# Patient Record
Sex: Female | Born: 1970 | Race: Black or African American | Hispanic: No | Marital: Married | Smoking: Never smoker
Health system: Southern US, Community
[De-identification: ages and names within clinical notes are randomized; demographics above are authoritative.]

## PROBLEM LIST (undated history)

## (undated) DIAGNOSIS — G4710 Hypersomnia, unspecified: Secondary | ICD-10-CM

## (undated) DIAGNOSIS — Z981 Arthrodesis status: Secondary | ICD-10-CM

## (undated) DIAGNOSIS — N3281 Overactive bladder: Secondary | ICD-10-CM

## (undated) DIAGNOSIS — G47419 Narcolepsy without cataplexy: Secondary | ICD-10-CM

## (undated) DIAGNOSIS — F209 Schizophrenia, unspecified: Secondary | ICD-10-CM

## (undated) DIAGNOSIS — G8929 Other chronic pain: Principal | ICD-10-CM

## (undated) DIAGNOSIS — L309 Dermatitis, unspecified: Secondary | ICD-10-CM

## (undated) DIAGNOSIS — D508 Other iron deficiency anemias: Secondary | ICD-10-CM

## (undated) DIAGNOSIS — J45909 Unspecified asthma, uncomplicated: Secondary | ICD-10-CM

## (undated) DIAGNOSIS — F29 Unspecified psychosis not due to a substance or known physiological condition: Secondary | ICD-10-CM

## (undated) DIAGNOSIS — F319 Bipolar disorder, unspecified: Secondary | ICD-10-CM

## (undated) DIAGNOSIS — F1190 Opioid use, unspecified, uncomplicated: Secondary | ICD-10-CM

## (undated) DIAGNOSIS — F250 Schizoaffective disorder, bipolar type: Secondary | ICD-10-CM

## (undated) DIAGNOSIS — G43909 Migraine, unspecified, not intractable, without status migrainosus: Secondary | ICD-10-CM

## (undated) DIAGNOSIS — K219 Gastro-esophageal reflux disease without esophagitis: Secondary | ICD-10-CM

## (undated) DIAGNOSIS — F429 Obsessive-compulsive disorder, unspecified: Secondary | ICD-10-CM

## (undated) DIAGNOSIS — Z5181 Encounter for therapeutic drug level monitoring: Secondary | ICD-10-CM

## (undated) DIAGNOSIS — M545 Low back pain: Principal | ICD-10-CM

## (undated) DIAGNOSIS — K3184 Gastroparesis: Secondary | ICD-10-CM

## (undated) DIAGNOSIS — M1990 Unspecified osteoarthritis, unspecified site: Secondary | ICD-10-CM

## (undated) DIAGNOSIS — F259 Schizoaffective disorder, unspecified: Secondary | ICD-10-CM

## (undated) DIAGNOSIS — T7840XA Allergy, unspecified, initial encounter: Secondary | ICD-10-CM

## (undated) DIAGNOSIS — F329 Major depressive disorder, single episode, unspecified: Secondary | ICD-10-CM

## (undated) DIAGNOSIS — M75100 Unspecified rotator cuff tear or rupture of unspecified shoulder, not specified as traumatic: Secondary | ICD-10-CM

## (undated) DIAGNOSIS — G2581 Restless legs syndrome: Secondary | ICD-10-CM

## (undated) DIAGNOSIS — I10 Essential (primary) hypertension: Secondary | ICD-10-CM

## (undated) DIAGNOSIS — J302 Other seasonal allergic rhinitis: Secondary | ICD-10-CM

## (undated) DIAGNOSIS — F32A Depression, unspecified: Secondary | ICD-10-CM

## (undated) DIAGNOSIS — G4730 Sleep apnea, unspecified: Secondary | ICD-10-CM

## (undated) DIAGNOSIS — F419 Anxiety disorder, unspecified: Secondary | ICD-10-CM

## (undated) HISTORY — PX: WISDOM TOOTH EXTRACTION: SHX21

## (undated) HISTORY — DX: Bipolar disorder, unspecified: F31.9

## (undated) HISTORY — DX: Unspecified rotator cuff tear or rupture of unspecified shoulder, not specified as traumatic: M75.100

## (undated) HISTORY — DX: Dermatitis, unspecified: L30.9

## (undated) HISTORY — DX: Schizoaffective disorder, bipolar type: F25.0

## (undated) HISTORY — DX: Schizoaffective disorder, unspecified: F25.9

## (undated) HISTORY — DX: Depression, unspecified: F32.A

## (undated) HISTORY — DX: Obsessive-compulsive disorder, unspecified: F42.9

## (undated) HISTORY — DX: Low back pain: M54.5

## (undated) HISTORY — DX: Narcolepsy without cataplexy: G47.419

## (undated) HISTORY — DX: Restless legs syndrome: G25.81

## (undated) HISTORY — PX: FRACTURE SURGERY: SHX138

## (undated) HISTORY — PX: SPINE SURGERY: SHX786

## (undated) HISTORY — PX: ROTATOR CUFF REPAIR: SHX139

## (undated) HISTORY — DX: Hypersomnia, unspecified: G47.10

## (undated) HISTORY — DX: Overactive bladder: N32.81

## (undated) HISTORY — DX: Encounter for therapeutic drug level monitoring: Z51.81

## (undated) HISTORY — DX: Unspecified osteoarthritis, unspecified site: M19.90

## (undated) HISTORY — DX: Gastro-esophageal reflux disease without esophagitis: K21.9

## (undated) HISTORY — DX: Migraine, unspecified, not intractable, without status migrainosus: G43.909

## (undated) HISTORY — DX: Essential (primary) hypertension: I10

## (undated) HISTORY — DX: Major depressive disorder, single episode, unspecified: F32.9

## (undated) HISTORY — PX: ANKLE FUSION: SHX881

## (undated) HISTORY — DX: Other chronic pain: G89.29

## (undated) HISTORY — DX: Opioid use, unspecified, uncomplicated: F11.90

## (undated) HISTORY — DX: Unspecified psychosis not due to a substance or known physiological condition: F29

## (undated) HISTORY — DX: Arthrodesis status: Z98.1

## (undated) HISTORY — DX: Allergy, unspecified, initial encounter: T78.40XA

## (undated) HISTORY — PX: SHOULDER SURGERY: SHX246

## (undated) HISTORY — DX: Anxiety disorder, unspecified: F41.9

## (undated) HISTORY — DX: Other iron deficiency anemias: D50.8

---

## 1998-04-28 ENCOUNTER — Inpatient Hospital Stay (HOSPITAL_COMMUNITY): Admission: AD | Admit: 1998-04-28 | Discharge: 1998-04-30 | Payer: Self-pay | Admitting: Obstetrics & Gynecology

## 1998-10-01 ENCOUNTER — Encounter: Payer: Self-pay | Admitting: Emergency Medicine

## 1998-10-01 ENCOUNTER — Encounter: Payer: Self-pay | Admitting: Orthopedic Surgery

## 1998-10-01 ENCOUNTER — Observation Stay (HOSPITAL_COMMUNITY): Admission: EM | Admit: 1998-10-01 | Discharge: 1998-10-03 | Payer: Self-pay | Admitting: Emergency Medicine

## 1999-04-07 ENCOUNTER — Encounter: Admission: RE | Admit: 1999-04-07 | Discharge: 1999-07-06 | Payer: Self-pay | Admitting: Obstetrics and Gynecology

## 1999-05-31 ENCOUNTER — Encounter (INDEPENDENT_AMBULATORY_CARE_PROVIDER_SITE_OTHER): Payer: Self-pay | Admitting: Specialist

## 1999-05-31 ENCOUNTER — Inpatient Hospital Stay (HOSPITAL_COMMUNITY): Admission: AD | Admit: 1999-05-31 | Discharge: 1999-06-02 | Payer: Self-pay | Admitting: Obstetrics & Gynecology

## 1999-07-01 ENCOUNTER — Other Ambulatory Visit: Admission: RE | Admit: 1999-07-01 | Discharge: 1999-07-01 | Payer: Self-pay | Admitting: Obstetrics and Gynecology

## 1999-09-07 HISTORY — PX: ABDOMINAL HYSTERECTOMY: SHX81

## 1999-09-07 HISTORY — PX: VESICOVAGINAL FISTULA CLOSURE W/ TAH: SUR271

## 2000-07-01 ENCOUNTER — Other Ambulatory Visit: Admission: RE | Admit: 2000-07-01 | Discharge: 2000-07-01 | Payer: Self-pay | Admitting: Obstetrics & Gynecology

## 2000-07-06 ENCOUNTER — Encounter: Payer: Self-pay | Admitting: Family Medicine

## 2000-07-06 ENCOUNTER — Encounter: Admission: RE | Admit: 2000-07-06 | Discharge: 2000-07-06 | Payer: Self-pay | Admitting: Family Medicine

## 2000-08-08 ENCOUNTER — Encounter (INDEPENDENT_AMBULATORY_CARE_PROVIDER_SITE_OTHER): Payer: Self-pay | Admitting: Specialist

## 2000-08-08 ENCOUNTER — Observation Stay (HOSPITAL_COMMUNITY): Admission: RE | Admit: 2000-08-08 | Discharge: 2000-08-09 | Payer: Self-pay | Admitting: Obstetrics & Gynecology

## 2001-10-18 ENCOUNTER — Ambulatory Visit (HOSPITAL_COMMUNITY): Admission: RE | Admit: 2001-10-18 | Discharge: 2001-10-18 | Payer: Self-pay | Admitting: Gastroenterology

## 2002-09-06 HISTORY — PX: TUBAL LIGATION: SHX77

## 2002-09-06 HISTORY — PX: OOPHORECTOMY: SHX86

## 2003-05-27 ENCOUNTER — Encounter: Admission: RE | Admit: 2003-05-27 | Discharge: 2003-05-27 | Payer: Self-pay | Admitting: Gastroenterology

## 2003-05-27 ENCOUNTER — Encounter: Payer: Self-pay | Admitting: Gastroenterology

## 2003-06-25 ENCOUNTER — Encounter: Payer: Self-pay | Admitting: Emergency Medicine

## 2003-06-25 ENCOUNTER — Emergency Department (HOSPITAL_COMMUNITY): Admission: EM | Admit: 2003-06-25 | Discharge: 2003-06-25 | Payer: Self-pay | Admitting: Emergency Medicine

## 2003-07-03 ENCOUNTER — Encounter: Admission: RE | Admit: 2003-07-03 | Discharge: 2003-07-03 | Payer: Self-pay | Admitting: Neurology

## 2004-01-19 ENCOUNTER — Encounter: Admission: RE | Admit: 2004-01-19 | Discharge: 2004-01-19 | Payer: Self-pay | Admitting: Neurology

## 2004-11-24 ENCOUNTER — Encounter (INDEPENDENT_AMBULATORY_CARE_PROVIDER_SITE_OTHER): Payer: Self-pay | Admitting: Specialist

## 2004-11-24 ENCOUNTER — Ambulatory Visit (HOSPITAL_COMMUNITY): Admission: RE | Admit: 2004-11-24 | Discharge: 2004-11-24 | Payer: Self-pay | Admitting: Obstetrics & Gynecology

## 2006-10-19 ENCOUNTER — Encounter: Admission: RE | Admit: 2006-10-19 | Discharge: 2006-10-19 | Payer: Self-pay | Admitting: Physician Assistant

## 2007-07-17 ENCOUNTER — Encounter: Admission: RE | Admit: 2007-07-17 | Discharge: 2007-09-06 | Payer: Self-pay | Admitting: Orthopedic Surgery

## 2007-09-07 HISTORY — PX: OTHER SURGICAL HISTORY: SHX169

## 2007-09-08 ENCOUNTER — Encounter: Admission: RE | Admit: 2007-09-08 | Discharge: 2007-10-30 | Payer: Self-pay | Admitting: Orthopedic Surgery

## 2007-09-14 ENCOUNTER — Encounter: Admission: RE | Admit: 2007-09-14 | Discharge: 2007-10-30 | Payer: Self-pay | Admitting: Orthopedic Surgery

## 2007-11-22 ENCOUNTER — Encounter (INDEPENDENT_AMBULATORY_CARE_PROVIDER_SITE_OTHER): Payer: Self-pay | Admitting: Orthopedic Surgery

## 2007-11-23 ENCOUNTER — Inpatient Hospital Stay (HOSPITAL_COMMUNITY): Admission: RE | Admit: 2007-11-23 | Discharge: 2007-11-24 | Payer: Self-pay | Admitting: Orthopedic Surgery

## 2008-02-23 ENCOUNTER — Encounter: Admission: RE | Admit: 2008-02-23 | Discharge: 2008-02-23 | Payer: Self-pay | Admitting: Family Medicine

## 2008-05-12 ENCOUNTER — Encounter: Admission: RE | Admit: 2008-05-12 | Discharge: 2008-05-12 | Payer: Self-pay | Admitting: Sports Medicine

## 2008-07-02 ENCOUNTER — Encounter: Admission: RE | Admit: 2008-07-02 | Discharge: 2008-07-02 | Payer: Self-pay | Admitting: Orthopedic Surgery

## 2008-08-16 ENCOUNTER — Encounter: Admission: RE | Admit: 2008-08-16 | Discharge: 2008-09-05 | Payer: Self-pay | Admitting: Orthopedic Surgery

## 2008-09-10 ENCOUNTER — Encounter: Admission: RE | Admit: 2008-09-10 | Discharge: 2008-10-03 | Payer: Self-pay | Admitting: Orthopedic Surgery

## 2009-08-05 ENCOUNTER — Encounter: Admission: RE | Admit: 2009-08-05 | Discharge: 2009-08-05 | Payer: Self-pay | Admitting: Family Medicine

## 2009-09-06 DIAGNOSIS — Z981 Arthrodesis status: Secondary | ICD-10-CM

## 2009-09-06 HISTORY — DX: Arthrodesis status: Z98.1

## 2009-09-06 HISTORY — PX: LUMBAR FUSION: SHX111

## 2009-09-17 ENCOUNTER — Encounter: Admission: RE | Admit: 2009-09-17 | Discharge: 2009-09-17 | Payer: Self-pay | Admitting: Orthopedic Surgery

## 2009-10-30 ENCOUNTER — Ambulatory Visit: Payer: Self-pay | Admitting: Vascular Surgery

## 2009-11-12 ENCOUNTER — Inpatient Hospital Stay (HOSPITAL_COMMUNITY): Admission: RE | Admit: 2009-11-12 | Discharge: 2009-11-16 | Payer: Self-pay | Admitting: Orthopedic Surgery

## 2009-11-12 ENCOUNTER — Ambulatory Visit: Payer: Self-pay | Admitting: Surgery

## 2009-11-13 ENCOUNTER — Encounter (INDEPENDENT_AMBULATORY_CARE_PROVIDER_SITE_OTHER): Payer: Self-pay | Admitting: Orthopedic Surgery

## 2009-12-03 ENCOUNTER — Ambulatory Visit: Payer: Self-pay | Admitting: Vascular Surgery

## 2010-03-23 ENCOUNTER — Ambulatory Visit: Payer: Self-pay | Admitting: Vascular Surgery

## 2010-06-05 ENCOUNTER — Encounter (HOSPITAL_COMMUNITY): Admission: RE | Admit: 2010-06-05 | Discharge: 2010-06-12 | Payer: Self-pay | Admitting: Internal Medicine

## 2010-06-08 ENCOUNTER — Emergency Department (HOSPITAL_COMMUNITY): Admission: EM | Admit: 2010-06-08 | Discharge: 2010-06-08 | Payer: Self-pay | Admitting: Emergency Medicine

## 2010-06-29 ENCOUNTER — Ambulatory Visit (HOSPITAL_COMMUNITY): Admission: RE | Admit: 2010-06-29 | Discharge: 2010-06-29 | Payer: Self-pay | Admitting: Surgery

## 2010-06-30 ENCOUNTER — Ambulatory Visit (HOSPITAL_COMMUNITY): Admission: RE | Admit: 2010-06-30 | Discharge: 2010-06-30 | Payer: Self-pay | Admitting: Surgery

## 2010-09-24 ENCOUNTER — Encounter
Admission: RE | Admit: 2010-09-24 | Discharge: 2010-09-24 | Payer: Self-pay | Source: Home / Self Care | Attending: Family Medicine | Admitting: Family Medicine

## 2010-09-27 ENCOUNTER — Encounter: Payer: Self-pay | Admitting: Family Medicine

## 2010-11-30 LAB — CBC
HCT: 36.1 % (ref 36.0–46.0)
Hemoglobin: 12.4 g/dL (ref 12.0–15.0)
MCHC: 34.3 g/dL (ref 30.0–36.0)
MCV: 84.5 fL (ref 78.0–100.0)
RBC: 4.27 MIL/uL (ref 3.87–5.11)

## 2010-11-30 LAB — COMPREHENSIVE METABOLIC PANEL
ALT: 14 U/L (ref 0–35)
CO2: 22 mEq/L (ref 19–32)
Calcium: 8.9 mg/dL (ref 8.4–10.5)
Creatinine, Ser: 0.8 mg/dL (ref 0.4–1.2)
GFR calc non Af Amer: >60 mL/min (ref >60)
Glucose, Bld: 97 mg/dL (ref 70–99)

## 2010-11-30 LAB — TYPE AND SCREEN: ABO/RH(D): O POS

## 2010-11-30 LAB — ABO/RH: ABO/RH(D): O POS

## 2011-01-19 NOTE — Op Note (Signed)
[name], [name]                ACCOUNT NO.:  [id number]   MEDICAL RECORD NO.:  [id number]          PATIENT TYPE:  OIB   LOCATION:  1607                         FACILITY:  [facility]   PHYSICIAN:  Marlowe Kays, M.D.  DATE OF BIRTH:  07/31/1971   DATE OF PROCEDURE:  11/22/2007  DATE OF DISCHARGE:                               OPERATIVE REPORT   PREOPERATIVE DIAGNOSIS:  Herniated nucleus pulposus, L4-5, central and  to the left.   POSTOPERATIVE DIAGNOSIS:  Herniated nucleus pulposus, L4-5, central and  to the left.   OPERATION:  Microdiskectomy, L4-5, left.   SURGEON:  Illene Labrador. Aplington, MD.   Threasa HeadsGeorges Lynch. Gioffre, MD.   ANESTHESIA:  General.   PATHOLOGY AND INDICATIONS FOR PROCEDURE:  She has had chronic left  buttock and leg pain with an MRI demonstrating a central and to the left  disk protrusion almost foraminal in type on the axial projections.  She  has failed to respond to three epidural steroid injections and Lyrica as  a chronic narcotic medication prompting her surgery today.  At surgery,  we did obtain more disk material from the lateral portion of the disk  compatible with a foraminal disk problem.  There will be an element of  lateral recess stenosis as well.   PROCEDURE:  Prophylactic antibiotics, satisfactory general anesthesia,  knee-chest position on the [place] frame.  Back was prepped with  DuraPrep and draped in a sterile field, a timeout performed.  We used  three spinal needles on the lateral x-ray, I tentatively located the L4-  5 interspace, Ioban employed, vertical midline incision.  She was 5 feet  3, 236 pounds making dissection more difficult than normal, also she was  somewhat of a free bleeder, she was not on aspirin, which also  complicated the exposure.  When I had worked my way down to the spinous  processes, I tagged one to place a Penfield-4 instrument in the adjacent  interspace and we tentatively located the Kocher clamp as being  on L4  and the Penfield-4 pointed at the L4-5 interspace.  Again, dissecting  additional soft tissue off the lamina of what we perceived as L4 and L5  and placed a self-retaining McCullough retractor.  Even with the longest  foot available, retraction was a little incomplete, but we did have  adequate working room.  To be certain of our location, we took one final  x-ray with a Penfield-4 instrument in the unilateral space in what we  thought was L4-5 and this was confirmed on the x-ray.  With a small  curette, I undermined the superior portion of L5 and the inferior  portion of L4 and then used a combination of 2 and 3 mm Kerrison  rongeurs to remove ligamentum flavum and bone, midway through this  dissection I brought in a microscope for the finer decompression, the L5  nerve root was identified and appeared to be quite tethered, we  continued working laterally and superiorly until we found the L4-5 disk,  which had a large venous plexus around it, which may also be  and a  contributor to the pathology, particularly when she would stand.  Veins  were cauterized with bipolar cautery, the disk was opened with a 15  knife blade, and we simply retrieved a moderate amount of disk material  with Epstein curette and a pituitary, particularly laterally, in keeping  with what appeared somewhat of a foraminal disk on the axial view.  When  I had removed all disk material obtained from the interspace, I tested  with a hockey-stick and the L3-4 foramen was widely patent as was the L4-  5, and the L5 nerve root freely movable.  There was no unusual bleeding  at closure.  The wound was irrigated with sterile saline and Gelfoam  soaked in thrombin was placed in the bed of the wound, around the dura,  the interspace and nerve root.  I carefully removed the self-retaining  retractors, some minimal bleeders were coagulated, and for closure of  the wound I used #1 Vicryl in the fascia leaving several  centimeters  distally for egress of any bleeding, the subcutaneous tissue was closed  with a combination of #1 and #2-0 Vicryl, staples to the skin, Betadine,  Adaptic, dry sterile dressing were applied, she tolerated the procedure  well and was taken to the recovery room in satisfactory condition with  no known complications.           ______________________________  Marlowe Kays, M.D.     JA/MEDQ  D:  11/22/2007  T:  11/22/2007  Job:  [id number]

## 2011-01-19 NOTE — Discharge Summary (Signed)
[name], [name]                ACCOUNT NO.:  [id number]   MEDICAL RECORD NO.:  [id number]          PATIENT TYPE:  INP   LOCATION:  1607                         FACILITY:  [facility]   PHYSICIAN:  Marlowe Kays, M.D.  DATE OF BIRTH:  April 23, 1971   DATE OF ADMISSION:  11/22/2007  DATE OF DISCHARGE:  11/24/2007                               DISCHARGE SUMMARY   ADMITTING DIAGNOSES:  1. Herniated nucleus pulposus L4-5 on the left.  2. Hypertension.  3. Allergies.  4. Gastroesophageal reflux disease.   DISCHARGE DIAGNOSES:  1. Herniated nucleus pulposus L4-5 on the left.  2. Hypertension.  3. Allergies.  4. Gastroesophageal reflux disease.   OPERATION:  On November 22, 2007, the patient underwent microdiskectomy, L4-  5 on the left.   BRIEF HISTORY:  This 40 year old black female was seen by [place] for  continued progressive problems concerning pain into her low back with  radiation into the left lower extremity.  She had a considerable amount  of pain and discomfort in the left lower extremity.  After risks and  benefits of surgery, the patient underwent the above procedure.   COURSE IN THE HOSPITAL:  The patient tolerated the surgical procedure  quite well.  She was very slow in early ambulation due to the soreness  in her low back.  She is a very heavy lady and was having some  difficulty coming to a standing position.  A walker was supplied and she  did better.  It was decided to keep her another day for her own safety  and observation.  She did have a moderate amount of drainage from her  dressing.  The dressing was changed.  The wound was dry and dressing  applied, stayed dry postoperatively.  She had good strength in the left  lower extremity and had less pain postoperatively than she had  preoperatively.  She did have transient discomfort in the left lower  extremity.  She was essentially afebrile.  Once she was weaned from her  IV antibiotics as well as IV analgesics, it was  decided she could be  maintained in her home environment.  Physical therapy agreed that she  would be safe.  She received instructions as well as a booklet on the  care of her back.   Laboratory values in the hospital showed a hemoglobin of 12.0,  hematocrit of 35.4.  chest x-ray showed no active cardiopulmonary  disease.  Electrocardiogram showed sinus bradycardia, otherwise normal  ECG.   CONDITION ON DISCHARGE:  Improved, stable.   PLAN:  She is to continue with the same diet she had at home; however,  we do recommend a weight reduction diet.  Dr. Simonne Come spoke with her  on the day of discharge concerning that.  Return to see [place] 2 weeks after  date of surgery.  Use a dry dressing as needed.   Continue with her home medications, which are:  1. Seroquel XR 400 mg 1 p.m.  2. Wellbutrin 300 mg q.a.m.  3. Vesicare 5 mg q.a.m.  4. Topamax 200 mg nightly.  5. Hydrochlorothiazide 25 mg  q.a.m.  6. Omeprazole 40 mg b.i.d.  7. Hydrocodone p.r.n.  8. Relpax 40 mg p.r.n. migraine.  9. Trazodone 100 mg as needed.  10.Lyrica 75 mg one daily.  11.We wrote for pain Percocet 5/325 mg #40, 1-2 q.4-6h. p.r.n. pain.  12.Robaxin 500 mg #30 with three refills, one q.6 h. p.r.n. muscle      spasm.   She may shower 4 days after surgery, use dry dressings p.r.n.  Return to  see Dr. Simonne Come in 2 weeks.      Dooley L. Cherlynn June.    ______________________________  Marlowe Kays, M.D.    DLU/MEDQ  D:  11/24/2007  T:  11/24/2007  Job:  [id number]

## 2011-01-19 NOTE — Consult Note (Signed)
NEW PATIENT CONSULTATION   [name], [name]  DOB:  Nov 25, 1970                                       10/30/2009  QIONG#:[id number]   I saw the patient in the office today in consultation concerning her  upcoming ALIF procedure by Dr. Shon Baton.  This is a pleasant 40 year old  woman who has a long history of low back pain.  She had a ruptured disk  in 2009 and underwent repair via a posterior approach.  She initially  did well for several months but then her pain gradually returned and she  has had significant low back pain for years.  Her pain is exacerbated by  any activity at all.  She has tried multiple things to relieve her pain  including physical therapy, pain medicines, Lyrica, without any success.  She has undergone an extensive workup by Dr. Shon Baton and he has  recommended anterior lumbar interbody fusion at the L5-S1 level.  We  have been asked to provide anterior retroperitoneal exposure.   PAST MEDICAL HISTORY:  Is significant for hypertension which has been  well-controlled on her current medications.  In addition, she has  hypercholesterolemia which has also been well-controlled by her primary  care physician, Dr. Clovis Riley.  She denies any history of diabetes,  history of previous myocardial infarction, history of congestive heart  failure or history of COPD.   PAST SURGICAL HISTORY:  Significant for previous hysterectomy and  removal of her right ovary and fallopian tube both of which were done  laparoscopically.  She has also had previous surgery on her right ankle.   FAMILY HISTORY:  There is no history of premature cardiovascular  disease.   SOCIAL HISTORY:  She is married.  She has three children.  She does not  smoke tobacco.   MEDICATIONS:  Are documented on the medical history form in her chart.   ALLERGIES:  She has no allergies except that aspirin causes a GI upset.   REVIEW OF SYSTEMS:  GENERAL:  She has had some weight gain and she  is  214 pounds and 5 feet 2 inches tall.  She has had no fever or chills.  CARDIOVASCULAR:  She has had no chest pain, chest pressure, palpitations  or arrhythmias.  She has had no claudication, rest pain or nonhealing  ulcers.  She has had no history of stroke, TIAs.  She has had no history  of DVT or phlebitis.  PULMONARY:  She has had no productive cough, bronchitis, asthma or  wheezing.  GI:  She has had reflux in the past.  She has had no recent change in  her bowel habits.  GU:  She has had occasional urinary frequency.  NEUROLOGIC:  She has occasional dizziness and occasional headaches.  She  has had no blackouts or seizures.  MUSCULOSKELETAL:  She has had some muscle pain and arthritic pain.  PSYCHIATRIC:  She has had occasional depression.  ENT:  She has had some problems with her vision with respect to blurred  vision.  She has had no change in her hearing.  Hematologic and integumentary review of systems is unremarkable.   PHYSICAL EXAMINATION:  General:  This is a pleasant 40 year old woman  who appears her stated age.  Vital signs:  Her blood pressure is 123/83,  heart rate is 80, respiratory rate 16.  She  is moderately obese.  HEENT:  Unremarkable.  Lungs:  Clear bilaterally to auscultation without rales,  rhonchi or wheezing.  Cardiovascular:  I do not detect any carotid  bruits.  She has a regular rate and rhythm without murmur appreciated.  She has no significant peripheral edema.  She has palpable radial,  femoral and dorsalis pedis pulses bilaterally.  I cannot palpate  posterior tibial pulses.  Abdomen:  Is somewhat obese.  She is nontender  with normal pitched bowel sounds and no masses are appreciated.  Musculoskeletal:  There are no major deformities or cyanosis.  Neurological:  She has no focal weakness or paresthesias.  Skin:  There  are no significant ulcers or rashes.   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  I also  performed an arterial Doppler study in the office today  which shows biphasic Doppler signals in the anterior tibial and  posterior tibial positions bilaterally.   I have explained our role in anterior closure for her upcoming ALIF.  We  have discussed the indications for surgery and the potential  complications including but not limited to bleeding, arterial or venous  injury or thrombosis, leg swelling and nerve injury.  All of her  questions were answered and she is agreeable to proceed.  I think she is  an acceptable risk for this approach despite her obesity but certainly  this does make it more of a challenge and increases her risk slightly.  Dr. Myra Gianotti is actually scheduled to provide exposure for Dr. Shon Baton on  March 9 and the patient is aware of this.  All of her questions were  answered.  She is agreeable to proceed.     Di Kindle. Edilia Bo, M.D.  Electronically Signed   CSD/MEDQ  D:  10/30/2009  T:  10/31/2009  Job:  [id number]   cc:   Alvy Beal, MD  L. Lupe Carney, M.D.

## 2011-01-22 NOTE — Op Note (Signed)
[facility]  Patient:    [name], [name]                         MRN: [id number] Proc. Date: 08/08/00 Attending:  Caralyn Guile. Arlyce Dice, M.D.                           Operative Report  PREOPERATIVE DIAGNOSES:       Menorrhagia, dysmenorrhea, and chronic right lower quadrant pain.  POSTOPERATIVE DIAGNOSIS:      Menorrhagia, dysmenorrhea, and chronic right lower quadrant pain.  OPERATION:                    Diagnostic laparoscopy and total vaginal                               hysterectomy.  SURGEON:                      Richard D. Arlyce Dice, M.D.  ASSISTANT:                    Luvenia Redden, M.D.  ANESTHESIA:                   General endotracheal anesthesia.  ESTIMATED BLOOD LOSS:         100 cc.  FINDINGS:                     Normal-appearing tubes and ovaries.  Uterus appeared normal.  Her uterus was free, top normal size, but otherwise appeared normal.  INDICATIONS:                  A 40 year old gravida 3, para 3, with a one-year history of heavy and painful menstrual periods which were not relieved by conservative measures.  In addition, the patient complained of chronic intermittent right lower quadrant pain.  The options were discussed with the patient preoperatively.  The original surgery that was suggested was a laparoscopy, a D&C, hysteroscopy, and endometrial ablation.  The patient reviewed and requested that we proceed with vaginal hysterectomy for permanent resolution of her vaginal bleeding.  In addition, diagnostic laparoscopy will be performed prior to surgery to ensure that there is no pathology in the right adnexa.  DESCRIPTION OF PROCEDURE:     The patient was taken to the operating room, and general anesthesia was induced.  She was then placed in the dorsolithotomy position.  The abdomen and perineum were prepped and draped in sterile fashion.  A Veress needle was introduced through the umbilicus, and pneumoperitoneum was  created.  A 5 mm scope was introduced into the peritoneal cavity, and the pelvis was viewed with the findings noted above.  Because there was no adnexal pathology, the procedure was then continued vaginally. The cervix was grasped with a Christella Hartigan tenaculum, and the paracervical tissues were infiltrated with 0.5% Marcaine with 1:200,000 epinephrine.  The cervix was then circumcised.  The anterior mucosa was dissected free, and the mucosa was advanced until the peritoneum was seen in the anterior cul-de-sac.  The posterior cul-de-sac was entered sharply.  The uterosacral ligaments were clamped, cut, and ligated bilaterally.  The cardinal ligaments were controlled in a similar fashion.  The anterior cul-de-sac was then entered, and the uterine arteries were clamped, cut, and ligated.  The uterus was  delivered posteriorly.  The utero-ovarian anastomosis was clamped and double ligated. The uterus and cervix were then removed.  The vaginal cuff posteriorly was closed with a running interlocking Vicryl suture from uterosacral to uterosacral.  The cul-de-sac was then closed with an internal stitch from uterosacral to uterosacral, and the peritoneum was closed with a pursestring suture.  The anterior cuff was closed with a figure-of-eight suture.  The surgeon then regloved.  Pneumoperitoneum was recreated, and the laparoscope was used to ensure there were no bleeding pedicles.  The procedure was then terminated.  The 5 mm ports were then closed with Dermabond and did not require any further suturing.  The procedure was then terminated.  The patient left the operating room in good condition. DD:  08/08/00 TD:  08/08/00 Job: [id number]

## 2011-01-22 NOTE — H&P (Signed)
[facility]  Patient:    [name], [name]                         MRN: [id number] Adm. Date:  08/08/00 Attending:  Caralyn Guile. Arlyce Dice, M.D.                         History and Physical  PREOPERATIVE DIAGNOSIS:       1. Menorrhagia.                               2. Dysmenorrhea.                               3. Chronic right lower quadrant pain.  HISTORY OF PRESENT ILLNESS:   This is a 40 year old, gravida 3, para 3, who has a two to three-year history of chronic right lower quadrant pain which has bothered her intermittently.  In addition, the patient over the last year has had heavy vaginal bleeding which she describes as using 80 pads in a week, tends to control the bleeding with nonsteroidal anti-inflammatory drugs, and oral contraceptives have failed to alleviate the problem.  The options were discussed with the patient which included D&C, hysteroscopy, and endometrial ablation versus vaginal hysterectomy, and the patient opted to have vaginal hysterectomy.  In addition, due to the patients complaint of right lower quadrant pain, the decision was made to proceed with a laparoscopy prior to surgery to ensure that there was no adnexal pathology.  PAST SURGICAL HISTORY:        Postpartum tubal ligation.  PAST MEDICAL HISTORY:         She has no medical problems.  Has never had a blood transfusion.  ALLERGIES:                    No known allergies.  FAMILY HISTORY:               Negative for heart disease, diabetes, and breast cancer.  SOCIAL HISTORY:               She does not use street drugs.  She does not smoke cigarettes.  REVIEW OF SYSTEMS:            Negative in detail.  PHYSICAL EXAMINATION:  HEENT:                        Ears, nose, and throat are normal.  Throat was supple without thyromegaly.  BREASTS:                      Without masses.  CARDIOVASCULAR:               Regular sinus rhythm without murmurs or gallops.  CHEST:                         Lungs are clear.  ABDOMEN:                      Soft without hepatosplenomegaly.  PELVIC:                       Uterus is retroverted, top normal size.  No adnexal masses palpable.  Vagina and vulva are normal.  ADMISSION DIAGNOSES:          1. Menorrhagia.                               2. Dysmenorrhea.                               3. Chronic right lower quadrant pain.  PLAN:                         Laparoscopy followed by vaginal hysterectomy. DD:  08/08/00 TD:  08/08/00 Job: [id number]

## 2011-01-22 NOTE — Procedures (Signed)
[facility]  Patient:    [name], [name] Visit Number: [id number] MRN: [id number]          Service Type: Attending:  Verlin Grills, M.D. Dictated by:   Verlin Grills, M.D. Proc. Date: 10/18/01   CC:         Desma Maxim, M.D.   Procedure Report  PROCEDURE:  Colonoscopy.  REFERRING PHYSICIAN:  Desma Maxim, M.D.  PROCEDURE INDICATION:  [name] is a 40 year old female born Aug 02, 1971.  She has intermittent, painless hematochezia.  I discussed with [name] the complications associated with colonoscopy including intestinal bleeding and intestinal perforation.  [name] has signed the operative permit.  ENDOSCOPIST:  Verlin Grills, M.D.  PREMEDICATION:  Versed 7 mg, Demerol 50 mg.  ENDOSCOPE:  Olympus pediatric colonoscope.  DESCRIPTION OF PROCEDURE:  After obtaining informed consent, [name] was placed in the left lateral decubitus position.  I administered intravenous Demerol and intravenous Versed to achieve conscious sedation for the procedure.  The patients blood pressure, oxygen saturation, and cardiac rhythm were monitored throughout the procedure and documented in the medical record.  Anal inspection was normal.  Digital rectal examination was normal.  The Olympus pediatric video colonoscope was introduced into the rectum and easily advanced to the cecum.  Colonic preparation for the exam today was satisfactory.  RECTUM:  Normal.  Moderate sized but nonbleeding internal hemorrhoids are present.  SIGMOID COLON AND DESCENDING COLON:  Normal.  SPLENIC FLEXURE:  Normal.  TRANSVERSE COLON:  Normal.  HEPATIC FLEXURE:  Normal.  ASCENDING COLON:  Normal.  CECUM AND ILEOCECAL VALVE:  Normal.  ASSESSMENT:  Moderate sized nonbleeding internal hemorrhoids; otherwise normal proctocolonoscopy to the cecum.  I do not see any active bleeding from the anus, rectum, or colon.  I suspect she  probably has intermittent bleeding from internal hemorrhoids which do not appear large enough to require banding or sclerosis.  I will simply prescribe Preparation H suppositories. Dictated by:   Verlin Grills, M.D. Attending:  Verlin Grills, M.D. DD:  10/18/01 TD:  10/18/01 Job: [id number]

## 2011-01-22 NOTE — Op Note (Signed)
[name], [name]         ACCOUNT NO.:  [id number]   MEDICAL RECORD NO.:  [id number]          PATIENT TYPE:  AMB   LOCATION:  SDC                           FACILITY:  [facility]   PHYSICIAN:  Ilda Mori, M.D.   DATE OF BIRTH:  02/17/1971   DATE OF PROCEDURE:  11/24/2004  DATE OF DISCHARGE:                                 OPERATIVE REPORT   PREOPERATIVE DIAGNOSIS:  Chronic right lower quadrant pain.   POSTOPERATIVE DIAGNOSES:  1.  Chronic right lower quadrant pain.  2.  Hemorrhagic right paratubal ovarian cyst.   PROCEDURE:  Laparoscopic right salpingo-oophorectomy.   SURGEON:  Richard D. Arlyce Dice, M.D.   ASSISTANT:  Luvenia Redden, M.D.   ANESTHESIA:  General endotracheal.   ESTIMATED BLOOD LOSS:  Minimal.   FINDINGS:  A 3 cm hemorrhagic inflammatory cyst in the right adnexa.  Normal-  appearing right ovary, normal-appearing left ovary and left tube.  No pelvic  adhesions.   SPECIMENS:  Right tube and ovary sent to pathology.   INDICATIONS:  This is a 40 year old gravida 3, para 3, female with over five  years of chronic right lower quadrant pain.  Diagnostic laparoscopy in  December 2001 that was done at the same time as a vaginal hysterectomy  showed normal-appearing right and left adnexa.  The patient's pain persisted  over the last five years, severe enough at times to interfere with her  normal activities.  Examination of musculoskeletal system was negative.  She  has no significant GI problems. Ultrasound done two weeks ago revealed a  complex mass in the right adnexa and due to the cyst in the right adnexa and  the persistence of the patient's pain, a decision was made to proceed with  right salpingo-oophorectomy.   PROCEDURE:  The patient was brought to the operating room, placed in the  supine position.  General endotracheal anesthesia was induced.  The patient  was then placed in the dorsal lithotomy position and the abdomen and  perineum were prepped and  draped in a sterile fashion and the bladder was  catheterized.  Incision was made in the base of the umbilicus and carried  down to the fascia, which was then opened sharply and tagged with 0 Vicryl  suture.  The peritoneum was then entered sharply and a Hasson cannula was  placed into the peritoneum.  Pneumoperitoneum was created and the  laparoscope was introduced.  Under direct visualization, accessory  instruments were placed through the right and left lower quadrants.  The  pelvis was viewed with the findings noted above.  The infundibulopelvic  ligament was then elevated.  The ureter was noted to be peristalsing  normally well below the infundibulopelvic ligament.  The infundibulopelvic  ligament was ten cauterized and cut and the adnexa were freed.  The  hemostasis was obtained with bipolar cautery.  The Endobag was then placed  through the umbilical port.  A 5 mm scope was placed through the accessory  port in the right lower quadrant.  The specimen was placed in the Endobag  and was removed in the bag through the umbilical incision.  The  pursestring  Vicryl suture that had been placed during the insertion of the cannula was  then tied.  The skin in the umbilical site was closed with subcuticular 4-0  Dexon suture.  The gas was allowed to escape and the lower instruments were  removed, and these incisions were closed with Dermabond.  The patient  tolerated the procedure well and left the operating room in good condition.      RK/MEDQ  D:  11/24/2004  T:  11/24/2004  Job:  [id number]

## 2011-05-31 LAB — BASIC METABOLIC PANEL
BUN: 6
Chloride: 103
Creatinine, Ser: 0.83

## 2011-05-31 LAB — HEMOGLOBIN AND HEMATOCRIT, BLOOD: Hemoglobin: 12

## 2011-06-03 ENCOUNTER — Encounter: Payer: Self-pay | Admitting: Pulmonary Disease

## 2011-06-08 ENCOUNTER — Encounter: Payer: Self-pay | Admitting: Pulmonary Disease

## 2011-06-09 ENCOUNTER — Ambulatory Visit (INDEPENDENT_AMBULATORY_CARE_PROVIDER_SITE_OTHER): Payer: Federal, State, Local not specified - PPO | Admitting: Pulmonary Disease

## 2011-06-09 ENCOUNTER — Other Ambulatory Visit: Payer: Federal, State, Local not specified - PPO

## 2011-06-09 ENCOUNTER — Other Ambulatory Visit: Payer: Self-pay | Admitting: Pulmonary Disease

## 2011-06-09 ENCOUNTER — Encounter: Payer: Self-pay | Admitting: Pulmonary Disease

## 2011-06-09 VITALS — BP 108/82 | HR 95 | Temp 98.6°F | Ht 62.0 in | Wt 221.8 lb

## 2011-06-09 DIAGNOSIS — R0989 Other specified symptoms and signs involving the circulatory and respiratory systems: Secondary | ICD-10-CM

## 2011-06-09 DIAGNOSIS — R0609 Other forms of dyspnea: Secondary | ICD-10-CM

## 2011-06-09 DIAGNOSIS — J309 Allergic rhinitis, unspecified: Secondary | ICD-10-CM

## 2011-06-09 DIAGNOSIS — R0600 Dyspnea, unspecified: Secondary | ICD-10-CM

## 2011-06-09 DIAGNOSIS — R0602 Shortness of breath: Secondary | ICD-10-CM

## 2011-06-09 NOTE — Progress Notes (Signed)
  Subjective:    Patient ID: [name], female    DOB: [birth year], 40 y.o.   MRN: [id number]  HPI 39/F never smoker for evaluation of dyspnea x few months. This is present constantly But sometimes worsened by activity.  She feels her chest heavy, raspy , as if she had inhaled smoke. She denies seasonal allergies or diurnal variation. Taking long deep breaths seems to help. Occasional wheezing as been noted. CXR nml. Spirometry did not show airway obstruction. She has chronic headaches ([facility]), chronic pain on opana (ramos) & psychosis Nolen Mu) . She takes pramipexole for restless legs. She has breakthrough reflux symptoms on omeprazole. She denies sinus congestion.  Symbicort trial did not help  Review of Systems  Constitutional: Negative for fever and unexpected weight change.  HENT: Positive for sore throat and trouble swallowing. Negative for ear pain, nosebleeds, congestion, rhinorrhea, sneezing, dental problem, postnasal drip and sinus pressure.   Eyes: Negative for redness and itching.  Respiratory: Positive for cough and shortness of breath. Negative for chest tightness and wheezing.   Cardiovascular: Positive for palpitations and leg swelling.  Gastrointestinal: Negative for nausea and vomiting.  Genitourinary: Negative for dysuria.  Musculoskeletal: Positive for joint swelling.  Skin: Negative for rash.  Neurological: Positive for headaches.  Hematological: Does not bruise/bleed easily.  Psychiatric/Behavioral: Positive for dysphoric mood. The patient is nervous/anxious.        Objective:   Physical Exam Gen. Pleasant, obese, in no distress, normal affect ENT - no lesions, no post nasal drip, class 2-3 airway, swollen turbinates Neck: No JVD, no thyromegaly, no carotid bruits Lungs: no use of accessory muscles, no dullness to percussion, decreased without rales or rhonchi  Cardiovascular: Rhythm regular, heart sounds  normal, no murmurs or gallops, no  peripheral edema Abdomen: soft and non-tender, no hepatosplenomegaly, BS normal. Musculoskeletal: No deformities, no cyanosis or clubbing Neuro:  alert, non focal, no tremors        Assessment & Plan:

## 2011-06-09 NOTE — Patient Instructions (Signed)
Lung function test was normal Treat for reflux  Allergy blood test - RAST Nasal spray -each nare at bedtime

## 2011-06-11 ENCOUNTER — Telehealth: Payer: Self-pay | Admitting: Pulmonary Disease

## 2011-06-11 LAB — ALLERGY FULL PROFILE

## 2011-06-11 MED ORDER — DEXLANSOPRAZOLE 60 MG PO CPDR: 60.0000 mg | DELAYED_RELEASE_CAPSULE | Freq: Every day | ORAL | Status: DC

## 2011-06-11 NOTE — Telephone Encounter (Signed)
I spoke with pt and she states her reflux medication was not sent to the pharmacy. Per Ch[facility] Heck send Dr. Vassie Loll wanted her on dexilant 60 mg. Pt also states she is running out of her symbicort sample given to her by another physician and wanted to if Dr. Vassie Loll wanted her to continue on this and if so she will need an rx. Pt aware RA is out of the office today. Please advise Dr. Vassie Loll. Thanks  Carver Fila, CMA

## 2011-06-12 NOTE — Assessment & Plan Note (Signed)
Unclear cause - doubt asthma, given lack of response to therapeutic trial & nml spirometry Will treat for GERD with dexilant  - long acting PPI Nasal steroid qhs If no improvement, consider echo for LV/RV function No risk factors for PE here - explore anxiety as cause ?

## 2011-06-12 NOTE — Telephone Encounter (Signed)
OK to stop symbicort once done. dexilant Rx sent ?

## 2011-06-14 NOTE — Telephone Encounter (Signed)
Pt aware to stop the symbicort once done and rx for dexilant was already sent

## 2011-06-24 LAB — ALLERGY FULL PROFILE
Allergen, D pternoyssinus,d7: 0.19 kU/L (ref <0.35)
Allergen,Goose feathers, e70: <0.1 kU/L (ref <0.35)
Alternaria Alternata: <0.1 kU/L (ref <0.35)
Aspergillus fumigatus, IgG: <0.1 kU/L (ref <0.35)
Bahia Grass: <0.1 kU/L (ref <0.35)
Bermuda Grass: <0.1 kU/L (ref <0.35)
Box Elder IgE: <0.1 kU/L (ref <0.35)
Candida Albicans: 0.28 kU/L (ref <0.35)
Cat Dander: 0.4 kU/L — ABNORMAL HIGH (ref <0.35)
Common Ragweed: 0.12 kU/L (ref <0.35)
Curvularia lunata: <0.1 kU/L (ref <0.35)
D. farinae: 0.25 kU/L (ref <0.35)
Dog Dander: <0.1 kU/L (ref <0.35)
Elm IgE: <0.1 kU/L (ref <0.35)
Fescue: <0.1 kU/L (ref <0.35)
G005 Rye, Perennial: <0.1 kU/L (ref <0.35)
G009 Red Top: <0.1 kU/L (ref <0.35)
Goldenrod: <0.1 kU/L (ref <0.35)
Helminthosporium halodes: <0.1 kU/L (ref <0.35)
House Dust Hollister: 0.15 kU/L (ref <0.35)
IgE (Immunoglobulin E), Serum: 79 IU/mL (ref 0.0–180.0)
Lamb's Quarters: <0.1 kU/L (ref <0.35)
Oak: <0.1 kU/L (ref <0.35)
Plantain: <0.1 kU/L (ref <0.35)
Stemphylium Botryosum: <0.1 kU/L (ref <0.35)
Sycamore Tree: 0.11 kU/L (ref <0.35)
Timothy Grass: <0.1 kU/L (ref <0.35)

## 2011-06-28 NOTE — Progress Notes (Signed)
Quick Note:  I informed pt of RA's findings and recommendations. Pt verbalized understanding  ______ 

## 2011-08-04 ENCOUNTER — Other Ambulatory Visit: Payer: Self-pay | Admitting: Pulmonary Disease

## 2011-08-04 MED ORDER — DEXLANSOPRAZOLE 60 MG PO CPDR: 60.0000 mg | DELAYED_RELEASE_CAPSULE | Freq: Every day | ORAL | Status: DC

## 2011-09-03 ENCOUNTER — Telehealth: Payer: Self-pay | Admitting: Pulmonary Disease

## 2011-09-03 NOTE — Telephone Encounter (Signed)
Member ID # [id number]. Dexilant has been APPROVED from 07/04/11 to 09/02/12. Pt has failed Omeprazole which is a covered alternative. Patient and pharmacy notified.

## 2011-10-08 ENCOUNTER — Other Ambulatory Visit: Payer: Self-pay | Admitting: Orthopedic Surgery

## 2011-10-08 DIAGNOSIS — M545 Low back pain, unspecified: Secondary | ICD-10-CM

## 2011-10-11 ENCOUNTER — Ambulatory Visit
Admission: RE | Admit: 2011-10-11 | Discharge: 2011-10-11 | Disposition: A | Discharge status: Home / Self Care | Payer: Federal, State, Local not specified - PPO | Source: Ambulatory Visit | Attending: Orthopedic Surgery | Admitting: Orthopedic Surgery

## 2011-10-11 VITALS — BP 118/64 | HR 74

## 2011-10-11 DIAGNOSIS — M545 Low back pain, unspecified: Secondary | ICD-10-CM

## 2011-10-11 MED ORDER — ONDANSETRON HCL 4 MG/2ML IJ SOLN
4.0000 mg | Freq: Once | INTRAMUSCULAR | Status: AC
  Administered 2011-10-11: 4 mg via INTRAMUSCULAR

## 2011-10-11 MED ORDER — DIAZEPAM 5 MG PO TABS
10.0000 mg | ORAL_TABLET | Freq: Once | ORAL | Status: AC
  Administered 2011-10-11: 10 mg via ORAL

## 2011-10-11 MED ORDER — IOHEXOL 180 MG/ML  SOLN
15.0000 mL | Freq: Once | INTRAMUSCULAR | Status: AC | PRN
  Administered 2011-10-11: 15 mL via INTRATHECAL

## 2011-10-11 MED ORDER — ONDANSETRON HCL 4 MG/2ML IJ SOLN: 4.0000 mg | Freq: Four times a day (QID) | INTRAMUSCULAR | Status: DC | PRN

## 2011-10-11 MED ORDER — MEPERIDINE HCL 100 MG/ML IJ SOLN
75.0000 mg | Freq: Once | INTRAMUSCULAR | Status: AC
  Administered 2011-10-11: 75 mg via INTRAMUSCULAR

## 2011-10-11 NOTE — Progress Notes (Addendum)
Pt is waiting for ride to return. Dd  1411 daughter is just leaving school and is on the way to pick up her Mom.dd

## 2011-11-23 ENCOUNTER — Other Ambulatory Visit: Payer: Self-pay | Admitting: Pulmonary Disease

## 2011-11-23 MED ORDER — DEXLANSOPRAZOLE 60 MG PO CPDR: 60.0000 mg | DELAYED_RELEASE_CAPSULE | Freq: Every day | ORAL | Status: DC

## 2012-03-14 ENCOUNTER — Other Ambulatory Visit: Payer: Self-pay | Admitting: Physician Assistant

## 2012-03-14 DIAGNOSIS — Z1231 Encounter for screening mammogram for malignant neoplasm of breast: Secondary | ICD-10-CM

## 2012-03-28 ENCOUNTER — Ambulatory Visit
Admission: RE | Admit: 2012-03-28 | Discharge: 2012-03-28 | Disposition: A | Discharge status: Home / Self Care | Payer: Federal, State, Local not specified - PPO | Source: Ambulatory Visit | Attending: Physician Assistant | Admitting: Physician Assistant

## 2012-03-28 DIAGNOSIS — Z1231 Encounter for screening mammogram for malignant neoplasm of breast: Secondary | ICD-10-CM

## 2012-04-20 ENCOUNTER — Encounter (HOSPITAL_COMMUNITY): Payer: Self-pay | Admitting: Emergency Medicine

## 2012-04-20 ENCOUNTER — Observation Stay (HOSPITAL_COMMUNITY)
Admission: EM | Admit: 2012-04-20 | Discharge: 2012-04-20 | Disposition: A | Discharge status: Home / Self Care | Payer: Federal, State, Local not specified - PPO | Attending: Emergency Medicine | Admitting: Emergency Medicine

## 2012-04-20 ENCOUNTER — Observation Stay (HOSPITAL_COMMUNITY): Payer: Federal, State, Local not specified - PPO

## 2012-04-20 DIAGNOSIS — R5383 Other fatigue: Secondary | ICD-10-CM | POA: Insufficient documentation

## 2012-04-20 DIAGNOSIS — R42 Dizziness and giddiness: Secondary | ICD-10-CM | POA: Insufficient documentation

## 2012-04-20 DIAGNOSIS — R112 Nausea with vomiting, unspecified: Secondary | ICD-10-CM | POA: Insufficient documentation

## 2012-04-20 DIAGNOSIS — R197 Diarrhea, unspecified: Principal | ICD-10-CM | POA: Insufficient documentation

## 2012-04-20 DIAGNOSIS — R5381 Other malaise: Secondary | ICD-10-CM | POA: Insufficient documentation

## 2012-04-20 DIAGNOSIS — R109 Unspecified abdominal pain: Secondary | ICD-10-CM | POA: Insufficient documentation

## 2012-04-20 DIAGNOSIS — R509 Fever, unspecified: Secondary | ICD-10-CM | POA: Insufficient documentation

## 2012-04-20 DIAGNOSIS — I10 Essential (primary) hypertension: Secondary | ICD-10-CM | POA: Insufficient documentation

## 2012-04-20 LAB — HEPATIC FUNCTION PANEL
ALT: 14 U/L (ref 0–35)
Alkaline Phosphatase: 75 U/L (ref 39–117)
Total Protein: 8.1 g/dL (ref 6.0–8.3)

## 2012-04-20 LAB — BASIC METABOLIC PANEL
BUN: 8 mg/dL (ref 6–23)
CO2: 26 mEq/L (ref 19–32)
Chloride: 101 mEq/L (ref 96–112)
Creatinine, Ser: 0.71 mg/dL (ref 0.50–1.10)

## 2012-04-20 LAB — CBC
HCT: 38 % (ref 36.0–46.0)
MCV: 77.9 fL — ABNORMAL LOW (ref 78.0–100.0)
RBC: 4.88 MIL/uL (ref 3.87–5.11)
WBC: 17.3 10*3/uL — ABNORMAL HIGH (ref 4.0–10.5)

## 2012-04-20 MED ORDER — ONDANSETRON 8 MG PO TBDP: 8.0000 mg | ORAL_TABLET | Freq: Three times a day (TID) | ORAL | Status: AC | PRN

## 2012-04-20 MED ORDER — SODIUM CHLORIDE 0.9 % IV SOLN: Freq: Once | INTRAVENOUS | Status: DC

## 2012-04-20 MED ORDER — IOHEXOL 300 MG/ML  SOLN
100.0000 mL | Freq: Once | INTRAMUSCULAR | Status: AC | PRN
  Administered 2012-04-20: 100 mL via INTRAVENOUS

## 2012-04-20 MED ORDER — SODIUM CHLORIDE 0.9 % IV BOLUS (SEPSIS)
1000.0000 mL | Freq: Once | INTRAVENOUS | Status: AC
  Administered 2012-04-20: 1000 mL via INTRAVENOUS

## 2012-04-20 MED ORDER — LORAZEPAM 2 MG/ML IJ SOLN
1.0000 mg | Freq: Once | INTRAMUSCULAR | Status: AC
  Administered 2012-04-20: 1 mg via INTRAVENOUS
  Filled 2012-04-20: qty 1

## 2012-04-20 MED ORDER — PROMETHAZINE HCL 25 MG/ML IJ SOLN
25.0000 mg | Freq: Once | INTRAMUSCULAR | Status: AC
  Administered 2012-04-20: 25 mg via INTRAVENOUS
  Filled 2012-04-20: qty 1

## 2012-04-20 MED ORDER — ONDANSETRON 8 MG/NS 50 ML IVPB: 8.0000 mg | Freq: Once | INTRAVENOUS | Status: DC

## 2012-04-20 MED ORDER — ONDANSETRON 8 MG/NS 50 ML IVPB
8.0000 mg | INTRAVENOUS | Status: AC
  Administered 2012-04-20: 8 mg via INTRAVENOUS
  Filled 2012-04-20: qty 8

## 2012-04-20 MED ORDER — ACETAMINOPHEN 325 MG PO TABS
650.0000 mg | ORAL_TABLET | Freq: Once | ORAL | Status: AC
  Administered 2012-04-20: 650 mg via ORAL
  Filled 2012-04-20: qty 2

## 2012-04-20 MED ORDER — PANTOPRAZOLE SODIUM 40 MG IV SOLR
40.0000 mg | Freq: Once | INTRAVENOUS | Status: AC
  Administered 2012-04-20: 40 mg via INTRAVENOUS
  Filled 2012-04-20: qty 40

## 2012-04-20 MED ORDER — IOHEXOL 300 MG/ML  SOLN: 20.0000 mL | INTRAMUSCULAR | Status: AC

## 2012-04-20 NOTE — ED Notes (Signed)
Patient given ice chips. States she is still dry heaving and is also complaining of headache. Offered tylenol. States she wants to hold off on tylenol. Will continue to monitor patient

## 2012-04-20 NOTE — ED Notes (Signed)
Pt here with body aches and n/v/d that started last night

## 2012-04-20 NOTE — ED Provider Notes (Signed)
Patient placed in CDU pending completion of diagnostic testing in the evaluation of N/V/D and abdominal pain with elevated white count.  Patient resting/sleeping upon entering room, easily aroused, vomiting currently controlled.  CT results discussed with patient, no acute abnormality noted.    2315:  After IV fluids and medications, patient feeling better, tolerating po fluids without difficulty.  No additional episodes of emesis or diarrhea since 1530.  Patient reports she feels like she is ready to return home.  Jimmye Norman, NP 04/20/12 [phone]

## 2012-04-20 NOTE — ED Provider Notes (Signed)
History     CSN: [id number]  Arrival date & time 04/20/12  1018   First MD Initiated Contact with Patient 04/20/12 1046      Chief Complaint  Patient presents with  . Diarrhea    (Consider location/radiation/quality/duration/timing/severity/associated sxs/prior treatment) HPI Comments: Patient reports nausea, vomiting, and diarrhea that started last night. She reports eating at [facility] yesterday and after which her symptoms started. She reports associated weakness and body aches. She has not tried anything to make her feel better. She has vomited and had diarrhea "countless times" since her symptoms started. No one around her is sick. She admits to fever, chills, and associated abdominal pain. She denies chest pain, SOB, headache.   Patient is a 41 y.o. female presenting with diarrhea.  Diarrhea The primary symptoms include fever, fatigue, abdominal pain, nausea, vomiting, diarrhea and myalgias. Primary symptoms do not include dysuria or rash.  The illness is also significant for chills. The illness does not include constipation or back pain.    Past Medical History  Diagnosis Date  . Hypertension   . Overactive bladder   . Esophageal reflux   . Migraine   . Psychosis   . Schizophrenia, schizo-affective   . OCD (obsessive compulsive disorder)   . Bipolar 1 disorder   . Osteoarthritis   . Restless leg syndrome     Past Surgical History  Procedure Date  . Neck surgery 2009, 2011  . Vesicovaginal fistula closure w/ tah 2001  . Tubal ligation 2004  . Shoulder surgery     left, bone spurs, torn muscle    History reviewed. No pertinent family history.  History  Substance Use Topics  . Smoking status: Never Smoker   . Smokeless tobacco: Never Used  . Alcohol Use: Yes     occ    OB History    Grav Para Term Preterm Abortions TAB SAB Ect Mult Living                  Review of Systems  Constitutional: Positive for fever, chills and fatigue. Negative for  diaphoresis.  HENT: Negative for sore throat and trouble swallowing.   Eyes: Negative for photophobia and visual disturbance.  Respiratory: Negative for cough, chest tightness and shortness of breath.   Cardiovascular: Negative for chest pain.  Gastrointestinal: Positive for nausea, vomiting, abdominal pain and diarrhea. Negative for constipation.  Genitourinary: Negative for dysuria.  Musculoskeletal: Positive for myalgias. Negative for back pain.  Skin: Negative for pallor and rash.  Neurological: Positive for light-headedness. Negative for dizziness and headaches.    Allergies  Aspirin; Benadryl; Codeine; and Prednisone  Home Medications   Current Outpatient Rx  Name Route Sig Dispense Refill  . BUDESONIDE-FORMOTEROL FUMARATE 160-4.5 MCG/ACT IN AERO Inhalation Inhale 2 puffs into the lungs 2 (two) times daily.      . CELECOXIB 200 MG PO CAPS Oral Take 200 mg by mouth 2 (two) times daily.      Marland Kitchen CLOBETASOL PROPIONATE 0.05 % EX CREA Topical Apply topically 2 (two) times daily.      Marland Kitchen CLOBETASOL PROPIONATE 0.05 % EX SHAM Topical Apply topically. Once daily     . CYCLOSPORINE 0.05 % OP EMUL Both Eyes Place 1 drop into both eyes every 12 (twelve) hours.      Marland Kitchen ELETRIPTAN HYDROBROMIDE 40 MG PO TABS Oral One tablet by mouth as needed for migraine headache.  If the headache improves and then returns, dose may be repeated after 2 hours have  elapsed since first dose (do not exceed 80 mg per day). may repeat in 2 hours if necessary     . FLUOCINOLONE ACETONIDE 0.01 % EX OIL Topical Apply topically 3 (three) times daily.      Marland Kitchen FLUOCINONIDE 0.05 % EX CREA Topical Apply topically. 3 times a week     . GABAPENTIN 300 MG PO CAPS Oral Take 300 mg by mouth 3 (three) times daily.      Marland Kitchen HYDROCHLOROTHIAZIDE 25 MG PO TABS Oral Take 25 mg by mouth daily.      . INDOMETHACIN 50 MG RE SUPP Rectal Place 50 mg rectally.      Marland Kitchen LAMOTRIGINE 200 MG PO TABS Oral Take 200 mg by mouth daily.      Marland Kitchen LOTEPREDNOL  ETABONATE 0.5 % OP OINT Ophthalmic Apply to eye. 1 drop into affected eye twice a day     . METHOCARBAMOL 500 MG PO TABS Oral Take 500 mg by mouth 3 (three) times daily.      . NON FORMULARY  2 %Baclofen-2%Cyclobenzaprine-3%Diclofenac-2%Gabapentin-6%tetracaine ointment-use as directed     . OLOPATADINE HCL 0.2 % OP SOLN Ophthalmic Apply to eye as needed.      Marland Kitchen OMEPRAZOLE 40 MG PO CPDR Oral Take 40 mg by mouth daily.      Marland Kitchen OXYMORPHONE HCL 10 MG PO TABS Oral Take 10 mg by mouth every 4 (four) hours as needed.      Marland Kitchen POLYETHYLENE GLYCOL 3350 PO PACK Oral Take 17 g by mouth. As directed     . PRAMIPEXOLE DIHYDROCHLORIDE 0.5 MG PO TABS Oral Take 0.5 mg by mouth. 1 tablet at bedtime     . QUETIAPINE FUMARATE 400 MG PO TABS Oral Take 400 mg by mouth at bedtime.      . SOLIFENACIN SUCCINATE 10 MG PO TABS Oral Take 10 mg by mouth daily.     . TOPIRAMATE 100 MG PO TABS  100 mg. 1 tab every AM and 2 at night    . VERAPAMIL HCL 240 MG PO CP24 Oral Take 240 mg by mouth at bedtime.        BP 155/101  Pulse 76  Temp 99.5 F (37.5 C) (Oral)  Resp 20  SpO2 100%  Physical Exam  Nursing note and vitals reviewed. Constitutional: She is oriented to person, place, and time. She appears well-developed and well-nourished. No distress.       Patient appears uncomfortable as she is vomiting while I am in the room.   HENT:  Head: Normocephalic and atraumatic.  Eyes: Conjunctivae are normal. No scleral icterus.  Neck: Normal range of motion.  Cardiovascular: Normal rate and regular rhythm.  Exam reveals no gallop and no friction rub.   No murmur heard. Pulmonary/Chest: Effort normal. No respiratory distress. She has no wheezes. She has no rales. She exhibits no tenderness.  Abdominal: Soft. There is tenderness.       Generalized tenderness to palpation.  Musculoskeletal: Normal range of motion.  Neurological: She is alert and oriented to person, place, and time.  Skin: Skin is warm and dry. She is not  diaphoretic.  Psychiatric: She has a normal mood and affect. Her behavior is normal.    ED Course  Procedures (including critical care time)  Labs Reviewed  CBC - Abnormal; Notable for the following:    WBC 17.3 (*)     MCV 77.9 (*)     All other components within normal limits  BASIC METABOLIC PANEL  No results found.   No diagnosis found.    MDM  11:09 AM Patient is currently dry-heaving and feeling nauseous. I will order fluids and phenergan to provide relief and reassess.   2:51 PM Patient is unrelieved by Phenergan and Zofran along with fluids. Still complaining of nausea and abdominal pain. I have ordered an abdomen pelvis CT with contrast for further evaluation of acute abdominal process.    Patient transferred to CDU for observation.   Emilia Beck, PA-C 04/24/12 [phone]

## 2012-04-24 NOTE — ED Provider Notes (Signed)
Medical screening examination/treatment/procedure(s) were conducted as a shared visit with non-physician practitioner(s) and myself.  I personally evaluated the patient during the encounter Patient presenting with diffuse abdominal pain, vomiting and diarrhea. States that intermittently after the last few weeks she's had bloody diarrhea and abdominal pain but worse over the last 24 hours. Denies bad food exposure or recent travel. No recent antibiotics. Elevated white count today is 17,000 which could be viral in origin however given her diffuse abdominal tenderness we'll get a CT to rule out diverticulitis  Gwyneth Sprout, MD 04/24/12 2309

## 2012-04-26 NOTE — ED Provider Notes (Signed)
Medical screening examination/treatment/procedure(s) were conducted as a shared visit with non-physician practitioner(s) and myself.  I personally evaluated the patient during the encounter  Patient improved and ready for discharge.    Shelda Jakes, MD 04/26/12 2117

## 2012-07-04 ENCOUNTER — Other Ambulatory Visit: Payer: Self-pay | Admitting: Pulmonary Disease

## 2012-08-03 ENCOUNTER — Other Ambulatory Visit: Payer: Self-pay | Admitting: Pulmonary Disease

## 2012-09-03 ENCOUNTER — Other Ambulatory Visit: Payer: Self-pay | Admitting: Pulmonary Disease

## 2012-11-30 ENCOUNTER — Other Ambulatory Visit: Payer: Self-pay

## 2012-11-30 MED ORDER — SODIUM OXYBATE 500 MG/ML PO SOLN: ORAL | Status: DC

## 2012-11-30 NOTE — Telephone Encounter (Signed)
Patient requesting refill on Xyrem.  Dr Vickey Huger is out of the office.  Dr Eusebio Me

## 2012-12-12 ENCOUNTER — Telehealth: Payer: Self-pay

## 2012-12-12 NOTE — Telephone Encounter (Signed)
[facility] pharmacy called to verify the Xyrem rx that was faxed.  I spoke with [place].  She then transferred me to [facility], who did not answer.  I left a detailed message verifying all info for rx that ws sent.  Asked them to call me back if needed.

## 2013-01-18 ENCOUNTER — Encounter: Payer: Self-pay | Admitting: Neurology

## 2013-01-18 ENCOUNTER — Ambulatory Visit: Payer: Self-pay | Admitting: Neurology

## 2013-02-14 ENCOUNTER — Ambulatory Visit (INDEPENDENT_AMBULATORY_CARE_PROVIDER_SITE_OTHER): Payer: Federal, State, Local not specified - PPO | Admitting: Neurology

## 2013-02-14 ENCOUNTER — Encounter: Payer: Self-pay | Admitting: Neurology

## 2013-02-14 VITALS — BP 128/89 | HR 81 | Temp 98.8°F | Ht 64.0 in | Wt 188.0 lb

## 2013-02-14 DIAGNOSIS — G478 Other sleep disorders: Secondary | ICD-10-CM

## 2013-02-14 DIAGNOSIS — G47411 Narcolepsy with cataplexy: Secondary | ICD-10-CM

## 2013-02-14 DIAGNOSIS — Z5181 Encounter for therapeutic drug level monitoring: Secondary | ICD-10-CM

## 2013-02-14 MED ORDER — SODIUM OXYBATE 500 MG/ML PO SOLN: ORAL | Status: DC

## 2013-02-14 MED ORDER — AMPHETAMINE-DEXTROAMPHETAMINE 10 MG PO TABS: 10.0000 mg | ORAL_TABLET | Freq: Every day | ORAL | Status: DC

## 2013-02-14 MED ORDER — SODIUM OXYBATE 500 MG/ML PO SOLN: 4500.0000 mg | Freq: Two times a day (BID) | ORAL | Status: DC

## 2013-02-14 NOTE — Progress Notes (Signed)
[facility]  Provider:  Dr Charde Macfarlane Referring Provider: Benita Stabile, MD Primary Care Physician:  Benita Stabile, MD  Chief Complaint  Patient presents with  . Follow-up    RLS,narcolepsy,rm 10    HPI:  [name] is a 42 y.o. female here as a referral from Dr. Clovis Riley for follow up on her sleep disorder.  The patient had originally had a polysomnography at [facility] interpreted by Dr. Epimenio Foot. 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 The patient still had and dorsi Epworth score at his lap at 20 points her BMI was 40.2 and he commended  an MSLT to follow.    Dr. Vassie Loll had seen her and treated her with dexilant in 2012, the patient was in December 2012 referred to our sleep lab for a reevaluation of the diagnosis of presumed narcolepsy. At the time the patient presented with an Epworth sleepiness score of 20 points. Her PSG showed an AHI of 5 and she was titrated to 6 cm water CPAP however 90 days after her CPAP titration Epworth score remained at 22 points, severely elevated. She denied feeling any benefit from the CPAP he was in was compliance as he could see on the downloads she reached up to 5 hours nightly continued to take several naps each day.  She reports  irresistible urge to sleep all day - she fell asleep during TV chills during conversations. Patient goals to bed at 11 PM and she rises about 5:30 AM to get her kids ready for school. She has 3 young children, she has not had a shift work history. The patient states that her sleep time of approximately 6 hours at night is not refreshing her, she is sometimes has very vivid dreams she does not have nocturia Her husband had told her that she snores as well as her children and the patient has mild retrognathia. She felt that she never got enough sleep.  She reported having sleep hallucinations and dream  inclusions as well as sleep paralysis. The patient repeated a polysomnography on CPAP number AHI was 1.1 her RDI 4.2 and she had minor PLM related arousals of 1.9 per hour. And MSLT followed and the patient had a mean sleep latency of only 3.6 minutes, no REM sleep onsets were noted. This degree of hypersomnia was definitely pathologic and in the proper clinical scenario consistent with the diagnosis of narcolepsy. She also reported now  cataplectic events- and was started on XYREM.   In May 2013 her Epworth score was still 21 in spite of being on 2 endorses of Xyrem nightly it turned out that the patient had not reached the 4.5 g recommended doors by July of the same year she presented with some weight loss and still had the Epworth score and Doris the 20 points of the abdominal was obtained and showed the residual AHI of 42.1 the RDI of 3.0 she definitely was not sleepy due to to apnea or upper airway resistance he syndrome. By November 2013 was placed on Adderall in addition to the Xyrem, her Epworth sleepiness score is now 16 still sub-optimal.   Mirapex was added for RLS and she felt no improvement, Dr. Craige Cotta at [facility] , her regular Neurologist treats her for that , and added Neurontin which she could not tolerate. The Craige Cotta also treat the patient's migraines with  Botox -injections.  She is currently using Xyrem at  6 g at the beginning of the night and 3 g for the second half of the night. She feels this has given her overall  the best sleep. She has significantly less cataplectic attacks on Xyrem. I would like for the patient to undergo an HLA antibody test to look further for the classification of her narcolepsy disorder.                Review of Systems: Out of a complete 14 system review, the patient complains of only the following symptoms, and all other reviewed systems are negative. Epworth 16 points, 02-14-13 .  History   Social History  . Marital Status: Married     Spouse Name: N/A    Number of Children: 3  . Years of Education: BS   Occupational History  . unemployed   .     Social History Main Topics  . Smoking status: Never Smoker   . Smokeless tobacco: Never Used  . Alcohol Use: Yes     Comment: occ  . Drug Use: Not on file  . Sexually Active: Not on file   Other Topics Concern  . Not on file   Social History Narrative    This patient has an RDI of 12 and AHI of 4.8,  titrated to 6 cm water after PSG, she did qualify for MSLT, Epworth 22 today on 6 cm water.      Based on her MSLT  of 3.6 minutes without SREM and Epworth of 22 on CPAP with residual AHI of 1.8, she should be treated  in a trial base for narcolepsy. She is on many REM supressant medications, and may not be able to  express the narcolepsy in an MSLT.    Family History  Problem Relation Age of Onset  . Cancer Mother     breast  . Cancer Father     prostate,colon  . Hypertension Father   . Thyroid disease Father   . High Cholesterol Father   . Thyroid disease Sister     Past Medical History  Diagnosis Date  . Hypertension   . Overactive bladder   . Esophageal reflux   . Migraine   . Psychosis   . Schizophrenia, schizo-affective   . OCD (obsessive compulsive disorder)   . Bipolar 1 disorder   . Osteoarthritis   . Restless leg syndrome   . S/P lumbar fusion 2011  . Torn rotator cuff it:2012,rt:2013  . Migraines   . Depression     excessive daytime sleepiness-MSLT confirmed pathological degree of hypersomnia-12/13  . Anxiety   . Narcolepsy   . Migraine   . Hypersomnia     Past Surgical History  Procedure Laterality Date  . Neck surgery  2009, 2011  . Vesicovaginal fistula closure w/ tah  2001  . Tubal ligation  2004  . Shoulder surgery      left, bone spurs, torn muscle  . Abdominal hysterectomy  2001  . Spurs,ruptured disc bone  2009  . Lumbar fusion  2011  . Rotator cuff repair  2012,2013    torn     Current Outpatient Prescriptions   Medication Sig Dispense Refill  . Amphetamine-Dextroamphetamine (ADDERALL PO) Take by mouth daily.      . budesonide-formoterol (SYMBICORT) 160-4.5 MCG/ACT inhaler Inhale 2 puffs into the lungs 2 (two) times daily.        . celecoxib (CELEBREX) 200 MG capsule Take 200 mg by mouth 2 (two) times daily.        Marland Kitchen  clobetasol (TEMOVATE) 0.05 % cream Apply topically 2 (two) times daily.        . Clobetasol Propionate (CLOBEX) 0.05 % shampoo Apply topically every 7 (seven) days. Once daily      . cycloSPORINE (RESTASIS) 0.05 % ophthalmic emulsion Place 1 drop into both eyes every 12 (twelve) hours.        Marland Kitchen eletriptan (RELPAX) 40 MG tablet One tablet by mouth as needed for migraine headache.  If the headache improves and then returns, dose may be repeated after 2 hours have elapsed since first dose (do not exceed 80 mg per day). may repeat in 2 hours if necessary       . fentaNYL (DURAGESIC - DOSED MCG/HR) 25 MCG/HR Place 1 patch onto the skin every 3 (three) days.      . fluocinolone (DERMA-SMOOTHE/FS BODY) 0.01 % external oil Apply topically 3 (three) times daily.        . fluocinonide (LIDEX) 0.05 % cream Apply topically. 3 times a week       . gabapentin (NEURONTIN) 300 MG capsule Take 300 mg by mouth 3 (three) times daily.       . hydrochlorothiazide (HYDRODIURIL) 25 MG tablet Take 25 mg by mouth daily.        . Loteprednol Etabonate (LOTEMAX) 0.5 % OINT Apply to eye. 1 drop into affected eye twice a day       . lurasidone (LATUDA) 40 MG TABS Take by mouth at bedtime.      . methocarbamol (ROBAXIN) 500 MG tablet Take 500 mg by mouth 3 (three) times daily.        . NON FORMULARY Apply 1 application topically 2 (two) times daily. 2 %Baclofen-2%Cyclobenzaprine-3%Diclofenac-2%Gabapentin-6%tetracaine ointment-use as directed      . Olopatadine HCl (PATADAY) 0.2 % SOLN Apply 1 drop to eye daily as needed. As needed for allergy irritation.      . OnabotulinumtoxinA (BOTOX IJ) Inject as directed every 3  (three) months.      . pantoprazole (PROTONIX) 20 MG tablet Take 20 mg by mouth daily.      . polyethylene glycol (MIRALAX / GLYCOLAX) packet Take 17 g by mouth daily as needed. As d      . pramipexole (MIRAPEX) 0.5 MG tablet Take 0.5 mg by mouth. 1 tablet at bedtime       . Sodium Oxybate (XYREM) 500 MG/ML SOLN Take 6 gms po for first dose and take 3 gms po for second dose as directed  270 mL  5  . topiramate (TOPAMAX) 100 MG tablet 100 mg. 1 tab every AM and 2 at night      . verapamil (VERELAN PM) 240 MG 24 hr capsule Take 240 mg by mouth at bedtime.        . AMITIZA 24 MCG capsule       . clotrimazole-betamethasone (LOTRISONE) lotion       . dexlansoprazole (DEXILANT) 60 MG capsule Take 1 capsule (60 mg total) by mouth daily.  30 capsule  6  . HYPERCARE 20 % external solution       . triamcinolone cream (KENALOG) 0.1 %        No current facility-administered medications for this visit.    Allergies as of 02/14/2013 - Review Complete 02/14/2013  Allergen Reaction Noted  . Dust mite extract  01/18/2013  . Mold extract (trichophyton)  01/18/2013  . Pollen extract  01/18/2013  . Aspirin Nausea Only 06/03/2011  . Benadryl (diphenhydramine hcl) Other (See  Comments) 06/09/2011  . Codeine Nausea And Vomiting 10/11/2011  . Prednisone Other (See Comments) 06/09/2011    Vitals: BP 128/89  Pulse 81  Temp(Src) 98.8 F (37.1 C) (Oral)  Ht 5\' 4"  (1.626 m)  Wt 188 lb (85.276 kg)  BMI 32.25 kg/m2 Last Weight:  Wt Readings from Last 1 Encounters:  02/14/13 188 lb (85.276 kg)   Last Height:   Ht Readings from Last 1 Encounters:  02/14/13 5\' 4"  (1.626 m)    Physical exam:  General: The patient is awake, alert and appears not in acute distress. The patient is well groomed. Head: Normocephalic, atraumatic. Neck is supple. Mallampati 2  neck circumference:15.5 inches , lost 12 pounds since Dec. 2013  Cardiovascular:  Regular rate and rhythm, without  murmurs or carotid bruit, and without  distended neck veins. Respiratory: Lungs are clear to auscultation. Skin:  Without evidence of edema, or rash Trunk: BMI is still  elevated and patient  has normal posture.  Neurologic exam : The patient is awake and alert, oriented to place and time.  Memory subjective  described as intact. There is a normal attention span & concentration ability. Speech is fluent without  dysarthria, dysphonia or aphasia. Mood and affect are appropriate.  Cranial nerves: Pupils are equal and briskly reactive to light. Funduscopic exam without  evidence of pallor or edema. Extraocular movements  in vertical and horizontal planes intact and without nystagmus. Visual fields by finger perimetry are intact. Hearing to finger rub intact.  Facial sensation intact to fine touch. Facial motor strength is symmetric and tongue and uvula move midline.  Motor exam:  Normal tone and normal muscle bulk and symmetric normal strength in all extremities.  Sensory:  Fine touch, pinprick and vibration were tested in all extremities. Proprioception is tested in the upper extremities only. This was  normal.  Coordination: Rapid alternating movements in the fingers/hands is tested and normal. Finger-to-nose maneuver tested and normal without evidence of ataxia, dysmetria or tremor.  Gait and station: Patient walks without assistive device and is able and assisted stool climb up to the exam table. Strength within normal limits. Stance is stable and normal.  Romberg testing is negative .  Deep tendon reflexes: in the  upper and lower extremities are symmetric and intact. Babinski maneuver response is downgoing.   Assessment:  After physical and neurologic examination, review of laboratory studies, imaging, neurophysiology testing and pre-existing records, assessment will be reviewed on the problem list.   Patient will continue on Xyrem as well as on her CPAP machine. I am optimistic that further weight loss may eliminate the need  for CPAP in the near future. Xyrem will be refilled today I will also ask the patient will obtain liver function tests. I will share these with her referring physician Dr. Andee Poles.   Plan:  Treatment plan , Adderall refilled , XYREM.

## 2013-02-14 NOTE — Patient Instructions (Signed)
Narcolepsy Narcolepsy is a disabling neurological disorder of sleep regulation. It affects the control of sleep. It also affects the control of wakefulness. It is an interruption of the dreaming state of sleep. This state is known as REM or rapid eye movement sleep.  SYMPTOMS  The development, number, and severity of symptoms vary widely among people with the disorder. Symptoms generally begin between the ages of 15 and 30. The four classic symptoms of the disorder are:   Excessive daytime sleepiness.  Cataplexy. This is sudden, brief episodes of muscle weakness or paralysis. It is caused by strong emotions. Common strong emotions are laughter, anger, surprise, or anticipation.  Sleep paralysis. This is paralysis upon falling asleep or waking up.  Hallucinations. These are vivid dream-like images that occur at when you first fall asleep. Other symptoms include:   Unrelenting excessive sleepiness. This is usually the first and most obvious symptom.  Sleep attacks. Patients have strong sleep attacks throughout the day. These attacks can last for 30 seconds to more than 30 minutes. These happen no matter how much or how well the person slept the night before. These attacks end up making the person sleep at work and social events. The person can fall asleep while eating, talking, and driving. They also fall asleep at other out of place times.  Disturbed nighttime sleep.  Tossing and turning in bed.  Leg jerks.  Nightmares.  Waking up often. DIAGNOSIS  It's possible that genetics play a role in this disorder. Narcolepsy is not a rare disorder. It is often misdiagnosed. It is often diagnosed years after symptoms first appear. Early diagnosis and treatment are important. This help the physical and mental well-being of the patient. TREATMENT  There is no cure for narcolepsy. The symptoms can be controlled with behavioral and medical therapy. The excessive daytime sleepiness may be treated with  stimulant drugs. It may also be treated with the drug modafinil (Provigil). Cataplexy and other REM-sleep symptoms may be treated with antidepressant medications. Medications will reduce the symptoms. Medications will not ease symptoms entirely. Many available medications have side effects. Basic lifestyle changes may also reduce the symptoms. These changes include having regular sleep schedules and scheduled daytime naps. Other lifestyle changes include avoiding "over-stimulating" situations. Document Released: 08/13/2002 Document Revised: 11/15/2011 Document Reviewed: 08/23/2005 [employer] Patient Information 2014 [employer].  

## 2013-02-19 NOTE — Progress Notes (Signed)
No results posted - were labs drawn?

## 2013-03-19 ENCOUNTER — Encounter (HOSPITAL_COMMUNITY): Payer: Self-pay | Admitting: *Deleted

## 2013-03-19 ENCOUNTER — Emergency Department (HOSPITAL_COMMUNITY): Payer: Federal, State, Local not specified - PPO

## 2013-03-19 ENCOUNTER — Emergency Department (HOSPITAL_COMMUNITY)
Admission: EM | Admit: 2013-03-19 | Discharge: 2013-03-19 | Disposition: A | Discharge status: Home / Self Care | Payer: Federal, State, Local not specified - PPO | Attending: Emergency Medicine | Admitting: Emergency Medicine

## 2013-03-19 DIAGNOSIS — Z79899 Other long term (current) drug therapy: Secondary | ICD-10-CM | POA: Insufficient documentation

## 2013-03-19 DIAGNOSIS — R1013 Epigastric pain: Secondary | ICD-10-CM

## 2013-03-19 DIAGNOSIS — F329 Major depressive disorder, single episode, unspecified: Secondary | ICD-10-CM | POA: Insufficient documentation

## 2013-03-19 DIAGNOSIS — G43909 Migraine, unspecified, not intractable, without status migrainosus: Secondary | ICD-10-CM | POA: Insufficient documentation

## 2013-03-19 DIAGNOSIS — F411 Generalized anxiety disorder: Secondary | ICD-10-CM | POA: Insufficient documentation

## 2013-03-19 DIAGNOSIS — Z8739 Personal history of other diseases of the musculoskeletal system and connective tissue: Secondary | ICD-10-CM | POA: Insufficient documentation

## 2013-03-19 DIAGNOSIS — Z8651 Personal history of combat and operational stress reaction: Secondary | ICD-10-CM | POA: Insufficient documentation

## 2013-03-19 DIAGNOSIS — Z8659 Personal history of other mental and behavioral disorders: Secondary | ICD-10-CM | POA: Insufficient documentation

## 2013-03-19 DIAGNOSIS — I10 Essential (primary) hypertension: Secondary | ICD-10-CM | POA: Insufficient documentation

## 2013-03-19 DIAGNOSIS — Z3202 Encounter for pregnancy test, result negative: Secondary | ICD-10-CM | POA: Insufficient documentation

## 2013-03-19 DIAGNOSIS — F3289 Other specified depressive episodes: Secondary | ICD-10-CM | POA: Insufficient documentation

## 2013-03-19 DIAGNOSIS — F319 Bipolar disorder, unspecified: Secondary | ICD-10-CM | POA: Insufficient documentation

## 2013-03-19 DIAGNOSIS — K219 Gastro-esophageal reflux disease without esophagitis: Secondary | ICD-10-CM | POA: Insufficient documentation

## 2013-03-19 DIAGNOSIS — R112 Nausea with vomiting, unspecified: Secondary | ICD-10-CM

## 2013-03-19 LAB — CBC WITH DIFFERENTIAL/PLATELET
Basophils Absolute: 0 10*3/uL (ref 0.0–0.1)
Eosinophils Absolute: 0 10*3/uL (ref 0.0–0.7)
Eosinophils Relative: 0 % (ref 0–5)
Lymphocytes Relative: 14 % (ref 12–46)
Lymphs Abs: 1.3 10*3/uL (ref 0.7–4.0)
MCV: 77 fL — ABNORMAL LOW (ref 78.0–100.0)
Neutrophils Relative %: 84 % — ABNORMAL HIGH (ref 43–77)
Platelets: 294 10*3/uL (ref 150–400)
RBC: 5.01 MIL/uL (ref 3.87–5.11)
RDW: 13 % (ref 11.5–15.5)
WBC: 8.8 10*3/uL (ref 4.0–10.5)

## 2013-03-19 LAB — URINALYSIS, ROUTINE W REFLEX MICROSCOPIC
Bilirubin Urine: NEGATIVE
Glucose, UA: NEGATIVE mg/dL
Hgb urine dipstick: NEGATIVE
Specific Gravity, Urine: 1.014 (ref 1.005–1.030)
pH: 6.5 (ref 5.0–8.0)

## 2013-03-19 LAB — COMPREHENSIVE METABOLIC PANEL
ALT: 10 U/L (ref 0–35)
AST: 14 U/L (ref 0–37)
Alkaline Phosphatase: 58 U/L (ref 39–117)
CO2: 25 mEq/L (ref 19–32)
Calcium: 9.5 mg/dL (ref 8.4–10.5)
Potassium: 4.3 mEq/L (ref 3.5–5.1)
Sodium: 138 mEq/L (ref 135–145)
Total Protein: 8.2 g/dL (ref 6.0–8.3)

## 2013-03-19 MED ORDER — MORPHINE SULFATE 4 MG/ML IJ SOLN
4.0000 mg | Freq: Once | INTRAMUSCULAR | Status: AC
  Administered 2013-03-19: 4 mg via INTRAVENOUS
  Filled 2013-03-19: qty 1

## 2013-03-19 MED ORDER — ONDANSETRON HCL 4 MG PO TABS: 4.0000 mg | ORAL_TABLET | Freq: Four times a day (QID) | ORAL | Status: DC

## 2013-03-19 MED ORDER — TRAMADOL HCL 50 MG PO TABS: 50.0000 mg | ORAL_TABLET | Freq: Four times a day (QID) | ORAL | Status: DC | PRN

## 2013-03-19 MED ORDER — POTASSIUM CHLORIDE 20 MEQ/15ML (10%) PO LIQD
20.0000 meq | Freq: Once | ORAL | Status: AC
  Administered 2013-03-19: 20 meq via ORAL
  Filled 2013-03-19: qty 15

## 2013-03-19 MED ORDER — SODIUM CHLORIDE 0.9 % IV BOLUS (SEPSIS)
1000.0000 mL | Freq: Once | INTRAVENOUS | Status: AC
  Administered 2013-03-19: 1000 mL via INTRAVENOUS

## 2013-03-19 MED ORDER — IOHEXOL 300 MG/ML  SOLN
100.0000 mL | Freq: Once | INTRAMUSCULAR | Status: AC | PRN
  Administered 2013-03-19: 100 mL via INTRAVENOUS

## 2013-03-19 MED ORDER — HYDROMORPHONE HCL PF 1 MG/ML IJ SOLN
1.0000 mg | Freq: Once | INTRAMUSCULAR | Status: AC
  Administered 2013-03-19: 1 mg via INTRAVENOUS
  Filled 2013-03-19: qty 1

## 2013-03-19 MED ORDER — IOHEXOL 300 MG/ML  SOLN
25.0000 mL | Freq: Once | INTRAMUSCULAR | Status: AC | PRN
  Administered 2013-03-19: 25 mL via ORAL

## 2013-03-19 MED ORDER — ONDANSETRON HCL 4 MG/2ML IJ SOLN
4.0000 mg | Freq: Once | INTRAMUSCULAR | Status: AC
  Administered 2013-03-19: 4 mg via INTRAVENOUS
  Filled 2013-03-19: qty 2

## 2013-03-19 MED ORDER — FAMOTIDINE IN NACL 20-0.9 MG/50ML-% IV SOLN
20.0000 mg | Freq: Once | INTRAVENOUS | Status: AC
  Administered 2013-03-19: 20 mg via INTRAVENOUS
  Filled 2013-03-19: qty 50

## 2013-03-19 NOTE — ED Provider Notes (Signed)
History    CSN: [id number] Arrival date & time 03/19/13  4098  First MD Initiated Contact with Patient 03/19/13 0309     Chief Complaint  Patient presents with  . Emesis  . Abdominal Pain   (Consider location/radiation/quality/duration/timing/severity/associated sxs/prior Treatment) HPI [name] is a 42 year old woman who presents with complaints of epigastric pain, nausea and vomiting. She has had the symptoms for about the past month. Her symptoms occur after she eats. However, her symptoms have been self limited until today. She presents with persistent nausea, vomiting, and now dry heaves.  Her pain as 10 over 10, aching, nonradiating. She denies fever. History of abdominal surgeries. She has not had diarrhea, recent antibiotic use, genitourinary symptoms. Past Medical History  Diagnosis Date  . Hypertension   . Overactive bladder   . Esophageal reflux   . Migraine   . Psychosis   . Schizophrenia, schizo-affective   . OCD (obsessive compulsive disorder)   . Bipolar 1 disorder   . Osteoarthritis   . Restless leg syndrome   . S/P lumbar fusion 2011  . Torn rotator cuff it:2012,rt:2013  . Migraines   . Depression     excessive daytime sleepiness-MSLT confirmed pathological degree of hypersomnia-12/13  . Anxiety   . Narcolepsy   . Migraine   . Hypersomnia    Past Surgical History  Procedure Laterality Date  . Neck surgery  2009, 2011  . Vesicovaginal fistula closure w/ tah  2001  . Tubal ligation  2004  . Shoulder surgery      left, bone spurs, torn muscle  . Abdominal hysterectomy  2001  . Spurs,ruptured disc bone  2009  . Lumbar fusion  2011  . Rotator cuff repair  2012,2013    torn    Family History  Problem Relation Age of Onset  . Cancer Mother     breast  . Cancer Father     prostate,colon  . Hypertension Father   . Thyroid disease Father   . High Cholesterol Father   . Thyroid disease Sister    History  Substance Use Topics  . Smoking  status: Never Smoker   . Smokeless tobacco: Never Used  . Alcohol Use: Yes     Comment: occ   OB History   Grav Para Term Preterm Abortions TAB SAB Ect Mult Living                 Review of Systems Gen: no weight loss, fevers, chills, night sweats Eyes: no discharge or drainage, no occular pain or visual changes Nose: no epistaxis or rhinorrhea Mouth: no dental pain, no sore throat Neck: no neck pain Lungs: no SOB, cough, wheezing CV: no chest pain, palpitations, dependent edema or orthopnea Abd: as per hpi, otherwise negative GU: no dysuria or gross hematuria MSK: no myalgias or arthralgias Neuro: no headache, no focal neurologic deficits Skin: no rash Psyche: negative.  Allergies  Dust mite extract; Mold extract; Pollen extract; Aspirin; Benadryl; Codeine; and Prednisone  Home Medications   Current Outpatient Rx  Name  Route  Sig  Dispense  Refill  . AMITIZA 24 MCG capsule               . amphetamine-dextroamphetamine (ADDERALL) 10 MG tablet   Oral   Take 1 tablet (10 mg total) by mouth daily.   30 tablet   0   . budesonide-formoterol (SYMBICORT) 160-4.5 MCG/ACT inhaler   Inhalation   Inhale 2 puffs into the lungs 2 (two)  times daily.           . celecoxib (CELEBREX) 200 MG capsule   Oral   Take 200 mg by mouth 2 (two) times daily.           . clobetasol (TEMOVATE) 0.05 % cream   Topical   Apply topically 2 (two) times daily.           . Clobetasol Propionate (CLOBEX) 0.05 % shampoo   Topical   Apply topically every 7 (seven) days. Once daily         . clotrimazole-betamethasone (LOTRISONE) lotion               . cycloSPORINE (RESTASIS) 0.05 % ophthalmic emulsion   Both Eyes   Place 1 drop into both eyes every 12 (twelve) hours.           Marland Kitchen dexlansoprazole (DEXILANT) 60 MG capsule   Oral   Take 1 capsule (60 mg total) by mouth daily.   30 capsule   6   . eletriptan (RELPAX) 40 MG tablet   Oral   One tablet by mouth as needed  for migraine headache.  If the headache improves and then returns, dose may be repeated after 2 hours have elapsed since first dose (do not exceed 80 mg per day). may repeat in 2 hours if necessary          . fentaNYL (DURAGESIC - DOSED MCG/HR) 25 MCG/HR   Transdermal   Place 1 patch onto the skin every other day.          . fluocinolone (DERMA-SMOOTHE/FS BODY) 0.01 % external oil   Topical   Apply topically 3 (three) times daily.           . fluocinonide (LIDEX) 0.05 % cream   Topical   Apply topically. 3 times a week          . gabapentin (NEURONTIN) 300 MG capsule   Oral   Take 300 mg by mouth 3 (three) times daily.          . hydrochlorothiazide (HYDRODIURIL) 25 MG tablet   Oral   Take 25 mg by mouth daily.           Marland Kitchen HYPERCARE 20 % external solution               . Loteprednol Etabonate (LOTEMAX) 0.5 % OINT   Ophthalmic   Apply to eye. 1 drop into affected eye twice a day          . lurasidone (LATUDA) 40 MG TABS   Oral   Take by mouth at bedtime.         . methocarbamol (ROBAXIN) 500 MG tablet   Oral   Take 500 mg by mouth 3 (three) times daily.           . NON FORMULARY   Topical   Apply 1 application topically 2 (two) times daily. 2 %Baclofen-2%Cyclobenzaprine-3%Diclofenac-2%Gabapentin-6%tetracaine ointment-use as directed         . Olopatadine HCl (PATADAY) 0.2 % SOLN   Ophthalmic   Apply 1 drop to eye daily as needed. As needed for allergy irritation.         . pantoprazole (PROTONIX) 20 MG tablet   Oral   Take 20 mg by mouth daily.         . polyethylene glycol (MIRALAX / GLYCOLAX) packet   Oral   Take 17 g by mouth daily as needed. As d         .  pramipexole (MIRAPEX) 0.5 MG tablet   Oral   Take 0.5 mg by mouth. 1 tablet at bedtime          . Sodium Oxybate 500 MG/ML SOLN   Oral   Take 9 mLs (4,500 mg total) by mouth 2 (two) times daily.   270 mL   0     Twice nightly , 4500 mg po .   . topiramate (TOPAMAX)  100 MG tablet      100 mg. 1 tab every AM and 2 at night         . triamcinolone cream (KENALOG) 0.1 %               . verapamil (VERELAN PM) 240 MG 24 hr capsule   Oral   Take 240 mg by mouth at bedtime.           . OnabotulinumtoxinA (BOTOX IJ)   Injection   Inject as directed every 3 (three) months.          BP 146/78  Pulse 70  Temp(Src) 99.4 F (37.4 C) (Oral)  Resp 16  SpO2 100% Physical Exam Gen: well developed and well nourished appearing, appears uncomfortable Head: NCAT Eyes: PERL, EOMI Nose: no epistaixis or rhinorrhea Mouth/throat: mucosa is moist and pink Neck: supple, no stridor Lungs: CTA B, no wheezing, rhonchi or rales Heart regular rate and rhythm, 2/6 systolic murmur, extremities well perfused Abd: soft, tender over the midline epigastrium and right upper quadrant, normal bowel sounds, nondistended Back: no ttp, no cva ttp Skin: no rashese, wnl Neuro: CN ii-xii grossly intact, no focal deficits Psyche; normal affect,  calm and cooperative.   ED Course  Procedures (including critical care time) Results for orders placed during the hospital encounter of 03/19/13 (from the past 24 hour(s))  CBC WITH DIFFERENTIAL     Status: Abnormal   Collection Time    03/19/13  3:27 AM      Result Value Range   WBC 8.8  4.0 - 10.5 K/uL   RBC 5.01  3.87 - 5.11 MIL/uL   Hemoglobin 14.0  12.0 - 15.0 g/dL   HCT 45.4  09.8 - 11.9 %   MCV 77.0 (*) 78.0 - 100.0 fL   MCH 27.9  26.0 - 34.0 pg   MCHC 36.3 (*) 30.0 - 36.0 g/dL   RDW 14.7  82.9 - 56.2 %   Platelets 294  150 - 400 K/uL   Neutrophils Relative % 84 (*) 43 - 77 %   Neutro Abs 7.4  1.7 - 7.7 K/uL   Lymphocytes Relative 14  12 - 46 %   Lymphs Abs 1.3  0.7 - 4.0 K/uL   Monocytes Relative 1 (*) 3 - 12 %   Monocytes Absolute 0.1  0.1 - 1.0 K/uL   Eosinophils Relative 0  0 - 5 %   Eosinophils Absolute 0.0  0.0 - 0.7 K/uL   Basophils Relative 0  0 - 1 %   Basophils Absolute 0.0  0.0 - 0.1 K/uL   COMPREHENSIVE METABOLIC PANEL     Status: Abnormal   Collection Time    03/19/13  3:27 AM      Result Value Range   Sodium 138  135 - 145 mEq/L   Potassium 4.3  3.5 - 5.1 mEq/L   Chloride 101  96 - 112 mEq/L   CO2 25  19 - 32 mEq/L   Glucose, Bld 133 (*) 70 - 99 mg/dL   BUN  8  6 - 23 mg/dL   Creatinine, Ser 2.13  0.50 - 1.10 mg/dL   Calcium 9.5  8.4 - 08.6 mg/dL   Total Protein 8.2  6.0 - 8.3 g/dL   Albumin 4.2  3.5 - 5.2 g/dL   AST 14  0 - 37 U/L   ALT 10  0 - 35 U/L   Alkaline Phosphatase 58  39 - 117 U/L   Total Bilirubin 0.3  0.3 - 1.2 mg/dL   GFR calc non Af Amer >90  >90 mL/min   GFR calc Af Amer >90  >90 mL/min  LIPASE, BLOOD     Status: None   Collection Time    03/19/13  3:27 AM      Result Value Range   Lipase 30  11 - 59 U/L     US Abdomen Complete  03/19/2013   *RADIOLOGY REPORT*  Clinical Data:  Right upper quadrant abdominal pain; nausea and vomiting.  ABDOMINAL ULTRASOUND COMPLETE  Comparison:  CT of the abdomen and pelvis performed 04/20/2012, and abdominal ultrasound performed 06/30/2010  Findings:  Gallbladder: There appears to be a 0.6 cm polyp adjacent to a fold in the gallbladder wall.  The gallbladder wall is thickened, measuring up to 0.5 cm in thickness.  No ultrasonographic Murphy's sign is elicited.  No pericholecystic fluid is seen.  No definite gallstones are identified.  Common Bile Duct:  0.4 cm in diameter; within normal limits in caliber.  Liver:  Normal parenchymal echogenicity and echotexture; no focal lesions identified.  Limited Doppler evaluation demonstrates normal blood flow within the liver.  IVC:  Unremarkable in appearance.  Pancreas:  Although the pancreas is difficult to visualize in its entirety due to overlying bowel gas, no focal pancreatic abnormality is identified.  Spleen:  9.7 cm in length; within normal limits in size and echotexture.  Right kidney:  11.0 cm in length; normal in size, configuration and parenchymal echogenicity.  No  evidence of mass or hydronephrosis.  Left kidney:  9.4 cm in length; normal in size, configuration and parenchymal echogenicity.  No evidence of mass or hydronephrosis.  Abdominal Aorta:  Normal in caliber; no aneurysm identified.  IMPRESSION:  1.  Gallbladder wall thickening, without definite evidence for obstruction or cholecystitis.  Gallbladder polyp noted.  No ultrasonographic Murphy's sign elicited. 2.  Otherwise unremarkable abdominal ultrasound.   Original Report Authenticated By: Tonia Ghent, M.D.     MDM  DDX:  Gastritis, IBS, gallbladder disease, pancreatitis, colitis, ibd.   ED work up is thus far nondiagnostic with normal CBC, CMP, lipase and VS. Patient says she is still experiencing continuous nausea and epigastric pain. U/S shows mild GB wall thickening but no other signs of cholecystitis.   On repeat exam, the patient has ttp over the RLQ and requests more pain medication. I suspect that her sx are secondary to functional GI issues. However, in light of ongoing pain which has now migrated to the RLQ, we will obtain a CT abd/pelvis to rule out appendicitis. If this study is unremarkable, I anticipate that the patient will be stable for discharge with plan for outpatient follow up.   Brandt Loosen, MD 03/21/13 [phone]

## 2013-03-19 NOTE — ED Notes (Signed)
Family at bedside. 

## 2013-03-19 NOTE — ED Notes (Signed)
Report received, assumed care.  

## 2013-03-19 NOTE — ED Notes (Signed)
Patient transported to CT 

## 2013-03-19 NOTE — ED Notes (Signed)
Patient states n/v x 1 month, patient states acute episode x 1 day with no relief, patient states usually nausea subsides after a period post meals,

## 2013-03-19 NOTE — ED Provider Notes (Signed)
CT scan neg for acute finding.  Pt able to tolerates PO.  Pt agrees to f/u with PCP for further care.    Fayrene Helper, PA-C 03/19/13 1025

## 2013-03-19 NOTE — ED Notes (Signed)
Ct notified pt finished drinking contrast.  

## 2013-03-20 NOTE — ED Provider Notes (Signed)
Medical screening examination/treatment/procedure(s) were performed by non-physician practitioner and as supervising physician I was immediately available for consultation/collaboration.  Cap Massi T Erika Hussar, MD 03/20/13 0702 

## 2013-04-11 ENCOUNTER — Other Ambulatory Visit: Payer: Self-pay | Admitting: Gastroenterology

## 2013-04-11 DIAGNOSIS — R1013 Epigastric pain: Secondary | ICD-10-CM

## 2013-04-19 ENCOUNTER — Ambulatory Visit
Admission: RE | Admit: 2013-04-19 | Discharge: 2013-04-19 | Disposition: A | Discharge status: Home / Self Care | Payer: Federal, State, Local not specified - PPO | Source: Ambulatory Visit | Attending: Gastroenterology | Admitting: Gastroenterology

## 2013-04-19 DIAGNOSIS — R1013 Epigastric pain: Secondary | ICD-10-CM

## 2013-05-15 ENCOUNTER — Other Ambulatory Visit: Payer: Self-pay

## 2013-05-15 DIAGNOSIS — Z1231 Encounter for screening mammogram for malignant neoplasm of breast: Secondary | ICD-10-CM

## 2013-05-16 ENCOUNTER — Ambulatory Visit
Admission: RE | Admit: 2013-05-16 | Discharge: 2013-05-16 | Disposition: A | Discharge status: Home / Self Care | Payer: Federal, State, Local not specified - PPO | Source: Ambulatory Visit

## 2013-05-16 DIAGNOSIS — Z1231 Encounter for screening mammogram for malignant neoplasm of breast: Secondary | ICD-10-CM

## 2013-06-27 ENCOUNTER — Other Ambulatory Visit: Payer: Self-pay | Admitting: Orthopedic Surgery

## 2013-06-27 DIAGNOSIS — M961 Postlaminectomy syndrome, not elsewhere classified: Secondary | ICD-10-CM

## 2013-07-02 ENCOUNTER — Encounter (INDEPENDENT_AMBULATORY_CARE_PROVIDER_SITE_OTHER): Payer: Self-pay | Admitting: Surgery

## 2013-07-02 ENCOUNTER — Ambulatory Visit (INDEPENDENT_AMBULATORY_CARE_PROVIDER_SITE_OTHER): Payer: Federal, State, Local not specified - PPO | Admitting: Surgery

## 2013-07-02 VITALS — BP 142/66 | HR 72 | Resp 16 | Ht 62.0 in | Wt 177.3 lb

## 2013-07-02 DIAGNOSIS — K811 Chronic cholecystitis: Secondary | ICD-10-CM | POA: Insufficient documentation

## 2013-07-02 NOTE — Progress Notes (Signed)
Patient ID: [name], female   DOB: 11-May-1971, 42 y.o.   MRN: [id number]  Chief Complaint  Patient presents with  . New Evaluation    eval GB    HPI [name] is a 42 y.o. female.   HPI This is a pleasant female referred by Dr. Shon Padilla and Dr. Sherlyn Padilla for evaluation of epigastric abdominal pain. She has had discomfort for several months. The pain moved to the right upper quadrant and through to the back. It is intermittent and sharp. She has nausea and vomiting. It is exacerbated by fatty and greasy meals. She is otherwise without complaints. The pain is moderate in intensity. Past Medical History  Diagnosis Date  . Hypertension   . Overactive bladder   . Esophageal reflux   . Migraine   . Psychosis   . Schizophrenia, schizo-affective   . OCD (obsessive compulsive disorder)   . Bipolar 1 disorder   . Osteoarthritis   . Restless leg syndrome   . S/P lumbar fusion 2011  . Torn rotator cuff it:2012,rt:2013  . Migraines   . Depression     excessive daytime sleepiness-MSLT confirmed pathological degree of hypersomnia-12/13  . Anxiety   . Narcolepsy   . Migraine   . Hypersomnia     Past Surgical History  Procedure Laterality Date  . Neck surgery  2009, 2011  . Vesicovaginal fistula closure w/ tah  2001  . Tubal ligation  2004  . Shoulder surgery      left, bone spurs, torn muscle  . Abdominal hysterectomy  2001  . Spurs,ruptured disc bone  2009  . Lumbar fusion  2011  . Rotator cuff repair  2012,2013    torn   . Oophorectomy    . Ankle fusion      Family History  Problem Relation Age of Onset  . Cancer Mother     breast  . Cancer Father     prostate,colon  . Hypertension Father   . Thyroid disease Father   . High Cholesterol Father   . Thyroid disease Sister   . Cancer Paternal Aunt     colon  . Cancer Maternal Grandmother     kidney  . Cancer Paternal Grandmother     colon  . Cancer Paternal Aunt     lung    Social History History   Substance Use Topics  . Smoking status: Never Smoker   . Smokeless tobacco: Never Used  . Alcohol Use: Yes     Comment: occ    Allergies  Allergen Reactions  . Dust Mite Extract   . Mold Extract [Trichophyton]   . Pollen Extract   . Aspirin Nausea Only  . Benadryl [Diphenhydramine Hcl] Other (See Comments)    Makes [name] hyper, jittery  . Codeine Nausea And Vomiting  . Prednisone Other (See Comments)    Makes [name] hyper, jittery     Current Outpatient Prescriptions  Medication Sig Dispense Refill  . AMITIZA 24 MCG capsule       . amphetamine-dextroamphetamine (ADDERALL) 10 MG tablet Take 1 tablet (10 mg total) by mouth daily.  30 tablet  0  . budesonide-formoterol (SYMBICORT) 160-4.5 MCG/ACT inhaler Inhale 2 puffs into the lungs 2 (two) times daily.        . celecoxib (CELEBREX) 200 MG capsule Take 200 mg by mouth 2 (two) times daily.        . clobetasol (TEMOVATE) 0.05 % cream Apply topically 2 (two) times daily.        Marland Kitchen  Clobetasol Propionate (CLOBEX) 0.05 % shampoo Apply topically every 7 (seven) days. Once daily      . clotrimazole-betamethasone (LOTRISONE) lotion       . cycloSPORINE (RESTASIS) 0.05 % ophthalmic emulsion Place 1 drop into both eyes every 12 (twelve) hours.        Marland Kitchen dexlansoprazole (DEXILANT) 60 MG capsule Take 1 capsule (60 mg total) by mouth daily.  30 capsule  6  . eletriptan (RELPAX) 40 MG tablet One tablet by mouth as needed for migraine headache.  If the headache improves and then returns, dose may be repeated after 2 hours have elapsed since first dose (do not exceed 80 mg per day). may repeat in 2 hours if necessary       . fentaNYL (DURAGESIC - DOSED MCG/HR) 25 MCG/HR Place 1 patch onto the skin every other day.       . fluocinolone (DERMA-SMOOTHE/FS BODY) 0.01 % external oil Apply topically 3 (three) times daily.        . fluocinonide (LIDEX) 0.05 % cream Apply topically. 3 times a week       . gabapentin (NEURONTIN) 300 MG capsule Take 300 mg by  mouth 3 (three) times daily.       . hydrochlorothiazide (HYDRODIURIL) 25 MG tablet Take 25 mg by mouth daily.        Marland Kitchen HYPERCARE 20 % external solution       . Loteprednol Etabonate (LOTEMAX) 0.5 % OINT Apply to eye. 1 drop into affected eye twice a day       . lurasidone (LATUDA) 40 MG TABS Take by mouth at bedtime.      . methocarbamol (ROBAXIN) 500 MG tablet Take 500 mg by mouth 3 (three) times daily.        . NON FORMULARY Apply 1 application topically 2 (two) times daily. 2 %Baclofen-2%Cyclobenzaprine-3%Diclofenac-2%Gabapentin-6%tetracaine ointment-use as directed      . Olopatadine HCl (PATADAY) 0.2 % SOLN Apply 1 drop to eye daily as needed. As needed for allergy irritation.      . OnabotulinumtoxinA (BOTOX IJ) Inject as directed every 3 (three) months.      . ondansetron (ZOFRAN) 4 MG tablet Take 1 tablet (4 mg total) by mouth every 6 (six) hours.  12 tablet  0  . pantoprazole (PROTONIX) 20 MG tablet Take 20 mg by mouth daily.      . polyethylene glycol (MIRALAX / GLYCOLAX) packet Take 17 g by mouth daily as needed. As d      . pramipexole (MIRAPEX) 0.5 MG tablet Take 0.5 mg by mouth. 1 tablet at bedtime       . Sodium Oxybate 500 MG/ML SOLN Take 9 mLs (4,500 mg total) by mouth 2 (two) times daily.  270 mL  0  . topiramate (TOPAMAX) 100 MG tablet 100 mg. 1 tab every AM and 2 at night      . traMADol (ULTRAM) 50 MG tablet Take 1 tablet (50 mg total) by mouth every 6 (six) hours as needed for pain.  15 tablet  0  . triamcinolone cream (KENALOG) 0.1 %       . verapamil (VERELAN PM) 240 MG 24 hr capsule Take 240 mg by mouth at bedtime.         No current facility-administered medications for this visit.    Review of Systems Review of Systems  Constitutional: Positive for chills and unexpected weight change. Negative for fever.  HENT: Negative for congestion, hearing loss, sore throat, trouble swallowing  and voice change.   Eyes: Positive for visual disturbance.  Respiratory: Positive  for wheezing. Negative for cough.   Cardiovascular: Negative for chest pain, palpitations and leg swelling.  Gastrointestinal: Positive for nausea, vomiting and abdominal pain. Negative for diarrhea, constipation, blood in stool, abdominal distention and anal bleeding.  Genitourinary: Negative for hematuria, vaginal bleeding and difficulty urinating.  Musculoskeletal: Positive for arthralgias.  Skin: Negative for rash and wound.  Neurological: Positive for weakness and headaches. Negative for seizures and syncope.  Hematological: Negative for adenopathy. Does not bruise/bleed easily.  Psychiatric/Behavioral: Negative for confusion.    Blood pressure 142/66, pulse 72, resp. rate 16, height 5\' 2"  (1.575 m), weight 177 lb 4.8 oz (80.423 kg).  Physical Exam Physical Exam  Constitutional: She is oriented to person, place, and time. She appears well-developed and well-nourished. No distress.  HENT:  Head: Normocephalic and atraumatic.  Right Ear: External ear normal.  Left Ear: External ear normal.  Nose: Nose normal.  Mouth/Throat: Oropharynx is clear and moist. No oropharyngeal exudate.  Eyes: Conjunctivae are normal. Pupils are equal, round, and reactive to light. Right eye exhibits no discharge. Left eye exhibits no discharge. No scleral icterus.  Neck: Normal range of motion. Neck supple. No tracheal deviation present.  Cardiovascular: Normal rate, regular rhythm, normal heart sounds and intact distal pulses.   No murmur heard. Pulmonary/Chest: Breath sounds normal. No respiratory distress. She has no wheezes.  Abdominal: Soft. Bowel sounds are normal. There is tenderness. There is guarding.  There is mild tenderness with guarding in the epigastrium and right upper quadrant  Musculoskeletal: Normal range of motion. She exhibits no edema and no tenderness.  Lymphadenopathy:    She has no cervical adenopathy.  Neurological: She is alert and oriented to person, place, and time.  Skin:  Skin is warm and dry. No rash noted. She is not diaphoretic. No erythema.  Psychiatric: [name] behavior is normal. Judgment normal.    Data Reviewed I have reviewed all [name] data including [name] CAT scans, ultrasound, and upper GI studies. She does have a thickened gallbladder wall and a gallbladder polyp  Assessment    Chronic cholecystitis     Plan    I do suspect she has chronic cholecystitis. I am recommending a laparoscopic cholecystectomy. I have discussed this with [name] in detail. I discussed the surgical procedure. I discussed the risks which includes but is not limited to bleeding, infection, bile duct injury, bile leak, the need to convert to an open procedure, injury to surrounding structures, and the chance this may not resolve [name] symptoms. I also discussed postoperative recovery. She understands and wishes to proceed.        Kainat Pizana A 07/02/2013, 10:38 AM

## 2013-07-04 ENCOUNTER — Ambulatory Visit
Admission: RE | Admit: 2013-07-04 | Discharge: 2013-07-04 | Disposition: A | Discharge status: Home / Self Care | Payer: Self-pay | Source: Ambulatory Visit | Attending: Orthopedic Surgery | Admitting: Orthopedic Surgery

## 2013-07-04 ENCOUNTER — Ambulatory Visit
Admission: RE | Admit: 2013-07-04 | Discharge: 2013-07-04 | Disposition: A | Discharge status: Home / Self Care | Payer: Federal, State, Local not specified - PPO | Source: Ambulatory Visit | Attending: Orthopedic Surgery | Admitting: Orthopedic Surgery

## 2013-07-04 ENCOUNTER — Other Ambulatory Visit: Payer: Self-pay | Admitting: Orthopedic Surgery

## 2013-07-04 VITALS — BP 110/65 | HR 75

## 2013-07-04 DIAGNOSIS — M961 Postlaminectomy syndrome, not elsewhere classified: Secondary | ICD-10-CM

## 2013-07-04 DIAGNOSIS — M549 Dorsalgia, unspecified: Secondary | ICD-10-CM

## 2013-07-04 MED ORDER — DIAZEPAM 5 MG PO TABS
10.0000 mg | ORAL_TABLET | Freq: Once | ORAL | Status: AC
  Administered 2013-07-04: 10 mg via ORAL

## 2013-07-04 MED ORDER — MEPERIDINE HCL 100 MG/ML IJ SOLN
100.0000 mg | Freq: Once | INTRAMUSCULAR | Status: AC
  Administered 2013-07-04: 100 mg via INTRAMUSCULAR

## 2013-07-04 MED ORDER — ONDANSETRON HCL 4 MG/2ML IJ SOLN
4.0000 mg | Freq: Once | INTRAMUSCULAR | Status: AC
  Administered 2013-07-04: 4 mg via INTRAMUSCULAR

## 2013-07-04 MED ORDER — IOHEXOL 180 MG/ML  SOLN
20.0000 mL | Freq: Once | INTRAMUSCULAR | Status: AC | PRN
  Administered 2013-07-04: 20 mL via INTRATHECAL

## 2013-07-04 NOTE — Progress Notes (Signed)
Pt states she has been off adderall for the past 2 days.

## 2013-07-10 ENCOUNTER — Encounter (HOSPITAL_COMMUNITY): Payer: Self-pay | Admitting: Pharmacy Technician

## 2013-07-13 ENCOUNTER — Encounter (HOSPITAL_COMMUNITY): Payer: Self-pay

## 2013-07-13 ENCOUNTER — Encounter (HOSPITAL_COMMUNITY)
Admission: RE | Admit: 2013-07-13 | Discharge: 2013-07-13 | Disposition: A | Discharge status: Home / Self Care | Payer: Federal, State, Local not specified - PPO | Source: Ambulatory Visit | Attending: Surgery | Admitting: Surgery

## 2013-07-13 ENCOUNTER — Encounter (HOSPITAL_COMMUNITY)
Admission: RE | Admit: 2013-07-13 | Discharge: 2013-07-13 | Disposition: A | Discharge status: Home / Self Care | Payer: Federal, State, Local not specified - PPO | Source: Ambulatory Visit | Attending: Anesthesiology | Admitting: Anesthesiology

## 2013-07-13 DIAGNOSIS — Z01812 Encounter for preprocedural laboratory examination: Secondary | ICD-10-CM | POA: Insufficient documentation

## 2013-07-13 DIAGNOSIS — Z01818 Encounter for other preprocedural examination: Secondary | ICD-10-CM | POA: Insufficient documentation

## 2013-07-13 DIAGNOSIS — Z01810 Encounter for preprocedural cardiovascular examination: Secondary | ICD-10-CM | POA: Insufficient documentation

## 2013-07-13 HISTORY — DX: Sleep apnea, unspecified: G47.30

## 2013-07-13 LAB — BASIC METABOLIC PANEL
BUN: 6 mg/dL (ref 6–23)
CO2: 28 mEq/L (ref 19–32)
Chloride: 104 mEq/L (ref 96–112)
GFR calc non Af Amer: 88 mL/min — ABNORMAL LOW (ref >90)
Glucose, Bld: 94 mg/dL (ref 70–99)
Potassium: 3.7 mEq/L (ref 3.5–5.1)
Sodium: 141 mEq/L (ref 135–145)

## 2013-07-13 LAB — CBC
Hemoglobin: 12.9 g/dL (ref 12.0–15.0)
MCH: 28 pg (ref 26.0–34.0)
MCHC: 35.6 g/dL (ref 30.0–36.0)
MCV: 78.5 fL (ref 78.0–100.0)
Platelets: 259 10*3/uL (ref 150–400)
RBC: 4.61 MIL/uL (ref 3.87–5.11)

## 2013-07-13 NOTE — Pre-Procedure Instructions (Signed)
[name]  07/13/2013   Your procedure is scheduled on:  November 14  Report to [facility] Entrance "A" [address] at [employer] AM.  Call this number if you have problems the morning of surgery: [phone]   Remember:   Do not eat food or drink liquids after midnight.   Take these medicines the morning of surgery with A SIP OF WATER: Adderall, Symbicort, Eye drops, fentanyl patch, Gabapentin, Zofran (if needed), protonix,  Topamax, Amitiza, Mirabegron,    STOP Celebrex today   STOP Aspirin, Aleve, Naproxen, Advil, Ibuprofen, Vitamin, Herbs, and Supplements starting today   Do not wear jewelry, make-up or nail polish.  Do not wear lotions, powders, or perfumes. You may wear deodorant.  Do not shave 48 hours prior to surgery. Men may shave face and neck.  Do not bring valuables to the hospital.  [facility] is not responsible                  for any belongings or valuables.               Contacts, dentures or bridgework may not be worn into surgery.  Leave suitcase in the car. After surgery it may be brought to your room.  For patients admitted to the hospital, discharge time is determined by your                treatment team.               Patients discharged the day of surgery will not be allowed to drive  home.  Name and phone number of your driver: Family/ Friend  Special Instructions: Shower using CHG 2 nights before surgery and the night before surgery.  If you shower the day of surgery use CHG.  Use special wash - you have one bottle of CHG for all showers.  You should use approximately 1/3 of the bottle for each shower.   Please read over the following fact sheets that you were given: Pain Booklet, Coughing and Deep Breathing and Surgical Site Infection Prevention

## 2013-07-19 MED ORDER — CEFAZOLIN SODIUM-DEXTROSE 2-3 GM-% IV SOLR
2.0000 g | INTRAVENOUS | Status: AC
  Administered 2013-07-20: 2 g via INTRAVENOUS

## 2013-07-20 ENCOUNTER — Encounter (HOSPITAL_COMMUNITY): Payer: Self-pay | Admitting: Certified Registered"

## 2013-07-20 ENCOUNTER — Encounter (HOSPITAL_COMMUNITY): Payer: Federal, State, Local not specified - PPO | Admitting: Anesthesiology

## 2013-07-20 ENCOUNTER — Encounter (HOSPITAL_COMMUNITY)
Admission: RE | Disposition: A | Discharge status: Home / Self Care | Payer: Self-pay | Source: Ambulatory Visit | Attending: Surgery

## 2013-07-20 ENCOUNTER — Ambulatory Visit (HOSPITAL_COMMUNITY)
Admission: RE | Admit: 2013-07-20 | Discharge: 2013-07-20 | Disposition: A | Discharge status: Home / Self Care | Payer: Federal, State, Local not specified - PPO | Source: Ambulatory Visit | Attending: Surgery | Admitting: Surgery

## 2013-07-20 ENCOUNTER — Ambulatory Visit (HOSPITAL_COMMUNITY): Payer: Federal, State, Local not specified - PPO | Admitting: Anesthesiology

## 2013-07-20 DIAGNOSIS — G47419 Narcolepsy without cataplexy: Secondary | ICD-10-CM | POA: Insufficient documentation

## 2013-07-20 DIAGNOSIS — G2581 Restless legs syndrome: Secondary | ICD-10-CM | POA: Insufficient documentation

## 2013-07-20 DIAGNOSIS — K824 Cholesterolosis of gallbladder: Secondary | ICD-10-CM

## 2013-07-20 DIAGNOSIS — I10 Essential (primary) hypertension: Secondary | ICD-10-CM | POA: Insufficient documentation

## 2013-07-20 DIAGNOSIS — F429 Obsessive-compulsive disorder, unspecified: Secondary | ICD-10-CM | POA: Insufficient documentation

## 2013-07-20 DIAGNOSIS — K811 Chronic cholecystitis: Secondary | ICD-10-CM | POA: Insufficient documentation

## 2013-07-20 DIAGNOSIS — M1990 Unspecified osteoarthritis, unspecified site: Secondary | ICD-10-CM | POA: Insufficient documentation

## 2013-07-20 DIAGNOSIS — K219 Gastro-esophageal reflux disease without esophagitis: Secondary | ICD-10-CM | POA: Insufficient documentation

## 2013-07-20 DIAGNOSIS — F411 Generalized anxiety disorder: Secondary | ICD-10-CM | POA: Insufficient documentation

## 2013-07-20 DIAGNOSIS — F259 Schizoaffective disorder, unspecified: Secondary | ICD-10-CM | POA: Insufficient documentation

## 2013-07-20 DIAGNOSIS — F319 Bipolar disorder, unspecified: Secondary | ICD-10-CM | POA: Insufficient documentation

## 2013-07-20 DIAGNOSIS — G4710 Hypersomnia, unspecified: Secondary | ICD-10-CM | POA: Insufficient documentation

## 2013-07-20 DIAGNOSIS — N318 Other neuromuscular dysfunction of bladder: Secondary | ICD-10-CM | POA: Insufficient documentation

## 2013-07-20 DIAGNOSIS — Z981 Arthrodesis status: Secondary | ICD-10-CM | POA: Insufficient documentation

## 2013-07-20 HISTORY — PX: CHOLECYSTECTOMY: SHX55

## 2013-07-20 SURGERY — LAPAROSCOPIC CHOLECYSTECTOMY
Anesthesia: General | Wound class: Clean Contaminated

## 2013-07-20 MED ORDER — LACTATED RINGERS IV SOLN
INTRAVENOUS | Status: DC | PRN
  Administered 2013-07-20 (×2): via INTRAVENOUS

## 2013-07-20 MED ORDER — SODIUM CHLORIDE 0.9 % IR SOLN
Status: DC | PRN
  Administered 2013-07-20: 1000 mL

## 2013-07-20 MED ORDER — 0.9 % SODIUM CHLORIDE (POUR BTL) OPTIME
TOPICAL | Status: DC | PRN
  Administered 2013-07-20: 1000 mL

## 2013-07-20 MED ORDER — LABETALOL HCL 5 MG/ML IV SOLN
INTRAVENOUS | Status: DC | PRN
  Administered 2013-07-20: 10 mg via INTRAVENOUS

## 2013-07-20 MED ORDER — LIDOCAINE HCL (CARDIAC) 20 MG/ML IV SOLN
INTRAVENOUS | Status: DC | PRN
  Administered 2013-07-20: 100 mg via INTRAVENOUS

## 2013-07-20 MED ORDER — HYDROMORPHONE HCL PF 1 MG/ML IJ SOLN
0.2500 mg | INTRAMUSCULAR | Status: DC | PRN
Start: 2013-07-20 — End: 2013-07-20
  Administered 2013-07-20 (×4): 0.5 mg via INTRAVENOUS

## 2013-07-20 MED ORDER — FENTANYL CITRATE 0.05 MG/ML IJ SOLN
INTRAMUSCULAR | Status: DC | PRN
  Administered 2013-07-20: 100 ug via INTRAVENOUS
  Administered 2013-07-20: 150 ug via INTRAVENOUS
  Administered 2013-07-20: 100 ug via INTRAVENOUS
  Administered 2013-07-20: 50 ug via INTRAVENOUS
  Administered 2013-07-20: 100 ug via INTRAVENOUS

## 2013-07-20 MED ORDER — HYDROMORPHONE HCL PF 1 MG/ML IJ SOLN
INTRAMUSCULAR | Status: AC
  Filled 2013-07-20: qty 1

## 2013-07-20 MED ORDER — PROPOFOL 10 MG/ML IV BOLUS
INTRAVENOUS | Status: DC | PRN
  Administered 2013-07-20: 25 mg via INTRAVENOUS
  Administered 2013-07-20: 200 mg via INTRAVENOUS
  Administered 2013-07-20: 25 mg via INTRAVENOUS

## 2013-07-20 MED ORDER — BUPIVACAINE-EPINEPHRINE 0.25% -1:200000 IJ SOLN
INTRAMUSCULAR | Status: DC | PRN
  Administered 2013-07-20: 20 mL

## 2013-07-20 MED ORDER — PROMETHAZINE HCL 25 MG/ML IJ SOLN: 6.2500 mg | INTRAMUSCULAR | Status: DC | PRN

## 2013-07-20 MED ORDER — MIDAZOLAM HCL 5 MG/5ML IJ SOLN
INTRAMUSCULAR | Status: DC | PRN
  Administered 2013-07-20: 2 mg via INTRAVENOUS

## 2013-07-20 MED ORDER — SUCCINYLCHOLINE CHLORIDE 20 MG/ML IJ SOLN
INTRAMUSCULAR | Status: DC | PRN
  Administered 2013-07-20: 100 mg via INTRAVENOUS

## 2013-07-20 MED ORDER — BUPIVACAINE-EPINEPHRINE PF 0.25-1:200000 % IJ SOLN
INTRAMUSCULAR | Status: AC
  Filled 2013-07-20: qty 30

## 2013-07-20 MED ORDER — HYDROCODONE-ACETAMINOPHEN 5-325 MG PO TABS: 1.0000 | ORAL_TABLET | ORAL | Status: DC | PRN

## 2013-07-20 SURGICAL SUPPLY — 33 items
APPLIER CLIP 5 13 M/L LIGAMAX5 (MISCELLANEOUS) ×2
BANDAGE ADHESIVE 1X3 (GAUZE/BANDAGES/DRESSINGS) ×8 IMPLANT
BENZOIN TINCTURE PRP APPL 2/3 (GAUZE/BANDAGES/DRESSINGS) ×2 IMPLANT
CANISTER SUCTION 2500CC (MISCELLANEOUS) ×2 IMPLANT
CHLORAPREP W/TINT 26ML (MISCELLANEOUS) ×2 IMPLANT
CLIP APPLIE 5 13 M/L LIGAMAX5 (MISCELLANEOUS) ×1 IMPLANT
COVER MAYO STAND STRL (DRAPES) IMPLANT
COVER SURGICAL LIGHT HANDLE (MISCELLANEOUS) ×2 IMPLANT
DECANTER SPIKE VIAL GLASS SM (MISCELLANEOUS) ×2 IMPLANT
DRAPE C-ARM 42X72 X-RAY (DRAPES) IMPLANT
ELECT REM PT RETURN 9FT ADLT (ELECTROSURGICAL) ×2
ELECTRODE REM PT RTRN 9FT ADLT (ELECTROSURGICAL) ×1 IMPLANT
GLOVE SURG SIGNA 7.5 PF LTX (GLOVE) ×2 IMPLANT
GOWN STRL NON-REIN LRG LVL3 (GOWN DISPOSABLE) ×6 IMPLANT
GOWN STRL REIN XL XLG (GOWN DISPOSABLE) ×2 IMPLANT
KIT BASIN OR (CUSTOM PROCEDURE TRAY) ×2 IMPLANT
KIT ROOM TURNOVER OR (KITS) ×2 IMPLANT
NS IRRIG 1000ML POUR BTL (IV SOLUTION) ×2 IMPLANT
PAD ARMBOARD 7.5X6 YLW CONV (MISCELLANEOUS) ×2 IMPLANT
POUCH SPECIMEN RETRIEVAL 10MM (ENDOMECHANICALS) ×2 IMPLANT
SCISSORS LAP 5X35 DISP (ENDOMECHANICALS) IMPLANT
SET CHOLANGIOGRAPH 5 50 .035 (SET/KITS/TRAYS/PACK) IMPLANT
SET IRRIG TUBING LAPAROSCOPIC (IRRIGATION / IRRIGATOR) ×2 IMPLANT
SLEEVE ENDOPATH XCEL 5M (ENDOMECHANICALS) ×4 IMPLANT
SPECIMEN JAR SMALL (MISCELLANEOUS) ×2 IMPLANT
STRIP CLOSURE SKIN 1/2X4 (GAUZE/BANDAGES/DRESSINGS) ×2 IMPLANT
SUT MON AB 4-0 PC3 18 (SUTURE) ×2 IMPLANT
TOWEL OR 17X24 6PK STRL BLUE (TOWEL DISPOSABLE) ×2 IMPLANT
TOWEL OR 17X26 10 PK STRL BLUE (TOWEL DISPOSABLE) ×2 IMPLANT
TRAY LAPAROSCOPIC (CUSTOM PROCEDURE TRAY) ×2 IMPLANT
TROCAR XCEL BLUNT TIP 100MML (ENDOMECHANICALS) ×2 IMPLANT
TROCAR XCEL NON-BLD 5MMX100MML (ENDOMECHANICALS) ×2 IMPLANT
WATER STERILE IRR 1000ML POUR (IV SOLUTION) IMPLANT

## 2013-07-20 NOTE — Op Note (Signed)
Laparoscopic Cholecystectomy Procedure Note  Indications: This patient presents with symptomatic gallbladder disease and will undergo laparoscopic cholecystectomy.  Pre-operative Diagnosis: chronic cholecystitis  Post-operative Diagnosis: Same  Surgeon: Abigail Miyamoto A   Assistants: 0  Anesthesia: General endotracheal anesthesia  ASA Class: 2  Procedure Details  The patient was seen again in the Holding Room. The risks, benefits, complications, treatment options, and expected outcomes were discussed with the patient. The possibilities of reaction to medication, pulmonary aspiration, perforation of viscus, bleeding, recurrent infection, finding a normal gallbladder, the need for additional procedures, failure to diagnose a condition, the possible need to convert to an open procedure, and creating a complication requiring transfusion or operation were discussed with the patient. The likelihood of improving the patient's symptoms with return to their baseline status is good.  The patient and/or family concurred with the proposed plan, giving informed consent. The site of surgery properly noted. The patient was taken to Operating Room, identified as [name] and the procedure verified as Laparoscopic Cholecystectomy with Intraoperative Cholangiogram. A Time Out was held and the above information confirmed.  Prior to the induction of general anesthesia, antibiotic prophylaxis was administered. General endotracheal anesthesia was then administered and tolerated well. After the induction, the abdomen was prepped with Chloraprep and draped in sterile fashion. The patient was positioned in the supine position.  Local anesthetic agent was injected into the skin near the umbilicus and an incision made. We dissected down to the abdominal fascia with blunt dissection.  The fascia was incised vertically and we entered the peritoneal cavity bluntly.  A pursestring suture of 0-Vicryl was placed around  the fascial opening.  The Hasson cannula was inserted and secured with the stay suture.  Pneumoperitoneum was then created with CO2 and tolerated well without any adverse changes in the patient's vital signs. An 11-mm port was placed in the subxiphoid position.  Two 5-mm ports were placed in the right upper quadrant. All skin incisions were infiltrated with a local anesthetic agent before making the incision and placing the trocars.   We positioned the patient in reverse Trendelenburg, tilted slightly to the patient's left.  The gallbladder was identified and found to be thick walled with multiple adhesions consistent with chronic cholecystitis.  The fundus of the gallbladder was grasped and retracted cephalad. Adhesions were lysed bluntly and with the electrocautery where indicated, taking care not to injure any adjacent organs or viscus. The infundibulum was grasped and retracted laterally, exposing the peritoneum overlying the triangle of Calot. This was then divided and exposed in a blunt fashion. The cystic duct was clearly identified and bluntly dissected circumferentially. A critical view of the cystic duct and cystic artery was obtained.  The cystic duct was then ligated with clips and divided. The cystic artery was, dissected free, ligated with clips and divided as well.   The gallbladder was dissected from the liver bed in retrograde fashion with the electrocautery. The gallbladder was removed and placed in an Endocatch sac. The liver bed was irrigated and inspected. Hemostasis was achieved with the electrocautery. Copious irrigation was utilized and was repeatedly aspirated until clear.  The gallbladder and Endocatch sac were then removed through the umbilical port site.  The pursestring suture was used to close the umbilical fascia.    We again inspected the right upper quadrant for hemostasis.  Pneumoperitoneum was released as we removed the trocars.  4-0 Monocryl was used to close the skin.    Benzoin, steri-strips, and clean dressings were applied.  The patient was then extubated and brought to the recovery room in stable condition. Instrument, sponge, and needle counts were correct at closure and at the conclusion of the case.   Findings: Cholecystitis without Cholelithiasis  Estimated Blood Loss: Minimal         Drains: 0         Specimens: Gallbladder           Complications: None; patient tolerated the procedure well.         Disposition: PACU - hemodynamically stable.         Condition: stable

## 2013-07-20 NOTE — Anesthesia Procedure Notes (Signed)
Procedure Name: Intubation Date/Time: 07/20/2013 7:25 AM Performed by: Arlice Colt B Pre-anesthesia Checklist: Patient identified, Emergency Drugs available, Suction available, Patient being monitored and Timeout performed Patient Re-evaluated:Patient Re-evaluated prior to inductionOxygen Delivery Method: Circle system utilized Preoxygenation: Pre-oxygenation with 100% oxygen Intubation Type: IV induction and Rapid sequence Laryngoscope Size: Mac and 4 Grade View: Grade I Tube type: Oral Tube size: 7.5 mm Number of attempts: 1 Airway Equipment and Method: Stylet Placement Confirmation: ETT inserted through vocal cords under direct vision,  positive ETCO2 and breath sounds checked- equal and bilateral Secured at: 21 cm Tube secured with: Tape Dental Injury: Teeth and Oropharynx as per pre-operative assessment

## 2013-07-20 NOTE — Anesthesia Postprocedure Evaluation (Signed)
Anesthesia Post Note  Patient: [name]  Procedure(s) Performed: Procedure(s) (LRB): LAPAROSCOPIC CHOLECYSTECTOMY (N/A)  Anesthesia type: general  Patient location: PACU  Post pain: Pain level controlled  Post assessment: Patient's Cardiovascular Status Stable  Last Vitals:  Filed Vitals:   07/20/13 0900  BP: 148/89  Pulse: 77  Temp: 36.6 C  Resp: 17    Post vital signs: Reviewed and stable  Level of consciousness: sedated  Complications: No apparent anesthesia complications

## 2013-07-20 NOTE — Transfer of Care (Signed)
Immediate Anesthesia Transfer of Care Note  Patient: [name]  Procedure(s) Performed: Procedure(s): LAPAROSCOPIC CHOLECYSTECTOMY (N/A)  Patient Location: PACU  Anesthesia Type:General  Level of Consciousness: awake, alert  and oriented  Airway & Oxygen Therapy: Patient Spontanous Breathing and Patient connected to nasal cannula oxygen  Post-op Assessment: Report given to PACU RN and Post -op Vital signs reviewed and stable  Post vital signs: Reviewed and stable  Complications: No apparent anesthesia complications

## 2013-07-20 NOTE — H&P (Signed)
Patient ID: [name], female DOB: 08-10-1971, 42 y.o. MRN: [id number]  Chief Complaint   Patient presents with   .  New Evaluation     eval GB   HPI  [name] is a 42 y.o. female.  HPI  This is a pleasant female referred by Dr. Shon Baton and Dr. Sherlyn Lick for evaluation of epigastric abdominal pain. She has had discomfort for several months. The pain moved to the right upper quadrant and through to the back. It is intermittent and sharp. She has nausea and vomiting. It is exacerbated by fatty and greasy meals. She is otherwise without complaints. The pain is moderate in intensity.  Past Medical History   Diagnosis  Date   .  Hypertension    .  Overactive bladder    .  Esophageal reflux    .  Migraine    .  Psychosis    .  Schizophrenia, schizo-affective    .  OCD (obsessive compulsive disorder)    .  Bipolar 1 disorder    .  Osteoarthritis    .  Restless leg syndrome    .  S/P lumbar fusion  2011   .  Torn rotator cuff  it:2012,rt:2013   .  Migraines    .  Depression      excessive daytime sleepiness-MSLT confirmed pathological degree of hypersomnia-12/13   .  Anxiety    .  Narcolepsy    .  Migraine    .  Hypersomnia     Past Surgical History   Procedure  Laterality  Date   .  Neck surgery   2009, 2011   .  Vesicovaginal fistula closure w/ tah   2001   .  Tubal ligation   2004   .  Shoulder surgery       left, bone spurs, torn muscle   .  Abdominal hysterectomy   2001   .  Spurs,ruptured disc bone   2009   .  Lumbar fusion   2011   .  Rotator cuff repair   2012,2013     torn   .  Oophorectomy     .  Ankle fusion      Family History   Problem  Relation  Age of Onset   .  Cancer  Mother      breast   .  Cancer  Father      prostate,colon   .  Hypertension  Father    .  Thyroid disease  Father    .  High Cholesterol  Father    .  Thyroid disease  Sister    .  Cancer  Paternal Aunt      colon   .  Cancer  Maternal Grandmother      kidney   .  Cancer   Paternal Grandmother      colon   .  Cancer  Paternal Aunt      lung   Social History  History   Substance Use Topics   .  Smoking status:  Never Smoker   .  Smokeless tobacco:  Never Used   .  Alcohol Use:  Yes      Comment: occ    Allergies   Allergen  Reactions   .  Dust Mite Extract    .  Mold Extract [Trichophyton]    .  Pollen Extract    .  Aspirin  Nausea Only   .  Benadryl [Diphenhydramine  Hcl]  Other (See Comments)     Makes her hyper, jittery   .  Codeine  Nausea And Vomiting   .  Prednisone  Other (See Comments)     Makes her hyper, jittery    Current Outpatient Prescriptions   Medication  Sig  Dispense  Refill   .  AMITIZA 24 MCG capsule      .  amphetamine-dextroamphetamine (ADDERALL) 10 MG tablet  Take 1 tablet (10 mg total) by mouth daily.  30 tablet  0   .  budesonide-formoterol (SYMBICORT) 160-4.5 MCG/ACT inhaler  Inhale 2 puffs into the lungs 2 (two) times daily.     .  celecoxib (CELEBREX) 200 MG capsule  Take 200 mg by mouth 2 (two) times daily.     .  clobetasol (TEMOVATE) 0.05 % cream  Apply topically 2 (two) times daily.     .  Clobetasol Propionate (CLOBEX) 0.05 % shampoo  Apply topically every 7 (seven) days. Once daily     .  clotrimazole-betamethasone (LOTRISONE) lotion      .  cycloSPORINE (RESTASIS) 0.05 % ophthalmic emulsion  Place 1 drop into both eyes every 12 (twelve) hours.     Marland Kitchen  dexlansoprazole (DEXILANT) 60 MG capsule  Take 1 capsule (60 mg total) by mouth daily.  30 capsule  6   .  eletriptan (RELPAX) 40 MG tablet  One tablet by mouth as needed for migraine headache. If the headache improves and then returns, dose may be repeated after 2 hours have elapsed since first dose (do not exceed 80 mg per day). may repeat in 2 hours if necessary     .  fentaNYL (DURAGESIC - DOSED MCG/HR) 25 MCG/HR  Place 1 patch onto the skin every other day.     .  fluocinolone (DERMA-SMOOTHE/FS BODY) 0.01 % external oil  Apply topically 3 (three) times daily.      .  fluocinonide (LIDEX) 0.05 % cream  Apply topically. 3 times a week     .  gabapentin (NEURONTIN) 300 MG capsule  Take 300 mg by mouth 3 (three) times daily.     .  hydrochlorothiazide (HYDRODIURIL) 25 MG tablet  Take 25 mg by mouth daily.     Marland Kitchen  HYPERCARE 20 % external solution      .  Loteprednol Etabonate (LOTEMAX) 0.5 % OINT  Apply to eye. 1 drop into affected eye twice a day     .  lurasidone (LATUDA) 40 MG TABS  Take by mouth at bedtime.     .  methocarbamol (ROBAXIN) 500 MG tablet  Take 500 mg by mouth 3 (three) times daily.     .  NON FORMULARY  Apply 1 application topically 2 (two) times daily. 2 %Baclofen-2%Cyclobenzaprine-3%Diclofenac-2%Gabapentin-6%tetracaine ointment-use as directed     .  Olopatadine HCl (PATADAY) 0.2 % SOLN  Apply 1 drop to eye daily as needed. As needed for allergy irritation.     .  OnabotulinumtoxinA (BOTOX IJ)  Inject as directed every 3 (three) months.     .  ondansetron (ZOFRAN) 4 MG tablet  Take 1 tablet (4 mg total) by mouth every 6 (six) hours.  12 tablet  0   .  pantoprazole (PROTONIX) 20 MG tablet  Take 20 mg by mouth daily.     .  polyethylene glycol (MIRALAX / GLYCOLAX) packet  Take 17 g by mouth daily as needed. As d     .  pramipexole (MIRAPEX) 0.5 MG tablet  Take 0.5 mg by mouth. 1 tablet at bedtime     .  Sodium Oxybate 500 MG/ML SOLN  Take 9 mLs (4,500 mg total) by mouth 2 (two) times daily.  270 mL  0   .  topiramate (TOPAMAX) 100 MG tablet  100 mg. 1 tab every AM and 2 at night     .  traMADol (ULTRAM) 50 MG tablet  Take 1 tablet (50 mg total) by mouth every 6 (six) hours as needed for pain.  15 tablet  0   .  triamcinolone cream (KENALOG) 0.1 %      .  verapamil (VERELAN PM) 240 MG 24 hr capsule  Take 240 mg by mouth at bedtime.      No current facility-administered medications for this visit.   Review of Systems  Review of Systems  Constitutional: Positive for chills and unexpected weight change. Negative for fever.  HENT: Negative for  congestion, hearing loss, sore throat, trouble swallowing and voice change.  Eyes: Positive for visual disturbance.  Respiratory: Positive for wheezing. Negative for cough.  Cardiovascular: Negative for chest pain, palpitations and leg swelling.  Gastrointestinal: Positive for nausea, vomiting and abdominal pain. Negative for diarrhea, constipation, blood in stool, abdominal distention and anal bleeding.  Genitourinary: Negative for hematuria, vaginal bleeding and difficulty urinating.  Musculoskeletal: Positive for arthralgias.  Skin: Negative for rash and wound.  Neurological: Positive for weakness and headaches. Negative for seizures and syncope.  Hematological: Negative for adenopathy. Does not bruise/bleed easily.  Psychiatric/Behavioral: Negative for confusion.  Blood pressure 142/66, pulse 72, resp. rate 16, height 5\' 2"  (1.575 m), weight 177 lb 4.8 oz (80.423 kg).  Physical Exam  Physical Exam  Constitutional: She is oriented to person, place, and time. She appears well-developed and well-nourished. No distress.  HENT:  Head: Normocephalic and atraumatic.  Right Ear: External ear normal.  Left Ear: External ear normal.  Nose: Nose normal.  Mouth/Throat: Oropharynx is clear and moist. No oropharyngeal exudate.  Eyes: Conjunctivae are normal. Pupils are equal, round, and reactive to light. Right eye exhibits no discharge. Left eye exhibits no discharge. No scleral icterus.  Neck: Normal range of motion. Neck supple. No tracheal deviation present.  Cardiovascular: Normal rate, regular rhythm, normal heart sounds and intact distal pulses.  No murmur heard.  Pulmonary/Chest: Breath sounds normal. No respiratory distress. She has no wheezes.  Abdominal: Soft. Bowel sounds are normal. There is tenderness. There is guarding.  There is mild tenderness with guarding in the epigastrium and right upper quadrant  Musculoskeletal: Normal range of motion. She exhibits no edema and no tenderness.   Lymphadenopathy:  She has no cervical adenopathy.  Neurological: She is alert and oriented to person, place, and time.  Skin: Skin is warm and dry. No rash noted. She is not diaphoretic. No erythema.  Psychiatric: Her behavior is normal. Judgment normal.   Data Reviewed  I have reviewed all her data including her CAT scans, ultrasound, and upper GI studies. She does have a thickened gallbladder wall and a gallbladder polyp   Assessment  Chronic cholecystitis   Plan  I do suspect she has chronic cholecystitis. I am recommending a laparoscopic cholecystectomy. I have discussed this with her in detail. I discussed the surgical procedure. I discussed the risks which includes but is not limited to bleeding, infection, bile duct injury, bile leak, the need to convert to an open procedure, injury to surrounding structures, and the chance this may not resolve her symptoms. I  also discussed postoperative recovery. She understands and wishes to proceed.

## 2013-07-20 NOTE — Anesthesia Preprocedure Evaluation (Addendum)
Anesthesia Evaluation  Patient identified by MRN, date of birth, ID band Patient awake    Reviewed: Allergy & Precautions, H&P , NPO status , Patient's Chart, lab work & pertinent test results  History of Anesthesia Complications Negative for: history of anesthetic complications  Airway Mallampati: II TM Distance: >3 FB Neck ROM: Full    Dental  (+) Teeth Intact and Dental Advisory Given   Pulmonary shortness of breath, sleep apnea and Continuous Positive Airway Pressure Ventilation ,    Pulmonary exam normal       Cardiovascular hypertension, Pt. on medications     Neuro/Psych  Headaches, PSYCHIATRIC DISORDERS Anxiety Depression Bipolar Disorder    GI/Hepatic Neg liver ROS, GERD-  Medicated,  Endo/Other  negative endocrine ROS  Renal/GU negative Renal ROS     Musculoskeletal   Abdominal   Peds  Hematology   Anesthesia Other Findings   Reproductive/Obstetrics                          Anesthesia Physical Anesthesia Plan  ASA: II  Anesthesia Plan: General   Post-op Pain Management:    Induction: Intravenous  Airway Management Planned: Oral ETT  Additional Equipment:   Intra-op Plan:   Post-operative Plan: Extubation in OR  Informed Consent: I have reviewed the patients History and Physical, chart, labs and discussed the procedure including the risks, benefits and alternatives for the proposed anesthesia with the patient or authorized representative who has indicated his/her understanding and acceptance.   Dental advisory given  Plan Discussed with: CRNA, Anesthesiologist and Surgeon  Anesthesia Plan Comments:        Anesthesia Quick Evaluation

## 2013-07-23 ENCOUNTER — Encounter (HOSPITAL_COMMUNITY): Payer: Self-pay | Admitting: Surgery

## 2013-07-25 ENCOUNTER — Ambulatory Visit (INDEPENDENT_AMBULATORY_CARE_PROVIDER_SITE_OTHER): Payer: Federal, State, Local not specified - PPO | Admitting: Surgery

## 2013-07-25 ENCOUNTER — Encounter (INDEPENDENT_AMBULATORY_CARE_PROVIDER_SITE_OTHER): Payer: Self-pay | Admitting: Surgery

## 2013-07-25 VITALS — BP 128/80 | HR 68 | Temp 97.7°F | Resp 14 | Ht 62.0 in | Wt 180.0 lb

## 2013-07-25 DIAGNOSIS — Z09 Encounter for follow-up examination after completed treatment for conditions other than malignant neoplasm: Secondary | ICD-10-CM

## 2013-07-25 NOTE — Progress Notes (Signed)
She comes in for a postop laparoscopic cholecystectomy visit to the urgent office because of some rash and itching around her incisions. This developed over the last couple of days where the Steri-Strips are. She's never had trouble with Steri-Strips before. She also notes she is not eating well yet and still sore at the incisions has not had a bowel movement yet.she says that she can't take Benadryl because of side effects.  Exam: Vital signs:BP 128/80  Pulse 68  Temp(Src) 97.7 F (36.5 C) (Temporal)  Resp 14  Ht 5\' 2"  (1.575 m)  Wt 180 lb (81.647 kg)  BMI 32.91 kg/m2 Gen.: Patient alert oriented does not appear in distress Abdomen: Soft and completely benign. However there is a vesicular rash in each of the Steri-Strips sites. This does not extend beyond the Steri-Strips. Incisions are otherwise healing nicely with no evidence of infection.  Impression: Apparent contact dermatitis from Steri-Strips; slow progress from arthroscopic cholecystectomy  Plan: I removed the Steri-Strips. Office and Dermabond directly an incision but did not apply to the rash. She will start using cortisone cream on the rash. She'll take a laxative to see if this stimulates a bowel movement. She'll follow up with Dr. Magnus Ivan as scheduled. She'll call us if she has any problems between now and then.

## 2013-07-25 NOTE — Patient Instructions (Signed)
Apply some cortisone cream to the rash runny to the incisions from your gallbladder surgery

## 2013-07-30 ENCOUNTER — Telehealth: Payer: Self-pay | Admitting: *Deleted

## 2013-07-30 NOTE — Telephone Encounter (Signed)
Message was sent to the front for the patient to be scheduled with Dr. Desiree Hane since she is on Xyrem. The patient was left a message about the change.

## 2013-08-01 ENCOUNTER — Ambulatory Visit: Payer: Federal, State, Local not specified - PPO | Admitting: Nurse Practitioner

## 2013-08-07 ENCOUNTER — Encounter (INDEPENDENT_AMBULATORY_CARE_PROVIDER_SITE_OTHER): Payer: Self-pay | Admitting: Surgery

## 2013-08-07 ENCOUNTER — Ambulatory Visit (INDEPENDENT_AMBULATORY_CARE_PROVIDER_SITE_OTHER): Payer: Federal, State, Local not specified - PPO | Admitting: Surgery

## 2013-08-07 VITALS — BP 138/90 | HR 88 | Temp 98.2°F | Resp 15 | Ht 62.0 in | Wt 171.2 lb

## 2013-08-07 DIAGNOSIS — Z09 Encounter for follow-up examination after completed treatment for conditions other than malignant neoplasm: Secondary | ICD-10-CM

## 2013-08-07 NOTE — Progress Notes (Signed)
Subjective:     Patient ID: [name], female   DOB: 1971-04-08, 42 y.o.   MRN: [id number]  HPI She is here for another postop visit. She feels much better and is tolerating a regular diet.  Review of Systems     Objective:   Physical Exam On exam, her incisions are healing well. The final pathology showed mild chronic cholecystitis    Assessment:     Patient stable postop     Plan:     She may resume normal activity. I will see her back as needed

## 2013-08-09 ENCOUNTER — Encounter (HOSPITAL_COMMUNITY): Payer: Self-pay | Admitting: Emergency Medicine

## 2013-08-09 ENCOUNTER — Telehealth (INDEPENDENT_AMBULATORY_CARE_PROVIDER_SITE_OTHER): Payer: Self-pay

## 2013-08-09 ENCOUNTER — Emergency Department (HOSPITAL_COMMUNITY)
Admission: EM | Admit: 2013-08-09 | Discharge: 2013-08-09 | Disposition: A | Discharge status: Home / Self Care | Payer: Federal, State, Local not specified - PPO | Attending: Emergency Medicine | Admitting: Emergency Medicine

## 2013-08-09 ENCOUNTER — Telehealth (INDEPENDENT_AMBULATORY_CARE_PROVIDER_SITE_OTHER): Payer: Self-pay | Admitting: General Surgery

## 2013-08-09 DIAGNOSIS — M1990 Unspecified osteoarthritis, unspecified site: Secondary | ICD-10-CM | POA: Insufficient documentation

## 2013-08-09 DIAGNOSIS — G4730 Sleep apnea, unspecified: Secondary | ICD-10-CM | POA: Insufficient documentation

## 2013-08-09 DIAGNOSIS — R1084 Generalized abdominal pain: Secondary | ICD-10-CM | POA: Insufficient documentation

## 2013-08-09 DIAGNOSIS — I10 Essential (primary) hypertension: Secondary | ICD-10-CM | POA: Insufficient documentation

## 2013-08-09 DIAGNOSIS — Z87448 Personal history of other diseases of urinary system: Secondary | ICD-10-CM | POA: Insufficient documentation

## 2013-08-09 DIAGNOSIS — F259 Schizoaffective disorder, unspecified: Secondary | ICD-10-CM | POA: Insufficient documentation

## 2013-08-09 DIAGNOSIS — Z9851 Tubal ligation status: Secondary | ICD-10-CM | POA: Insufficient documentation

## 2013-08-09 DIAGNOSIS — K529 Noninfective gastroenteritis and colitis, unspecified: Secondary | ICD-10-CM

## 2013-08-09 DIAGNOSIS — F319 Bipolar disorder, unspecified: Secondary | ICD-10-CM | POA: Insufficient documentation

## 2013-08-09 DIAGNOSIS — K5289 Other specified noninfective gastroenteritis and colitis: Secondary | ICD-10-CM | POA: Insufficient documentation

## 2013-08-09 DIAGNOSIS — K219 Gastro-esophageal reflux disease without esophagitis: Secondary | ICD-10-CM | POA: Insufficient documentation

## 2013-08-09 DIAGNOSIS — R109 Unspecified abdominal pain: Secondary | ICD-10-CM

## 2013-08-09 DIAGNOSIS — Z3202 Encounter for pregnancy test, result negative: Secondary | ICD-10-CM | POA: Insufficient documentation

## 2013-08-09 DIAGNOSIS — G2581 Restless legs syndrome: Secondary | ICD-10-CM | POA: Insufficient documentation

## 2013-08-09 DIAGNOSIS — Z87828 Personal history of other (healed) physical injury and trauma: Secondary | ICD-10-CM | POA: Insufficient documentation

## 2013-08-09 DIAGNOSIS — Z981 Arthrodesis status: Secondary | ICD-10-CM | POA: Insufficient documentation

## 2013-08-09 DIAGNOSIS — R197 Diarrhea, unspecified: Secondary | ICD-10-CM | POA: Insufficient documentation

## 2013-08-09 DIAGNOSIS — Z79899 Other long term (current) drug therapy: Secondary | ICD-10-CM | POA: Insufficient documentation

## 2013-08-09 DIAGNOSIS — Z90710 Acquired absence of both cervix and uterus: Secondary | ICD-10-CM | POA: Insufficient documentation

## 2013-08-09 DIAGNOSIS — G43909 Migraine, unspecified, not intractable, without status migrainosus: Secondary | ICD-10-CM | POA: Insufficient documentation

## 2013-08-09 DIAGNOSIS — F411 Generalized anxiety disorder: Secondary | ICD-10-CM | POA: Insufficient documentation

## 2013-08-09 DIAGNOSIS — R112 Nausea with vomiting, unspecified: Secondary | ICD-10-CM

## 2013-08-09 DIAGNOSIS — Z791 Long term (current) use of non-steroidal anti-inflammatories (NSAID): Secondary | ICD-10-CM | POA: Insufficient documentation

## 2013-08-09 LAB — COMPREHENSIVE METABOLIC PANEL
ALT: 13 U/L (ref 0–35)
AST: 19 U/L (ref 0–37)
Calcium: 9.5 mg/dL (ref 8.4–10.5)
Chloride: 98 mEq/L (ref 96–112)
Creatinine, Ser: 0.72 mg/dL (ref 0.50–1.10)
GFR calc Af Amer: >90 mL/min (ref >90)
Glucose, Bld: 111 mg/dL — ABNORMAL HIGH (ref 70–99)
Sodium: 137 mEq/L (ref 135–145)
Total Bilirubin: 0.4 mg/dL (ref 0.3–1.2)
Total Protein: 7.8 g/dL (ref 6.0–8.3)

## 2013-08-09 LAB — LIPASE, BLOOD: Lipase: 51 U/L (ref 11–59)

## 2013-08-09 LAB — CBC WITH DIFFERENTIAL/PLATELET
Basophils Absolute: 0 10*3/uL (ref 0.0–0.1)
Eosinophils Absolute: 0 10*3/uL (ref 0.0–0.7)
Eosinophils Relative: 0 % (ref 0–5)
HCT: 36.2 % (ref 36.0–46.0)
MCH: 28.2 pg (ref 26.0–34.0)
MCV: 79.2 fL (ref 78.0–100.0)
Monocytes Relative: 4 % (ref 3–12)
Neutrophils Relative %: 73 % (ref 43–77)
Platelets: 305 10*3/uL (ref 150–400)
RBC: 4.57 MIL/uL (ref 3.87–5.11)
RDW: 14.5 % (ref 11.5–15.5)
WBC: 10.1 10*3/uL (ref 4.0–10.5)

## 2013-08-09 LAB — URINALYSIS, ROUTINE W REFLEX MICROSCOPIC
Hgb urine dipstick: NEGATIVE
Leukocytes, UA: NEGATIVE
Nitrite: NEGATIVE
Specific Gravity, Urine: 1.026 (ref 1.005–1.030)
Urobilinogen, UA: 1 mg/dL (ref 0.0–1.0)

## 2013-08-09 LAB — POCT PREGNANCY, URINE: Preg Test, Ur: NEGATIVE

## 2013-08-09 MED ORDER — DICYCLOMINE HCL 10 MG/ML IM SOLN
20.0000 mg | Freq: Once | INTRAMUSCULAR | Status: AC
  Administered 2013-08-09: 20 mg via INTRAMUSCULAR
  Filled 2013-08-09: qty 2

## 2013-08-09 MED ORDER — ONDANSETRON 4 MG PO TBDP
8.0000 mg | ORAL_TABLET | Freq: Once | ORAL | Status: AC
  Administered 2013-08-09: 8 mg via ORAL
  Filled 2013-08-09: qty 2

## 2013-08-09 MED ORDER — DICYCLOMINE HCL 20 MG PO TABS: 20.0000 mg | ORAL_TABLET | Freq: Four times a day (QID) | ORAL | Status: DC | PRN

## 2013-08-09 MED ORDER — SODIUM CHLORIDE 0.9 % IV BOLUS (SEPSIS)
1000.0000 mL | Freq: Once | INTRAVENOUS | Status: AC
  Administered 2013-08-09: 1000 mL via INTRAVENOUS

## 2013-08-09 MED ORDER — ONDANSETRON 8 MG PO TBDP: 8.0000 mg | ORAL_TABLET | Freq: Three times a day (TID) | ORAL | Status: DC | PRN

## 2013-08-09 MED ORDER — MORPHINE SULFATE 4 MG/ML IJ SOLN
4.0000 mg | Freq: Once | INTRAMUSCULAR | Status: AC
  Administered 2013-08-09: 4 mg via INTRAVENOUS
  Filled 2013-08-09: qty 1

## 2013-08-09 MED ORDER — HYDROCODONE-ACETAMINOPHEN 5-500 MG PO TABS: 1.0000 | ORAL_TABLET | Freq: Four times a day (QID) | ORAL | Status: DC | PRN

## 2013-08-09 MED ORDER — PROMETHAZINE HCL 25 MG/ML IJ SOLN
12.5000 mg | Freq: Once | INTRAMUSCULAR | Status: AC
  Administered 2013-08-09: 12.5 mg via INTRAVENOUS
  Filled 2013-08-09: qty 1

## 2013-08-09 MED ORDER — FENTANYL CITRATE 0.05 MG/ML IJ SOLN
50.0000 ug | Freq: Once | INTRAMUSCULAR | Status: AC
  Administered 2013-08-09: 50 ug via INTRAVENOUS
  Filled 2013-08-09: qty 2

## 2013-08-09 MED ORDER — ONDANSETRON HCL 4 MG/2ML IJ SOLN
4.0000 mg | Freq: Once | INTRAMUSCULAR | Status: AC
  Administered 2013-08-09: 4 mg via INTRAVENOUS
  Filled 2013-08-09: qty 2

## 2013-08-09 NOTE — ED Notes (Signed)
Tolerating fluid po.  Pain now 7/10.

## 2013-08-09 NOTE — ED Notes (Signed)
Fluids offered.  Pt states she wishes she could go to sleep; while speaking to me her eyes are rolling back and eyelids closing, then she opens them immediately as if she is fighting sleep.

## 2013-08-09 NOTE — ED Notes (Signed)
C/o nausea, vomiting, diarrhea, and generalized abd pain since 9:30pm.  Reports cholecystectomy on 11/14. States symptoms tonight with the exception of diarrhea feels like when she had pain from gallbladder before surgery.

## 2013-08-09 NOTE — ED Notes (Signed)
Pt states that she has been dealing with the same pain for the past year. Pt had gallbladder removed and thought that she would get over the pain in her abdomen. Pt states that she did have diarrhea today which is unusual for her. Pt states that her pain is generalized abdomen.

## 2013-08-09 NOTE — Telephone Encounter (Signed)
If this persists, she will need to go back to the ER to be seen again.  There is nothing we can offer in the office.  She would need her labs repeated and IV fluids

## 2013-08-09 NOTE — ED Provider Notes (Signed)
CSN: [id number]     Arrival date & time 08/09/13  0011 History   First MD Initiated Contact with Patient 08/09/13 0028     Chief Complaint  Patient presents with  . Abdominal Pain   (Consider location/radiation/quality/duration/timing/severity/associated sxs/prior Treatment) HPI 42 year old female presents to emergency department with complaint of nausea, vomiting, and diarrhea, and generalized abdominal pain.  Pt stated it started after eating dinner [employer].  No sick contacts, no fever, no chills.  Patient status post cholecystectomy, November 14.  She reports she's been doing well since the surgery.  Patient denies any urinary symptoms, vaginal discharge.  Past Medical History  Diagnosis Date  . Hypertension   . Overactive bladder   . Esophageal reflux   . Migraine   . Psychosis   . Schizophrenia, schizo-affective   . OCD (obsessive compulsive disorder)   . Bipolar 1 disorder   . Osteoarthritis   . Restless leg syndrome   . S/P lumbar fusion 2011  . Torn rotator cuff it:2012,rt:2013  . Migraines   . Depression     excessive daytime sleepiness-MSLT confirmed pathological degree of hypersomnia-12/13  . Anxiety   . Narcolepsy   . Migraine   . Hypersomnia   . Complication of anesthesia     reports gets cold and itch  . Sleep apnea     uses CPAP   Past Surgical History  Procedure Laterality Date  . Vesicovaginal fistula closure w/ tah  2001  . Tubal ligation  2004  . Shoulder surgery      left, bone spurs, torn muscle  . Abdominal hysterectomy  2001  . Spurs,ruptured disc bone  2009  . Lumbar fusion  2011  . Rotator cuff repair  2012,2013    torn   . Oophorectomy    . Ankle fusion    . Cholecystectomy N/A 07/20/2013    Procedure: LAPAROSCOPIC CHOLECYSTECTOMY;  Surgeon: Shelly Rubenstein, MD;  Location: [facility] OR;  Service: General;  Laterality: N/A;   Family History  Problem Relation Age of Onset  . Cancer Mother     breast  . Cancer Father     prostate,colon   . Hypertension Father   . Thyroid disease Father   . High Cholesterol Father   . Thyroid disease Sister   . Cancer Paternal Aunt     colon  . Cancer Maternal Grandmother     kidney  . Cancer Paternal Grandmother     colon  . Cancer Paternal Aunt     lung   History  Substance Use Topics  . Smoking status: Never Smoker   . Smokeless tobacco: Never Used  . Alcohol Use: No   OB History   Grav Para Term Preterm Abortions TAB SAB Ect Mult Living                 Review of Systems  All other systems reviewed and are negative.    Allergies  Dust mite extract; Mold extract; Pollen extract; Adhesive; Aspirin; Benadryl; Codeine; and Prednisone  Home Medications   Current Outpatient Rx  Name  Route  Sig  Dispense  Refill  . AMITIZA 24 MCG capsule   Oral   Take 24 mcg by mouth daily with breakfast.          . amphetamine-dextroamphetamine (ADDERALL) 10 MG tablet   Oral   Take 1 tablet (10 mg total) by mouth daily.   30 tablet   0   . budesonide-formoterol (SYMBICORT) 160-4.5 MCG/ACT inhaler  Inhalation   Inhale 2 puffs into the lungs 2 (two) times daily.           . celecoxib (CELEBREX) 200 MG capsule   Oral   Take 200 mg by mouth daily.          . clobetasol (TEMOVATE) 0.05 % cream   Topical   Apply 1 application topically 2 (two) times daily.          . Clobetasol Propionate (CLOBEX) 0.05 % shampoo   Topical   Apply 1 application topically daily as needed (for itching).          . clotrimazole-betamethasone (LOTRISONE) lotion   Topical   Apply 1 application topically daily as needed (for rash).          . cycloSPORINE (RESTASIS) 0.05 % ophthalmic emulsion   Both Eyes   Place 1 drop into both eyes every 12 (twelve) hours.           Marland Kitchen eletriptan (RELPAX) 40 MG tablet   Oral   Take 40 mg by mouth every 2 (two) hours as needed for migraine.          . fentaNYL (DURAGESIC - DOSED MCG/HR) 75 MCG/HR   Transdermal   Place 75 mcg onto the skin  every other day.         . fluocinolone (DERMA-SMOOTHE/FS BODY) 0.01 % external oil   Topical   Apply 1 application topically 3 (three) times daily.          . fluocinonide (LIDEX) 0.05 % cream   Topical   Apply 1 application topically 3 (three) times a week.          . gabapentin (NEURONTIN) 300 MG capsule   Oral   Take 300 mg by mouth 3 (three) times daily.          . hydrochlorothiazide (HYDRODIURIL) 25 MG tablet   Oral   Take 25 mg by mouth daily.           Marland Kitchen HYDROcodone-acetaminophen (NORCO) 5-325 MG per tablet   Oral   Take 1-2 tablets by mouth every 4 (four) hours as needed.   30 tablet   0   . HYPERCARE 20 % external solution   Topical   Apply 1 application topically daily as needed (to affected area).          . Loteprednol Etabonate (LOTEMAX) 0.5 % OINT   Ophthalmic   Apply 1 application to eye 2 (two) times daily.          Marland Kitchen lurasidone (LATUDA) 40 MG TABS   Oral   Take 40 mg by mouth at bedtime.          . methocarbamol (ROBAXIN) 500 MG tablet   Oral   Take 500 mg by mouth 3 (three) times daily.           . mirabegron ER (MYRBETRIQ) 50 MG TB24 tablet   Oral   Take 50 mg by mouth daily.         . NON FORMULARY   Topical   Apply 1 application topically 2 (two) times daily. 2 %Baclofen-2%Cyclobenzaprine-3%Diclofenac-2%Gabapentin-6%tetracaine ointment-use as directed         . Olopatadine HCl (PATADAY) 0.2 % SOLN   Ophthalmic   Apply 1 drop to eye daily as needed. As needed for allergy irritation.         . pantoprazole (PROTONIX) 20 MG tablet   Oral   Take 20 mg by mouth  daily.         . polyethylene glycol (MIRALAX / GLYCOLAX) packet   Oral   Take 17 g by mouth daily as needed for moderate constipation.          . pramipexole (MIRAPEX) 0.5 MG tablet   Oral   Take 0.5 mg by mouth at bedtime.          . Sodium Oxybate 500 MG/ML SOLN   Oral   Take 9 mLs by mouth at bedtime.         . topiramate (TOPAMAX) 100 MG  tablet   Oral   Take 100-200 mg by mouth 2 (two) times daily. 1 tab every AM and 2 at night         . triamcinolone cream (KENALOG) 0.1 %   Topical   Apply 1 application topically daily as needed (to affected area).          . verapamil (VERELAN PM) 240 MG 24 hr capsule   Oral   Take 240 mg by mouth at bedtime.           . OnabotulinumtoxinA (BOTOX IJ)   Injection   Inject as directed every 3 (three) months.          BP 165/99  Pulse 90  Temp(Src) 97.8 F (36.6 C) (Oral)  Resp 18  SpO2 97% Physical Exam  Nursing note and vitals reviewed. Constitutional: She is oriented to person, place, and time. She appears well-developed and well-nourished. She appears distressed (uncomfortable appearing).  HENT:  Head: Normocephalic and atraumatic.  Nose: Nose normal.  Eyes: Conjunctivae and EOM are normal. Pupils are equal, round, and reactive to light.  Neck: Normal range of motion. Neck supple. No JVD present. No tracheal deviation present. No thyromegaly present.  Cardiovascular: Normal rate, regular rhythm, normal heart sounds and intact distal pulses.  Exam reveals no gallop and no friction rub.   No murmur heard. Pulmonary/Chest: Effort normal and breath sounds normal. No stridor. No respiratory distress. She has no wheezes. She has no rales. She exhibits no tenderness.  Abdominal: Soft. Bowel sounds are normal. She exhibits no distension and no mass. There is tenderness (diffuse tenderness). There is no rebound and no guarding.  Musculoskeletal: Normal range of motion. She exhibits no edema and no tenderness.  Lymphadenopathy:    She has no cervical adenopathy.  Neurological: She is alert and oriented to person, place, and time. She exhibits normal muscle tone. Coordination normal.  Skin: Skin is warm and dry. No rash noted. No erythema. No pallor.  Psychiatric: She has a normal mood and affect. Her behavior is normal. Judgment and thought content normal.    ED Course   Procedures (including critical care time) Labs Review Labs Reviewed  COMPREHENSIVE METABOLIC PANEL - Abnormal; Notable for the following:    Glucose, Bld 111 (*)    All other components within normal limits  URINALYSIS, ROUTINE W REFLEX MICROSCOPIC - Abnormal; Notable for the following:    APPearance CLOUDY (*)    Ketones, ur 40 (*)    All other components within normal limits  CBC WITH DIFFERENTIAL  LIPASE, BLOOD  POCT PREGNANCY, URINE   Imaging Review No results found.  EKG Interpretation   None       MDM   1. Nausea vomiting and diarrhea   2. Abdominal pain   3. Gastroenteritis    42 year old female with nausea, vomiting, and diarrhea after eating a meal.  Out.  Patient 3 weeks  out from cholecystectomy.  No fevers or chills.  Not particularly tender over right upper quadrant.  On exam.  Possible foodborne illness versus viral gastroenteritis.  Will check labs, give IV fluids, pain, and nausea medicine.  And we'll reassess.  Pt has tolerated po fluids.  Still with some abd pain, but nonfocal.  Suspect gastroenteritis.  Will d/c home with zofran, bentyl, and short course of vicodin.    Olivia Mackie, MD 08/09/13 [phone]

## 2013-08-09 NOTE — ED Notes (Signed)
Pt aware of urine sample need and has call bell when ready to urinate.

## 2013-08-09 NOTE — Discharge Instructions (Signed)
Abdominal Pain  Abdominal pain can be caused by many things. Your caregiver decides the seriousness of your pain by an examination and possibly blood tests and X-rays. Many cases can be observed and treated at home. Most abdominal pain is not caused by a disease and will probably improve without treatment. However, in many cases, more time must pass before a clear cause of the pain can be found. Before that point, it may not be known if you need more testing, or if hospitalization or surgery is needed.  HOME CARE INSTRUCTIONS    Do not take laxatives unless directed by your caregiver.   Take pain medicine only as directed by your caregiver.   Only take over-the-counter or prescription medicines for pain, discomfort, or fever as directed by your caregiver.   Try a clear liquid diet (broth, tea, or water) for as long as directed by your caregiver. Slowly move to a bland diet as tolerated.  SEEK IMMEDIATE MEDICAL CARE IF:    The pain does not go away.   You have a fever.   You keep throwing up (vomiting).   The pain is felt only in portions of the abdomen. Pain in the right side could possibly be appendicitis. In an adult, pain in the left lower portion of the abdomen could be colitis or diverticulitis.   You pass bloody or black tarry stools.  MAKE SURE YOU:    Understand these instructions.   Will watch your condition.   Will get help right away if you are not doing well or get worse.  Document Released: 06/02/2005 Document Revised: 11/15/2011 Document Reviewed: 04/10/2008  [employer] Patient Information 2014 [employer]    Nausea and Vomiting  Nausea is a sick feeling that often comes before throwing up (vomiting). Vomiting is a reflex where stomach contents come out of your mouth. Vomiting can cause severe loss of body fluids (dehydration). Children and elderly adults can become dehydrated quickly, especially if they also have diarrhea. Nausea and vomiting are symptoms of a condition or disease. It  is important to find the cause of your symptoms.  CAUSES    Direct irritation of the stomach lining. This irritation can result from increased acid production (gastroesophageal reflux disease), infection, food poisoning, taking certain medicines (such as nonsteroidal anti-inflammatory drugs), alcohol use, or tobacco use.   Signals from the brain.These signals could be caused by a headache, heat exposure, an inner ear disturbance, increased pressure in the brain from injury, infection, a tumor, or a concussion, pain, emotional stimulus, or metabolic problems.   An obstruction in the gastrointestinal tract (bowel obstruction).   Illnesses such as diabetes, hepatitis, gallbladder problems, appendicitis, kidney problems, cancer, sepsis, atypical symptoms of a heart attack, or eating disorders.   Medical treatments such as chemotherapy and radiation.   Receiving medicine that makes you sleep (general anesthetic) during surgery.  DIAGNOSIS  Your caregiver may ask for tests to be done if the problems do not improve after a few days. Tests may also be done if symptoms are severe or if the reason for the nausea and vomiting is not clear. Tests may include:   Urine tests.   Blood tests.   Stool tests.   Cultures (to look for evidence of infection).   X-rays or other imaging studies.  Test results can help your caregiver make decisions about treatment or the need for additional tests.  TREATMENT  You need to stay well hydrated. Drink frequently but in small amounts.You may wish to   drink water, sports drinks, clear broth, or eat frozen ice pops or gelatin dessert to help stay hydrated.When you eat, eating slowly may help prevent nausea.There are also some antinausea medicines that may help prevent nausea.  HOME CARE INSTRUCTIONS    Take all medicine as directed by your caregiver.   If you do not have an appetite, do not force yourself to eat. However, you must continue to drink fluids.   If you have an  appetite, eat a normal diet unless your caregiver tells you differently.   Eat a variety of complex carbohydrates (rice, wheat, potatoes, bread), lean meats, yogurt, fruits, and vegetables.   Avoid high-fat foods because they are more difficult to digest.   Drink enough water and fluids to keep your urine clear or pale yellow.   If you are dehydrated, ask your caregiver for specific rehydration instructions. Signs of dehydration may include:   Severe thirst.   Dry lips and mouth.   Dizziness.   Dark urine.   Decreasing urine frequency and amount.   Confusion.   Rapid breathing or pulse.  SEEK IMMEDIATE MEDICAL CARE IF:    You have blood or brown flecks (like coffee grounds) in your vomit.   You have black or bloody stools.   You have a severe headache or stiff neck.   You are confused.   You have severe abdominal pain.   You have chest pain or trouble breathing.   You do not urinate at least once every 8 hours.   You develop cold or clammy skin.   You continue to vomit for longer than 24 to 48 hours.   You have a fever.  MAKE SURE YOU:    Understand these instructions.   Will watch your condition.   Will get help right away if you are not doing well or get worse.  Document Released: 08/23/2005 Document Revised: 11/15/2011 Document Reviewed: 01/20/2011  [employer] Patient Information 2014 [employer]

## 2013-08-09 NOTE — Telephone Encounter (Signed)
Notified pt's husband that if her symptoms do not improve she will need to return to the MD.  He understood and agreed.

## 2013-08-09 NOTE — ED Notes (Signed)
Pt actively vomiting, very small amount.

## 2013-08-09 NOTE — Telephone Encounter (Addendum)
Pt is s/p lap cholecystectomy on 07/20/13 by Dr. Magnus Ivan.  She presented to the ED earlier today with N/V and abdominal pain.  Labs unremarkable.  Pt's husband is calling from home stating she is still vomiting and abdominal pain and tenderness has not improved. Pt is unable to keep even sips of water down. No fever or chills, urine/BM's normal.  No diarrhea.  Advised husband I would contact Dr. Magnus Ivan for advice and call him back asap.

## 2013-08-10 ENCOUNTER — Telehealth: Payer: Self-pay

## 2013-08-10 ENCOUNTER — Ambulatory Visit (INDEPENDENT_AMBULATORY_CARE_PROVIDER_SITE_OTHER): Payer: Federal, State, Local not specified - PPO | Admitting: Neurology

## 2013-08-10 ENCOUNTER — Encounter (INDEPENDENT_AMBULATORY_CARE_PROVIDER_SITE_OTHER): Payer: Self-pay

## 2013-08-10 ENCOUNTER — Encounter: Payer: Self-pay | Admitting: Neurology

## 2013-08-10 VITALS — BP 114/76 | HR 122 | Ht 62.0 in | Wt 166.0 lb

## 2013-08-10 DIAGNOSIS — Z5181 Encounter for therapeutic drug level monitoring: Secondary | ICD-10-CM

## 2013-08-10 DIAGNOSIS — G47411 Narcolepsy with cataplexy: Secondary | ICD-10-CM

## 2013-08-10 MED ORDER — AMPHETAMINE-DEXTROAMPHETAMINE 10 MG PO TABS: 10.0000 mg | ORAL_TABLET | Freq: Every day | ORAL | Status: DC

## 2013-08-10 MED ORDER — HYDROCODONE-ACETAMINOPHEN 5-325 MG PO TABS: 1.0000 | ORAL_TABLET | Freq: Four times a day (QID) | ORAL | Status: DC | PRN

## 2013-08-10 NOTE — Patient Instructions (Signed)
Narcolepsy Narcolepsy is a disabling neurological disorder of sleep regulation. It affects the control of sleep. It also affects the control of wakefulness. It is an interruption of the dreaming state of sleep. This state is known as REM or rapid eye movement sleep.  SYMPTOMS  The development, number, and severity of symptoms vary widely among people with the disorder. Symptoms generally begin between the ages of 15 and 30. The four classic symptoms of the disorder are:   Excessive daytime sleepiness.  Cataplexy. This is sudden, brief episodes of muscle weakness or paralysis. It is caused by strong emotions. Common strong emotions are laughter, anger, surprise, or anticipation.  Sleep paralysis. This is paralysis upon falling asleep or waking up.  Hallucinations. These are vivid dream-like images that occur at when you first fall asleep. Other symptoms include:   Unrelenting excessive sleepiness. This is usually the first and most obvious symptom.  Sleep attacks. Patients have strong sleep attacks throughout the day. These attacks can last for 30 seconds to more than 30 minutes. These happen no matter how much or how well the person slept the night before. These attacks end up making the person sleep at work and social events. The person can fall asleep while eating, talking, and driving. They also fall asleep at other out of place times.  Disturbed nighttime sleep.  Tossing and turning in bed.  Leg jerks.  Nightmares.  Waking up often. DIAGNOSIS  It's possible that genetics play a role in this disorder. Narcolepsy is not a rare disorder. It is often misdiagnosed. It is often diagnosed years after symptoms first appear. Early diagnosis and treatment are important. This help the physical and mental well-being of the patient. TREATMENT  There is no cure for narcolepsy. The symptoms can be controlled with behavioral and medical therapy. The excessive daytime sleepiness may be treated with  stimulant drugs. It may also be treated with the drug modafinil (Provigil). Cataplexy and other REM-sleep symptoms may be treated with antidepressant medications. Medications will reduce the symptoms. Medications will not ease symptoms entirely. Many available medications have side effects. Basic lifestyle changes may also reduce the symptoms. These changes include having regular sleep schedules and scheduled daytime naps. Other lifestyle changes include avoiding "over-stimulating" situations. Document Released: 08/13/2002 Document Revised: 11/15/2011 Document Reviewed: 08/23/2005 [employer] Patient Information 2014 [employer].  

## 2013-08-10 NOTE — Progress Notes (Signed)
[facility]  Provider:  Melvyn Padilla, M D  Referring Provider: Lupe Carney, MD Primary Care Physician:  Lupe Carney, MD  Chief Complaint  Patient presents with  . Narcolepsy    6 mo f/u    HPI:  [name] is a 42 y.o. female  Is seen here as a  revisit  from Dr. Clovis Riley for narcolepsy.  Interval history : [name] just underwent a gallbladder surgery under Dr Rayburn Ma after losing 20 pounds with constant nausea, indigestion, abdominal cramping.  Courtney. Melvyn Padilla is here for her regular 6 month revisit based on her Xyrem therapy she has responded very well she is much less excessive daytime sleepiness she has much less frequent narcoleptics and cataplectic attacks. As I have described in my past visit note Courtney T. short definite hypersomnia but was lacking the REM onset naps but would characterize as undoubtedly as narcolepsy. 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 Her response to Xyrem also was extremely preferable. After her last visit I had wanted the patient to be tested by the HDL a narcolepsy factor but I was was closed I will have her do this today. I will refill her Xyrem she takes 9 g daily divided into doses at night. I will not  recheck her liver function tests.  She was just discharged from the hospital; ctually the test I needed were in her last week's hospital records. I would like to at that Courtney. Melvyn Padilla had a very unfavorable experience at her last visit  wednesday prior to Thanksgiving, when she was rather broadly advised by my office staff but on my afternoon patient's appointments were cancelled. She was told that I had come to my appointment to be with my family at Thanksgiving, which was not the reason.  I understand very well why she was upset about the rather legere way this rescheduling was presented to her. I am profoundly sorry.   .  RV in 6 month with me for XYREM check up- Epworth and FSS,  weight , and LFT.   Review of Systems: Out of a complete 14 system review, the patient complains of only the following symptoms, and all other reviewed systems are negative. Again the patient is an unusual situation just having undergone abdominal surgery:  today is fatigue severity score was elevated in comparison to last 53 points. Epworth sleepiness score is endorsed at 13 points. On today's intake sheet the patient she has a recent change in her level of activity and her appetite, she has chills feels fatigued she had a weight change related to the abdominal problems with cholecystitis, excessive nighttime sweating, neck pain it a pain trouble swallowing, light sensitivity, shortness of breath, cold intolerance, swollen doorman abdominal pain abdominal cramping bleeding at the rectal nausea and vomiting joint pain joint swelling back pain muscle aches and cramps bladder incontinence environmental allergies, headaches, dizziness, weakness, concentration difficulties feeling depressed or anxious, the patient also endorsed sleep related to complaints of restless legs insomnia apnea frequent waking daytime sleepiness.  Past history :  The patient had originally had a polysomnography at [facility] , interpreted by Dr. Epimenio Foot.  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  The patient still had a high degree of sleepiness, endorsed the  Epworth score at 20 points , her BMI was 40.2 . Dr Epimenio Foot commended an MSLT  to follow.   Dr. Vassie Loll had seen her and treated her with Dexilant in 2012, the patient was than referred to our sleep lab for a reevaluation of the diagnosis of presumed narcolepsy.  At the time the patient presented with an Epworth sleepiness score of 20 points. Her PSG showed an AHI of 5 and she was titrated to 6 cm water CPAP-  however  Just 90 days after her CPAP titration  and under CPAP  compliance, her  Epworth score remained at 22 points, severely elevated.  She denied feeling any benefit from the CPAP while she was in was compliance ( documented  the downloads ) she reached up to 6 hours nightly sleep on CPAP  , but continued to take several naps each day.  She reports an ongoing  irresistible urge to sleep all day - she fell asleep during TV chills during conversations. Patient goes to bed at 11 PM and she rises about 5:30 AM to get her kids ready for school. She has 3 young children, she has not had a shift work history. The patient states that her sleep time of approximately 6 hours at night is not refreshing her, she is sometimes has very vivid dreams , she does not have nocturia Her husband had told her that she snores as well as her children and the patient has mild retrognathia. She felt that she never gets enough sleep.   She reported having sleep hallucinations and dream inclusions as well as sleep paralysis.   The patient repeated a polysomnography on CPAP , the  AHI was 1.1 her RDI 4.2 and she had minor PLM related arousals of 1.9 per hour. And MSLT followed and the patient had a mean sleep latency of only 3.6 minutes, no REM sleep onsets were noted.  This degree of hypersomnia was definitely pathologic and in the proper clinical scenario consistent with the diagnosis of narcolepsy.  She also reported cataplectic events- and was started on XYREM.   In May 2013 her Epworth score was still 21 in spite of being on 2 doses of Xyrem nightly . It turned out that the patient had not reached the 4.5 g recommended doses by July of the same year.  She presented with some weight loss and still had the Epworth score endorsed at 20 points , showed the residual AHI of 2.1,  the RDI of 3.0 . She definitely was not sleepy due to to apnea or upper airway resistancy syndrome. By November 2013 was placed on Adderall in addition to the Xyrem, the dose of Xyrem gradually increased - her  Epworth sleepiness score was still  16 still sub-optimal.  PS :  Mirapex was added for RLS that she felt hindered her sleep initiation, but  she felt no improvement, Dr. Craige Cotta at [facility] , her regular Neurologist treats her for that , and he added Neurontin which she could not tolerate. The Craige Cotta also treat the patient's migraines with Botox -injections.  She is currently using Xyrem at 6 g at the beginning of the night and 3 g for the second half of the night. She feels this has given her overall  the best sleep.  She has significantly less frequent cataplectic attacks on Xyrem.        History   Social History  . Marital Status: Married    Spouse Name: N/A    Number of Children: 3  . Years of Education: BS   Occupational History  . unemployed   .  Social History Main Topics  . Smoking status: Never Smoker   . Smokeless tobacco: Never Used  . Alcohol Use: No  . Drug Use: No  . Sexual Activity: Not on file   Other Topics Concern  . Not on file   Social History Narrative    This patient has an RDI of 12 and AHI of 4.8,  titrated to 6 cm water after PSG, she did qualify for MSLT, Epworth 22 today on 6 cm water.      Based on her MSLT  of 3.6 minutes without SREM and Epworth of 22 on CPAP with residual AHI of 1.8, she should be treated  in a trial base for narcolepsy. She is on many REM supressant medications, and may not be able to  express the narcolepsy in an MSLT.   I would like for the patient to undergo an HLA antibody test to look further for the classification of her narcolepsy disorder.  Family History  Problem Relation Age of Onset  . Cancer Mother     breast  . Cancer Father     prostate,colon  . Hypertension Father   . Thyroid disease Father   . High Cholesterol Father   . Thyroid disease Sister   . Cancer Paternal Aunt     colon  . Cancer Maternal Grandmother     kidney  . Cancer Paternal Grandmother     colon  . Cancer Paternal Aunt      lung  . Narcolepsy Neg Hx     Past Medical History  Diagnosis Date  . Hypertension   . Overactive bladder   . Esophageal reflux   . Migraine   . Psychosis   . Schizophrenia, schizo-affective   . OCD (obsessive compulsive disorder)   . Bipolar 1 disorder   . Osteoarthritis   . Restless leg syndrome   . S/P lumbar fusion 2011  . Torn rotator cuff it:2012,rt:2013  . Migraines   . Depression     excessive daytime sleepiness-MSLT confirmed pathological degree of hypersomnia-12/13  . Anxiety   . Narcolepsy   . Migraine   . Hypersomnia   . Complication of anesthesia     reports gets cold and itch  . Sleep apnea     uses CPAP    Past Surgical History  Procedure Laterality Date  . Vesicovaginal fistula closure w/ tah  2001  . Tubal ligation  2004  . Shoulder surgery      left, bone spurs, torn muscle  . Abdominal hysterectomy  2001  . Spurs,ruptured disc bone  2009  . Lumbar fusion  2011  . Rotator cuff repair  2012,2013    torn   . Oophorectomy    . Ankle fusion    . Cholecystectomy N/A 07/20/2013    Procedure: LAPAROSCOPIC CHOLECYSTECTOMY;  Surgeon: Shelly Rubenstein, MD;  Location: [facility] OR;  Service: General;  Laterality: N/A;    Current Outpatient Prescriptions  Medication Sig Dispense Refill  . AMITIZA 24 MCG capsule Take 24 mcg by mouth daily with breakfast.       . amphetamine-dextroamphetamine (ADDERALL) 10 MG tablet Take 1 tablet (10 mg total) by mouth daily.  30 tablet  0  . budesonide-formoterol (SYMBICORT) 160-4.5 MCG/ACT inhaler Inhale 2 puffs into the lungs 2 (two) times daily.        . celecoxib (CELEBREX) 200 MG capsule Take 200 mg by mouth daily.       . clobetasol (TEMOVATE) 0.05 % cream Apply 1  application topically 2 (two) times daily.       . Clobetasol Propionate (CLOBEX) 0.05 % shampoo Apply 1 application topically daily as needed (for itching).       . clotrimazole-betamethasone (LOTRISONE) lotion Apply 1 application topically daily as needed (for  rash).       . cycloSPORINE (RESTASIS) 0.05 % ophthalmic emulsion Place 1 drop into both eyes every 12 (twelve) hours.        Marland Kitchen dicyclomine (BENTYL) 20 MG tablet Take 1 tablet (20 mg total) by mouth every 6 (six) hours as needed for spasms (for abdominal cramping).  20 tablet  0  . eletriptan (RELPAX) 40 MG tablet Take 40 mg by mouth every 2 (two) hours as needed for migraine.       . fentaNYL (DURAGESIC - DOSED MCG/HR) 75 MCG/HR Place 75 mcg onto the skin every other day.      . fluocinolone (DERMA-SMOOTHE/FS BODY) 0.01 % external oil Apply 1 application topically 3 (three) times daily.       . fluocinonide (LIDEX) 0.05 % cream Apply 1 application topically 3 (three) times a week.       . gabapentin (NEURONTIN) 300 MG capsule Take 300 mg by mouth 3 (three) times daily.       . hydrochlorothiazide (HYDRODIURIL) 25 MG tablet Take 25 mg by mouth daily.        Marland Kitchen HYDROcodone-acetaminophen (VICODIN) 5-500 MG per tablet Take 1 tablet by mouth every 6 (six) hours as needed for pain.  15 tablet  0  . HYPERCARE 20 % external solution Apply 1 application topically daily as needed (to affected area).       . Loteprednol Etabonate (LOTEMAX) 0.5 % OINT Apply 1 application to eye 2 (two) times daily.       Marland Kitchen lurasidone (LATUDA) 40 MG TABS Take 40 mg by mouth at bedtime.       . methocarbamol (ROBAXIN) 500 MG tablet Take 500 mg by mouth 3 (three) times daily.        . mirabegron ER (MYRBETRIQ) 50 MG TB24 tablet Take 50 mg by mouth daily.      . NON FORMULARY Apply 1 application topically 2 (two) times daily. 2 %Baclofen-2%Cyclobenzaprine-3%Diclofenac-2%Gabapentin-6%tetracaine ointment-use as directed      . Olopatadine HCl (PATADAY) 0.2 % SOLN Apply 1 drop to eye daily as needed. As needed for allergy irritation.      . OnabotulinumtoxinA (BOTOX IJ) Inject as directed every 3 (three) months.      . ondansetron (ZOFRAN ODT) 8 MG disintegrating tablet Take 1 tablet (8 mg total) by mouth every 8 (eight) hours as  needed for nausea or vomiting.  20 tablet  0  . pantoprazole (PROTONIX) 20 MG tablet Take 20 mg by mouth daily.      . polyethylene glycol (MIRALAX / GLYCOLAX) packet Take 17 g by mouth daily as needed for moderate constipation.       . pramipexole (MIRAPEX) 0.5 MG tablet Take 0.5 mg by mouth at bedtime.       . Sodium Oxybate 500 MG/ML SOLN Take 9 mLs by mouth at bedtime.      . topiramate (TOPAMAX) 100 MG tablet Take 100-200 mg by mouth 2 (two) times daily. 1 tab every AM and 2 at night      . triamcinolone cream (KENALOG) 0.1 % Apply 1 application topically daily as needed (to affected area).       . verapamil (VERELAN PM) 240 MG 24 hr  capsule Take 240 mg by mouth at bedtime.         No current facility-administered medications for this visit.    Allergies as of 08/10/2013 - Review Complete 08/10/2013  Allergen Reaction Noted  . Dust mite extract  01/18/2013  . Mold extract [trichophyton]  01/18/2013  . Pollen extract  01/18/2013  . Adhesive [tape] Rash 08/09/2013  . Aspirin Nausea Only 06/03/2011  . Benadryl [diphenhydramine hcl] Other (See Comments) 06/09/2011  . Codeine Nausea And Vomiting 10/11/2011  . Prednisone Other (See Comments) 06/09/2011    Vitals: BP 114/76  Pulse 122  Ht 5\' 2"  (1.575 m)  Wt 166 lb (75.297 kg)  BMI 30.35 kg/m2 Last Weight:  Wt Readings from Last 1 Encounters:  08/10/13 166 lb (75.297 kg)   Last Height:   Ht Readings from Last 1 Encounters:  08/10/13 5\' 2"  (1.575 m)    Physical exam:  General: The patient is awake, alert and appears not in acute distress. The patient is well groomed. Head: Normocephalic, atraumatic. Neck is supple. Mallampati 2  neck circumference:15.5 inches , lost 12 pounds since Dec. 2013  Cardiovascular:  Regular rate and rhythm, without  murmurs or carotid bruit, and without distended neck veins. Respiratory: Lungs are clear to auscultation. Skin:  Without evidence of edema, or rash Trunk: BMI is still  elevated and  patient  has normal posture.  Neurologic exam : The patient is awake and alert, oriented to place and time.  Memory subjective  described as intact. There is a normal attention span & concentration ability. Speech is fluent without  dysarthria, dysphonia or aphasia. Mood and affect are appropriate.  Cranial nerves: Pupils are equal and briskly reactive to light. Funduscopic exam without  evidence of pallor or edema. Extraocular movements  in vertical and horizontal planes intact and without nystagmus. Visual fields by finger perimetry are intact. Hearing to finger rub intact.  Facial sensation intact to fine touch. Facial motor strength is symmetric and tongue and uvula move midline.  Motor exam:  Normal tone and normal muscle bulk and symmetric normal strength in all extremities.  Sensory:  Fine touch, pinprick and vibration were tested in all extremities. Proprioception is tested in the upper extremities only. This was  normal.  Coordination: Rapid alternating movements in the fingers/hands is tested and normal. Finger-to-nose maneuver tested and normal without evidence of ataxia, dysmetria or tremor.  Gait and station: Patient walks without assistive device and is able and assisted stool climb up to the exam table. Strength within normal limits. Stance is stable and normal.  Romberg testing is negative .  Deep tendon reflexes: in the  upper and lower extremities are symmetric and intact. Babinski maneuver response is downgoing.   Assessment:  After physical and neurologic examination, review of laboratory studies, imaging, neurophysiology testing and pre-existing records, assessment will be reviewed on the problem list.   Patient will continue on Xyrem as well as on her CPAP machine.  I am optimistic that further weight loss may eliminate the need for CPAP in the near future. She has lost 20 pounds , she will need to stay at this weight for the next 6 month to make an adjustment in  pressure.  Xyrem will be refilled today.  I will obtain liver function tests.  I will share these with her referring physician Dr. Andee Poles.  Plan:  Treatment plan and discussion for narcolepsy - HLA test ordered in office . RN to draw.   Adderall refilled ,  XYREM  Refilled, but will need a pre-authorization - forwarded request to my pharmacy tech.

## 2013-08-10 NOTE — ED Provider Notes (Signed)
The patient says reported to the emergency department with a prescription for Vicodin 5/500. Patient was unable to get this prescription filled because 5/500 were not available. I reviewed the documentation from the note from 08/07/2013. I confirmed that Vicodin was written for by Dr. Norlene Campbell.  Short course of Vicodin 5/325 was written for dispense 6 tablets. Patient does have an allergy to codeine however she can reportedly take Vicodin without issue.  Darlys Gales, MD 08/10/13 [phone]

## 2013-08-10 NOTE — Telephone Encounter (Signed)
Left VM for patient. We have an Rx here that can be picked up Monday morning.

## 2013-08-16 ENCOUNTER — Ambulatory Visit: Payer: Federal, State, Local not specified - PPO | Admitting: Nurse Practitioner

## 2013-08-17 ENCOUNTER — Telehealth: Payer: Self-pay

## 2013-09-05 ENCOUNTER — Encounter (HOSPITAL_COMMUNITY): Payer: Self-pay | Admitting: Emergency Medicine

## 2013-09-05 ENCOUNTER — Emergency Department (HOSPITAL_COMMUNITY)
Admission: EM | Admit: 2013-09-05 | Discharge: 2013-09-05 | Disposition: A | Discharge status: Home / Self Care | Payer: Federal, State, Local not specified - PPO | Attending: Emergency Medicine | Admitting: Emergency Medicine

## 2013-09-05 DIAGNOSIS — K219 Gastro-esophageal reflux disease without esophagitis: Secondary | ICD-10-CM | POA: Insufficient documentation

## 2013-09-05 DIAGNOSIS — Z9089 Acquired absence of other organs: Secondary | ICD-10-CM | POA: Insufficient documentation

## 2013-09-05 DIAGNOSIS — F319 Bipolar disorder, unspecified: Secondary | ICD-10-CM | POA: Insufficient documentation

## 2013-09-05 DIAGNOSIS — F411 Generalized anxiety disorder: Secondary | ICD-10-CM | POA: Insufficient documentation

## 2013-09-05 DIAGNOSIS — Z87828 Personal history of other (healed) physical injury and trauma: Secondary | ICD-10-CM | POA: Insufficient documentation

## 2013-09-05 DIAGNOSIS — G43909 Migraine, unspecified, not intractable, without status migrainosus: Secondary | ICD-10-CM | POA: Insufficient documentation

## 2013-09-05 DIAGNOSIS — Z981 Arthrodesis status: Secondary | ICD-10-CM | POA: Insufficient documentation

## 2013-09-05 DIAGNOSIS — G47419 Narcolepsy without cataplexy: Secondary | ICD-10-CM | POA: Insufficient documentation

## 2013-09-05 DIAGNOSIS — R197 Diarrhea, unspecified: Secondary | ICD-10-CM | POA: Insufficient documentation

## 2013-09-05 DIAGNOSIS — Z90710 Acquired absence of both cervix and uterus: Secondary | ICD-10-CM | POA: Insufficient documentation

## 2013-09-05 DIAGNOSIS — G4730 Sleep apnea, unspecified: Secondary | ICD-10-CM | POA: Insufficient documentation

## 2013-09-05 DIAGNOSIS — Z79899 Other long term (current) drug therapy: Secondary | ICD-10-CM | POA: Insufficient documentation

## 2013-09-05 DIAGNOSIS — F259 Schizoaffective disorder, unspecified: Secondary | ICD-10-CM | POA: Insufficient documentation

## 2013-09-05 DIAGNOSIS — F429 Obsessive-compulsive disorder, unspecified: Secondary | ICD-10-CM | POA: Insufficient documentation

## 2013-09-05 DIAGNOSIS — Z9079 Acquired absence of other genital organ(s): Secondary | ICD-10-CM | POA: Insufficient documentation

## 2013-09-05 DIAGNOSIS — Z87448 Personal history of other diseases of urinary system: Secondary | ICD-10-CM | POA: Insufficient documentation

## 2013-09-05 DIAGNOSIS — G2581 Restless legs syndrome: Secondary | ICD-10-CM | POA: Insufficient documentation

## 2013-09-05 DIAGNOSIS — R1084 Generalized abdominal pain: Secondary | ICD-10-CM | POA: Insufficient documentation

## 2013-09-05 DIAGNOSIS — M1990 Unspecified osteoarthritis, unspecified site: Secondary | ICD-10-CM | POA: Insufficient documentation

## 2013-09-05 DIAGNOSIS — IMO0002 Reserved for concepts with insufficient information to code with codable children: Secondary | ICD-10-CM | POA: Insufficient documentation

## 2013-09-05 DIAGNOSIS — R1110 Vomiting, unspecified: Secondary | ICD-10-CM

## 2013-09-05 DIAGNOSIS — Z9851 Tubal ligation status: Secondary | ICD-10-CM | POA: Insufficient documentation

## 2013-09-05 DIAGNOSIS — Z9981 Dependence on supplemental oxygen: Secondary | ICD-10-CM | POA: Insufficient documentation

## 2013-09-05 DIAGNOSIS — I10 Essential (primary) hypertension: Secondary | ICD-10-CM | POA: Insufficient documentation

## 2013-09-05 DIAGNOSIS — R6883 Chills (without fever): Secondary | ICD-10-CM | POA: Insufficient documentation

## 2013-09-05 LAB — POCT I-STAT, CHEM 8
BUN: <3 mg/dL — ABNORMAL LOW (ref 6–23)
Calcium, Ion: 1.17 mmol/L (ref 1.12–1.23)
HCT: 39 % (ref 36.0–46.0)
Hemoglobin: 13.3 g/dL (ref 12.0–15.0)
Sodium: 140 mEq/L (ref 137–147)
TCO2: 27 mmol/L (ref 0–100)

## 2013-09-05 MED ORDER — MORPHINE SULFATE 4 MG/ML IJ SOLN
4.0000 mg | Freq: Once | INTRAMUSCULAR | Status: AC
Start: 2013-09-05 — End: 2013-09-05
  Administered 2013-09-05: 4 mg via INTRAVENOUS
  Filled 2013-09-05: qty 1

## 2013-09-05 MED ORDER — PROMETHAZINE HCL 25 MG RE SUPP: 25.0000 mg | Freq: Four times a day (QID) | RECTAL | Status: DC | PRN

## 2013-09-05 MED ORDER — SODIUM CHLORIDE 0.9 % IV SOLN
Freq: Once | INTRAVENOUS | Status: AC
  Administered 2013-09-05: 06:00:00 via INTRAVENOUS

## 2013-09-05 MED ORDER — ONDANSETRON HCL 4 MG/2ML IJ SOLN
4.0000 mg | Freq: Once | INTRAMUSCULAR | Status: AC
  Administered 2013-09-05: 4 mg via INTRAVENOUS
  Filled 2013-09-05: qty 2

## 2013-09-05 MED ORDER — SODIUM CHLORIDE 0.9 % IV BOLUS (SEPSIS)
1000.0000 mL | Freq: Once | INTRAVENOUS | Status: AC
  Administered 2013-09-05: 1000 mL via INTRAVENOUS

## 2013-09-05 MED ORDER — ONDANSETRON 4 MG PO TBDP
8.0000 mg | ORAL_TABLET | Freq: Once | ORAL | Status: AC
  Administered 2013-09-05: 8 mg via ORAL
  Filled 2013-09-05: qty 2

## 2013-09-05 MED ORDER — MORPHINE SULFATE 2 MG/ML IJ SOLN
2.0000 mg | Freq: Once | INTRAMUSCULAR | Status: AC
  Administered 2013-09-05: 2 mg via INTRAVENOUS
  Filled 2013-09-05: qty 1

## 2013-09-05 NOTE — ED Notes (Signed)
Pt states "I cannot tolerate solid foods, I only seem to tolerate liquids.  I had my gallbladder removed I am having the diarrhea and vomiting."

## 2013-09-05 NOTE — ED Notes (Signed)
Given PO fluid

## 2013-09-05 NOTE — ED Notes (Signed)
Dr. Bebe Shaggy at bedside performed occult sample.

## 2013-09-05 NOTE — ED Notes (Signed)
Pt. reports nausea , vomitting , diarrhea and mid abdominal cramping onset this evening with chills.

## 2013-09-05 NOTE — ED Provider Notes (Signed)
CSN: [id number]     Arrival date & time 09/05/13  0112 History   First MD Initiated Contact with Patient 09/05/13 [phone]     Chief Complaint  Patient presents with  . Emesis  . Diarrhea  . Chills    Patient is a 42 y.o. female presenting with vomiting and diarrhea. The history is provided by the patient.  Emesis Severity:  Moderate Timing:  Intermittent Progression:  Worsening Chronicity:  Recurrent Relieved by:  Nothing Worsened by:  Nothing tried Associated symptoms: abdominal pain, chills and diarrhea   Associated symptoms: no fever   Diarrhea Associated symptoms: abdominal pain, chills and vomiting   pt reports recurrence of abd pain, vomiting and diarrhea that started last night No fever No recent travel This is similar to prior episodes   Past Medical History  Diagnosis Date  . Hypertension   . Overactive bladder   . Esophageal reflux   . Migraine   . Psychosis   . Schizophrenia, schizo-affective   . OCD (obsessive compulsive disorder)   . Bipolar 1 disorder   . Osteoarthritis   . Restless leg syndrome   . S/P lumbar fusion 2011  . Torn rotator cuff it:2012,rt:2013  . Migraines   . Depression     excessive daytime sleepiness-MSLT confirmed pathological degree of hypersomnia-12/13  . Anxiety   . Narcolepsy   . Migraine   . Hypersomnia   . Complication of anesthesia     reports gets cold and itch  . Sleep apnea     uses CPAP   Past Surgical History  Procedure Laterality Date  . Vesicovaginal fistula closure w/ tah  2001  . Tubal ligation  2004  . Shoulder surgery      left, bone spurs, torn muscle  . Abdominal hysterectomy  2001  . Spurs,ruptured disc bone  2009  . Lumbar fusion  2011  . Rotator cuff repair  2012,2013    torn   . Oophorectomy    . Ankle fusion    . Cholecystectomy N/A 07/20/2013    Procedure: LAPAROSCOPIC CHOLECYSTECTOMY;  Surgeon: Shelly Rubenstein, MD;  Location: [facility] OR;  Service: General;  Laterality: N/A;   Family History   Problem Relation Age of Onset  . Cancer Mother     breast  . Cancer Father     prostate,colon  . Hypertension Father   . Thyroid disease Father   . High Cholesterol Father   . Thyroid disease Sister   . Cancer Paternal Aunt     colon  . Cancer Maternal Grandmother     kidney  . Cancer Paternal Grandmother     colon  . Cancer Paternal Aunt     lung  . Narcolepsy Neg Hx    History  Substance Use Topics  . Smoking status: Never Smoker   . Smokeless tobacco: Never Used  . Alcohol Use: No   OB History   Grav Para Term Preterm Abortions TAB SAB Ect Mult Living                 Review of Systems  Constitutional: Positive for chills.  Gastrointestinal: Positive for vomiting, abdominal pain and diarrhea.  All other systems reviewed and are negative.    Allergies  Dust mite extract; Mold extract; Pollen extract; Adhesive; Aspirin; Benadryl; Codeine; and Prednisone  Home Medications   Current Outpatient Rx  Name  Route  Sig  Dispense  Refill  . AMITIZA 24 MCG capsule   Oral   Take  24 mcg by mouth daily with breakfast.          . amphetamine-dextroamphetamine (ADDERALL) 10 MG tablet   Oral   Take 1 tablet (10 mg total) by mouth daily. Prn use for breakthrough sleepiness, mostly used mid day po. 10-20 mg prn.   60 tablet   0   . budesonide-formoterol (SYMBICORT) 160-4.5 MCG/ACT inhaler   Inhalation   Inhale 2 puffs into the lungs 2 (two) times daily.           . celecoxib (CELEBREX) 200 MG capsule   Oral   Take 200 mg by mouth daily.          . clobetasol (TEMOVATE) 0.05 % cream   Topical   Apply 1 application topically 2 (two) times daily.          . Clobetasol Propionate (CLOBEX) 0.05 % shampoo   Topical   Apply 1 application topically daily as needed (for itching).          . clotrimazole-betamethasone (LOTRISONE) lotion   Topical   Apply 1 application topically daily as needed (for rash).          . cycloSPORINE (RESTASIS) 0.05 %  ophthalmic emulsion   Both Eyes   Place 1 drop into both eyes every 12 (twelve) hours.           Marland Kitchen dicyclomine (BENTYL) 20 MG tablet   Oral   Take 1 tablet (20 mg total) by mouth every 6 (six) hours as needed for spasms (for abdominal cramping).   20 tablet   0   . eletriptan (RELPAX) 40 MG tablet   Oral   Take 40 mg by mouth every 2 (two) hours as needed for migraine.          . fentaNYL (DURAGESIC - DOSED MCG/HR) 75 MCG/HR   Transdermal   Place 75 mcg onto the skin every other day.         . fluocinolone (DERMA-SMOOTHE/FS BODY) 0.01 % external oil   Topical   Apply 1 application topically 3 (three) times daily.          . fluocinonide (LIDEX) 0.05 % cream   Topical   Apply 1 application topically 3 (three) times a week.          . gabapentin (NEURONTIN) 300 MG capsule   Oral   Take 300 mg by mouth 3 (three) times daily.          . hydrochlorothiazide (HYDRODIURIL) 25 MG tablet   Oral   Take 25 mg by mouth daily.           Marland Kitchen HYDROcodone-acetaminophen (NORCO/VICODIN) 5-325 MG per tablet   Oral   Take 1 tablet by mouth every 6 (six) hours as needed.   6 tablet   0   . HYDROcodone-acetaminophen (VICODIN) 5-500 MG per tablet   Oral   Take 1 tablet by mouth every 6 (six) hours as needed for pain.   15 tablet   0   . HYPERCARE 20 % external solution   Topical   Apply 1 application topically daily as needed (to affected area).          . Loteprednol Etabonate (LOTEMAX) 0.5 % OINT   Ophthalmic   Apply 1 application to eye 2 (two) times daily.          Marland Kitchen lurasidone (LATUDA) 40 MG TABS   Oral   Take 40 mg by mouth at bedtime.          Marland Kitchen  methocarbamol (ROBAXIN) 500 MG tablet   Oral   Take 500 mg by mouth 3 (three) times daily.           . mirabegron ER (MYRBETRIQ) 50 MG TB24 tablet   Oral   Take 50 mg by mouth daily.         . NON FORMULARY   Topical   Apply 1 application topically 2 (two) times daily. 2  %Baclofen-2%Cyclobenzaprine-3%Diclofenac-2%Gabapentin-6%tetracaine ointment-use as directed         . Olopatadine HCl (PATADAY) 0.2 % SOLN   Ophthalmic   Apply 1 drop to eye daily as needed. As needed for allergy irritation.         . OnabotulinumtoxinA (BOTOX IJ)   Injection   Inject as directed every 3 (three) months.         . ondansetron (ZOFRAN ODT) 8 MG disintegrating tablet   Oral   Take 1 tablet (8 mg total) by mouth every 8 (eight) hours as needed for nausea or vomiting.   20 tablet   0   . pantoprazole (PROTONIX) 20 MG tablet   Oral   Take 20 mg by mouth daily.         . polyethylene glycol (MIRALAX / GLYCOLAX) packet   Oral   Take 17 g by mouth daily as needed for moderate constipation.          . pramipexole (MIRAPEX) 0.5 MG tablet   Oral   Take 0.5 mg by mouth at bedtime.          . Sodium Oxybate 500 MG/ML SOLN   Oral   Take 9 mLs by mouth at bedtime.         . topiramate (TOPAMAX) 100 MG tablet   Oral   Take 100-200 mg by mouth 2 (two) times daily. 1 tab every AM and 2 at night         . triamcinolone cream (KENALOG) 0.1 %   Topical   Apply 1 application topically daily as needed (to affected area).          . verapamil (VERELAN PM) 240 MG 24 hr capsule   Oral   Take 240 mg by mouth at bedtime.            BP 163/98  Pulse 73  Temp(Src) 98.7 F (37.1 C) (Oral)  Resp 17  Ht 5\' 3"  (1.6 m)  Wt 172 lb (78.019 kg)  BMI 30.48 kg/m2  SpO2 93% Physical Exam CONSTITUTIONAL: Well developed/well nourished, pt actively vomiting HEAD: Normocephalic/atraumatic EYES: EOMI/PERRL ENMT: Mucous membranes moist NECK: supple no meningeal signs SPINE:entire spine nontender CV: S1/S2 noted, no murmurs/rubs/gallops noted LUNGS: Lungs are clear to auscultation bilaterally, no apparent distress ABDOMEN: soft, diffuse tenderness noted, no rebound or guarding Rectal - stool color brown, no melena or blood noted GU:no cva tenderness NEURO: Pt is  awake/alert, moves all extremitiesx4 EXTREMITIES: pulses normal, full ROM SKIN: warm, color normal PSYCH: no abnormalities of mood noted  ED Course  Procedures (including critical care time) Labs Review Labs Reviewed - No data to display Imaging Review No results found.  EKG Interpretation   None      7:05 AM Pt still with active vomiting.  Reports some abd pain but abd is soft and reports similar to prior episodes 8:22 AM Pt improved She is able to tolerate ice chips I doubt acute abd process at this time Her stool color was brown, but hemoccult positive, doubt acute GI bleed This has  been an ongoing process for her with vomiting/diarrhea and now some wt loss since her cholecystectomy.  I feel she is safe for d/c home and can f/u with GI physician Pt is agreeable with plan  MDM  No diagnosis found. Nursing notes including past medical history and social history reviewed and considered in documentation Labs/vital reviewed and considered     Joya Gaskins, MD 09/05/13 [phone]

## 2013-09-05 NOTE — ED Notes (Signed)
Patient vomited PO fluid and medication up.

## 2013-09-12 ENCOUNTER — Other Ambulatory Visit: Payer: Self-pay | Admitting: Gastroenterology

## 2013-09-13 ENCOUNTER — Telehealth: Payer: Self-pay | Admitting: Neurology

## 2013-09-13 NOTE — Telephone Encounter (Signed)
I contacted the ins to follow up on PA requests.  They have indicated both meds, Xyrem and Adderall, were approved effective 07/15/2013- 09/13/2014.  No reference number is needed per ins.  Member ID # [id number].  I called [facility] regarding Adderall.  They processed Rx and it went through without any issues.  I called the patient.  Advised both meds were approved.  She is aware and will follow up with the pharmacy.

## 2013-09-13 NOTE — Telephone Encounter (Signed)
F/U PRIOR AUTHORIZATION ON ADDERALL AND ALSO PRIOR AUTHORIZATION FOR XYREM--PLEASE CALL

## 2013-09-21 ENCOUNTER — Encounter (HOSPITAL_COMMUNITY): Payer: Self-pay | Admitting: Pharmacy Technician

## 2013-09-25 NOTE — Telephone Encounter (Signed)
error 

## 2013-09-26 ENCOUNTER — Encounter (HOSPITAL_COMMUNITY): Payer: Self-pay | Admitting: *Deleted

## 2013-10-02 ENCOUNTER — Encounter (HOSPITAL_COMMUNITY)
Admission: RE | Disposition: A | Discharge status: Home / Self Care | Payer: Self-pay | Source: Ambulatory Visit | Attending: Gastroenterology

## 2013-10-02 ENCOUNTER — Encounter (HOSPITAL_COMMUNITY): Payer: Self-pay | Admitting: *Deleted

## 2013-10-02 ENCOUNTER — Ambulatory Visit (HOSPITAL_COMMUNITY)
Admission: RE | Admit: 2013-10-02 | Discharge: 2013-10-02 | Disposition: A | Discharge status: Home / Self Care | Payer: Federal, State, Local not specified - PPO | Source: Ambulatory Visit | Attending: Gastroenterology | Admitting: Gastroenterology

## 2013-10-02 ENCOUNTER — Encounter (HOSPITAL_COMMUNITY): Payer: Federal, State, Local not specified - PPO | Admitting: Anesthesiology

## 2013-10-02 ENCOUNTER — Ambulatory Visit (HOSPITAL_COMMUNITY): Payer: Federal, State, Local not specified - PPO | Admitting: Anesthesiology

## 2013-10-02 DIAGNOSIS — K921 Melena: Secondary | ICD-10-CM | POA: Insufficient documentation

## 2013-10-02 DIAGNOSIS — K219 Gastro-esophageal reflux disease without esophagitis: Secondary | ICD-10-CM | POA: Insufficient documentation

## 2013-10-02 DIAGNOSIS — R197 Diarrhea, unspecified: Secondary | ICD-10-CM | POA: Insufficient documentation

## 2013-10-02 DIAGNOSIS — G4733 Obstructive sleep apnea (adult) (pediatric): Secondary | ICD-10-CM | POA: Insufficient documentation

## 2013-10-02 DIAGNOSIS — Z9089 Acquired absence of other organs: Secondary | ICD-10-CM | POA: Insufficient documentation

## 2013-10-02 DIAGNOSIS — R634 Abnormal weight loss: Secondary | ICD-10-CM | POA: Insufficient documentation

## 2013-10-02 DIAGNOSIS — D126 Benign neoplasm of colon, unspecified: Secondary | ICD-10-CM | POA: Insufficient documentation

## 2013-10-02 DIAGNOSIS — I10 Essential (primary) hypertension: Secondary | ICD-10-CM | POA: Insufficient documentation

## 2013-10-02 DIAGNOSIS — R112 Nausea with vomiting, unspecified: Secondary | ICD-10-CM | POA: Insufficient documentation

## 2013-10-02 HISTORY — PX: ESOPHAGOGASTRODUODENOSCOPY (EGD) WITH PROPOFOL: SHX5813

## 2013-10-02 HISTORY — PX: COLONOSCOPY WITH PROPOFOL: SHX5780

## 2013-10-02 HISTORY — DX: Unspecified asthma, uncomplicated: J45.909

## 2013-10-02 SURGERY — COLONOSCOPY WITH PROPOFOL
Anesthesia: Monitor Anesthesia Care

## 2013-10-02 MED ORDER — LACTATED RINGERS IV SOLN
INTRAVENOUS | Status: DC | PRN
  Administered 2013-10-02: 12:00:00 via INTRAVENOUS

## 2013-10-02 MED ORDER — BUTAMBEN-TETRACAINE-BENZOCAINE 2-2-14 % EX AERO
INHALATION_SPRAY | CUTANEOUS | Status: DC | PRN
  Administered 2013-10-02: 1 via TOPICAL

## 2013-10-02 MED ORDER — PROPOFOL 10 MG/ML IV BOLUS
INTRAVENOUS | Status: AC
  Filled 2013-10-02: qty 20

## 2013-10-02 MED ORDER — LIDOCAINE HCL (CARDIAC) 20 MG/ML IV SOLN
INTRAVENOUS | Status: AC
  Filled 2013-10-02: qty 5

## 2013-10-02 MED ORDER — PROPOFOL 10 MG/ML IV BOLUS
INTRAVENOUS | Status: AC
Start: 2013-10-02 — End: 2013-10-02
  Filled 2013-10-02: qty 20

## 2013-10-02 MED ORDER — SODIUM CHLORIDE 0.9 % IV SOLN: INTRAVENOUS | Status: DC

## 2013-10-02 MED ORDER — PROPOFOL INFUSION 10 MG/ML OPTIME
INTRAVENOUS | Status: DC | PRN
  Administered 2013-10-02: 160 ug/kg/min via INTRAVENOUS

## 2013-10-02 MED ORDER — LACTATED RINGERS IV SOLN: INTRAVENOUS | Status: DC

## 2013-10-02 SURGICAL SUPPLY — 24 items

## 2013-10-02 NOTE — Op Note (Signed)
Problems: Chronic postprandial nausea, vomiting, diarrhea, hematochezia, unintentional weight loss  Endoscopist: Earle Gell  Premedication: Propofol administered by anesthesia  Procedure: Diagnostic esophagogastroduodenoscopy with small bowel biopsies and gastric biopsies The patient was placed in the left lateral decubitus position. The Pentax gastroscope was passed through the posterior hypopharynx into the proximal esophagus without difficulty. The hypopharynx, larynx, and vocal cords appeared normal.  Esophagoscopy: The proximal, mid, and lower segments of the esophageal mucosa appeared normal. The squamocolumnar junction was regular and noted at 37 cm from the incisor teeth.  Gastroscopy: Retroflex view of the gastric cardia and fundus was normal. The gastric body, antrum, and pylorus appeared normal. Random gastric biopsies were performed to look for H. pylori gastritis.  Duodenoscopy: The duodenal bulb and descending duodenum appeared normal. Four biopsies were taken from the second portion of the duodenum and a biopsy was taken from the duodenal bulb to look for villous atrophy associated with celiac disease.  Assessment:  #1. Normal esophagogastroduodenoscopy  #2. Random gastric biopsies performed to rule out H. pyloric gastritis.  #3. Small bowel biopsies performed to rule out villous atrophy associated with celiac disease.  Procedure: Diagnostic colonoscopy Anal inspection and digital rectal exam were normal. The Pentax pediatric colonoscope was introduced into the rectum and advanced to the cecum. A normal-appearing appendiceal orifice and ileocecal valve were identified. Colonic preparation for the exam today was good.  Rectum. Normal. Retroflexed view of the distal rectum normal  Sigmoid colon and descending colon. Normal  Splenic flexure. Normal  Transverse colon. Normal  Hepatic flexure. Normal  Ascending colon. Normal  Cecum and ileocecal valve. A 3 mm  sessile polyp was removed from the proximal cecum with the cold biopsy forceps.  Random colon biopsies. Biopsies were performed randomly from the right colon and left colon to look for microscopic colitis.  Assessment:  #1. From the cecum, a 3 mm sessile polyp was removed with the cold biopsy forceps  #2. Random colon biopsies were performed from the right colon and left colon to look for microscopic colitis  #3. Otherwise normal diagnostic proctocolonoscopy to the cecum.

## 2013-10-02 NOTE — Transfer of Care (Signed)
Immediate Anesthesia Transfer of Care Note  Patient: ZYKIA WALLA  Procedure(s) Performed: Procedure(s): COLONOSCOPY WITH PROPOFOL (N/A) ESOPHAGOGASTRODUODENOSCOPY (EGD) WITH PROPOFOL (N/A)  Patient Location: PACU  Anesthesia Type:MAC  Level of Consciousness: awake, alert  and oriented  Airway & Oxygen Therapy: Patient Spontanous Breathing and Patient connected to face mask oxygen  Post-op Assessment: Report given to PACU RN and Post -op Vital signs reviewed and stable  Post vital signs: Reviewed and stable  Complications: No apparent anesthesia complications

## 2013-10-02 NOTE — Anesthesia Preprocedure Evaluation (Signed)
Anesthesia Evaluation  Patient identified by MRN, date of birth, ID band Patient awake    Reviewed: Allergy & Precautions, H&P , NPO status , Patient's Chart, lab work & pertinent test results  History of Anesthesia Complications (+) history of anesthetic complications  Airway Mallampati: II TM Distance: >3 FB Neck ROM: Full    Dental no notable dental hx.    Pulmonary shortness of breath, asthma , sleep apnea ,  breath sounds clear to auscultation  Pulmonary exam normal       Cardiovascular hypertension, Pt. on medications Rhythm:Regular Rate:Normal     Neuro/Psych  Headaches, PSYCHIATRIC DISORDERS Anxiety Depression  Neuromuscular disease    GI/Hepatic Neg liver ROS, GERD-  Medicated,  Endo/Other  negative endocrine ROS  Renal/GU negative Renal ROS  negative genitourinary   Musculoskeletal negative musculoskeletal ROS (+)   Abdominal   Peds negative pediatric ROS (+)  Hematology negative hematology ROS (+)   Anesthesia Other Findings   Reproductive/Obstetrics negative OB ROS                           Anesthesia Physical Anesthesia Plan  ASA: II  Anesthesia Plan: MAC   Post-op Pain Management:    Induction: Intravenous  Airway Management Planned:   Additional Equipment:   Intra-op Plan:   Post-operative Plan:   Informed Consent: I have reviewed the patients History and Physical, chart, labs and discussed the procedure including the risks, benefits and alternatives for the proposed anesthesia with the patient or authorized representative who has indicated his/her understanding and acceptance.   Dental advisory given  Plan Discussed with: CRNA  Anesthesia Plan Comments:         Anesthesia Quick Evaluation

## 2013-10-02 NOTE — Anesthesia Postprocedure Evaluation (Signed)
  Anesthesia Post-op Note  Patient: [name]  Procedure(s) Performed: Procedure(s) (LRB): COLONOSCOPY WITH PROPOFOL (N/A) ESOPHAGOGASTRODUODENOSCOPY (EGD) WITH PROPOFOL (N/A)  Patient Location: PACU  Anesthesia Type: MAC  Level of Consciousness: awake and alert   Airway and Oxygen Therapy: Patient Spontanous Breathing  Post-op Pain: mild  Post-op Assessment: Post-op Vital signs reviewed, Patient's Cardiovascular Status Stable, Respiratory Function Stable, Patent Airway and No signs of Nausea or vomiting  Last Vitals:  Filed Vitals:   10/02/13 1330  BP: 168/93  Temp:   Resp: 17    Post-op Vital Signs: stable   Complications: No apparent anesthesia complications

## 2013-10-02 NOTE — H&P (Signed)
Problems: Chronic postprandial nausea, vomiting, diarrhea, hematochezia, and unintentional weight loss.  History: The patient is a 43 year old female born [birth year]. She has chronic, intermittent postprandial nausea, vomiting, diarrhea, hematochezia, and unintentional weight loss.  In July 2014, she underwent a normal CT scan of the abdomen and pelvis and normal abdominal ultrasound except for the presence of a prominent left ovary. In August 2014, her barium upper GI x-ray series was normal. In November 2014, the patient underwent a laparoscopic cholecystectomy.  She is scheduled to undergo a diagnostic esophagogastroduodenoscopy with performance of small bowel biopsies followed by a diagnostic colonoscopy to perform random colon biopsies and rule out inflammatory bowel disease.  Past medical history: Hypertension. Overactive bladder. Gastroesophageal reflux. Migraine headache syndrome. Osteoarthritis. Narcolepsy. Obstructive sleep apnea syndrome. Hysterectomy with removal of the right ovary. Back surgery. Ankle surgery. Left shoulder surgery. Laparoscopic cholecystectomy.  Allergies: Aspirin. Benadryl. Prednisone.  Exam: The patient is alert and lying comfortably on the endoscopy stretcher. Lungs are clear to auscultation. Cardiac exam reveals a regular rhythm. Abdomen is soft and nontender to palpation.  Plan: Proceed with diagnostic esophagogastroduodenoscopy with small bowel biopsies and diagnostic colonoscopy with random colon biopsies.

## 2013-10-02 NOTE — Discharge Instructions (Signed)
Colonoscopy °Care After °These instructions give you information on caring for yourself after your procedure. Your doctor may also give you more specific instructions. Call your doctor if you have any problems or questions after your procedure. °HOME CARE °· Take it easy for the next 24 hours. °· Rest. °· Walk or use warm packs on your belly (abdomen) if you have belly cramping or gas. °· Do not drive for 24 hours. °· You may shower. °· Do not sign important papers or use machinery for 24 hours. °· Drink enough fluids to keep your pee (urine) clear or pale yellow. °· Resume your normal diet. Avoid heavy or fried foods. °· Avoid alcohol. °· Continue taking your normal medicines. °· Only take medicine as told by your doctor. Do not take aspirin. °If you had growths (polyps) removed: °· Do not take aspirin. °· Do not drink alcohol for 7 days or as told by your doctor. °· Eat a soft diet for 24 hours. °GET HELP RIGHT AWAY IF: °· You have a fever. °· You pass clumps of tissue (blood clots) or fill the toilet with blood. °· You have belly pain that gets worse and medicine does not help. °· Your belly is puffy (swollen). °· You feel sick to your stomach (nauseous) or throw up (vomit). °MAKE SURE YOU: °· Understand these instructions. °· Will watch your condition. °· Will get help right away if you are not doing well or get worse. °Document Released: 09/25/2010 Document Revised: 11/15/2011 Document Reviewed: 04/30/2013 °[employer]® Patient Information ©2014 [employer]. ° °

## 2013-10-03 ENCOUNTER — Encounter (HOSPITAL_COMMUNITY): Payer: Self-pay | Admitting: Gastroenterology

## 2013-10-11 ENCOUNTER — Other Ambulatory Visit: Payer: Self-pay | Admitting: Gastroenterology

## 2013-10-11 DIAGNOSIS — R197 Diarrhea, unspecified: Secondary | ICD-10-CM

## 2013-10-11 DIAGNOSIS — R109 Unspecified abdominal pain: Secondary | ICD-10-CM

## 2013-10-11 DIAGNOSIS — R1110 Vomiting, unspecified: Secondary | ICD-10-CM

## 2013-10-12 ENCOUNTER — Emergency Department (HOSPITAL_COMMUNITY)
Admission: EM | Admit: 2013-10-12 | Discharge: 2013-10-13 | Disposition: A | Discharge status: Home / Self Care | Payer: Federal, State, Local not specified - PPO | Attending: Emergency Medicine | Admitting: Emergency Medicine

## 2013-10-12 ENCOUNTER — Encounter (HOSPITAL_COMMUNITY): Payer: Self-pay | Admitting: Emergency Medicine

## 2013-10-12 DIAGNOSIS — I10 Essential (primary) hypertension: Secondary | ICD-10-CM | POA: Insufficient documentation

## 2013-10-12 DIAGNOSIS — M1990 Unspecified osteoarthritis, unspecified site: Secondary | ICD-10-CM | POA: Insufficient documentation

## 2013-10-12 DIAGNOSIS — F319 Bipolar disorder, unspecified: Secondary | ICD-10-CM | POA: Insufficient documentation

## 2013-10-12 DIAGNOSIS — G4730 Sleep apnea, unspecified: Secondary | ICD-10-CM | POA: Insufficient documentation

## 2013-10-12 DIAGNOSIS — Z87448 Personal history of other diseases of urinary system: Secondary | ICD-10-CM | POA: Insufficient documentation

## 2013-10-12 DIAGNOSIS — IMO0002 Reserved for concepts with insufficient information to code with codable children: Secondary | ICD-10-CM | POA: Insufficient documentation

## 2013-10-12 DIAGNOSIS — Z981 Arthrodesis status: Secondary | ICD-10-CM | POA: Insufficient documentation

## 2013-10-12 DIAGNOSIS — A084 Viral intestinal infection, unspecified: Secondary | ICD-10-CM

## 2013-10-12 DIAGNOSIS — J45909 Unspecified asthma, uncomplicated: Secondary | ICD-10-CM | POA: Insufficient documentation

## 2013-10-12 DIAGNOSIS — Z79899 Other long term (current) drug therapy: Secondary | ICD-10-CM | POA: Insufficient documentation

## 2013-10-12 DIAGNOSIS — A088 Other specified intestinal infections: Secondary | ICD-10-CM | POA: Insufficient documentation

## 2013-10-12 DIAGNOSIS — K219 Gastro-esophageal reflux disease without esophagitis: Secondary | ICD-10-CM | POA: Insufficient documentation

## 2013-10-12 DIAGNOSIS — G2581 Restless legs syndrome: Secondary | ICD-10-CM | POA: Insufficient documentation

## 2013-10-12 DIAGNOSIS — F259 Schizoaffective disorder, unspecified: Secondary | ICD-10-CM | POA: Insufficient documentation

## 2013-10-12 DIAGNOSIS — F411 Generalized anxiety disorder: Secondary | ICD-10-CM | POA: Insufficient documentation

## 2013-10-12 DIAGNOSIS — G43909 Migraine, unspecified, not intractable, without status migrainosus: Secondary | ICD-10-CM | POA: Insufficient documentation

## 2013-10-12 LAB — CBC WITH DIFFERENTIAL/PLATELET
Basophils Absolute: 0 10*3/uL (ref 0.0–0.1)
Basophils Relative: 0 % (ref 0–1)
Eosinophils Absolute: 0 10*3/uL (ref 0.0–0.7)
Eosinophils Relative: 0 % (ref 0–5)
HCT: 38.8 % (ref 36.0–46.0)
Hemoglobin: 14.2 g/dL (ref 12.0–15.0)
LYMPHS PCT: 14 % (ref 12–46)
Lymphs Abs: 1.2 10*3/uL (ref 0.7–4.0)
MCH: 29.9 pg (ref 26.0–34.0)
MCHC: 36.6 g/dL — ABNORMAL HIGH (ref 30.0–36.0)
MCV: 81.7 fL (ref 78.0–100.0)
Monocytes Absolute: 0.2 10*3/uL (ref 0.1–1.0)
Monocytes Relative: 2 % — ABNORMAL LOW (ref 3–12)
NEUTROS ABS: 6.9 10*3/uL (ref 1.7–7.7)
Neutrophils Relative %: 84 % — ABNORMAL HIGH (ref 43–77)
PLATELETS: 293 10*3/uL (ref 150–400)
RBC: 4.75 MIL/uL (ref 3.87–5.11)
RDW: 14.9 % (ref 11.5–15.5)
WBC: 8.3 10*3/uL (ref 4.0–10.5)

## 2013-10-12 LAB — POCT I-STAT TROPONIN I: TROPONIN I, POC: 0 ng/mL (ref 0.00–0.08)

## 2013-10-12 LAB — BASIC METABOLIC PANEL
BUN: 7 mg/dL (ref 6–23)
CALCIUM: 9.6 mg/dL (ref 8.4–10.5)
CHLORIDE: 95 meq/L — AB (ref 96–112)
CO2: 27 meq/L (ref 19–32)
Creatinine, Ser: 0.6 mg/dL (ref 0.50–1.10)
GFR calc Af Amer: >90 mL/min (ref >90)
GFR calc non Af Amer: >90 mL/min (ref >90)
Glucose, Bld: 122 mg/dL — ABNORMAL HIGH (ref 70–99)
Potassium: 4.1 mEq/L (ref 3.7–5.3)
SODIUM: 137 meq/L (ref 137–147)

## 2013-10-12 MED ORDER — SODIUM CHLORIDE 0.9 % IV BOLUS (SEPSIS)
1000.0000 mL | Freq: Once | INTRAVENOUS | Status: AC
  Administered 2013-10-12: 1000 mL via INTRAVENOUS

## 2013-10-12 MED ORDER — PROMETHAZINE HCL 25 MG/ML IJ SOLN
25.0000 mg | Freq: Once | INTRAMUSCULAR | Status: AC
  Administered 2013-10-12: 25 mg via INTRAVENOUS
  Filled 2013-10-12: qty 1

## 2013-10-12 MED ORDER — PROMETHAZINE HCL 25 MG/ML IJ SOLN
25.0000 mg | Freq: Once | INTRAMUSCULAR | Status: AC
  Administered 2013-10-13: 25 mg via INTRAVENOUS
  Filled 2013-10-12: qty 1

## 2013-10-12 MED ORDER — HYDROMORPHONE HCL PF 1 MG/ML IJ SOLN
1.0000 mg | Freq: Once | INTRAMUSCULAR | Status: AC
  Administered 2013-10-13: 1 mg via INTRAVENOUS
  Filled 2013-10-12: qty 1

## 2013-10-12 NOTE — ED Notes (Signed)
Phlebotomy at [facility]

## 2013-10-12 NOTE — ED Notes (Signed)
Patient with nausea/vomiting and diarrhea since 1pm today.  Patient states she has vomited multiple times with multiple episodes of diarrhea.

## 2013-10-13 MED ORDER — OXYCODONE-ACETAMINOPHEN 5-325 MG PO TABS: 2.0000 | ORAL_TABLET | ORAL | Status: DC | PRN

## 2013-10-13 MED ORDER — PROMETHAZINE HCL 25 MG PO TABS: 25.0000 mg | ORAL_TABLET | Freq: Four times a day (QID) | ORAL | Status: DC | PRN

## 2013-10-13 NOTE — Discharge Instructions (Signed)
Take Percocet as needed for pain. Take Phenergan as needed for nausea. Refer to attached documents for more information. Return to the ED with worsening or concerning symptoms.

## 2013-10-13 NOTE — ED Provider Notes (Signed)
CSN: [id number]     Arrival date & time 10/12/13  2202 History   First MD Initiated Contact with Patient 10/12/13 2231     Chief Complaint  Patient presents with  . Emesis  . Nausea  . Diarrhea   (Consider location/radiation/quality/duration/timing/severity/associated sxs/prior Treatment) HPI Comments: Patient is a 43 year old female who presents with abdominal pain since earlier today. The pain is located in her generalized abdomen and does not radiate. The pain is described as cramping and severe. The pain started gradually and progressively worsened since the onset. No alleviating/aggravating factors. The patient has tried nothing for symptoms without relief. Associated symptoms include NVD. Patient denies fever, headache, chest pain, SOB, dysuria, constipation, abnormal vaginal bleeding/discharge.      Past Medical History  Diagnosis Date  . Hypertension   . Overactive bladder   . Esophageal reflux   . Migraine   . Psychosis   . Schizophrenia, schizo-affective   . OCD (obsessive compulsive disorder)   . Bipolar 1 disorder   . Osteoarthritis   . Restless leg syndrome   . S/P lumbar fusion 2011  . Torn rotator cuff it:2012,rt:2013  . Migraines   . Depression     excessive daytime sleepiness-MSLT confirmed pathological degree of hypersomnia-12/13  . Anxiety   . Narcolepsy   . Migraine   . Hypersomnia   . Sleep apnea     uses CPAP  . Complication of anesthesia     reports gets cold and itch  . Asthma    Past Surgical History  Procedure Laterality Date  . Vesicovaginal fistula closure w/ tah  2001  . Tubal ligation  2004  . Shoulder surgery      left, bone spurs, torn muscle  . Abdominal hysterectomy  2001  . Spurs,ruptured disc bone  2009  . Lumbar fusion  2011  . Rotator cuff repair  2012,2013    torn   . Oophorectomy    . Ankle fusion    . Cholecystectomy N/A 07/20/2013    Procedure: LAPAROSCOPIC CHOLECYSTECTOMY;  Surgeon: Harl Bowie, MD;  Location: [facility];  Service: General;  Laterality: N/A;  . Colonoscopy with propofol N/A 10/02/2013    Procedure: COLONOSCOPY WITH PROPOFOL;  Surgeon: Garlan Fair, MD;  Location: [facility];  Service: Endoscopy;  Laterality: N/A;  . Esophagogastroduodenoscopy (egd) with propofol N/A 10/02/2013    Procedure: ESOPHAGOGASTRODUODENOSCOPY (EGD) WITH PROPOFOL;  Surgeon: Garlan Fair, MD;  Location: [facility];  Service: Endoscopy;  Laterality: N/A;   Family History  Problem Relation Age of Onset  . Cancer Mother     breast  . Cancer Father     prostate,colon  . Hypertension Father   . Thyroid disease Father   . High Cholesterol Father   . Thyroid disease Sister   . Cancer Paternal Aunt     colon  . Cancer Maternal Grandmother     kidney  . Cancer Paternal Grandmother     colon  . Cancer Paternal Aunt     lung  . Narcolepsy Neg Hx    History  Substance Use Topics  . Smoking status: Never Smoker   . Smokeless tobacco: Never Used  . Alcohol Use: No   OB History   Grav Para Term Preterm Abortions TAB SAB Ect Mult Living                 Review of Systems  Constitutional: Negative for fever, chills and fatigue.  HENT: Negative for trouble  swallowing.   Eyes: Negative for visual disturbance.  Respiratory: Negative for shortness of breath.   Cardiovascular: Negative for chest pain and palpitations.  Gastrointestinal: Positive for nausea, vomiting, abdominal pain and diarrhea.  Genitourinary: Negative for dysuria and difficulty urinating.  Musculoskeletal: Negative for arthralgias and neck pain.  Skin: Negative for color change.  Neurological: Negative for dizziness and weakness.  Psychiatric/Behavioral: Negative for dysphoric mood.    Allergies  Zofran; Adhesive; Aspirin; Benadryl; Codeine; Dust mite extract; Mold extract; Pollen extract; and Prednisone  Home Medications   Current Outpatient Rx  Name  Route  Sig  Dispense  Refill  . AMITIZA 24 MCG capsule   Oral   Take 24  mcg by mouth daily with breakfast.          . amphetamine-dextroamphetamine (ADDERALL) 20 MG tablet   Oral   Take 20 mg by mouth daily with lunch.         . budesonide-formoterol (SYMBICORT) 160-4.5 MCG/ACT inhaler   Inhalation   Inhale 2 puffs into the lungs 2 (two) times daily.           . celecoxib (CELEBREX) 200 MG capsule   Oral   Take 200 mg by mouth daily.          . clobetasol (TEMOVATE) 0.05 % cream   Topical   Apply 1 application topically 2 (two) times daily.          . Clobetasol Propionate (CLOBEX) 0.05 % shampoo   Topical   Apply 1 application topically once a week. wednesday         . clotrimazole-betamethasone (LOTRISONE) lotion   Topical   Apply 1 application topically daily as needed (for rash).          . cycloSPORINE (RESTASIS) 0.05 % ophthalmic emulsion   Both Eyes   Place 1 drop into both eyes every 12 (twelve) hours.           Marland Kitchen dicyclomine (BENTYL) 20 MG tablet   Oral   Take 1 tablet (20 mg total) by mouth every 6 (six) hours as needed for spasms (for abdominal cramping).   20 tablet   0   . fentaNYL (DURAGESIC - DOSED MCG/HR) 75 MCG/HR   Transdermal   Place 75 mcg onto the skin every other day.         . fesoterodine (TOVIAZ) 8 MG TB24 tablet   Oral   Take 8 mg by mouth daily.         . fluocinolone (DERMA-SMOOTHE/FS BODY) 0.01 % external oil   Topical   Apply 1 application topically 3 (three) times daily.          . fluocinonide (LIDEX) 0.05 % cream   Topical   Apply 1 application topically 3 (three) times a week.          . gabapentin (NEURONTIN) 300 MG capsule   Oral   Take 300 mg by mouth 2 (two) times daily.          . hydrochlorothiazide (HYDRODIURIL) 25 MG tablet   Oral   Take 25 mg by mouth every morning.          Marland Kitchen HYPERCARE 20 % external solution   Topical   Apply 1 application topically daily as needed (to affected area).          . loteprednol (LOTEMAX) 0.2 % SUSP   Both Eyes   Place 1  drop into both eyes daily.         Marland Kitchen  lurasidone (LATUDA) 40 MG TABS   Oral   Take 40 mg by mouth at bedtime.          . Olopatadine HCl (PATADAY) 0.2 % SOLN   Both Eyes   Place 1 drop into both eyes 2 (two) times daily as needed (for allergies).          . OnabotulinumtoxinA (BOTOX IJ)   Injection   Inject as directed every 3 (three) months.         . pantoprazole (PROTONIX) 20 MG tablet   Oral   Take 20 mg by mouth daily.         . polyethylene glycol (MIRALAX / GLYCOLAX) packet   Oral   Take 17 g by mouth daily as needed for moderate constipation.          . pramipexole (MIRAPEX) 0.5 MG tablet   Oral   Take 0.5 mg by mouth at bedtime.          . Sodium Oxybate 500 MG/ML SOLN   Oral   Take 9 mLs by mouth at bedtime.         Marland Kitchen tiZANidine (ZANAFLEX) 4 MG tablet   Oral   Take 4 mg by mouth 2 (two) times daily.          Marland Kitchen topiramate (TOPAMAX) 100 MG tablet   Oral   Take 100-200 mg by mouth 2 (two) times daily. 1 tab every AM and 2 at night         . triamcinolone cream (KENALOG) 0.1 %   Topical   Apply 1 application topically daily as needed (to affected area).          . verapamil (VERELAN PM) 240 MG 24 hr capsule   Oral   Take 240 mg by mouth at bedtime.            BP 172/109  Pulse 97  Temp(Src) 98.3 F (36.8 C) (Oral)  Resp 20  Wt 171 lb (77.565 kg)  SpO2 99% Physical Exam  Nursing note and vitals reviewed. Constitutional: She is oriented to person, place, and time. She appears well-developed and well-nourished. No distress.  HENT:  Head: Normocephalic and atraumatic.  Eyes: Conjunctivae and EOM are normal.  Neck: Normal range of motion.  Cardiovascular: Normal rate and regular rhythm.  Exam reveals no gallop and no friction rub.   No murmur heard. Pulmonary/Chest: Effort normal and breath sounds normal. She has no wheezes. She has no rales. She exhibits no tenderness.  Abdominal: Soft. She exhibits no distension. There is  tenderness. There is no rebound and no guarding.  Mild generalized tenderness to palpation without focal tenderness or peritoneal signs.   Musculoskeletal: Normal range of motion.  Neurological: She is alert and oriented to person, place, and time. Coordination normal.  Speech is goal-oriented. Moves limbs without ataxia.   Skin: Skin is warm and dry.  Psychiatric: She has a normal mood and affect. Her behavior is normal.    ED Course  Procedures (including critical care time) Labs Review Labs Reviewed  CBC WITH DIFFERENTIAL - Abnormal; Notable for the following:    MCHC 36.6 (*)    Neutrophils Relative % 84 (*)    Monocytes Relative 2 (*)    All other components within normal limits  BASIC METABOLIC PANEL - Abnormal; Notable for the following:    Chloride 95 (*)    Glucose, Bld 122 (*)    All other components within normal limits  POCT  I-STAT TROPONIN I   Imaging Review No results found.  EKG Interpretation    Date/Time:  Friday October 12 2013 22:30:41 EST Ventricular Rate:  74 PR Interval:  150 QRS Duration: 93 QT Interval:  390 QTC Calculation: 433 R Axis:   36 Text Interpretation:  Sinus rhythm Probable left atrial enlargement Confirmed by MASNERI  MD, DAVID ([id number]) on 10/12/2013 10:33:32 PM            MDM   1. Viral gastroenteritis     12:43 AM Labs unremarkable for acute changes. EKG unremarkable for acute changes. Patient likely has viral illness. Patient received fluids, dilaudid, zofran and phenergan. Vitals stable and patient afebrile. Patient will be discharged with pain and nausea medication. Patient will return to the ED with worsening or concerning symptoms.    Alvina Chou, [place] 10/13/13 2336

## 2013-10-13 NOTE — ED Provider Notes (Signed)
Medical screening examination/treatment/procedure(s) were performed by non-physician practitioner and as supervising physician I was immediately available for consultation/collaboration.  EKG Interpretation    Date/Time:  Friday October 12 2013 22:30:41 EST Ventricular Rate:  74 PR Interval:  150 QRS Duration: 93 QT Interval:  390 QTC Calculation: 433 R Axis:   36 Text Interpretation:  Sinus rhythm Probable left atrial enlargement Confirmed by MASNERI  MD, DAVID ([id number]) on 10/12/2013 10:33:32 PM              Elmer Sow, MD 10/13/13 [phone]

## 2013-10-15 ENCOUNTER — Inpatient Hospital Stay: Admission: RE | Admit: 2013-10-15 | Payer: Federal, State, Local not specified - PPO | Source: Ambulatory Visit

## 2013-10-19 ENCOUNTER — Ambulatory Visit
Admission: RE | Admit: 2013-10-19 | Discharge: 2013-10-19 | Disposition: A | Discharge status: Home / Self Care | Payer: Federal, State, Local not specified - PPO | Source: Ambulatory Visit | Attending: Gastroenterology | Admitting: Gastroenterology

## 2013-10-19 DIAGNOSIS — R109 Unspecified abdominal pain: Secondary | ICD-10-CM

## 2013-10-19 DIAGNOSIS — R197 Diarrhea, unspecified: Secondary | ICD-10-CM

## 2013-10-19 DIAGNOSIS — R1110 Vomiting, unspecified: Secondary | ICD-10-CM

## 2013-10-19 MED ORDER — IOHEXOL 300 MG/ML  SOLN
100.0000 mL | Freq: Once | INTRAMUSCULAR | Status: AC | PRN
  Administered 2013-10-19: 100 mL via INTRAVENOUS

## 2013-11-27 NOTE — Patient Instructions (Addendum)
   Your procedure is scheduled on: Wednesday, April 1  Enter through the [employer] [facility] at: [place] up the phone at the desk and dial [phone] and inform us of your arrival.  Please call this number if you have any problems the morning of surgery: [phone]  Remember: Do not eat or drink after midnight: Tuesday Take these medicines the morning of surgery with a SIP OF WATER: verapamil, HCTZ, neurontin, latuda, protonix, topamax, symbicort inhaler, ok to leave on fentanyl patch.  Do not wear jewelry, make-up, or FINGER nail polish No metal in your hair or on your body. Do not wear lotions, powders, perfumes.  You may wear deodorant.  Do not bring valuables to the hospital. Contacts, dentures or bridgework may not be worn into surgery.   Patients discharged on the day of surgery will not be allowed to drive home.  Home with Rosendo Gros cell [phone].

## 2013-11-28 ENCOUNTER — Encounter (HOSPITAL_COMMUNITY): Payer: Self-pay

## 2013-11-28 ENCOUNTER — Encounter (HOSPITAL_COMMUNITY)
Admission: RE | Admit: 2013-11-28 | Discharge: 2013-11-28 | Disposition: A | Discharge status: Home / Self Care | Payer: Federal, State, Local not specified - PPO | Source: Ambulatory Visit | Attending: Obstetrics and Gynecology | Admitting: Obstetrics and Gynecology

## 2013-11-28 DIAGNOSIS — Z01812 Encounter for preprocedural laboratory examination: Secondary | ICD-10-CM | POA: Insufficient documentation

## 2013-11-28 HISTORY — DX: Other seasonal allergic rhinitis: J30.2

## 2013-11-28 HISTORY — DX: Schizophrenia, unspecified: F20.9

## 2013-11-28 LAB — CBC
HEMATOCRIT: 39.2 % (ref 36.0–46.0)
HEMOGLOBIN: 13.7 g/dL (ref 12.0–15.0)
MCH: 29.2 pg (ref 26.0–34.0)
MCHC: 34.9 g/dL (ref 30.0–36.0)
MCV: 83.6 fL (ref 78.0–100.0)
Platelets: 278 10*3/uL (ref 150–400)
RBC: 4.69 MIL/uL (ref 3.87–5.11)
RDW: 13.5 % (ref 11.5–15.5)
WBC: 5.2 10*3/uL (ref 4.0–10.5)

## 2013-11-28 LAB — BASIC METABOLIC PANEL
BUN: 5 mg/dL — AB (ref 6–23)
CO2: 30 mEq/L (ref 19–32)
CREATININE: 0.68 mg/dL (ref 0.50–1.10)
Calcium: 9.4 mg/dL (ref 8.4–10.5)
Chloride: 100 mEq/L (ref 96–112)
GFR calc Af Amer: >90 mL/min (ref >90)
Glucose, Bld: 88 mg/dL (ref 70–99)
Potassium: 3.3 mEq/L — ABNORMAL LOW (ref 3.7–5.3)
Sodium: 142 mEq/L (ref 137–147)

## 2013-12-04 MED ORDER — CEFAZOLIN SODIUM-DEXTROSE 2-3 GM-% IV SOLR
2.0000 g | INTRAVENOUS | Status: AC
  Administered 2013-12-05: 2 g via INTRAVENOUS

## 2013-12-05 ENCOUNTER — Ambulatory Visit (HOSPITAL_COMMUNITY)
Admission: RE | Admit: 2013-12-05 | Discharge: 2013-12-05 | Disposition: A | Discharge status: Home / Self Care | Payer: Federal, State, Local not specified - PPO | Source: Ambulatory Visit | Attending: Obstetrics and Gynecology | Admitting: Obstetrics and Gynecology

## 2013-12-05 ENCOUNTER — Encounter (HOSPITAL_COMMUNITY): Payer: Federal, State, Local not specified - PPO | Admitting: Registered Nurse

## 2013-12-05 ENCOUNTER — Ambulatory Visit (HOSPITAL_COMMUNITY): Payer: Federal, State, Local not specified - PPO | Admitting: Registered Nurse

## 2013-12-05 ENCOUNTER — Encounter (HOSPITAL_COMMUNITY): Payer: Self-pay | Admitting: Registered Nurse

## 2013-12-05 ENCOUNTER — Encounter (HOSPITAL_COMMUNITY)
Admission: RE | Disposition: A | Discharge status: Home / Self Care | Payer: Self-pay | Source: Ambulatory Visit | Attending: Obstetrics and Gynecology

## 2013-12-05 DIAGNOSIS — N8300 Follicular cyst of ovary, unspecified side: Secondary | ICD-10-CM | POA: Insufficient documentation

## 2013-12-05 DIAGNOSIS — N83209 Unspecified ovarian cyst, unspecified side: Secondary | ICD-10-CM | POA: Insufficient documentation

## 2013-12-05 DIAGNOSIS — M1990 Unspecified osteoarthritis, unspecified site: Secondary | ICD-10-CM | POA: Insufficient documentation

## 2013-12-05 DIAGNOSIS — N318 Other neuromuscular dysfunction of bladder: Secondary | ICD-10-CM | POA: Insufficient documentation

## 2013-12-05 DIAGNOSIS — R112 Nausea with vomiting, unspecified: Secondary | ICD-10-CM | POA: Insufficient documentation

## 2013-12-05 DIAGNOSIS — G47419 Narcolepsy without cataplexy: Secondary | ICD-10-CM | POA: Insufficient documentation

## 2013-12-05 DIAGNOSIS — F17200 Nicotine dependence, unspecified, uncomplicated: Secondary | ICD-10-CM | POA: Insufficient documentation

## 2013-12-05 DIAGNOSIS — R109 Unspecified abdominal pain: Secondary | ICD-10-CM | POA: Insufficient documentation

## 2013-12-05 DIAGNOSIS — F319 Bipolar disorder, unspecified: Secondary | ICD-10-CM | POA: Insufficient documentation

## 2013-12-05 DIAGNOSIS — Z803 Family history of malignant neoplasm of breast: Secondary | ICD-10-CM | POA: Insufficient documentation

## 2013-12-05 DIAGNOSIS — G4730 Sleep apnea, unspecified: Secondary | ICD-10-CM | POA: Insufficient documentation

## 2013-12-05 DIAGNOSIS — Z90721 Acquired absence of ovaries, unilateral: Secondary | ICD-10-CM

## 2013-12-05 DIAGNOSIS — Z90710 Acquired absence of both cervix and uterus: Secondary | ICD-10-CM | POA: Insufficient documentation

## 2013-12-05 DIAGNOSIS — I10 Essential (primary) hypertension: Secondary | ICD-10-CM | POA: Insufficient documentation

## 2013-12-05 DIAGNOSIS — K219 Gastro-esophageal reflux disease without esophagitis: Secondary | ICD-10-CM | POA: Insufficient documentation

## 2013-12-05 HISTORY — PX: LAPAROSCOPIC SALPINGO OOPHERECTOMY: SHX5927

## 2013-12-05 SURGERY — SALPINGO-OOPHORECTOMY, LAPAROSCOPIC
Anesthesia: General | Site: Abdomen | Laterality: Left

## 2013-12-05 MED ORDER — ROCURONIUM BROMIDE 100 MG/10ML IV SOLN
INTRAVENOUS | Status: DC | PRN
  Administered 2013-12-05: 40 mg via INTRAVENOUS

## 2013-12-05 MED ORDER — GLYCOPYRROLATE 0.2 MG/ML IJ SOLN
INTRAMUSCULAR | Status: AC
  Filled 2013-12-05: qty 2

## 2013-12-05 MED ORDER — FENTANYL CITRATE 0.05 MG/ML IJ SOLN
INTRAMUSCULAR | Status: AC
  Filled 2013-12-05: qty 5

## 2013-12-05 MED ORDER — MIDAZOLAM HCL 2 MG/2ML IJ SOLN
INTRAMUSCULAR | Status: AC
  Filled 2013-12-05: qty 2

## 2013-12-05 MED ORDER — BUPIVACAINE HCL (PF) 0.25 % IJ SOLN
INTRAMUSCULAR | Status: AC
  Filled 2013-12-05: qty 30

## 2013-12-05 MED ORDER — MIDAZOLAM HCL 5 MG/5ML IJ SOLN
INTRAMUSCULAR | Status: DC | PRN
  Administered 2013-12-05: 2 mg via INTRAVENOUS

## 2013-12-05 MED ORDER — NEOSTIGMINE METHYLSULFATE 1 MG/ML IJ SOLN
INTRAMUSCULAR | Status: DC | PRN
  Administered 2013-12-05: 4 mg via INTRAVENOUS

## 2013-12-05 MED ORDER — NEOSTIGMINE METHYLSULFATE 1 MG/ML IJ SOLN
INTRAMUSCULAR | Status: AC
  Filled 2013-12-05: qty 1

## 2013-12-05 MED ORDER — PROPOFOL 10 MG/ML IV BOLUS
INTRAVENOUS | Status: DC | PRN
  Administered 2013-12-05: 200 mg via INTRAVENOUS

## 2013-12-05 MED ORDER — LACTATED RINGERS IV SOLN
INTRAVENOUS | Status: DC
  Administered 2013-12-05 (×2): via INTRAVENOUS

## 2013-12-05 MED ORDER — HYDROMORPHONE HCL PF 1 MG/ML IJ SOLN
INTRAMUSCULAR | Status: AC
  Filled 2013-12-05: qty 1

## 2013-12-05 MED ORDER — KETOROLAC TROMETHAMINE 30 MG/ML IJ SOLN
INTRAMUSCULAR | Status: AC
  Filled 2013-12-05: qty 1

## 2013-12-05 MED ORDER — ROCURONIUM BROMIDE 100 MG/10ML IV SOLN
INTRAVENOUS | Status: AC
  Filled 2013-12-05: qty 1

## 2013-12-05 MED ORDER — HYDROMORPHONE HCL PF 1 MG/ML IJ SOLN
INTRAMUSCULAR | Status: DC | PRN
  Administered 2013-12-05: 1 mg via INTRAVENOUS
  Administered 2013-12-05 (×2): 0.5 mg via INTRAVENOUS

## 2013-12-05 MED ORDER — FENTANYL CITRATE 0.05 MG/ML IJ SOLN
INTRAMUSCULAR | Status: AC
  Administered 2013-12-05: 50 ug via INTRAVENOUS
  Filled 2013-12-05: qty 2

## 2013-12-05 MED ORDER — ONDANSETRON HCL 4 MG/2ML IJ SOLN
INTRAMUSCULAR | Status: AC
  Filled 2013-12-05: qty 2

## 2013-12-05 MED ORDER — KETAMINE HCL 10 MG/ML IJ SOLN
INTRAMUSCULAR | Status: DC | PRN
  Administered 2013-12-05: 20 mg via INTRAVENOUS

## 2013-12-05 MED ORDER — ACETAMINOPHEN 160 MG/5ML PO SOLN
1000.0000 mg | Freq: Once | ORAL | Status: AC
  Administered 2013-12-05: 1000 mg via ORAL

## 2013-12-05 MED ORDER — DEXAMETHASONE SODIUM PHOSPHATE 10 MG/ML IJ SOLN
INTRAMUSCULAR | Status: AC
  Filled 2013-12-05: qty 1

## 2013-12-05 MED ORDER — FENTANYL CITRATE 0.05 MG/ML IJ SOLN
25.0000 ug | INTRAMUSCULAR | Status: DC | PRN
  Administered 2013-12-05: 25 ug via INTRAVENOUS
  Administered 2013-12-05: 50 ug via INTRAVENOUS
  Administered 2013-12-05: 25 ug via INTRAVENOUS

## 2013-12-05 MED ORDER — HYDROMORPHONE HCL 2 MG PO TABS: 2.0000 mg | ORAL_TABLET | ORAL | Status: DC | PRN

## 2013-12-05 MED ORDER — ACETAMINOPHEN 160 MG/5ML PO SOLN
ORAL | Status: AC
  Administered 2013-12-05: 1000 mg via ORAL
  Filled 2013-12-05: qty 40.6

## 2013-12-05 MED ORDER — HYDROMORPHONE HCL 2 MG PO TABS
ORAL_TABLET | ORAL | Status: AC
  Filled 2013-12-05: qty 1

## 2013-12-05 MED ORDER — LIDOCAINE HCL (CARDIAC) 20 MG/ML IV SOLN
INTRAVENOUS | Status: AC
  Filled 2013-12-05: qty 5

## 2013-12-05 MED ORDER — LIDOCAINE HCL (CARDIAC) 20 MG/ML IV SOLN
INTRAVENOUS | Status: DC | PRN
  Administered 2013-12-05: 50 mg via INTRAVENOUS

## 2013-12-05 MED ORDER — KETOROLAC TROMETHAMINE 30 MG/ML IJ SOLN
INTRAMUSCULAR | Status: DC | PRN
  Administered 2013-12-05: 30 mg via INTRAVENOUS

## 2013-12-05 MED ORDER — CEFAZOLIN SODIUM-DEXTROSE 2-3 GM-% IV SOLR
INTRAVENOUS | Status: AC
  Filled 2013-12-05: qty 50

## 2013-12-05 MED ORDER — ACETAMINOPHEN 160 MG/5ML PO SOLN: 1000.0000 mg | Freq: Four times a day (QID) | ORAL | Status: DC | PRN

## 2013-12-05 MED ORDER — GLYCOPYRROLATE 0.2 MG/ML IJ SOLN
INTRAMUSCULAR | Status: AC
  Filled 2013-12-05: qty 1

## 2013-12-05 MED ORDER — ESTROGENS CONJUGATED 0.625 MG PO TABS: 0.6250 mg | ORAL_TABLET | Freq: Every day | ORAL | Status: DC

## 2013-12-05 MED ORDER — KETAMINE HCL 10 MG/ML IJ SOLN
INTRAMUSCULAR | Status: AC
  Filled 2013-12-05: qty 1

## 2013-12-05 MED ORDER — BUPIVACAINE HCL (PF) 0.25 % IJ SOLN
INTRAMUSCULAR | Status: DC | PRN
  Administered 2013-12-05: 18 mL

## 2013-12-05 MED ORDER — PROPOFOL 10 MG/ML IV EMUL
INTRAVENOUS | Status: AC
  Filled 2013-12-05: qty 20

## 2013-12-05 MED ORDER — FENTANYL CITRATE 0.05 MG/ML IJ SOLN
INTRAMUSCULAR | Status: DC | PRN
  Administered 2013-12-05: 50 ug via INTRAVENOUS
  Administered 2013-12-05: 100 ug via INTRAVENOUS
  Administered 2013-12-05: 150 ug via INTRAVENOUS
  Administered 2013-12-05: 100 ug via INTRAVENOUS
  Administered 2013-12-05: 50 ug via INTRAVENOUS

## 2013-12-05 MED ORDER — HYDROMORPHONE HCL 2 MG PO TABS
2.0000 mg | ORAL_TABLET | ORAL | Status: DC | PRN
  Administered 2013-12-05: 2 mg via ORAL

## 2013-12-05 MED ORDER — GLYCOPYRROLATE 0.2 MG/ML IJ SOLN
INTRAMUSCULAR | Status: DC | PRN
  Administered 2013-12-05: 0.6 mg via INTRAVENOUS

## 2013-12-05 SURGICAL SUPPLY — 27 items
BENZOIN TINCTURE PRP APPL 2/3 (GAUZE/BANDAGES/DRESSINGS) IMPLANT
CHLORAPREP W/TINT 26ML (MISCELLANEOUS) ×2 IMPLANT
CLOTH BEACON ORANGE TIMEOUT ST (SAFETY) ×2 IMPLANT
DERMABOND ADVANCED (GAUZE/BANDAGES/DRESSINGS) ×1
DERMABOND ADVANCED .7 DNX12 (GAUZE/BANDAGES/DRESSINGS) ×1 IMPLANT
FORCEPS CUTTING 33CM 5MM (CUTTING FORCEPS) IMPLANT
GLOVE BIO SURGEON STRL SZ7 (GLOVE) ×2 IMPLANT
GLOVE BIOGEL PI IND STRL 7.0 (GLOVE) ×2 IMPLANT
GLOVE BIOGEL PI INDICATOR 7.0 (GLOVE) ×2
GOWN STRL REUS W/TWL LRG LVL3 (GOWN DISPOSABLE) ×4 IMPLANT
PACK LAPAROSCOPY BASIN (CUSTOM PROCEDURE TRAY) ×2 IMPLANT
POUCH SPECIMEN RETRIEVAL 10MM (ENDOMECHANICALS) ×2 IMPLANT
PROTECTOR NERVE ULNAR (MISCELLANEOUS) ×2 IMPLANT
SET IRRIG TUBING LAPAROSCOPIC (IRRIGATION / IRRIGATOR) IMPLANT
SHEARS HARMONIC ACE PLUS 36CM (ENDOMECHANICALS) ×2 IMPLANT
STRIP CLOSURE SKIN 1/4X4 (GAUZE/BANDAGES/DRESSINGS) IMPLANT
SUT MNCRL AB 4-0 PS2 18 (SUTURE) ×2 IMPLANT
SUT PLAIN 2 0 (SUTURE)
SUT PLAIN ABS 2-0 CT1 27XMFL (SUTURE) IMPLANT
SUT VICRYL 0 UR6 27IN ABS (SUTURE) ×2 IMPLANT
TOWEL OR 17X24 6PK STRL BLUE (TOWEL DISPOSABLE) ×4 IMPLANT
TRAY FOLEY CATH 14FR (SET/KITS/TRAYS/PACK) ×2 IMPLANT
TROCAR XCEL NON-BLD 11X100MML (ENDOMECHANICALS) IMPLANT
TROCAR XCEL NON-BLD 5MMX100MML (ENDOMECHANICALS) ×2 IMPLANT
TROCAR XCEL OPT SLVE 5M 100M (ENDOMECHANICALS) ×2 IMPLANT
WARMER LAPAROSCOPE (MISCELLANEOUS) ×2 IMPLANT
WATER STERILE IRR 1000ML POUR (IV SOLUTION) ×2 IMPLANT

## 2013-12-05 NOTE — Interval H&P Note (Signed)
History and Physical Interval Note:  12/05/2013 8:52 AM  [name]  has presented today for surgery, with the diagnosis of Left Ovarian Cyst  The various methods of treatment have been discussed with the patient and family. After consideration of risks, benefits and other options for treatment, the patient has consented to  Procedure(s): LAPAROSCOPIC SALPINGO OOPHORECTOMY (Left) as a surgical intervention .  The patient's history has been reviewed, patient examined, no change in status, stable for surgery.  I have reviewed the patient's chart and labs.  Questions were answered to the patient's satisfaction.    Pt still desires to proceed with USO.  Understands she will need HRT.  Already having hot flashes. Simona Huh, Darlis Wragg

## 2013-12-05 NOTE — Brief Op Note (Signed)
12/05/2013  10:55 AM  PATIENT:  [name]  43 y.o. female  PRE-OPERATIVE DIAGNOSIS:  Left Ovarian Cyst, abdominal pain  POST-OPERATIVE DIAGNOSIS:  same  PROCEDURE:  Procedure(s): LAPAROSCOPIC SALPINGO OOPHORECTOMY (Left), Peritoneal biopsies  SURGEON:  Surgeon(s) and Role:    * Thurnell Lose, MD - Primary    * Annalee Genta, DO - Assisting  PHYSICIAN ASSISTANT:   ASSISTANTS: Dr. Janyth Pupa   ANESTHESIA:   general  EBL:  Total I/O In: 1400 [I.V.:1400] Out: 125 [Urine:100; Blood:25]  BLOOD ADMINISTERED:none  DRAINS: Urinary Catheter (Foley)   LOCAL MEDICATIONS USED:  MARCAINE     SPECIMEN:  Source of Specimen:  Left ovary and tube, vaginal cuff biopsy, right pelvic sidewall biopsy  DISPOSITION OF SPECIMEN:  PATHOLOGY  COUNTS:  YES  TOURNIQUET:  * No tourniquets in log *  DICTATION: .Other Dictation: Dictation Number [id number]...  PLAN OF CARE: Discharge to home after PACU  PATIENT DISPOSITION:  PACU - hemodynamically stable.   Delay start of Pharmacological VTE agent (>24hrs) due to surgical blood loss or risk of bleeding: yes

## 2013-12-05 NOTE — Discharge Instructions (Signed)
Diagnostic Laparoscopy Laparoscopy is a surgical procedure. It is used to diagnose and treat diseases inside the belly (abdomen). It is usually a brief, common, and relatively simple procedure. The laparoscopeis a thin, lighted, pencil-sized instrument. It is like a telescope. It is inserted into your abdomen through a small cut (incision). Your caregiver can look at the organs inside your body through this instrument. He or she can see if there is anything abnormal. Laparoscopy can be done either in a hospital or outpatient clinic. You may be given a mild sedative to help you relax before the procedure. Once in the operating room, you will be given a drug to make you sleep (general anesthesia). Laparoscopy usually lasts less than 1 hour. After the procedure, you will be monitored in a recovery area until you are stable and doing well. Once you are home, it will take 2 to 3 days to fully recover. RISKS AND COMPLICATIONS  Laparoscopy has relatively few risks. Your caregiver will discuss the risks with you before the procedure. Some problems that can occur include:  Infection.  Bleeding.  Damage to other organs.  Anesthetic side effects. PROCEDURE Once you receive anesthesia, your surgeon inflates the abdomen with a harmless gas (carbon dioxide). This makes the organs easier to see. The laparoscope is inserted into the abdomen through a small incision. This allows your surgeon to see into the abdomen. Other small instruments are also inserted into the abdomen through other small openings. Many surgeons attach a video camera to the laparoscope to enlarge the view. During a diagnostic laparoscopy, the surgeon may be looking for inflammation, infection, or cancer. Your surgeon may take tissue samples(biopsies). The samples are sent to a specialist in looking at cells and tissue samples (pathologist). The pathologist examines them under a microscope. Biopsies can help to diagnose or confirm a  disease. AFTER THE PROCEDURE   The gas is released from inside the abdomen.  The incisions are closed with stitches (sutures). Because these incisions are small (usually less than 1/2 inch), there is usually minimal discomfort after the procedure. There may be some mild discomfort in the throat. This is from the tube placed in the throat while you were sleeping. You may have some mild abdominal discomfort. There may also be discomfort from the instrument placement incisions in the abdomen.  The recovery time is shortened as long as there are no complications.  You will rest in a recovery room until stable and doing well. As long as there are no complications, you may be allowed to go home. FINDING OUT THE RESULTS OF YOUR TEST Not all test results are available during your visit. If your test results are not back during the visit, make an appointment with your caregiver to find out the results. Do not assume everything is normal if you have not heard from your caregiver or the medical facility. It is important for you to follow up on all of your test results. HOME CARE INSTRUCTIONS   Take all medicines as directed.  Only take over-the-counter or prescription medicines for pain, discomfort, or fever as directed by your caregiver.  Resume daily activities as directed.  Showers are preferred over baths.  You may resume sexual activities in 1 week or as directed.  Do not drive while taking narcotics. SEEK MEDICAL CARE IF:   There is increasing abdominal pain.  There is new pain in the shoulders (shoulder strap areas).  You feel lightheaded or faint.  You have the chills.  You or  increasing abdominal pain.  · There is new pain in the shoulders (shoulder strap areas).  · You feel lightheaded or faint.  · You have the chills.  · You or your child has an oral temperature above 102° F (38.9° C).  · There is pus-like (purulent) drainage from any of the wounds.  · You are unable to pass gas or have a bowel movement.  · You feel sick to your stomach (nauseous) or throw up (vomit).  MAKE SURE YOU:   · Understand these instructions.  · Will watch  your condition.  · Will get help right away if you are not doing well or get worse.  Document Released: 11/29/2000 Document Revised: 12/18/2012 Document Reviewed: 08/23/2007  [employer]® Patient Information ©2014 [employer].

## 2013-12-05 NOTE — Anesthesia Postprocedure Evaluation (Signed)
  Anesthesia Post-op Note  Patient: [name]  Procedure(s) Performed: Procedure(s): LAPAROSCOPIC SALPINGO OOPHORECTOMY (Left)  Patient Location: PACU  Anesthesia Type:General  Level of Consciousness: awake, alert  and oriented  Airway and Oxygen Therapy: Patient Spontanous Breathing  Post-op Pain: moderate  Post-op Assessment: Post-op Vital signs reviewed, Patient's Cardiovascular Status Stable, Respiratory Function Stable, Patent Airway, No signs of Nausea or vomiting and Pain level controlled  Post-op Vital Signs: Reviewed and stable  Complications: No apparent anesthesia complications

## 2013-12-05 NOTE — H&P (Addendum)
History of Present Illness  General:  44 y/o G3P3 s/p hysterectomy/USO due to endometriosis now with persistant nausea and vomiting presents for f/u. Pt has a h/o a simple cyst in her remaining ovary that has increased from 1.1 cm to 3.0 cm by a recent CT scan.  She currently can not tolerate a regular diet with n/v. Drinks mostly liquids. She has lost 15 pounds in the last 3-4 months.  She has also had multiple ER visits. Pt is s/p endoscopy and colonoscopy which were negative. She had her gallbladder removed which allowed her to eat normal food for 2 weeks then the symptoms resumed. Pt states, under the advisement of her GI, she would like to have her ovary removed b/c that may be the problem. Pt is aware that she will need HRT for her symptoms. Pt denies pelvic pain, it is mostly upper abdomen.   Current Medications  Taking   Amitiza 24 Capsule TAKE ONE CAPSULE BY MOUTH TWICE DAILY FOR CONSTIPATION   Hydrochlorothiazide 25 Tablet TAKE ONE TABLET BY MOUTH ONE TIME DAILY.   Toviaz 8 MG Tablet Extended Release 24 Hour 1 tablet Once a day   Not-Taking/PRN   Relpax 40 MG Tablet 1 tablet at onset of headache, may repeat after 2 hours if headache returns as needed   Unknown   Hypercare 20 Solution APPLY UNDER ARMS AT BEDTIME TWICE WEEKLY AS DIRECTED. [place] OFF IN THE MORNING   Triamcinolone Acetonide 0.1 % Cream apply to affected area twice - three times a day as needed derm   Lotrisone 0-2.72 % Lotion 1 application to affected area Twice a day   Verapamil HCl 240 mg Capsule Extended Release 24 Hour 1 capsule every morning once a day per neuro   Topamax 100 MG Tablet 1 tab in morning, 2 tabs in evening three times a day   Neurontin 300 MG Capsule 1 capsule three times a day   Pramipexole Dihydrochloride 0.5 MG Tablet 1 tablet per Dr. Noberto Retort at bedtime   Restasis 0.05 % Emulsion 1 into affected eye Twice a day   Clobex 5.36 % Shampoo 1 application to dry scalp Once a day   Polyethylene Glycol  Powder as directed as needed   Derma-Smoothe/FS Body 6.44 % Oil 1 application a thin film to affected area as needed   Fluocinonide 0.34 % Cream 1 application to affected area Three times a Week   Clobetasol Propionate 7.42 % Cream 1 application to affected area Twice a day   LaMICtal XR 200 mg Tablet Extended Release 24 Hour as directed once a day   Celebrex 200 MG Capsule 1 capsule Once a day   Xyrem 500 MG/ML Solution as directed twice at night   Fentanyl 75 MCG/HR Patch 72 Hour 1 patch to the skin q 2 days-Dr. Nelva Bush   Adderall 10 MG Tablet 1 tablet Once a day for excessive daytime sleepiness ([place])   Drysol 20 % Solution as directed apply under arms at bedtime two times a week. Wash off in the morning.   Protonix 20 MG Tablet Delayed Release 2 tablets Once a day   Medication List reviewed and reconciled with the patient    Past Medical History  Hypertension  Overactive bladder / stress incontinence  Esophageal reflux (dx per Dr. Elsworth Soho at [facility] pulmonology)  migraine headache [facility])  Schizophrenia/ Psychosis/ OCD/ Bipolar D/O [facility])  Osteoarthritis/ DDD ([facility])  Narcolepsy ([place])  Sleep apnea ([place])   Surgical History  partial hyst and  Right ovary   back surgery   ankle surgery   Left shoulder [facility]) 01/2011  Gallbladder removed 07/2013   Family History  Father: alive, thyroid d/o, Colon cancer, Prostate cancer  Mother: alive, Breast Ca (dx at age 76)  Sister 1: thyroid d/o  Strong family history of Breast Cancer on Maternal side of family.   Social History  General:  Tobacco use  cigarettes: Never smoked Tobacco history last updated 11/19/2013 no Smoking.  no Alcohol.  Caffeine: yes.  no Recreational drug use.  Exercise: unable to exercise at this time d/t orthopedic issues.  Occupation: Not working.  Marital Status: single.  Children: 3.  Same sex ptn x 12 years.   Gyn History  Periods : hysterectomy.  Denies H/O LMP.   Denies H/O Birth control.  Last pap smear date several years ago.  Last mammogram date 03/28/13.  Denies H/O Abnormal pap smear.  Denies H/O STD.    OB History  Number of pregnancies 3.  Pregnancy # 1 live birth, vaginal delivery, girl.  Pregnancy # 2 live birth, vaginal delivery, girl.  Pregnancy # 3 live birth, vaginal delivery, boy.    Allergies  Aspirin: GI upset: Side Effects  Benadryl: feels irritated: Side Effects  prednisone: feels irritated: Side Effects   Hospitalization/Major Diagnostic Procedure  see surgery    Review of Systems  See HPI Denies fever/chills, chest pain, SOB, headaches, numbness/tingling. No h/o complication with anesthesia, bleeding disorders or blood clots.   Vital Signs  Wt 164, Wt change -15 lb, Ht 62.5, BMI 29.51, BP sitting 120/80.   Physical Examination  GENERAL:  Patient appears alert and oriented.  General Appearance: well-appearing, well-developed, no acute distress.  Speech: clear.  LUNGS:  Auscultation: no wheezing/rhonchi/rales. CTA bilaterally.  HEART:  Heart sounds: normal. RRR. no murmur.  ABDOMEN:  General: soft nontender, nondistended, no masses.  FEMALE GENITOURINARY:  Cervix Surgically absent, cuff nontender.  Adnexa: no mass, non tender.  Uterus: surgically absent.  Vagina: pink/moist mucosa, no lesions, no abnormal discharge.  Vulva: normal, no lesions, no skin discoloration.  Anus: no external hemosrhoids.  EXTREMITIES:  general no edema.  General: No edema or calf tenderness.     Assessments   1. Pre-op exam - V72.84   2. Nausea and vomiting - 787.01   3. Ovarian cyst - 620.2   Treatment  1. Pre-op exam  Notes: R/B/A of procedure discussed with pt at length. All questions answered. Consent obtained.    2. Nausea and vomiting  Referral MO:[facility] - Gynecology Reason:Precert for Left oophrectomy, h/o n/v    3. Ovarian cyst  Notes: I am not convinced this simple cyst is the cause of the pt's  n/v but due to her h/o endometriosis will recommend removal. Discussed definitive management by removing entire ovary and fallopian tube which is preferred by pt so that we can rule this out as a cause of her symptoms. She is aware that removal of ovary can cause hot flashes, night sweats, vaginal dryness, mood changes, decreased libido. She will go on HRT as needed whidh is recommended to protect her bones. She is agreeable. If cyst is no longer present she would still like to have ovary removed.    Follow Up  2 Weeks post op

## 2013-12-05 NOTE — Anesthesia Preprocedure Evaluation (Addendum)
Anesthesia Evaluation  Patient identified by MRN, date of birth, ID band Patient awake    Reviewed: Allergy & Precautions, H&P , NPO status , Patient's Chart, lab work & pertinent test results  History of Anesthesia Complications Negative for: history of anesthetic complications  Airway Mallampati: II TM Distance: >3 FB Neck ROM: Full    Dental  (+) Teeth Intact, Dental Advisory Given   Pulmonary shortness of breath, sleep apnea and Continuous Positive Airway Pressure Ventilation ,    Pulmonary exam normal       Cardiovascular hypertension, Pt. on medications     Neuro/Psych  Headaches, PSYCHIATRIC DISORDERS Anxiety Depression Bipolar Disorder    GI/Hepatic Neg liver ROS, GERD-  Medicated,  Endo/Other  negative endocrine ROS  Renal/GU negative Renal ROS     Musculoskeletal   Abdominal   Peds  Hematology   Anesthesia Other Findings Chronic Opiate user/ fentanyl patch Last GA in 11/14 went well- no difficulty  Reproductive/Obstetrics                          Anesthesia Physical  Anesthesia Plan  ASA: II  Anesthesia Plan: General   Post-op Pain Management:    Induction: Intravenous  Airway Management Planned: Oral ETT  Additional Equipment:   Intra-op Plan:   Post-operative Plan: Extubation in OR  Informed Consent: I have reviewed the patients History and Physical, chart, labs and discussed the procedure including the risks, benefits and alternatives for the proposed anesthesia with the patient or authorized representative who has indicated his/her understanding and acceptance.   Dental advisory given  Plan Discussed with: CRNA, Anesthesiologist and Surgeon  Anesthesia Plan Comments:         Anesthesia Quick Evaluation                                  Anesthesia Evaluation  Patient identified by MRN, date of birth, ID band Patient awake    Reviewed: Allergy &  Precautions, H&P , NPO status , Patient's Chart, lab work & pertinent test results  History of Anesthesia Complications Negative for: history of anesthetic complications  Airway Mallampati: II TM Distance: >3 FB Neck ROM: Full    Dental  (+) Teeth Intact and Dental Advisory Given   Pulmonary shortness of breath, sleep apnea and Continuous Positive Airway Pressure Ventilation ,    Pulmonary exam normal       Cardiovascular hypertension, Pt. on medications     Neuro/Psych  Headaches, PSYCHIATRIC DISORDERS Anxiety Depression Bipolar Disorder    GI/Hepatic Neg liver ROS, GERD-  Medicated,  Endo/Other  negative endocrine ROS  Renal/GU negative Renal ROS     Musculoskeletal   Abdominal   Peds  Hematology   Anesthesia Other Findings   Reproductive/Obstetrics                          Anesthesia Physical Anesthesia Plan  ASA: II  Anesthesia Plan: General   Post-op Pain Management:    Induction: Intravenous  Airway Management Planned: Oral ETT  Additional Equipment:   Intra-op Plan:   Post-operative Plan: Extubation in OR  Informed Consent: I have reviewed the patients History and Physical, chart, labs and discussed the procedure including the risks, benefits and alternatives for the proposed anesthesia with the patient or authorized representative who has indicated his/her understanding and acceptance.   Dental advisory  given  Plan Discussed with: CRNA, Anesthesiologist and Surgeon  Anesthesia Plan Comments:        Anesthesia Quick Evaluation

## 2013-12-05 NOTE — Transfer of Care (Signed)
Immediate Anesthesia Transfer of Care Note  Patient: [name]  Procedure(s) Performed: Procedure(s): LAPAROSCOPIC SALPINGO OOPHORECTOMY (Left)  Patient Location: PACU  Anesthesia Type:General  Level of Consciousness: awake, alert  and oriented  Airway & Oxygen Therapy: Patient Spontanous Breathing and Patient connected to nasal cannula oxygen  Post-op Assessment: Report given to PACU RN  Post vital signs: Reviewed  Complications: No apparent anesthesia complications

## 2013-12-06 ENCOUNTER — Encounter (HOSPITAL_COMMUNITY): Payer: Self-pay | Admitting: Obstetrics and Gynecology

## 2013-12-06 NOTE — Op Note (Signed)
[name], [name]:  [id number]  MEDICAL RECORD NO.:  [id number]  LOCATION:  [facility]                          FACILITY:  [facility]  PHYSICIAN:  Jola Schmidt, MD   DATE OF BIRTH:  Oct 17, 1970  DATE OF PROCEDURE:  12/05/2013 DATE OF DISCHARGE:  12/05/2013                              OPERATIVE REPORT   PREOPERATIVE DIAGNOSES:  Left ovarian cyst, abdominal pain, intractable nausea and vomiting.  POSTOPERATIVE DIAGNOSES:  Left ovarian cyst, abdominal pain, intractable nausea and vomiting.  PROCEDURES:  Diagnostic laparoscopy, unilateral salpingo-oophorectomy, and peritoneal biopsies.  SURGEON:  Jola Schmidt, MD  ASSISTANT:  Dr. Erenest Blank and technician.  ANESTHESIA:  General.  IV FLUIDS IN:  1400 mL.  URINE OUTPUT:  100 mL of clear urine.  ESTIMATED BLOOD LOSS:  2 to 5, minimal.  DRAINS:  Foley catheter.  SPECIMENS:  Left ovary and tube.  Vaginal cuff biopsy and right pelvic side wall biopsy to Pathology.  DISPOSITION:  To PACU hemodynamically stable.  COMPLICATIONS:  None.  FINDINGS:  Normal abdominal cavity, hyperemic peritoneum visualized, no adhesions.  Intestines appeared to be hyperactive.  Left ovary with 2 cysts, simple in appearance, mobile and free.  No adhesions.  There were some nodularities at the vaginal cuff, and on the right hand side, where the ovary would be, it appeared that it looked like an ovarian remnant, and those areas were biopsied.  PROCEDURE IN DETAIL:  [name] was taken to the operating room with IV running after being identified in the holding area.  She was then placed in the dorsal supine position and underwent general endotracheal anesthesia without complication.  She was then placed in the dorsal supine position and prepped and draped in the normal sterile fashion.  Attention was turned to her abdomen.  I wanted to place a 10 trocar at the umbilicus, but the patient had previous laparoscopic  surgery and a cholecystectomy, and so I ended up placing the 5 bearing it in the umbilical folds.  Then, placed a 10 in the right lower quadrant and then another 5 in the right lower quadrant.  First, I injected the periumbilical incision site with Marcaine.  I then used a scalpel and made a 5-mm incision in the umbilical fold.  Under direct visualization, a 5-mm trocar was then inserted.  CO2 gas was then used to insufflate the abdomen.  The patient was placed in Trendelenburg.  Under direct visualization, a trocar was then placed in a similar fashion on the right lower quadrant.  This was a 10, again was pre-injected with Marcaine.  The same was done in the left lower quadrant with a 5-mm port.  Blunt probe was used to retract the intestines.  Intraabdominal cavity was assessed, everything appeared normal.  Attention was then turned to the left pelvic sidewall.  Ureters were found peristalsing well below the infundibulopelvic ligament.  The Harmonic was used then transect that, and the ovary and tube were removed in a normal fashion.  There was some bleeding noted there. Hemostasis was achieved with Harmonic and the Kleppinger.  I used a syringe with a suction irrigator once the cautery had been  performed, and there was no active bleeding noted.  Attention was then turned to the biopsies of the vaginal cuff and the peritoneal sidewall. Laparoscopic biopsy forceps were used.  There was one bite taken and a second bite when I attempted to pull off with a biopsy at lot and towards the peritoneum over the vaginal cuff.  There was some bleeding noted and that was cauterized with Kleppingers superficially.  Prior to the procedure beginning, 2 Ray-Tec sponges were inserted on a sponge stick into the vagina just for mobility and for anatomy.  When that biopsy was performed, it was right at the vaginal cuff that was not near the bladder.  The same was done on the right pelvic  sidewall. Biopsy was performed atraumatically.  No bleeding was noted on that side.  EndoCatch bag was then placed in the abdomen through the 10 mm port, and the ovary and tube were placed in the bag.  The specimen was large so it was brought up to the abdomen.  The fascial incision and skin incision were extended and that was removed.  Irrigation of the cul-de-sac was performed after that was removed.  The trocars were then removed under direct visualization.  Anesthesia performed some positive pressure to make sure we get all the air out prior to closure of all of the incisions.  The fascial incision on the right lower quadrant was grasped with Kocher clamps after being visualized with the [employer] retractor.  That was closed with 0-Vicryl in a running fashion.  The skin was then reapproximated with Monocryl in a subcuticular fashion on that particular site.  The left lower quadrant and umbilical port, there was just 1 subcuticular stitch that was placed with a 4-0 Monocryl. Dermabond was then applied.  All instruments and needle counts were correct x3.  The patient tolerated the procedure well.  She had SCDs on throughout the entire case, and she received Ancef prior to the procedure.  She tolerated the procedure well.     Jola Schmidt, MD     EBV/MEDQ  D:  12/05/2013  T:  12/05/2013  Job:  [id number]

## 2014-01-03 ENCOUNTER — Other Ambulatory Visit (HOSPITAL_COMMUNITY): Payer: Self-pay | Admitting: Gastroenterology

## 2014-01-03 DIAGNOSIS — R1110 Vomiting, unspecified: Secondary | ICD-10-CM

## 2014-01-09 NOTE — Op Note (Signed)
[name], [name] NO.:  [id number]  MEDICAL RECORD NO.:  [id number]  LOCATION:  [facility]                          FACILITY:  [facility]  PHYSICIAN:  Jola Schmidt, MD   DATE OF BIRTH:  1970-10-05  DATE OF PROCEDURE:  12/05/2013 DATE OF DISCHARGE:  12/05/2013                              OPERATIVE REPORT   ADDENDUM:  PROCEDURE:  Diagnostic laparoscopy with unilateral salpingo oophorectomy, peritoneal biopsies.  The purpose of the addendum is to clarify the role of Dr. Janyth Pupa, MD, who was the first assistant.  The patient has a history of endometriosis and severe abdominal pain.  Her presence was requested in anticipation of adhesive disease.  Dr. Nelda Marseille assisted with peritoneal biopsies and removal of the left fallopian tube and ovary.     Jola Schmidt, MD     EBV/MEDQ  D:  01/08/2014  T:  01/09/2014  Job:  [id number]

## 2014-01-14 ENCOUNTER — Ambulatory Visit (HOSPITAL_COMMUNITY)
Admission: RE | Admit: 2014-01-14 | Discharge: 2014-01-14 | Disposition: A | Discharge status: Home / Self Care | Payer: Federal, State, Local not specified - PPO | Source: Ambulatory Visit | Attending: Gastroenterology | Admitting: Gastroenterology

## 2014-01-14 ENCOUNTER — Encounter (HOSPITAL_COMMUNITY): Payer: Self-pay

## 2014-01-14 DIAGNOSIS — R109 Unspecified abdominal pain: Secondary | ICD-10-CM | POA: Insufficient documentation

## 2014-01-14 DIAGNOSIS — R1110 Vomiting, unspecified: Secondary | ICD-10-CM

## 2014-01-14 DIAGNOSIS — R1013 Epigastric pain: Secondary | ICD-10-CM

## 2014-01-14 DIAGNOSIS — K3189 Other diseases of stomach and duodenum: Secondary | ICD-10-CM | POA: Insufficient documentation

## 2014-01-14 DIAGNOSIS — R112 Nausea with vomiting, unspecified: Secondary | ICD-10-CM | POA: Insufficient documentation

## 2014-01-14 MED ORDER — TECHNETIUM TC 99M SULFUR COLLOID
2.1000 | Freq: Once | INTRAVENOUS | Status: AC | PRN
  Administered 2014-01-14: 2.1 via INTRAVENOUS

## 2014-02-07 ENCOUNTER — Encounter: Payer: Self-pay | Admitting: Neurology

## 2014-02-07 ENCOUNTER — Ambulatory Visit (INDEPENDENT_AMBULATORY_CARE_PROVIDER_SITE_OTHER): Payer: Federal, State, Local not specified - PPO | Admitting: Neurology

## 2014-02-07 VITALS — BP 129/86 | HR 70 | Resp 16 | Ht 62.75 in | Wt 156.0 lb

## 2014-02-07 DIAGNOSIS — M545 Low back pain, unspecified: Secondary | ICD-10-CM

## 2014-02-07 DIAGNOSIS — Z79899 Other long term (current) drug therapy: Secondary | ICD-10-CM

## 2014-02-07 DIAGNOSIS — G8929 Other chronic pain: Secondary | ICD-10-CM

## 2014-02-07 DIAGNOSIS — Z5181 Encounter for therapeutic drug level monitoring: Secondary | ICD-10-CM

## 2014-02-07 DIAGNOSIS — G4710 Hypersomnia, unspecified: Secondary | ICD-10-CM

## 2014-02-07 HISTORY — DX: Low back pain, unspecified: M54.50

## 2014-02-07 HISTORY — DX: Encounter for therapeutic drug level monitoring: Z51.81

## 2014-02-07 HISTORY — DX: Other chronic pain: G89.29

## 2014-02-07 MED ORDER — AMPHETAMINE-DEXTROAMPHETAMINE 20 MG PO TABS: 20.0000 mg | ORAL_TABLET | Freq: Two times a day (BID) | ORAL | Status: DC

## 2014-02-07 NOTE — Progress Notes (Addendum)
[facility]  Provider:  Larey Seat, M D  Referring Provider: Donnie Coffin, MD Primary Care Physician:  Donnie Coffin, MD  No chief complaint on file.   HPI:  [name] is a 43 y.o. afro [place] , right handed  female , who  Is seen here as a  evisit  from Dr. Alroy Dust for hypersomnia/  Narcolepsy.     Interval history : [name] just underwent a gallbladder surgery november 2014  under Dr Rush Farmer after losing 20 pounds with constant nausea, indigestion, abdominal cramping. Meanwhile  there was another exploratory surgery April 12th 2015  and was diagnosed with a cyst on the ovary and fallopian tube. The ovary,her last remaining was removed.  She has gastroparesis. Dr. Michail Sermon placed her on Reglan and Linsette. She was referred to [facility] for September 2015  She was placed on fentanyl patches for pain control. She started these after she started [facility] and now reports taking the fentanyl every 2 days instead of every 3 days. This is contraindicated for XYREM users.   [name] is here for her regular 6 month revisit for  her Xyrem therapy . She has responded very well she is much less excessive daytime sleepy she has much less frequent narcoleptic and cataplectic attacks.  As I have described in my past visit note , her MSLT documented a pathological degree of  hypersomnia but was lacking the REM onset naps, which  would diagnose narcolepsy.  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 Her response to Xyrem also was extremely preferable.   After her last visit I had wanted the patient to be tested by the HDL - narcolepsy factor,   Epworth 15 on meds, 20 without XYREM and FSS 63 points.   Based on her narcotic pain therapy needs, I doubt that her pain management physician sees any alternative to the current regimen.  I will not refill her Xyrem she takes 9 g daily divided into doses at night. I will change her to  Adderall 20 mg bid po.  I will  recheck her liver function tests.  She was just discharged from the hospital;  6 weeks ago -  .  RV in 6 month with NP Hassell Done for adderall refill, check up- Epworth and FSS, weight , and LFT. [place]    Review of Systems: Out of a complete 14 system review, the patient complains of only the following symptoms, and all other reviewed systems are negative. Again the patient is an unusual situation just having undergone abdominal surgery:  today is fatigue severity score was elevated in comparison to last 53 points. Epworth sleepiness score is endorsed at 13 points. On today's intake sheet the patient she has a recent change in her level of activity and her appetite, she has chills feels fatigued she had a weight change related to the abdominal problems with cholecystitis, excessive nighttime sweating, neck pain it a pain trouble swallowing, light sensitivity, shortness of breath, cold intolerance, swollen doorman abdominal pain abdominal cramping bleeding at the rectal nausea and vomiting joint pain joint swelling back pain muscle aches and cramps bladder incontinence environmental allergies, headaches, dizziness, weakness, concentration difficulties feeling depressed or anxious, the patient also endorsed sleep related to complaints of restless legs insomnia apnea frequent waking daytime sleepiness.  Past history :  The patient had originally had a polysomnography at [facility] , interpreted by Dr. Felecia Shelling.  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  The patient still had a high degree of sleepiness, endorsed the  Epworth score at 20 points , her BMI was 40.2 . Dr Felecia Shelling commended an MSLT to follow.   Dr. Elsworth Soho had seen her and treated her with Dexilant in 2012, the patient was than referred to our sleep lab for a reevaluation of the diagnosis of presumed  narcolepsy.  At the time the patient presented with an Epworth sleepiness score of 20 points. Her PSG showed an AHI of 5 and she was titrated to 6 cm water CPAP-  however  Just 90 days after her CPAP titration  and under CPAP compliance, her  Epworth score remained at 22 points, severely elevated.  She denied feeling any benefit from the CPAP while she was in was compliance ( documented  the downloads ) she reached up to 6 hours nightly sleep on CPAP  , but continued to take several naps each day.  She reports an ongoing  irresistible urge to sleep all day - she fell asleep during TV chills during conversations. Patient goes to bed at 11 PM and she rises about 5:30 AM to get her kids ready for school. She has 3 young children, she has not had a shift work history. The patient states that her sleep time of approximately 6 hours at night is not refreshing her, she is sometimes has very vivid dreams , she does not have nocturia Her husband had told her that she snores as well as her children and the patient has mild retrognathia. She felt that she never gets enough sleep.   She reported having sleep hallucinations and dream inclusions as well as sleep paralysis.  The patient repeated a polysomnography on CPAP , the  AHI was 1.1 her RDI 4.2 and she had minor PLM related arousals of 1.9 per hour. And MSLT followed and the patient had a mean sleep latency of only 3.6 minutes, no REM sleep onsets were noted.  This degree of hypersomnia was definitely pathologic and in the proper clinical scenario consistent with the diagnosis of narcolepsy.  She also reported cataplectic events- and was started on XYREM.   In May 2013 her Epworth score was still 21 in spite of being on 2 doses of Xyrem nightly . It turned out that the patient had not reached the 4.5 g recommended doses by July of the same year.  She presented with some weight loss and still had the Epworth score endorsed at 20 points , showed the residual  AHI of 2.1,  the RDI of 3.0 . She definitely was not sleepy due to to apnea or upper airway resistancy syndrome. By November 2013 was placed on Adderall in addition to the Xyrem, the dose of Xyrem gradually increased - her Epworth sleepiness score was still  16 still sub-optimal.  PS :  Mirapex was added for RLS that she felt hindered her sleep initiation, but  she felt no improvement.  Dr. Baltazar Najjar at [facility] , her regular Neurologist treats her for that , and he added Neurontin which she could not tolerate. The Baltazar Najjar also treat the patient's migraines with Botox -injections.       History   Social History  . Marital Status: Married    Spouse Name: N/A    Number of Children: 3  . Years of Education: BS   Occupational History  . unemployed   .  Social History Main Topics  . Smoking status: Never Smoker   . Smokeless tobacco: Never Used  . Alcohol Use: No  . Drug Use: No  . Sexual Activity: Yes    Birth Control/ Protection: Surgical   Other Topics Concern  . Not on file   Social History Narrative    This patient has an RDI of 12 and AHI of 4.8,  titrated to 6 cm water after PSG, she did qualify for MSLT, Epworth 22 today on 6 cm water.      Based on her MSLT  of 3.6 minutes without SREM and Epworth of 22 on CPAP with residual AHI of 1.8, she should be treated  in a trial base for narcolepsy. She is on many REM supressant medications, and may not be able to  express the narcolepsy in an MSLT.   I would like for the patient to undergo an HLA antibody test to look further for the classification of her narcolepsy disorder.  Family History  Problem Relation Age of Onset  . Cancer Mother     breast  . Cancer Father     prostate,colon  . Hypertension Father   . Thyroid disease Father   . High Cholesterol Father   . Thyroid disease Sister   . Cancer Paternal Aunt     colon  . Cancer Maternal Grandmother     kidney  . Cancer Paternal Grandmother     colon  . Cancer  Paternal Aunt     lung  . Narcolepsy Neg Hx     Past Medical History  Diagnosis Date  . Hypertension   . Overactive bladder   . Esophageal reflux   . Migraine   . Psychosis   . Schizophrenia, schizo-affective   . OCD (obsessive compulsive disorder)   . Bipolar 1 disorder   . Restless leg syndrome   . S/P lumbar fusion 2011  . Torn rotator cuff it:2012,rt:2013  . Migraines   . Depression     excessive daytime sleepiness-MSLT confirmed pathological degree of hypersomnia-12/13  . Anxiety   . Narcolepsy   . Migraine   . Hypersomnia   . Complication of anesthesia     reports gets cold and itch  . Asthma   . Sleep apnea     uses CPAP every night  . Seasonal allergies   . Schizophrenia   . Osteoarthritis     knees, hands, shoulder, lower back    Past Surgical History  Procedure Laterality Date  . Vesicovaginal fistula closure w/ tah  2001  . Tubal ligation  2004  . Shoulder surgery      left and right , bone spurs, torn muscle  . Abdominal hysterectomy  2001  . Spurs,ruptured disc bone  2009  . Lumbar fusion  2011    L4-L5  . Rotator cuff repair  2012,2013    torn   . Oophorectomy   2004    right  . Ankle fusion      right  . Cholecystectomy N/A 07/20/2013    Procedure: LAPAROSCOPIC CHOLECYSTECTOMY;  Surgeon: Harl Bowie, MD;  Location: [place];  Service: General;  Laterality: N/A;  . Colonoscopy with propofol N/A 10/02/2013    Procedure: COLONOSCOPY WITH PROPOFOL;  Surgeon: Garlan Fair, MD;  Location: [facility];  Service: Endoscopy;  Laterality: N/A;  . Esophagogastroduodenoscopy (egd) with propofol N/A 10/02/2013    Procedure: ESOPHAGOGASTRODUODENOSCOPY (EGD) WITH PROPOFOL;  Surgeon: Garlan Fair, MD;  Location: [facility];  Service: Endoscopy;  Laterality: N/A;  . Wisdom tooth extraction    . Laparoscopic salpingo oopherectomy Left 12/05/2013    Procedure: LAPAROSCOPIC SALPINGO OOPHORECTOMY;  Surgeon: Geryl Rankins, MD;  Location: [facility];   Service: Gynecology;  Laterality: Left;    Current Outpatient Prescriptions  Medication Sig Dispense Refill  . AMITIZA 24 MCG capsule Take 24 mcg by mouth daily with breakfast.       . amphetamine-dextroamphetamine (ADDERALL) 20 MG tablet Take 20 mg by mouth daily with lunch.      . budesonide-formoterol (SYMBICORT) 160-4.5 MCG/ACT inhaler Inhale 2 puffs into the lungs 2 (two) times daily.        . celecoxib (CELEBREX) 200 MG capsule Take 200 mg by mouth daily.       . clobetasol (TEMOVATE) 0.05 % cream Apply 1 application topically 2 (two) times daily.       . Clobetasol Propionate (CLOBEX) 0.05 % shampoo Apply 1 application topically once a week. wednesday      . clotrimazole-betamethasone (LOTRISONE) lotion Apply 1 application topically daily as needed (for rash).       . cycloSPORINE (RESTASIS) 0.05 % ophthalmic emulsion Place 1 drop into both eyes every 12 (twelve) hours.        Marland Kitchen estrogens, conjugated, (PREMARIN) 0.625 MG tablet Take 1 tablet (0.625 mg total) by mouth daily. Take daily for 21 days then do not take for 7 days.  30 tablet  2  . EVAMIST 1.53 MG/SPRAY transdermal spray       . fentaNYL (DURAGESIC - DOSED MCG/HR) 75 MCG/HR Place 75 mcg onto the skin every other day.      . fesoterodine (TOVIAZ) 8 MG TB24 tablet Take 8 mg by mouth daily.      . fluconazole (DIFLUCAN) 150 MG tablet       . fluocinolone (DERMA-SMOOTHE/FS BODY) 0.01 % external oil Apply 1 application topically 3 (three) times daily.       . fluocinonide (LIDEX) 0.05 % cream Apply 1 application topically 3 (three) times a week.       . gabapentin (NEURONTIN) 300 MG capsule Take 300 mg by mouth 3 (three) times daily.       . hydrochlorothiazide (HYDRODIURIL) 25 MG tablet Take 25 mg by mouth every morning.       Marland Kitchen HYDROmorphone (DILAUDID) 2 MG tablet Take 1 tablet (2 mg total) by mouth every 4 (four) hours as needed for moderate pain or severe pain.  30 tablet  0  . HYDROmorphone (DILAUDID) 2 MG tablet Take 1 tablet  (2 mg total) by mouth every 2 (two) hours as needed for severe pain.  30 tablet  0  . HYPERCARE 20 % external solution Apply 1 application topically daily as needed (to affected area).       . loteprednol (LOTEMAX) 0.2 % SUSP Place 1 drop into both eyes daily.      Marland Kitchen lurasidone (LATUDA) 40 MG TABS Take 40 mg by mouth at bedtime.       . metoCLOPramide (REGLAN) 5 MG tablet       . Olopatadine HCl (PATADAY) 0.2 % SOLN Place 1 drop into both eyes 2 (two) times daily as needed (for allergies).       . OnabotulinumtoxinA (BOTOX IJ) Inject as directed every 3 (three) months.      . pantoprazole (PROTONIX) 20 MG tablet Take 20 mg by mouth daily.      . polyethylene glycol (MIRALAX / GLYCOLAX) packet Take 17 g  by mouth daily as needed for moderate constipation.       . pramipexole (MIRAPEX) 0.5 MG tablet Take 0.5 mg by mouth at bedtime.       . RELPAX 40 MG tablet       . Sodium Oxybate 500 MG/ML SOLN Take 9 mLs by mouth at bedtime.      Marland Kitchen tiZANidine (ZANAFLEX) 4 MG tablet Take 4 mg by mouth 2 (two) times daily.       Marland Kitchen topiramate (TOPAMAX) 100 MG tablet Take 100-200 mg by mouth 2 (two) times daily. 1 tab every AM and 2 at night      . triamcinolone cream (KENALOG) 0.1 % Apply 1 application topically daily as needed (to affected area).       . verapamil (VERELAN PM) 240 MG 24 hr capsule Take 240 mg by mouth at bedtime.         No current facility-administered medications for this visit.    Allergies as of 02/07/2014 - Review Complete 01/14/2014  Allergen Reaction Noted  . Zofran [ondansetron] Nausea And Vomiting 09/21/2013  . Adhesive [tape] Rash 08/09/2013  . Aspirin Nausea Only 06/03/2011  . Benadryl [diphenhydramine hcl] Other (See Comments) 06/09/2011  . Codeine Nausea And Vomiting 10/11/2011  . Dust mite extract Other (See Comments) 01/18/2013  . Mold extract [trichophyton] Other (See Comments) 01/18/2013  . Pollen extract Other (See Comments) 01/18/2013  . Prednisone Other (See Comments)  06/09/2011    Vitals: There were no vitals taken for this visit. Last Weight:  Wt Readings from Last 1 Encounters:  11/28/13 163 lb (73.936 kg)   Last Height:   Ht Readings from Last 1 Encounters:  11/28/13 $RemoveB'5\' 2"'[id number]$  (1.575 m)    Physical exam:  General: The patient is awake, alert and appears not in acute distress. The patient is well groomed. Head: Normocephalic, atraumatic. Neck is supple. Mallampati 2  neck circumference:15.5 inches , lost 12 pounds since Dec. 2013  Cardiovascular:  Regular rate and rhythm, without  murmurs or carotid bruit, and without distended neck veins. Respiratory: Lungs are clear to auscultation. Skin:  Without evidence of edema, or rash Trunk: BMI is still  elevated and patient  has normal posture.  Neurologic exam : The patient is awake and alert, oriented to place and time.  Memory subjective  described as intact. There is a normal attention span & concentration ability. Speech is fluent without  dysarthria, dysphonia or aphasia. Mood and affect are appropriate.  Cranial nerves: Pupils are equal and briskly reactive to light. Funduscopic exam without  evidence of pallor or edema. Extraocular movements  in vertical and horizontal planes intact and without nystagmus. Visual fields by finger perimetry are intact. Hearing to finger rub intact.  Facial sensation intact to fine touch. Facial motor strength is symmetric and tongue and uvula move midline.  Motor exam:  Normal tone and normal muscle bulk and symmetric normal strength in all extremities.  Sensory:  Fine touch, pinprick and vibration were tested in all extremities. Proprioception is tested in the upper extremities only. This was  normal.  Coordination: Rapid alternating movements in the fingers/hands is tested and normal. Finger-to-nose maneuver tested and normal without evidence of ataxia, dysmetria or tremor.  Gait and station: Patient walks without assistive device and is able and assisted stool  climb up to the exam table. Strength within normal limits. Stance is stable and normal.  Romberg testing is negative .  Deep tendon reflexes: in the  upper and lower  extremities are symmetric and intact. Babinski maneuver response is downgoing.   Assessment:  After physical and neurologic examination, review of laboratory studies, imaging, neurophysiology testing and pre-existing records, assessment will be reviewed on the problem list.   Patient will continue on Xyrem as well as on her CPAP machine.  I am optimistic that further weight loss may eliminate the need for CPAP in the near future. She has lost 20 pounds , she will need to stay at this weight for the next 6 month to make an adjustment in pressure.  Xyrem will be refilled today.  I will obtain liver function tests.  I will share these with her referring physician Dr. Sheralyn Boatman.  Plan:  Treatment plan and discussion for narcolepsy - HLA test ordered in December was negative  .patient was called with results.   Adderall refilled ,  XYREM  Weaning. adderall bid 20 mg now.

## 2014-02-07 NOTE — Patient Instructions (Signed)
Hypersomnia Hypersomnia usually brings recurrent episodes of excessive daytime sleepiness or prolonged nighttime sleep. It is different than feeling tired due to lack of or interrupted sleep at night. People with hypersomnia are compelled to nap repeatedly during the day. This is often at inappropriate times such as:  At work.  During a meal.  In conversation. These daytime naps usually provide no relief. This disorder typically affects adolescents and young adults. CAUSES  This condition may be caused by:  Another sleep disorder (such as narcolepsy or sleep apnea).  Dysfunction of the autonomic nervous system.  Drug or alcohol abuse.  A physical problem, such as:  A tumor.  Head trauma. This is damage caused by an accident.  Injury to the central nervous system.  Certain medications, or medicine withdrawal.  Medical conditions may contribute to the disorder, including:  Multiple sclerosis.  Depression.  Encephalitis.  Epilepsy.  Obesity.  Some people appear to have a genetic predisposition to this disorder. In others, there is no known cause. SYMPTOMS   Patients often have difficulty waking from a long sleep. They may feel dazed or confused.  Other symptoms may include:  Anxiety.  Increased irritation (inflammation).  Decreased energy.  Restlessness.  Slow thinking.  Slow speech.  Loss of appetite.  Hallucinations.  Memory difficulty.  Tremors, Tics.  Some patients lose the ability to function in family, social, occupational, or other settings. TREATMENT  Treatment is symptomatic in nature. Stimulants and other drugs may be used to treat this disorder. Changes in behavior may help. For example, avoid night work and social activities that delay bed time. Changes in diet may offer some relief. Patients should avoid alcohol and caffeine. PROGNOSIS  The likely outcome (prognosis) for persons with hypersomnia depends on the cause of the disorder.  The disorder itself is not life threatening. But it can have serious consequences. For example, automobile accidents can be caused by falling asleep while driving. The attacks usually continue indefinitely. Document Released: 08/13/2002 Document Revised: 11/15/2011 Document Reviewed: 07/17/2008 [facility] Patient Information 2014 [place], [place].   Patient will wean off Xyrem by using 4.5 gram for the first and only dose at night and d/c after this refill is emptied.  She has fentanyl patches for pain control , that can not be used with XYREM.  She will use 20 mg bid Adderall for wakefulness.

## 2014-02-08 LAB — COMPREHENSIVE METABOLIC PANEL
A/G RATIO: 1.6 (ref 1.1–2.5)
ALBUMIN: 4.6 g/dL (ref 3.5–5.5)
ALK PHOS: 60 IU/L (ref 39–117)
ALT: 17 IU/L (ref 0–32)
AST: 20 IU/L (ref 0–40)
BUN/Creatinine Ratio: 14 (ref 9–23)
BUN: 11 mg/dL (ref 6–24)
CO2: 26 mmol/L (ref 18–29)
Calcium: 9.5 mg/dL (ref 8.7–10.2)
Chloride: 102 mmol/L (ref 97–108)
Creatinine, Ser: 0.81 mg/dL (ref 0.57–1.00)
GFR calc Af Amer: 102 mL/min/{1.73_m2} (ref >59)
GFR, EST NON AFRICAN AMERICAN: 89 mL/min/{1.73_m2} (ref >59)
GLOBULIN, TOTAL: 2.8 g/dL (ref 1.5–4.5)
Glucose: 78 mg/dL (ref 65–99)
Potassium: 4.8 mmol/L (ref 3.5–5.2)
SODIUM: 142 mmol/L (ref 134–144)
Total Bilirubin: 0.3 mg/dL (ref 0.0–1.2)
Total Protein: 7.4 g/dL (ref 6.0–8.5)

## 2014-02-08 LAB — CBC WITH DIFFERENTIAL/PLATELET
Basophils Absolute: 0 10*3/uL (ref 0.0–0.2)
Basos: 0 %
EOS ABS: 0.1 10*3/uL (ref 0.0–0.4)
Eos: 1 %
HCT: 39.3 % (ref 34.0–46.6)
HEMOGLOBIN: 13.4 g/dL (ref 11.1–15.9)
Immature Grans (Abs): 0 10*3/uL (ref 0.0–0.1)
Immature Granulocytes: 0 %
Lymphocytes Absolute: 2.1 10*3/uL (ref 0.7–3.1)
Lymphs: 34 %
MCH: 28.1 pg (ref 26.6–33.0)
MCHC: 34.1 g/dL (ref 31.5–35.7)
MCV: 82 fL (ref 79–97)
Monocytes Absolute: 0.3 10*3/uL (ref 0.1–0.9)
Monocytes: 5 %
NEUTROS ABS: 3.7 10*3/uL (ref 1.4–7.0)
NEUTROS PCT: 60 %
RBC: 4.77 x10E6/uL (ref 3.77–5.28)
RDW: 14.7 % (ref 12.3–15.4)
WBC: 6.1 10*3/uL (ref 3.4–10.8)

## 2014-02-12 NOTE — Progress Notes (Signed)
Quick Note:  Shared normal labs with patient per Dr Brett Fairy, she verbalized understanding, wanted to find out about a prescription prior approval for adderall that her pharmacy ([facility])had sent over, have not heard anything ______

## 2014-02-21 ENCOUNTER — Telehealth: Payer: Self-pay

## 2014-02-21 NOTE — Telephone Encounter (Signed)
[employer] FEP sent [place] a letter saying they have approved our request for coverage on Adderall 20mg  twice daily effective until 02/08/2015 Ref Member # [id number].

## 2014-05-28 ENCOUNTER — Other Ambulatory Visit: Payer: Self-pay

## 2014-05-28 DIAGNOSIS — Z1231 Encounter for screening mammogram for malignant neoplasm of breast: Secondary | ICD-10-CM

## 2014-05-31 ENCOUNTER — Ambulatory Visit: Payer: Federal, State, Local not specified - PPO

## 2014-06-07 ENCOUNTER — Ambulatory Visit
Admission: RE | Admit: 2014-06-07 | Discharge: 2014-06-07 | Disposition: A | Discharge status: Home / Self Care | Payer: Federal, State, Local not specified - PPO | Source: Ambulatory Visit

## 2014-06-07 DIAGNOSIS — Z1231 Encounter for screening mammogram for malignant neoplasm of breast: Secondary | ICD-10-CM

## 2014-08-07 ENCOUNTER — Ambulatory Visit (INDEPENDENT_AMBULATORY_CARE_PROVIDER_SITE_OTHER): Payer: Federal, State, Local not specified - PPO | Admitting: Adult Health

## 2014-08-07 ENCOUNTER — Encounter: Payer: Self-pay | Admitting: Adult Health

## 2014-08-07 VITALS — BP 120/77 | HR 84 | Temp 99.0°F | Ht 62.0 in | Wt 164.0 lb

## 2014-08-07 DIAGNOSIS — G47411 Narcolepsy with cataplexy: Secondary | ICD-10-CM

## 2014-08-07 DIAGNOSIS — Z5181 Encounter for therapeutic drug level monitoring: Secondary | ICD-10-CM

## 2014-08-07 MED ORDER — AMPHETAMINE-DEXTROAMPHET ER 20 MG PO CP24: 20.0000 mg | ORAL_CAPSULE | Freq: Every day | ORAL | Status: DC

## 2014-08-07 NOTE — Patient Instructions (Signed)
Amphetamine; Dextroamphetamine extended-release capsules What is this medicine? AMPHETAMINE; DEXTROAMPHETAMINE (am FET a meen; dex troe am FET a meen) is used to treat attention-deficit hyperactivity disorder (ADHD). Federal law prohibits giving this medicine to any person other than the person for whom it was prescribed. Do not share this medicine with anyone else. This medicine may be used for other purposes; ask your health care provider or pharmacist if you have questions. COMMON BRAND NAME(S): Adderall XR What should I tell my health care provider before I take this medicine? They need to know if you have any of these conditions: -anxiety or panic attacks -circulation problems in fingers and toes -glaucoma -hardening or blockages of the arteries or heart blood vessels -heart disease or a heart defect -high blood pressure -history of a drug or alcohol abuse problem -history of stroke -kidney disease -liver disease -mental illness -seizures -suicidal thoughts, plans, or attempt; a previous suicide attempt by you or a family member -thyroid disease -Tourette's syndrome -an unusual or allergic reaction to dextroamphetamine, other amphetamines, other medicines, foods, dyes, or preservatives -pregnant or trying to get pregnant -breast-feeding How should I use this medicine? Take this medicine by mouth with a glass of water. Follow the directions on the prescription label. This medicine is taken just one time per day, usually in the morning after waking up. Take with or without food. Do not chew or crush this medicine. You may open the capsules and sprinkle the medicine onto a spoon of applesauce. If sprinkled on applesauce, take the dose immediately and do not crush or chew. Always drink a glass of water or other liquid after taking this medicine. Do not take your medicine more often than directed. A special MedGuide will be given to you by the pharmacist with each prescription and refill.  Be sure to read this information carefully each time. Talk to your pediatrician regarding the use of this medicine in children. While this drug may be prescribed for children as young as 6 years for selected conditions, precautions do apply. Overdosage: If you think you have taken too much of this medicine contact a poison control center or emergency room at once. NOTE: This medicine is only for you. Do not share this medicine with others. What if I miss a dose? If you miss a dose, take it as soon as you can. If it is almost time for your next dose, take only that dose. Do not take double or extra doses. What may interact with this medicine? Do not take this medicine with any of the following medications: -certain medicines for migraine headache like almotriptan, eletriptan, frovatriptan, naratriptan, rizatriptan, sumatriptan, zolmitriptan -MAOIs like Carbex, Eldepryl, Marplan, Nardil, and Parnate -meperidine -other stimulant medicines for attention disorders, weight loss, or to stay awake -pimozide -procarbazine This medicine may also interact with the following medications: -acetazolamide -ammonium chloride -antacids -ascorbic acid -atomoxetine -caffeine -certain medicines for blood pressure -certain medicines for depression, anxiety, or psychotic disturbances -certain medicines for diabetes -certain medicines for seizures like carbamazepine, phenobarbital, phenytoin -certain medicines for stomach problems like cimetidine, famotidine, omeprazole, lansoprazole -cold or allergy medicines -glutamic acid -methenamine -narcotic medicines for pain -norepinephrine -phenothiazines like chlorpromazine, mesoridazine, prochlorperazine, thioridazine -sodium acid phosphate -sodium bicarbonate This list may not describe all possible interactions. Give your health care provider a list of all the medicines, herbs, non-prescription drugs, or dietary supplements you use. Also tell them if you  smoke, drink alcohol, or use illegal drugs. Some items may interact with your medicine. What should   I watch for while using this medicine? Visit your doctor or health care professional for regular checks on your progress. This prescription requires that you follow special procedures with your doctor and pharmacy. You will need to have a new written prescription from your doctor every time you need a refill. This medicine may affect your concentration, or hide signs of tiredness. Until you know how this medicine affects you, do not drive, ride a bicycle, use machinery, or do anything that needs mental alertness. Tell your doctor or health care professional if this medicine loses its effects, or if you feel you need to take more than the prescribed amount. Do not change the dosage without talking to your doctor or health care professional. Decreased appetite is a common side effect when starting this medicine. Eating small, frequent meals or snacks can help. Talk to your doctor if you continue to have poor eating habits. Height and weight growth of a child taking this medicine will be monitored closely. Do not take this medicine close to bedtime. It may prevent you from sleeping. If you are going to need surgery, an MRI, a CT scan, or other procedure, tell your doctor that you are taking this medicine. You may need to stop taking this medicine before the procedure. Tell your doctor or healthcare professional right away if you notice unexplained wounds on your fingers and toes while taking this medicine. You should also tell your healthcare provider if you experience numbness or pain, changes in the skin color, or sensitivity to temperature in your fingers or toes. What side effects may I notice from receiving this medicine? Side effects that you should report to your doctor or health care professional as soon as possible: -allergic reactions like skin rash, itching or hives, swelling of the face, lips, or  tongue -changes in vision -chest pain or chest tightness -confusion, trouble speaking or understanding -fast, irregular heartbeat -fingers or toes feel numb, cool, painful -hallucination, loss of contact with reality -high blood pressure -males: prolonged or painful erection -seizures -severe headaches -shortness of breath -suicidal thoughts or other mood changes -trouble walking, dizziness, loss of balance or coordination -uncontrollable head, mouth, neck, arm, or leg movements Side effects that usually do not require medical attention (report to your doctor or health care professional if they continue or are bothersome): -anxious -headache -loss of appetite -nausea, vomiting -trouble sleeping -weight loss This list may not describe all possible side effects. Call your doctor for medical advice about side effects. You may report side effects to FDA at 1-800-FDA-1088. Where should I keep my medicine? Keep out of the reach of children. This medicine can be abused. Keep your medicine in a safe place to protect it from theft. Do not share this medicine with anyone. Selling or giving away this medicine is dangerous and against the law. Store at room temperature between 15 and 30 degrees C (59 and 86 degrees F). Keep container tightly closed. Protect from light. Throw away any unused medicine after the expiration date. NOTE: This sheet is a summary. It may not cover all possible information. If you have questions about this medicine, talk to your doctor, pharmacist, or health care provider.  2015, Elsevier/Gold Standard. (2013-07-06 18:16:00)  

## 2014-08-07 NOTE — Progress Notes (Signed)
I agree with the assessment and plan as directed by NP .The patient is known to me .   Courtney Matty, MD  

## 2014-08-07 NOTE — Progress Notes (Signed)
PATIENT: [name] DOB: 10-Jan-1971  REASON FOR VISIT: follow up HISTORY FROM: patient  HISTORY OF PRESENT ILLNESS: [name] is a 43 year old female with a  history of narcolepsy. She returns today for follow-up. She was prescribed  Adderall 20 mg BID. She was taken off Xyrem because she was placed on fentanyl patches for pain control. She reports that her daytime sleepiness has not improved with Adderall.  She has been only taking 1 Adderall 20 mg around mid day ( she thought that was how it was ordered). She states that the Adderall helps for several hours after taking it but then it wears off. She states that she is spending more time in the bed rather than up and doing things. She is also not getting restorative sleep which she feels is affecting her fatigue level.  Her  Epworth is 18 Fatigue severity score is 63.   HISTORY 02/07/14 [facility]): [name] just underwent a gallbladder surgery november 2014  under Dr Rush Farmer after losing 20 pounds with constant nausea, indigestion, abdominal cramping. Meanwhile  there was another exploratory surgery April 12th 2015  and was diagnosed with a cyst on the ovary and fallopian tube. The ovary,her last remaining was removed.  She has gastroparesis. Dr. Michail Sermon placed her on Reglan and Linsette. She was referred to [facility] for September 2015 She was placed on fentanyl patches for pain control. She started these after she started [facility] and now reports taking the fentanyl every 2 days instead of every 3 days. This is contraindicated for XYREM users.  [name] is here for her regular 6 month revisit for  her Xyrem therapy .She has responded very well she is much less excessive daytime sleepy she has much less frequent narcoleptic and cataplectic attacks. As I have described in my past visit note , her MSLT documented a pathological degree of  hypersomnia but was lacking the REM onset naps, which  would diagnose narcolepsy. 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 Her response to Xyrem also was extremely preferable.After her last visit I had wanted the patient to be tested by the HDL - narcolepsy factor Epworth 15 on meds, 20 without XYREM and FSS 63 points. Based on her narcotic pain therapy needs, I doubt that her pain management physician sees any alternative to the current regimen. I will not refill her Xyrem she takes 9 g daily divided into doses at night. I will change her to Adderall 20 mg bid po. I will  recheck her liver function tests. She was just discharged from the hospital;  6 weeks ago -  RV in 6 month with NP Hassell Done for adderall refill, check up- Epworth and FSS, weight , and LFT. Cc [employer]   REVIEW OF SYSTEMS: Out of a complete 14 system review of symptoms, the patient complains only of the following symptoms, and all other reviewed systems are negative.  ALLERGIES: Allergies  Allergen Reactions  . Zofran [Ondansetron] Nausea And Vomiting  . Adhesive [Tape] Rash    Glue from steri strips  . Aspirin Nausea Only  . Benadryl [Diphenhydramine Hcl] Other (See Comments)    Makes her hyper, jittery  . Codeine Nausea And Vomiting  . Dust Mite Extract Other (See Comments)    Cough, sneeze, eyes runny  . Mold Extract [Trichophyton] Other (See Comments)    Cough, sneeze, eyes/nose runny  . Pollen Extract Other (See Comments)  Cough, sneeze, eyes/nose runny  . Prednisone Other (See Comments)    Makes her hyper, jittery     HOME MEDICATIONS: Outpatient Prescriptions Prior to Visit  Medication Sig Dispense Refill  . AMITIZA 24 MCG capsule Take 24 mcg by mouth daily with breakfast.     . amphetamine-dextroamphetamine (ADDERALL) 20 MG tablet Take 1 tablet (20 mg total) by mouth 2 (two) times daily. 60 tablet 0  . budesonide-formoterol (SYMBICORT) 160-4.5 MCG/ACT inhaler Inhale 2 puffs into the lungs 2 (two) times daily.      . celecoxib (CELEBREX) 200 MG capsule Take 200 mg by  mouth daily.     . clobetasol (TEMOVATE) 0.05 % cream Apply 1 application topically 2 (two) times daily.     . Clobetasol Propionate (CLOBEX) 0.05 % shampoo Apply 1 application topically once a week. wednesday    . clotrimazole-betamethasone (LOTRISONE) lotion Apply 1 application topically daily as needed (for rash).     . cycloSPORINE (RESTASIS) 0.05 % ophthalmic emulsion Place 1 drop into both eyes every 12 (twelve) hours.      Marland Kitchen estrogens, conjugated, (PREMARIN) 0.625 MG tablet Take 1 tablet (0.625 mg total) by mouth daily. Take daily for 21 days then do not take for 7 days. 30 tablet 2  . EVAMIST 1.53 MG/SPRAY transdermal spray     . fentaNYL (DURAGESIC - DOSED MCG/HR) 75 MCG/HR Place 75 mcg onto the skin every other day.    . fesoterodine (TOVIAZ) 8 MG TB24 tablet Take 8 mg by mouth daily.    . fluconazole (DIFLUCAN) 150 MG tablet     . fluocinolone (DERMA-SMOOTHE/FS BODY) 0.01 % external oil Apply 1 application topically 3 (three) times daily.     . fluocinonide (LIDEX) 0.05 % cream Apply 1 application topically 3 (three) times a week.     . gabapentin (NEURONTIN) 300 MG capsule Take 300 mg by mouth 3 (three) times daily.     . hydrochlorothiazide (HYDRODIURIL) 25 MG tablet Take 25 mg by mouth every morning.     Marland Kitchen HYPERCARE 20 % external solution Apply 1 application topically daily as needed (to affected area).     . Linaclotide (LINZESS) 145 MCG CAPS capsule Take 145 mcg by mouth daily.    Marland Kitchen loteprednol (LOTEMAX) 0.2 % SUSP Place 1 drop into both eyes daily.    Marland Kitchen lurasidone (LATUDA) 40 MG TABS Take 40 mg by mouth at bedtime.     . Olopatadine HCl (PATADAY) 0.2 % SOLN Place 1 drop into both eyes 2 (two) times daily as needed (for allergies).     . OnabotulinumtoxinA (BOTOX IJ) Inject as directed every 3 (three) months.    . pantoprazole (PROTONIX) 20 MG tablet Take 20 mg by mouth daily.    . polyethylene glycol (MIRALAX / GLYCOLAX) packet Take 17 g by mouth daily as needed for moderate  constipation.     . pramipexole (MIRAPEX) 0.5 MG tablet Take 0.5 mg by mouth at bedtime.     . RELPAX 40 MG tablet     . tiZANidine (ZANAFLEX) 4 MG tablet Take 4 mg by mouth 2 (two) times daily.     Marland Kitchen topiramate (TOPAMAX) 100 MG tablet Take 100-200 mg by mouth 2 (two) times daily. 1 tab every AM and 2 at night    . triamcinolone cream (KENALOG) 0.1 % Apply 1 application topically daily as needed (to affected area).     . verapamil (VERELAN PM) 240 MG 24 hr capsule Take 240 mg by mouth  at bedtime.       No facility-administered medications prior to visit.    PAST MEDICAL HISTORY: Past Medical History  Diagnosis Date  . Hypertension   . Overactive bladder   . Esophageal reflux   . Migraine   . Psychosis   . Schizophrenia, schizo-affective   . OCD (obsessive compulsive disorder)   . Bipolar 1 disorder   . Restless leg syndrome   . S/P lumbar fusion 2011  . Torn rotator cuff it:2012,rt:2013  . Migraines   . Depression     excessive daytime sleepiness-MSLT confirmed pathological degree of hypersomnia-12/13  . Anxiety   . Narcolepsy   . Migraine   . Hypersomnia   . Asthma   . Sleep apnea     uses CPAP every night  . Seasonal allergies   . Schizophrenia   . Osteoarthritis     knees, hands, shoulder, lower back  . Chronic lower back pain 02/07/2014  . Medication monitoring encounter 02/07/2014    PAST SURGICAL HISTORY: Past Surgical History  Procedure Laterality Date  . Vesicovaginal fistula closure w/ tah  2001  . Tubal ligation  2004  . Shoulder surgery      left and right , bone spurs, torn muscle  . Abdominal hysterectomy  2001  . Spurs,ruptured disc bone  2009  . Lumbar fusion  2011    L4-L5  . Rotator cuff repair  2012,2013    torn   . Oophorectomy   2004    right  . Ankle fusion      right  . Cholecystectomy N/A 07/20/2013    Procedure: LAPAROSCOPIC CHOLECYSTECTOMY;  Surgeon: Harl Bowie, MD;  Location: [facility];  Service: General;  Laterality: N/A;  .  Colonoscopy with propofol N/A 10/02/2013    Procedure: COLONOSCOPY WITH PROPOFOL;  Surgeon: Garlan Fair, MD;  Location: [facility];  Service: Endoscopy;  Laterality: N/A;  . Esophagogastroduodenoscopy (egd) with propofol N/A 10/02/2013    Procedure: ESOPHAGOGASTRODUODENOSCOPY (EGD) WITH PROPOFOL;  Surgeon: Garlan Fair, MD;  Location: [facility];  Service: Endoscopy;  Laterality: N/A;  . Wisdom tooth extraction    . Laparoscopic salpingo oopherectomy Left 12/05/2013    Procedure: LAPAROSCOPIC SALPINGO OOPHORECTOMY;  Surgeon: Thurnell Lose, MD;  Location: [facility];  Service: Gynecology;  Laterality: Left;    FAMILY HISTORY: Family History  Problem Relation Age of Onset  . Cancer Mother     breast  . Cancer Father     prostate,colon  . Hypertension Father   . Thyroid disease Father   . High Cholesterol Father   . Thyroid disease Sister   . Cancer Paternal Aunt     colon  . Cancer Maternal Grandmother     kidney  . Cancer Paternal Grandmother     colon  . Cancer Paternal Aunt     lung  . Narcolepsy Neg Hx     SOCIAL HISTORY: History   Social History  . Marital Status: Married    Spouse Name: Elberta Fortis    Number of Children: 3  . Years of Education: Master's   Occupational History  . unemployed   .     Social History Main Topics  . Smoking status: Never Smoker   . Smokeless tobacco: Never Used  . Alcohol Use: No  . Drug Use: No  . Sexual Activity: Yes    Birth Control/ Protection: Surgical   Other Topics Concern  . Not on file   Social History Narrative  This patient has an RDI of 12 and AHI of 4.8,  titrated to 6 cm water after PSG, she did qualify for MSLT, Epworth 22 today on 6 cm water.      Based on her MSLT  of 3.6 minutes without SREM and Epworth of 22 on CPAP with residual AHI of 1.8, she should be treated  in a trial base for narcolepsy. She is on many REM supressant medications, and may not be able to  express the narcolepsy in an MSLT.    Patient is married Elberta Fortis) and lives at home with her husband and three children.   Patient is currently unemployed.   Patient has a Master's degree.   Patient is right-handed.   Patient drinks two cups of coffee on average but none lately for 6 months, 3 cups of tea daily.      PHYSICAL EXAM  Filed Vitals:   08/07/14 0820  BP: 120/77  Pulse: 84  Temp: 99 F (37.2 C)  TempSrc: Oral  Height: 5\' 2"  (1.575 m)  Weight: 164 lb (74.39 kg)   Body mass index is 29.99 kg/(m^2).  Generalized: Well developed, in no acute distress  Neck: Circumference 14 inches  Neurological examination  Mentation: Alert oriented to time, place, history taking. Follows all commands speech and language fluent Cranial nerve II-XII: Pupils were equal round reactive to light. Extraocular movements were full, visual field were full on confrontational test. Facial sensation and strength were normal. Uvula tongue midline. Head turning and shoulder shrug  were normal and symmetric. Motor: The motor testing reveals 5 over 5 strength of all 4 extremities. Good symmetric motor tone is noted throughout.  Sensory: Sensory testing is intact to soft touch on all 4 extremities. No evidence of extinction is noted.  Coordination: Cerebellar testing reveals good finger-nose-finger and heel-to-shin bilaterally.  Gait and station: Gait is normal. Tandem gait is normal. Romberg is negative. No drift is seen.     DIAGNOSTIC DATA (LABS, IMAGING, TESTING) - I reviewed patient records, labs, notes, testing and imaging myself where available.  Lab Results  Component Value Date   WBC 6.1 02/07/2014   HGB 13.4 02/07/2014   HCT 39.3 02/07/2014   MCV 82 02/07/2014   PLT 278 11/28/2013      Component Value Date/Time   NA 142 02/07/2014 1547   NA 142 11/28/2013 1155   K 4.8 02/07/2014 1547   CL 102 02/07/2014 1547   CO2 26 02/07/2014 1547   GLUCOSE 78 02/07/2014 1547   GLUCOSE 88 11/28/2013 1155   BUN 11 02/07/2014 1547    BUN 5* 11/28/2013 1155   CREATININE 0.81 02/07/2014 1547   CALCIUM 9.5 02/07/2014 1547   PROT 7.4 02/07/2014 1547   PROT 7.8 08/09/2013 0042   ALBUMIN 4.2 08/09/2013 0042   AST 20 02/07/2014 1547   ALT 17 02/07/2014 1547   ALKPHOS 60 02/07/2014 1547   BILITOT 0.3 02/07/2014 1547   GFRNONAA 89 02/07/2014 1547   GFRAA 102 02/07/2014 1547      ASSESSMENT AND PLAN 43 y.o. year old female  has a past medical history of Hypertension; Overactive bladder; Esophageal reflux; Migraine; Psychosis; Schizophrenia, schizo-affective; OCD (obsessive compulsive disorder); Bipolar 1 disorder; Restless leg syndrome; S/P lumbar fusion (2011); Torn rotator cuff (it:2012,rt:2013); Migraines; Depression; Anxiety; Narcolepsy; Migraine; Hypersomnia; Asthma; Sleep apnea; Seasonal allergies; Schizophrenia; Osteoarthritis; Chronic lower back pain (02/07/2014); and Medication monitoring encounter (02/07/2014). here with:  1. Narcolepsy  I will change the patient to Adderall extended release.  The patient is to take this 1-2 hours after she gets up in the morning. If the Adderall wears off by the afternoon may have to consider giving her an Adderall immediate release to take around lunchtime or increasing her extended release dose. I will also check blood work today. The patient will let us know how this is working for her. If her symptoms worsen or she develops new symptoms she should let us know. She will follow-up in 6 months or sooner if needed.  Ward Givens, MSN, NP-C 08/07/2014, 8:29 AM [facility] [address] [place], [place] [address] [phone]  Note: This document was prepared with digital dictation and possible smart phrase technology. Any transcriptional errors that result from this process are unintentional.

## 2014-08-08 LAB — COMPREHENSIVE METABOLIC PANEL
A/G RATIO: 1.7 (ref 1.1–2.5)
ALK PHOS: 61 IU/L (ref 39–117)
ALT: 9 IU/L (ref 0–32)
AST: 12 IU/L (ref 0–40)
Albumin: 4.8 g/dL (ref 3.5–5.5)
BILIRUBIN TOTAL: 0.3 mg/dL (ref 0.0–1.2)
BUN/Creatinine Ratio: 17 (ref 9–23)
BUN: 15 mg/dL (ref 6–24)
CO2: 24 mmol/L (ref 18–29)
Calcium: 9.6 mg/dL (ref 8.7–10.2)
Chloride: 99 mmol/L (ref 97–108)
Creatinine, Ser: 0.88 mg/dL (ref 0.57–1.00)
GFR, EST AFRICAN AMERICAN: 92 mL/min/{1.73_m2} (ref >59)
GFR, EST NON AFRICAN AMERICAN: 80 mL/min/{1.73_m2} (ref >59)
GLUCOSE: 90 mg/dL (ref 65–99)
Globulin, Total: 2.9 g/dL (ref 1.5–4.5)
POTASSIUM: 3.9 mmol/L (ref 3.5–5.2)
SODIUM: 142 mmol/L (ref 134–144)
Total Protein: 7.7 g/dL (ref 6.0–8.5)

## 2014-08-09 ENCOUNTER — Ambulatory Visit: Payer: Federal, State, Local not specified - PPO | Admitting: Adult Health

## 2014-08-12 NOTE — Progress Notes (Signed)
Called patient, advised of normal labs.

## 2015-02-11 ENCOUNTER — Telehealth: Payer: Self-pay

## 2015-02-11 ENCOUNTER — Other Ambulatory Visit: Payer: Self-pay

## 2015-02-11 MED ORDER — AMPHETAMINE-DEXTROAMPHET ER 20 MG PO CP24: 20.0000 mg | ORAL_CAPSULE | Freq: Every day | ORAL | Status: DC

## 2015-02-11 NOTE — Telephone Encounter (Signed)
Spoke to pt and informed her that her RX was available for her to pick up at the front desk. Pt verbalized understanding.

## 2015-02-11 NOTE — Telephone Encounter (Signed)
Patient of MM/CD.  MM is out of the office.  Has follow up appt scheduled

## 2015-02-14 ENCOUNTER — Telehealth: Payer: Self-pay | Admitting: Neurology

## 2015-02-14 NOTE — Telephone Encounter (Signed)
I called back to clarify.  Spoke with Lennette Bihari.  He reviewed patient file and said no one else is prescribing Adderall, and patient is not currently getting any other opiates from any providers, however, the pharmacist that worked last night noticed a note on the patient file saying she has a history of opiate dependency/abuse.  We do not prescribe any pain meds for this patient.  They wanted a message sent to Dr Brett Fairy making her aware of the notes they have on file, and ensure she is okay with the patient continuing to take Adderall.

## 2015-02-14 NOTE — Telephone Encounter (Signed)
Lennette Bihari from [facility] # [phone] called wanting to make Dr. Brett Fairy aware that the patient is has a history of Opioids dependency and abuse. He just wants to make sure the provider is aware and to confirm if the provider  wants to continue with amphetamine-dextroamphetamine (ADDERALL XR) 20 MG 24 hr capsule script.  Please call and advise.

## 2015-02-18 MED ORDER — AMPHETAMINE-DEXTROAMPHET ER 20 MG PO CP24: 20.0000 mg | ORAL_CAPSULE | Freq: Every day | ORAL | Status: DC

## 2015-02-18 NOTE — Telephone Encounter (Signed)
I will monitor the intake frequency, follow up . CD

## 2015-02-18 NOTE — Telephone Encounter (Signed)
Keko with [facility] called wanted to know if  Dr Brett Fairy is ok with patient taking Adderall even though it is not an opiate with patient's drug abuse history. Please call and advise. He can be reached  at [phone].

## 2015-02-19 ENCOUNTER — Telehealth: Payer: Self-pay | Admitting: Neurology

## 2015-02-19 ENCOUNTER — Telehealth: Payer: Self-pay

## 2015-02-19 NOTE — Telephone Encounter (Signed)
[facility] pharmacy called me inquiring whether it was ok to fill the adderall RX from last week. Per Dr. Edwena Felty note on 02/18/15 4:43 pm, we will monitor her intake. I let them know that it was ok to fill. Spoke to the pt. I told her that I spoke to the pharmacy and she should be able to get her RX filled from last week and to NOT pick up the RX from this morning that I called her about since the RX from last week would be honored. Pt verbalized understanding.

## 2015-02-19 NOTE — Telephone Encounter (Signed)
Patient is calling back in regard to her Rx pick up.  Thanks!

## 2015-02-19 NOTE — Telephone Encounter (Signed)
Spoke with pt and informed her that her RX is ready for pick up at the front desk.

## 2015-02-19 NOTE — Telephone Encounter (Signed)
Cathy with [facility] called regarding a contraindication with amphetamine-dextroamphetamine (ADDERALL XR) 20 MG 24 hr capsule. Please call and advise. She can be reached at [phone]

## 2015-02-19 NOTE — Telephone Encounter (Signed)
I called back.  Spoke with [employer].  She said they have already resolved the issue.

## 2015-02-19 NOTE — Telephone Encounter (Signed)
Called pt to tell her that her rx was ready for pick up. No answer, left message asking her to call me back.

## 2015-02-19 NOTE — Telephone Encounter (Signed)
Returned pt's call. No answer. Left a message.

## 2015-02-26 ENCOUNTER — Encounter: Payer: Self-pay | Admitting: Adult Health

## 2015-02-26 ENCOUNTER — Ambulatory Visit (INDEPENDENT_AMBULATORY_CARE_PROVIDER_SITE_OTHER): Payer: Federal, State, Local not specified - PPO | Admitting: Adult Health

## 2015-02-26 VITALS — BP 158/95 | HR 76 | Ht 62.0 in | Wt 160.0 lb

## 2015-02-26 DIAGNOSIS — G47419 Narcolepsy without cataplexy: Secondary | ICD-10-CM | POA: Diagnosis not present

## 2015-02-26 MED ORDER — AMPHETAMINE-DEXTROAMPHETAMINE 5 MG PO TABS: ORAL_TABLET | ORAL | Status: DC

## 2015-02-26 NOTE — Progress Notes (Signed)
PATIENT: [name] DOB: Dec 02, 1970  REASON FOR VISIT: follow up- Narolepsy HISTORY FROM: patient  HISTORY OF PRESENT ILLNESS: [name] is a 44 year old female with a history of narcolepsy. She returns today for follow-up. She is currently taking Adderall XR 20 mg 1 capsule daily. She reports that this works well initially. However by midday tends to wear off. She states that she takes her medication at 7:30 AM and by lunchtime she is starting to feel tired again. Patient states she goes to bed around 9:30 PM. She states that it typically takes her a while to fall asleep. She also has trouble staying asleep. The patient is on CPAP however there is been no download since 2014. I have asked the patient to bring in her machine and card for [place] to do a download. Her Epworth sleepiness score is 14 was previously 18 and fatigue severity score is 62 was previously 63. She returns today for an evaluation   HISTORY 08/07/14: [name] is a 44 year old female with a history of narcolepsy. She returns today for follow-up. She was prescribed Adderall 20 mg BID. She was taken off Xyrem because she was placed on fentanyl patches for pain control. She reports that her daytime sleepiness has not improved with Adderall. She has been only taking 1 Adderall 20 mg around mid day ( she thought that was how it was ordered). She states that the Adderall helps for several hours after taking it but then it wears off. She states that she is spending more time in the bed rather than up and doing things. She is also not getting restorative sleep which she feels is affecting her fatigue level. Her Epworth is 18 Fatigue severity score is 63.   HISTORY 02/07/14 [facility]): [name] just underwent a gallbladder surgery november 2014 under Dr Rush Farmer after losing 20 pounds with constant nausea, indigestion, abdominal cramping. Meanwhile there was another exploratory surgery April 12th 2015 and was diagnosed with a cyst  on the ovary and fallopian tube. The ovary,her last remaining was removed. She has gastroparesis. Dr. Michail Sermon placed her on Reglan and Linsette. She was referred to [facility] for September 2015 She was placed on fentanyl patches for pain control. She started these after she started [facility] and now reports taking the fentanyl every 2 days instead of every 3 days. This is contraindicated for XYREM users. [name] is here for her regular 6 month revisit for her Xyrem therapy .She has responded very well she is much less excessive daytime sleepy she has much less frequent narcoleptic and cataplectic attacks. As I have described in my past visit note , her MSLT documented a pathological degree of hypersomnia but was lacking the REM onset naps, which would diagnose narcolepsy. 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 Her response to Xyrem also was extremely preferable.After her last visit I had wanted the patient to be tested by the HDL - narcolepsy factorEpworth 15 on meds, 20 without XYREM and FSS 63 points. Based on her narcotic pain therapy needs, I doubt that her pain management physician sees any alternative to the current regimen.I will not refill her Xyrem she takes 9 g daily divided into doses at night. I will change her to Adderall 20 mg bid po. I will recheck her liver function tests. She was just discharged from the hospital; 6 weeks ago - RV in 6 month with NP Hassell Done for adderall refill, check up-  Epworth and FSS, weight , and LFT. Cc [employer]   REVIEW OF SYSTEMS: Out of a complete 14 system review of symptoms, the patient complains only of the following symptoms, and all other reviewed systems are negative.  See history of present illness  ALLERGIES: Allergies  Allergen Reactions  . Zofran [Ondansetron] Nausea And Vomiting  . Adhesive [Tape] Rash    Glue from steri strips  . Aspirin Nausea Only  . Benadryl [Diphenhydramine Hcl] Other  (See Comments)    Makes her hyper, jittery  . Codeine Nausea And Vomiting  . Dust Mite Extract Other (See Comments)    Cough, sneeze, eyes runny  . Mold Extract [Trichophyton] Other (See Comments)    Cough, sneeze, eyes/nose runny  . Pollen Extract Other (See Comments)    Cough, sneeze, eyes/nose runny  . Prednisone Other (See Comments)    Makes her hyper, jittery     HOME MEDICATIONS: Outpatient Prescriptions Prior to Visit  Medication Sig Dispense Refill  . AMITIZA 24 MCG capsule Take 24 mcg by mouth daily with breakfast.     . amphetamine-dextroamphetamine (ADDERALL XR) 20 MG 24 hr capsule Take 1 capsule (20 mg total) by mouth daily. 30 capsule 0  . budesonide-formoterol (SYMBICORT) 160-4.5 MCG/ACT inhaler Inhale 2 puffs into the lungs 2 (two) times daily.      . celecoxib (CELEBREX) 200 MG capsule Take 200 mg by mouth daily.     . clobetasol (TEMOVATE) 0.05 % cream Apply 1 application topically 2 (two) times daily.     . Clobetasol Propionate (CLOBEX) 0.05 % shampoo Apply 1 application topically once a week. wednesday    . clotrimazole-betamethasone (LOTRISONE) lotion Apply 1 application topically daily as needed (for rash).     . cycloSPORINE (RESTASIS) 0.05 % ophthalmic emulsion Place 1 drop into both eyes every 12 (twelve) hours.      . Dexlansoprazole (DEXILANT PO) Take by mouth.    . estrogens, conjugated, (PREMARIN) 0.625 MG tablet Take 1 tablet (0.625 mg total) by mouth daily. Take daily for 21 days then do not take for 7 days. (Patient not taking: Reported on 08/07/2014) 30 tablet 2  . EVAMIST 1.53 MG/SPRAY transdermal spray     . fentaNYL (DURAGESIC - DOSED MCG/HR) 75 MCG/HR Place 75 mcg onto the skin every other day.    . fesoterodine (TOVIAZ) 8 MG TB24 tablet Take 8 mg by mouth daily.    . fluconazole (DIFLUCAN) 150 MG tablet     . fluocinolone (DERMA-SMOOTHE/FS BODY) 0.01 % external oil Apply 1 application topically 3 (three) times daily.     . fluocinonide (LIDEX) 0.05  % cream Apply 1 application topically 3 (three) times a week.     . gabapentin (NEURONTIN) 300 MG capsule Take 300 mg by mouth 3 (three) times daily.     . hydrochlorothiazide (HYDRODIURIL) 25 MG tablet Take 25 mg by mouth every morning.     Marland Kitchen HYPERCARE 20 % external solution Apply 1 application topically daily as needed (to affected area).     . Linaclotide (LINZESS) 145 MCG CAPS capsule Take 145 mcg by mouth daily.    Marland Kitchen loteprednol (LOTEMAX) 0.2 % SUSP Place 1 drop into both eyes daily.    Marland Kitchen lurasidone (LATUDA) 40 MG TABS Take 40 mg by mouth at bedtime.     . Olopatadine HCl (PATADAY) 0.2 % SOLN Place 1 drop into both eyes 2 (two) times daily as needed (for allergies).     . OnabotulinumtoxinA (BOTOX IJ)  Inject as directed every 3 (three) months.    . pantoprazole (PROTONIX) 20 MG tablet Take 20 mg by mouth daily.    . polyethylene glycol (MIRALAX / GLYCOLAX) packet Take 17 g by mouth daily as needed for moderate constipation.     . pramipexole (MIRAPEX) 0.5 MG tablet Take 0.5 mg by mouth at bedtime.     . RELPAX 40 MG tablet     . tiZANidine (ZANAFLEX) 4 MG tablet Take 4 mg by mouth 2 (two) times daily.     Marland Kitchen topiramate (TOPAMAX) 100 MG tablet Take 100-200 mg by mouth 2 (two) times daily. 1 tab every AM and 2 at night    . triamcinolone cream (KENALOG) 0.1 % Apply 1 application topically daily as needed (to affected area).     . verapamil (VERELAN PM) 240 MG 24 hr capsule Take 240 mg by mouth at bedtime.       No facility-administered medications prior to visit.    PAST MEDICAL HISTORY: Past Medical History  Diagnosis Date  . Hypertension   . Overactive bladder   . Esophageal reflux   . Migraine   . Psychosis   . Schizophrenia, schizo-affective   . OCD (obsessive compulsive disorder)   . Bipolar 1 disorder   . Restless leg syndrome   . S/P lumbar fusion 2011  . Torn rotator cuff it:2012,rt:2013  . Migraines   . Depression     excessive daytime sleepiness-MSLT confirmed  pathological degree of hypersomnia-12/13  . Anxiety   . Narcolepsy   . Migraine   . Hypersomnia   . Asthma   . Sleep apnea     uses CPAP every night  . Seasonal allergies   . Schizophrenia   . Osteoarthritis     knees, hands, shoulder, lower back  . Chronic lower back pain 02/07/2014  . Medication monitoring encounter 02/07/2014    PAST SURGICAL HISTORY: Past Surgical History  Procedure Laterality Date  . Vesicovaginal fistula closure w/ tah  2001  . Tubal ligation  2004  . Shoulder surgery      left and right , bone spurs, torn muscle  . Abdominal hysterectomy  2001  . Spurs,ruptured disc bone  2009  . Lumbar fusion  2011    L4-L5  . Rotator cuff repair  2012,2013    torn   . Oophorectomy   2004    right  . Ankle fusion      right  . Cholecystectomy N/A 07/20/2013    Procedure: LAPAROSCOPIC CHOLECYSTECTOMY;  Surgeon: Harl Bowie, MD;  Location: [facility];  Service: General;  Laterality: N/A;  . Colonoscopy with propofol N/A 10/02/2013    Procedure: COLONOSCOPY WITH PROPOFOL;  Surgeon: Garlan Fair, MD;  Location: [facility];  Service: Endoscopy;  Laterality: N/A;  . Esophagogastroduodenoscopy (egd) with propofol N/A 10/02/2013    Procedure: ESOPHAGOGASTRODUODENOSCOPY (EGD) WITH PROPOFOL;  Surgeon: Garlan Fair, MD;  Location: [facility];  Service: Endoscopy;  Laterality: N/A;  . Wisdom tooth extraction    . Laparoscopic salpingo oopherectomy Left 12/05/2013    Procedure: LAPAROSCOPIC SALPINGO OOPHORECTOMY;  Surgeon: Thurnell Lose, MD;  Location: [facility];  Service: Gynecology;  Laterality: Left;    FAMILY HISTORY: Family History  Problem Relation Age of Onset  . Cancer Mother     breast  . Cancer Father     prostate,colon  . Hypertension Father   . Thyroid disease Father   . High Cholesterol Father   . Thyroid disease Sister   .  Cancer Paternal Aunt     colon  . Cancer Maternal Grandmother     kidney  . Cancer Paternal Grandmother     colon  .  Cancer Paternal Aunt     lung  . Narcolepsy Neg Hx        PHYSICAL EXAM  Filed Vitals:   02/26/15 1117  BP: 158/95  Pulse: 76  Height: 5\' 2"  (1.575 m)  Weight: 160 lb (72.576 kg)   There is no weight on file to calculate BMI.  Generalized: Well developed, in no acute distress   Neurological examination  Mentation: Alert oriented to time, place, history taking. Follows all commands speech and language fluent Cranial nerve II-XII: Pupils were equal round reactive to light. Extraocular movements were full, visual field were full on confrontational test. Facial sensation and strength were normal. Uvula tongue midline. Head turning and shoulder shrug  were normal and symmetric. Motor: The motor testing reveals 5 over 5 strength of all 4 extremities. Good symmetric motor tone is noted throughout.  Sensory: Sensory testing is intact to soft touch on all 4 extremities. No evidence of extinction is noted.  Coordination: Cerebellar testing reveals good finger-nose-finger and heel-to-shin bilaterally.  Gait and station: Gait is normal. Tandem gait is normal. Romberg is negative. No drift is seen.  Reflexes: Deep tendon reflexes are symmetric and normal bilaterally.    DIAGNOSTIC DATA (LABS, IMAGING, TESTING) - I reviewed patient records, labs, notes, testing and imaging myself where available.  Lab Results  Component Value Date   WBC 6.1 02/07/2014   HGB 13.4 02/07/2014   HCT 39.3 02/07/2014   MCV 82 02/07/2014   PLT 278 11/28/2013      Component Value Date/Time   NA 142 08/07/2014 0922   NA 142 11/28/2013 1155   K 3.9 08/07/2014 0922   CL 99 08/07/2014 0922   CO2 24 08/07/2014 0922   GLUCOSE 90 08/07/2014 0922   GLUCOSE 88 11/28/2013 1155   BUN 15 08/07/2014 0922   BUN 5* 11/28/2013 1155   CREATININE 0.88 08/07/2014 0922   CALCIUM 9.6 08/07/2014 0922   PROT 7.7 08/07/2014 0922   PROT 7.8 08/09/2013 0042   ALBUMIN 4.2 08/09/2013 0042   AST 12 08/07/2014 0922   ALT 9  08/07/2014 0922   ALKPHOS 61 08/07/2014 0922   BILITOT 0.3 08/07/2014 0922   GFRNONAA 80 08/07/2014 0922   GFRAA 92 08/07/2014 0922    ASSESSMENT AND PLAN 44 y.o. year old female  has a past medical history of Hypertension; Overactive bladder; Esophageal reflux; Migraine; Psychosis; Schizophrenia, schizo-affective; OCD (obsessive compulsive disorder); Bipolar 1 disorder; Restless leg syndrome; S/P lumbar fusion (2011); Torn rotator cuff (it:2012,rt:2013); Migraines; Depression; Anxiety; Narcolepsy; Migraine; Hypersomnia; Asthma; Sleep apnea; Seasonal allergies; Schizophrenia; Osteoarthritis; Chronic lower back pain (02/07/2014); and Medication monitoring encounter (02/07/2014). here with:  1. Narcolepsy  Continue Adderall XR 20 mg daily.  Add Adderall IR 5 mg daily at 1 PM to help midday sleepiness.  Advised to let us know if symptoms worsen.  F/U in 6 months with Dr. Brett Fairy.   Ward Givens, MSN, NP-C 02/26/2015, 11:11 AM [facility] [address] [place], [place] [address] [phone]  Note: This document was prepared with digital dictation and possible smart phrase technology. Any transcriptional errors that result from this process are unintentional.

## 2015-02-26 NOTE — Patient Instructions (Signed)
Begin taking Adderall 5 mg 1 tablet at 1PM.  Continue Adderall XR 20 mg in the morning. Try Melatonin 3 mg at bedtime.  If symptoms do not improve let us know.

## 2015-03-26 ENCOUNTER — Other Ambulatory Visit: Payer: Self-pay

## 2015-03-26 ENCOUNTER — Telehealth: Payer: Self-pay

## 2015-03-26 MED ORDER — AMPHETAMINE-DEXTROAMPHET ER 20 MG PO CP24: 20.0000 mg | ORAL_CAPSULE | Freq: Every day | ORAL | Status: DC

## 2015-03-26 MED ORDER — AMPHETAMINE-DEXTROAMPHETAMINE 5 MG PO TABS: ORAL_TABLET | ORAL | Status: DC

## 2015-03-26 NOTE — Telephone Encounter (Signed)
Spoke to pt and let her know that her RX is available for pick up at the front desk. Pt verbalized understanding.

## 2015-04-02 ENCOUNTER — Encounter (HOSPITAL_COMMUNITY): Payer: Self-pay | Admitting: *Deleted

## 2015-04-02 ENCOUNTER — Inpatient Hospital Stay (HOSPITAL_COMMUNITY)
Admission: EM | Admit: 2015-04-02 | Discharge: 2015-04-10 | DRG: 074 | Disposition: A | Discharge status: Home / Self Care | Payer: Federal, State, Local not specified - PPO | Attending: Internal Medicine | Admitting: Internal Medicine

## 2015-04-02 ENCOUNTER — Observation Stay (HOSPITAL_COMMUNITY): Payer: Federal, State, Local not specified - PPO

## 2015-04-02 DIAGNOSIS — F419 Anxiety disorder, unspecified: Secondary | ICD-10-CM | POA: Diagnosis present

## 2015-04-02 DIAGNOSIS — J45909 Unspecified asthma, uncomplicated: Secondary | ICD-10-CM | POA: Diagnosis present

## 2015-04-02 DIAGNOSIS — G8929 Other chronic pain: Secondary | ICD-10-CM | POA: Diagnosis present

## 2015-04-02 DIAGNOSIS — M545 Low back pain, unspecified: Secondary | ICD-10-CM | POA: Diagnosis present

## 2015-04-02 DIAGNOSIS — R519 Headache, unspecified: Secondary | ICD-10-CM

## 2015-04-02 DIAGNOSIS — M1990 Unspecified osteoarthritis, unspecified site: Secondary | ICD-10-CM | POA: Diagnosis present

## 2015-04-02 DIAGNOSIS — G4730 Sleep apnea, unspecified: Secondary | ICD-10-CM | POA: Diagnosis present

## 2015-04-02 DIAGNOSIS — D72829 Elevated white blood cell count, unspecified: Secondary | ICD-10-CM | POA: Diagnosis not present

## 2015-04-02 DIAGNOSIS — G2581 Restless legs syndrome: Secondary | ICD-10-CM | POA: Diagnosis present

## 2015-04-02 DIAGNOSIS — K3184 Gastroparesis: Secondary | ICD-10-CM | POA: Diagnosis present

## 2015-04-02 DIAGNOSIS — R51 Headache: Secondary | ICD-10-CM | POA: Diagnosis not present

## 2015-04-02 DIAGNOSIS — F209 Schizophrenia, unspecified: Secondary | ICD-10-CM | POA: Diagnosis present

## 2015-04-02 DIAGNOSIS — K529 Noninfective gastroenteritis and colitis, unspecified: Secondary | ICD-10-CM | POA: Diagnosis present

## 2015-04-02 DIAGNOSIS — E876 Hypokalemia: Secondary | ICD-10-CM | POA: Diagnosis not present

## 2015-04-02 DIAGNOSIS — I10 Essential (primary) hypertension: Secondary | ICD-10-CM | POA: Diagnosis present

## 2015-04-02 DIAGNOSIS — R197 Diarrhea, unspecified: Secondary | ICD-10-CM

## 2015-04-02 DIAGNOSIS — E1143 Type 2 diabetes mellitus with diabetic autonomic (poly)neuropathy: Secondary | ICD-10-CM | POA: Diagnosis present

## 2015-04-02 DIAGNOSIS — F42 Obsessive-compulsive disorder: Secondary | ICD-10-CM | POA: Diagnosis present

## 2015-04-02 DIAGNOSIS — R112 Nausea with vomiting, unspecified: Secondary | ICD-10-CM | POA: Diagnosis not present

## 2015-04-02 DIAGNOSIS — G43909 Migraine, unspecified, not intractable, without status migrainosus: Secondary | ICD-10-CM | POA: Diagnosis present

## 2015-04-02 DIAGNOSIS — R109 Unspecified abdominal pain: Secondary | ICD-10-CM | POA: Diagnosis not present

## 2015-04-02 DIAGNOSIS — R1084 Generalized abdominal pain: Secondary | ICD-10-CM | POA: Diagnosis not present

## 2015-04-02 DIAGNOSIS — K047 Periapical abscess without sinus: Secondary | ICD-10-CM | POA: Diagnosis present

## 2015-04-02 DIAGNOSIS — F319 Bipolar disorder, unspecified: Secondary | ICD-10-CM | POA: Diagnosis present

## 2015-04-02 HISTORY — DX: Gastroparesis: K31.84

## 2015-04-02 LAB — RAPID URINE DRUG SCREEN, HOSP PERFORMED
Amphetamines: NOT DETECTED
Barbiturates: NOT DETECTED
Benzodiazepines: NOT DETECTED
Cocaine: NOT DETECTED
Opiates: NOT DETECTED
TETRAHYDROCANNABINOL: NOT DETECTED

## 2015-04-02 LAB — CBC
HEMATOCRIT: 40.9 % (ref 36.0–46.0)
Hemoglobin: 14.3 g/dL (ref 12.0–15.0)
MCH: 28.4 pg (ref 26.0–34.0)
MCHC: 35 g/dL (ref 30.0–36.0)
MCV: 81.3 fL (ref 78.0–100.0)
Platelets: 252 10*3/uL (ref 150–400)
RBC: 5.03 MIL/uL (ref 3.87–5.11)
RDW: 13.4 % (ref 11.5–15.5)
WBC: 10.3 10*3/uL (ref 4.0–10.5)

## 2015-04-02 LAB — COMPREHENSIVE METABOLIC PANEL
ALT: 13 U/L — AB (ref 14–54)
AST: 22 U/L (ref 15–41)
Albumin: 4 g/dL (ref 3.5–5.0)
Alkaline Phosphatase: 52 U/L (ref 38–126)
Anion gap: 11 (ref 5–15)
BUN: 6 mg/dL (ref 6–20)
CALCIUM: 8.8 mg/dL — AB (ref 8.9–10.3)
CO2: 24 mmol/L (ref 22–32)
CREATININE: 0.76 mg/dL (ref 0.44–1.00)
Chloride: 104 mmol/L (ref 101–111)
GFR calc non Af Amer: >60 mL/min (ref >60)
Glucose, Bld: 114 mg/dL — ABNORMAL HIGH (ref 65–99)
POTASSIUM: 3.8 mmol/L (ref 3.5–5.1)
Sodium: 139 mmol/L (ref 135–145)
Total Bilirubin: 0.6 mg/dL (ref 0.3–1.2)
Total Protein: 7.6 g/dL (ref 6.5–8.1)

## 2015-04-02 LAB — LIPASE, BLOOD: LIPASE: 20 U/L — AB (ref 22–51)

## 2015-04-02 MED ORDER — ALUMINUM CHLORIDE 20 % EX SOLN: 1.0000 "application " | Freq: Every day | CUTANEOUS | Status: DC

## 2015-04-02 MED ORDER — SODIUM CHLORIDE 0.9 % IV BOLUS (SEPSIS)
1000.0000 mL | Freq: Once | INTRAVENOUS | Status: AC
  Administered 2015-04-02: 1000 mL via INTRAVENOUS

## 2015-04-02 MED ORDER — LINACLOTIDE 290 MCG PO CAPS
290.0000 ug | ORAL_CAPSULE | Freq: Every day | ORAL | Status: DC
  Administered 2015-04-05 – 2015-04-07 (×3): 290 ug via ORAL
  Filled 2015-04-02 (×6): qty 1

## 2015-04-02 MED ORDER — DICYCLOMINE HCL 10 MG PO CAPS
20.0000 mg | ORAL_CAPSULE | Freq: Once | ORAL | Status: DC
  Filled 2015-04-02: qty 2

## 2015-04-02 MED ORDER — TOPIRAMATE 100 MG PO TABS
200.0000 mg | ORAL_TABLET | Freq: Every day | ORAL | Status: DC
  Administered 2015-04-02 – 2015-04-09 (×7): 200 mg via ORAL
  Filled 2015-04-02 (×7): qty 2

## 2015-04-02 MED ORDER — AMPHETAMINE-DEXTROAMPHET ER 10 MG PO CP24
20.0000 mg | ORAL_CAPSULE | Freq: Every day | ORAL | Status: DC
  Administered 2015-04-05 – 2015-04-10 (×5): 20 mg via ORAL
  Filled 2015-04-02 (×6): qty 2

## 2015-04-02 MED ORDER — FENTANYL 50 MCG/HR TD PT72
75.0000 ug | MEDICATED_PATCH | TRANSDERMAL | Status: DC
  Administered 2015-04-03 – 2015-04-09 (×4): 75 ug via TRANSDERMAL
  Filled 2015-04-02 (×12): qty 1

## 2015-04-02 MED ORDER — TOPIRAMATE 100 MG PO TABS: 100.0000 mg | ORAL_TABLET | Freq: Two times a day (BID) | ORAL | Status: DC

## 2015-04-02 MED ORDER — BUDESONIDE-FORMOTEROL FUMARATE 160-4.5 MCG/ACT IN AERO
2.0000 | INHALATION_SPRAY | Freq: Two times a day (BID) | RESPIRATORY_TRACT | Status: DC
  Administered 2015-04-03 – 2015-04-09 (×13): 2 via RESPIRATORY_TRACT
  Filled 2015-04-02: qty 6

## 2015-04-02 MED ORDER — AMPHETAMINE-DEXTROAMPHETAMINE 10 MG PO TABS
5.0000 mg | ORAL_TABLET | Freq: Every day | ORAL | Status: DC
  Administered 2015-04-05 – 2015-04-09 (×3): 5 mg via ORAL
  Filled 2015-04-02 (×4): qty 1

## 2015-04-02 MED ORDER — LOTEPREDNOL ETABONATE 0.5 % OP SUSP
1.0000 [drp] | Freq: Every day | OPHTHALMIC | Status: DC
  Administered 2015-04-04 – 2015-04-10 (×7): 1 [drp] via OPHTHALMIC
  Filled 2015-04-02 (×2): qty 5

## 2015-04-02 MED ORDER — PRAMIPEXOLE DIHYDROCHLORIDE 1 MG PO TABS
1.0000 mg | ORAL_TABLET | Freq: Every day | ORAL | Status: DC
  Administered 2015-04-02 – 2015-04-09 (×7): 1 mg via ORAL
  Filled 2015-04-02 (×9): qty 1

## 2015-04-02 MED ORDER — PROMETHAZINE HCL 25 MG PO TABS
25.0000 mg | ORAL_TABLET | Freq: Four times a day (QID) | ORAL | Status: DC | PRN
  Filled 2015-04-02: qty 1

## 2015-04-02 MED ORDER — FLUOCINOLONE ACETONIDE SCALP 0.01 % EX OIL: 1.0000 "application " | TOPICAL_OIL | Freq: Every day | CUTANEOUS | Status: DC

## 2015-04-02 MED ORDER — TOPIRAMATE 100 MG PO TABS
100.0000 mg | ORAL_TABLET | Freq: Every day | ORAL | Status: DC
  Administered 2015-04-05 – 2015-04-10 (×6): 100 mg via ORAL
  Filled 2015-04-02 (×7): qty 1

## 2015-04-02 MED ORDER — LURASIDONE HCL 40 MG PO TABS
40.0000 mg | ORAL_TABLET | Freq: Every day | ORAL | Status: DC
  Administered 2015-04-02 – 2015-04-09 (×7): 40 mg via ORAL
  Filled 2015-04-02 (×9): qty 1

## 2015-04-02 MED ORDER — GABAPENTIN 300 MG PO CAPS
300.0000 mg | ORAL_CAPSULE | Freq: Three times a day (TID) | ORAL | Status: DC
  Administered 2015-04-02 – 2015-04-10 (×19): 300 mg via ORAL
  Filled 2015-04-02 (×19): qty 1

## 2015-04-02 MED ORDER — SODIUM CHLORIDE 0.9 % IV SOLN
INTRAVENOUS | Status: DC
  Administered 2015-04-02: via INTRAVENOUS

## 2015-04-02 MED ORDER — ACETAMINOPHEN 650 MG RE SUPP: 650.0000 mg | Freq: Four times a day (QID) | RECTAL | Status: DC | PRN

## 2015-04-02 MED ORDER — ACETAMINOPHEN 325 MG PO TABS
650.0000 mg | ORAL_TABLET | Freq: Four times a day (QID) | ORAL | Status: DC | PRN
  Administered 2015-04-06 – 2015-04-10 (×3): 650 mg via ORAL
  Filled 2015-04-02 (×3): qty 2

## 2015-04-02 MED ORDER — METOCLOPRAMIDE HCL 5 MG/ML IJ SOLN
10.0000 mg | INTRAMUSCULAR | Status: AC
  Administered 2015-04-02: 10 mg via INTRAVENOUS
  Filled 2015-04-02: qty 2

## 2015-04-02 MED ORDER — PANTOPRAZOLE SODIUM 40 MG PO TBEC
40.0000 mg | DELAYED_RELEASE_TABLET | Freq: Every day | ORAL | Status: DC
Start: 2015-04-03 — End: 2015-04-03

## 2015-04-02 MED ORDER — FENTANYL CITRATE (PF) 100 MCG/2ML IJ SOLN
25.0000 ug | Freq: Once | INTRAMUSCULAR | Status: AC
  Administered 2015-04-02: 25 ug via INTRAVENOUS
  Filled 2015-04-02: qty 2

## 2015-04-02 MED ORDER — TIZANIDINE HCL 4 MG PO TABS
4.0000 mg | ORAL_TABLET | Freq: Two times a day (BID) | ORAL | Status: DC
  Administered 2015-04-02 – 2015-04-10 (×13): 4 mg via ORAL
  Filled 2015-04-02 (×13): qty 1

## 2015-04-02 MED ORDER — VERAPAMIL HCL ER 240 MG PO TBCR
240.0000 mg | EXTENDED_RELEASE_TABLET | Freq: Every day | ORAL | Status: DC
  Administered 2015-04-02: 240 mg via ORAL
  Filled 2015-04-02 (×3): qty 1

## 2015-04-02 MED ORDER — PROMETHAZINE HCL 25 MG/ML IJ SOLN
25.0000 mg | Freq: Once | INTRAMUSCULAR | Status: AC
  Administered 2015-04-02: 25 mg via INTRAVENOUS
  Filled 2015-04-02: qty 1

## 2015-04-02 MED ORDER — CLOBETASOL PROPIONATE 0.05 % EX SHAM: 1.0000 "application " | MEDICATED_SHAMPOO | CUTANEOUS | Status: DC

## 2015-04-02 MED ORDER — CYCLOSPORINE 0.05 % OP EMUL
1.0000 [drp] | Freq: Two times a day (BID) | OPHTHALMIC | Status: DC
  Administered 2015-04-03 – 2015-04-10 (×16): 1 [drp] via OPHTHALMIC
  Filled 2015-04-02 (×19): qty 1

## 2015-04-02 MED ORDER — FESOTERODINE FUMARATE ER 8 MG PO TB24
8.0000 mg | ORAL_TABLET | Freq: Every day | ORAL | Status: DC
  Administered 2015-04-05 – 2015-04-10 (×6): 8 mg via ORAL
  Filled 2015-04-02 (×12): qty 1

## 2015-04-02 MED ORDER — ENOXAPARIN SODIUM 40 MG/0.4ML ~~LOC~~ SOLN
40.0000 mg | Freq: Every day | SUBCUTANEOUS | Status: DC
  Administered 2015-04-03 – 2015-04-10 (×8): 40 mg via SUBCUTANEOUS
  Filled 2015-04-02 (×8): qty 0.4

## 2015-04-02 MED ORDER — ELETRIPTAN HYDROBROMIDE 40 MG PO TABS
40.0000 mg | ORAL_TABLET | ORAL | Status: DC | PRN
  Filled 2015-04-02: qty 1

## 2015-04-02 MED ORDER — METOCLOPRAMIDE HCL 5 MG/ML IJ SOLN
10.0000 mg | Freq: Once | INTRAMUSCULAR | Status: AC
  Administered 2015-04-02: 10 mg via INTRAVENOUS
  Filled 2015-04-02: qty 2

## 2015-04-02 NOTE — H&P (Signed)
[facility] History and Physical  [name] VVO:[id number] DOB: October 20, 1970 DOA: 04/02/2015  Referring physician: ER physician. PCP: Donnie Coffin, MD  Specialists: Patient follows up with gastroenterologist at [facility].  Chief Complaint: Nausea vomiting diarrhea and abdominal pain.  HPI: [name] is a 44 y.o. female history of gastroparesis, asthma and chronic pain presents to the ER because of recurrent episodes of nausea vomiting and diarrhea as morning. Patient's pain has been generalized all over the abdomen. Patient states she has had recurrent episodes of diarrhea and vomiting which were nonbloody. Patient was placed on amoxicillin yesterday but patient's dentist for tooth infection. Patient took one dose of it. On exam patient's abdomen is soft but tender generalized with no guarding or rigidity or any peritoneal signs. Acute abdominal series is unremarkable. Patient has been admitted for further management.   Review of Systems: As presented in the history of presenting illness, rest negative.  Past Medical History  Diagnosis Date  . Hypertension   . Overactive bladder   . Esophageal reflux   . Migraine   . Psychosis   . Schizophrenia, schizo-affective   . OCD (obsessive compulsive disorder)   . Bipolar 1 disorder   . Restless leg syndrome   . S/P lumbar fusion 2011  . Torn rotator cuff it:2012,rt:2013  . Migraines   . Depression     excessive daytime sleepiness-MSLT confirmed pathological degree of hypersomnia-12/13  . Anxiety   . Narcolepsy   . Migraine   . Hypersomnia   . Asthma   . Sleep apnea     uses CPAP every night  . Seasonal allergies   . Schizophrenia   . Osteoarthritis     knees, hands, shoulder, lower back  . Chronic lower back pain 02/07/2014  . Medication monitoring encounter 02/07/2014  . Gastroparesis    Past Surgical History  Procedure Laterality Date  . Vesicovaginal fistula closure w/ tah  2001  . Tubal ligation  2004  .  Shoulder surgery      left and right , bone spurs, torn muscle  . Abdominal hysterectomy  2001  . Spurs,ruptured disc bone  2009  . Lumbar fusion  2011    L4-L5  . Rotator cuff repair  2012,2013    torn   . Oophorectomy   2004    right  . Ankle fusion      right  . Cholecystectomy N/A 07/20/2013    Procedure: LAPAROSCOPIC CHOLECYSTECTOMY;  Surgeon: Harl Bowie, MD;  Location: [facility];  Service: General;  Laterality: N/A;  . Colonoscopy with propofol N/A 10/02/2013    Procedure: COLONOSCOPY WITH PROPOFOL;  Surgeon: Garlan Fair, MD;  Location: [facility];  Service: Endoscopy;  Laterality: N/A;  . Esophagogastroduodenoscopy (egd) with propofol N/A 10/02/2013    Procedure: ESOPHAGOGASTRODUODENOSCOPY (EGD) WITH PROPOFOL;  Surgeon: Garlan Fair, MD;  Location: [facility];  Service: Endoscopy;  Laterality: N/A;  . Wisdom tooth extraction    . Laparoscopic salpingo oopherectomy Left 12/05/2013    Procedure: LAPAROSCOPIC SALPINGO OOPHORECTOMY;  Surgeon: Thurnell Lose, MD;  Location: [facility];  Service: Gynecology;  Laterality: Left;   Social History:  reports that she has never smoked. She has never used smokeless tobacco. She reports that she does not drink alcohol or use illicit drugs. Where does patient live home. Can patient participate in ADLs? Yes.  Allergies  Allergen Reactions  . Adhesive [Tape] Rash    Glue from steri strips  . Aspirin Nausea Only  . Benadryl [  Diphenhydramine Hcl] Other (See Comments)    Makes her hyper, jittery  . Codeine Nausea And Vomiting  . Dust Mite Extract Other (See Comments)    Cough, sneeze, eyes runny  . Mold Extract [Trichophyton] Other (See Comments)    Cough, sneeze, eyes/nose runny  . Pollen Extract Other (See Comments)    Cough, sneeze, eyes/nose runny  . Prednisone Other (See Comments)    Makes her hyper, jittery   . Zofran [Ondansetron] Nausea And Vomiting    Family History:  Family History  Problem Relation Age of Onset   . Cancer Mother     breast  . Cancer Father     prostate,colon  . Hypertension Father   . Thyroid disease Father   . High Cholesterol Father   . Thyroid disease Sister   . Cancer Paternal Aunt     colon  . Cancer Maternal Grandmother     kidney  . Cancer Paternal Grandmother     colon  . Cancer Paternal Aunt     lung  . Narcolepsy Neg Hx       Prior to Admission medications   Medication Sig Start Date End Date Taking? Authorizing Provider  amphetamine-dextroamphetamine (ADDERALL XR) 20 MG 24 hr capsule Take 1 capsule (20 mg total) by mouth daily. 03/26/15  Yes Carmen Dohmeier, MD  amphetamine-dextroamphetamine (ADDERALL) 5 MG tablet Take 1 tablet daily at 1PM. 03/26/15  Yes Larey Seat, MD  budesonide-formoterol (SYMBICORT) 160-4.5 MCG/ACT inhaler Inhale 2 puffs into the lungs 2 (two) times daily.     Yes Historical Provider, MD  Clobetasol Propionate (CLOBEX) 0.05 % shampoo Apply 1 application topically every Wednesday.    Yes Historical Provider, MD  cycloSPORINE (RESTASIS) 0.05 % ophthalmic emulsion Place 1 drop into both eyes every 12 (twelve) hours.     Yes Historical Provider, MD  dexlansoprazole (DEXILANT) 60 MG capsule Take 60 mg by mouth daily.   Yes Historical Provider, MD  fentaNYL (DURAGESIC - DOSED MCG/HR) 75 MCG/HR Place 75 mcg onto the skin every other day.   Yes Historical Provider, MD  fesoterodine (TOVIAZ) 8 MG TB24 tablet Take 8 mg by mouth daily.   Yes Historical Provider, MD  FLUOCINOLONE ACETONIDE SCALP 0.01 % OIL Apply 1 application topically daily. 02/19/15  Yes Historical Provider, MD  fluocinonide (LIDEX) 0.05 % cream Apply 1 application topically 3 (three) times a week.    Yes Historical Provider, MD  gabapentin (NEURONTIN) 300 MG capsule Take 300 mg by mouth 3 (three) times daily.    Yes Historical Provider, MD  hydrochlorothiazide (HYDRODIURIL) 25 MG tablet Take 25 mg by mouth every morning.    Yes Historical Provider, MD  HYPERCARE 20 % external  solution Apply 1 application topically daily.  02/01/13  Yes Historical Provider, MD  LINZESS 290 MCG CAPS capsule Take 290 mcg by mouth daily. 01/30/15  Yes Historical Provider, MD  loteprednol (LOTEMAX) 0.2 % SUSP Place 1 drop into both eyes daily.   Yes Historical Provider, MD  lurasidone (LATUDA) 40 MG TABS Take 40 mg by mouth at bedtime.    Yes Historical Provider, MD  meloxicam (MOBIC) 15 MG tablet Take 15 mg by mouth daily.   Yes Historical Provider, MD  MINIVELLE 0.1 MG/24HR patch Apply 1 patch topically 2 (two) times a week. 03/21/15  Yes Historical Provider, MD  Olopatadine HCl (PATADAY) 0.2 % SOLN Place 1 drop into both eyes 2 (two) times daily as needed (for allergies).    Yes Historical Provider, MD  OnabotulinumtoxinA (BOTOX IJ) Inject as directed every 3 (three) months.   Yes Historical Provider, MD  pantoprazole (PROTONIX) 20 MG tablet Take 20 mg by mouth daily.   Yes Historical Provider, MD  pramipexole (MIRAPEX) 1 MG tablet Take 1 mg by mouth at bedtime. 02/19/15  Yes Historical Provider, MD  promethazine (PHENERGAN) 25 MG tablet Take 25 mg by mouth every 6 (six) hours as needed for nausea or vomiting.  01/28/15  Yes Historical Provider, MD  RELPAX 40 MG tablet Take 40 mg by mouth every 2 (two) hours as needed for migraine or headache.  12/04/13  Yes Historical Provider, MD  tiZANidine (ZANAFLEX) 4 MG tablet Take 4 mg by mouth 2 (two) times daily.    Yes Historical Provider, MD  topiramate (TOPAMAX) 100 MG tablet Take 100-200 mg by mouth 2 (two) times daily. 1 tab every AM and 2 at night   Yes Historical Provider, MD  triamcinolone cream (KENALOG) 0.1 % Apply 1 application topically daily as needed (to affected area).  11/21/12  Yes Historical Provider, MD  verapamil (VERELAN PM) 240 MG 24 hr capsule Take 240 mg by mouth at bedtime.     Yes Historical Provider, MD    Physical Exam: Filed Vitals:   04/02/15 1956 04/02/15 2100 04/02/15 2200 04/02/15 2252  BP: 129/91 163/88 165/95  165/100  Pulse: 101 94 102 93  Temp: 97.6 F (36.4 C)   99.3 F (37.4 C)  TempSrc: Oral   Oral  Resp: 20   20  Height:      Weight:      SpO2: 99% 100% 100% 100%     General:  Moderately built and nourished.  Eyes: Anicteric no pallor.  ENT: No obvious gum swelling seen.  Neck: No mass felt. No neck rigidity.  Cardiovascular: S1-S2 heard.  Respiratory: No rhonchi or crepitations.  Abdomen: Soft mild tenderness generalized no guarding or rigidity or peritoneal signs.  Skin: No rash.  Musculoskeletal: No edema.  Psychiatric: Appears normal.  Neurologic: Alert awake oriented to time place and person. Moves all extremities.  Labs on Admission:  Basic Metabolic Panel:  Recent Labs Lab 04/02/15 1427  NA 139  K 3.8  CL 104  CO2 24  GLUCOSE 114*  BUN 6  CREATININE 0.76  CALCIUM 8.8*   Liver Function Tests:  Recent Labs Lab 04/02/15 1427  AST 22  ALT 13*  ALKPHOS 52  BILITOT 0.6  PROT 7.6  ALBUMIN 4.0    Recent Labs Lab 04/02/15 1427  LIPASE 20*   No results for input(s): AMMONIA in the last 168 hours. CBC:  Recent Labs Lab 04/02/15 1427  WBC 10.3  HGB 14.3  HCT 40.9  MCV 81.3  PLT 252   Cardiac Enzymes: No results for input(s): CKTOTAL, CKMB, CKMBINDEX, TROPONINI in the last 168 hours.  BNP (last 3 results) No results for input(s): BNP in the last 8760 hours.  ProBNP (last 3 results) No results for input(s): PROBNP in the last 8760 hours.  CBG: No results for input(s): GLUCAP in the last 168 hours.  Radiological Exams on Admission: Dg Abd Acute W/chest  04/02/2015   CLINICAL DATA:  Abdominal pain for 1 day.  EXAM: DG ABDOMEN ACUTE W/ 1V CHEST  COMPARISON:  CT 10/19/2013  FINDINGS: The cardiomediastinal contours are normal. The lungs are clear. There is no free intra-abdominal air. No dilated bowel loops to suggest obstruction. Scattered air throughout normal caliber small bowel loops in the right abdomen, there is otherwise  a  paucity of bowel gas. Surgical clips in the right upper quadrant of the abdomen from cholecystectomy. Post-surgical change in the lower lumbar spine. No radiopaque calculi. No acute osseous abnormalities are seen.  IMPRESSION: 1. No radiographic findings of bowel obstruction or free air. 2. Air within normal caliber small bowel loops in the right abdomen, there is otherwise a paucity of bowel gas.   Electronically Signed   By: Jeb Levering M.D.   On: 04/02/2015 21:57     Assessment/Plan Principal Problem:   Nausea vomiting and diarrhea Active Problems:   Chronic lower back pain   Abdominal pain   Extrinsic asthma   1. Nausea vomiting and diarrhea with abdominal pain - suspect patient may be having colitis. Patient does have a history of gastroparesis but patient is also having diarrhea this time. Given that patient had taken antibiotics yesterday for tooth infection I have ordered C. difficile panel. Continue with hydration. Closely observe. 2. Chronic low back pain - continue home medications. 3. History of asthma - presently not wheezing. Continue home inhalers.   DVT Prophylaxis Lovenox.  Code Status: Full code.  Family Communication: Discussed with patient.  Disposition Plan: Admit to inpatient.    Mikah Rottinghaus N. [facility] Pager [phone].  If 7PM-7AM, please contact night-coverage [email or web address] Password [facility] 04/02/2015, 10:57 PM

## 2015-04-02 NOTE — ED Provider Notes (Signed)
CSN: [id number]     Arrival date & time 04/02/15  1322 History   First MD Initiated Contact with Patient 04/02/15 1506     Chief Complaint  Patient presents with  . Abdominal Pain  . Emesis  . Diarrhea     (Consider location/radiation/quality/duration/timing/severity/associated sxs/prior Treatment) The history is provided by the patient and medical records.   This is a 43 y.o. F with hx of HTN, OAB, migraine headaches, schizophrenia, bipolar disorder, RLS, depression, anxiety, presenting to the ED for abdominal pain, N/V/D.  patient reports she has history of gastroparesis and thinks she is having one of her flares. She denies any recent fever, chills, dietary changes, travel, or known sick contacts. States generalized abdominal cramping. She denies any urinary symptoms. No vaginal complaints. Patient is followed by GI at [facility].  No intervention tried PTA.  Daughter reports last emesis was just prior to arrival in ED.  No vomiting since arriving here.  VSS.  Past Medical History  Diagnosis Date  . Hypertension   . Overactive bladder   . Esophageal reflux   . Migraine   . Psychosis   . Schizophrenia, schizo-affective   . OCD (obsessive compulsive disorder)   . Bipolar 1 disorder   . Restless leg syndrome   . S/P lumbar fusion 2011  . Torn rotator cuff it:2012,rt:2013  . Migraines   . Depression     excessive daytime sleepiness-MSLT confirmed pathological degree of hypersomnia-12/13  . Anxiety   . Narcolepsy   . Migraine   . Hypersomnia   . Asthma   . Sleep apnea     uses CPAP every night  . Seasonal allergies   . Schizophrenia   . Osteoarthritis     knees, hands, shoulder, lower back  . Chronic lower back pain 02/07/2014  . Medication monitoring encounter 02/07/2014  . Gastroparesis    Past Surgical History  Procedure Laterality Date  . Vesicovaginal fistula closure w/ tah  2001  . Tubal ligation  2004  . Shoulder surgery      left and right , bone spurs, torn muscle  .  Abdominal hysterectomy  2001  . Spurs,ruptured disc bone  2009  . Lumbar fusion  2011    L4-L5  . Rotator cuff repair  2012,2013    torn   . Oophorectomy   2004    right  . Ankle fusion      right  . Cholecystectomy N/A 07/20/2013    Procedure: LAPAROSCOPIC CHOLECYSTECTOMY;  Surgeon: Harl Bowie, MD;  Location: [facility];  Service: General;  Laterality: N/A;  . Colonoscopy with propofol N/A 10/02/2013    Procedure: COLONOSCOPY WITH PROPOFOL;  Surgeon: Garlan Fair, MD;  Location: [facility];  Service: Endoscopy;  Laterality: N/A;  . Esophagogastroduodenoscopy (egd) with propofol N/A 10/02/2013    Procedure: ESOPHAGOGASTRODUODENOSCOPY (EGD) WITH PROPOFOL;  Surgeon: Garlan Fair, MD;  Location: [facility];  Service: Endoscopy;  Laterality: N/A;  . Wisdom tooth extraction    . Laparoscopic salpingo oopherectomy Left 12/05/2013    Procedure: LAPAROSCOPIC SALPINGO OOPHORECTOMY;  Surgeon: Thurnell Lose, MD;  Location: [facility];  Service: Gynecology;  Laterality: Left;   Family History  Problem Relation Age of Onset  . Cancer Mother     breast  . Cancer Father     prostate,colon  . Hypertension Father   . Thyroid disease Father   . High Cholesterol Father   . Thyroid disease Sister   . Cancer Paternal Aunt  colon  . Cancer Maternal Grandmother     kidney  . Cancer Paternal Grandmother     colon  . Cancer Paternal Aunt     lung  . Narcolepsy Neg Hx    History  Substance Use Topics  . Smoking status: Never Smoker   . Smokeless tobacco: Never Used  . Alcohol Use: No   OB History    No data available     Review of Systems  Gastrointestinal: Positive for nausea, vomiting, abdominal pain and diarrhea.  All other systems reviewed and are negative.     Allergies  Zofran; Adhesive; Aspirin; Benadryl; Codeine; Dust mite extract; Mold extract; Pollen extract; and Prednisone  Home Medications   Prior to Admission medications   Medication Sig Start Date End  Date Taking? Authorizing Provider  amphetamine-dextroamphetamine (ADDERALL XR) 20 MG 24 hr capsule Take 1 capsule (20 mg total) by mouth daily. 03/26/15   Asencion Partridge Dohmeier, MD  amphetamine-dextroamphetamine (ADDERALL) 5 MG tablet Take 1 tablet daily at 1PM. 03/26/15   Larey Seat, MD  budesonide-formoterol [facility]) 160-4.5 MCG/ACT inhaler Inhale 2 puffs into the lungs 2 (two) times daily.      Historical Provider, MD  clobetasol (TEMOVATE) 0.05 % cream Apply 1 application topically 2 (two) times daily.     Historical Provider, MD  Clobetasol Propionate (CLOBEX) 0.05 % shampoo Apply 1 application topically once a week. wednesday    Historical Provider, MD  clotrimazole-betamethasone (LOTRISONE) lotion Apply 1 application topically daily as needed (for rash).  02/02/13   Historical Provider, MD  cycloSPORINE (RESTASIS) 0.05 % ophthalmic emulsion Place 1 drop into both eyes every 12 (twelve) hours.      Historical Provider, MD  Dexlansoprazole (DEXILANT PO) Take by mouth.    Historical Provider, MD  fentaNYL (DURAGESIC - DOSED MCG/HR) 75 MCG/HR Place 75 mcg onto the skin every other day.    Historical Provider, MD  fesoterodine (TOVIAZ) 8 MG TB24 tablet Take 8 mg by mouth daily.    Historical Provider, MD  fluconazole (DIFLUCAN) 150 MG tablet  01/31/14   Historical Provider, MD  fluocinolone (DERMA-SMOOTHE/FS BODY) 0.01 % external oil Apply 1 application topically 3 (three) times daily.     Historical Provider, MD  fluocinonide (LIDEX) 0.05 % cream Apply 1 application topically 3 (three) times a week.     Historical Provider, MD  gabapentin (NEURONTIN) 300 MG capsule Take 300 mg by mouth 3 (three) times daily.     Historical Provider, MD  hydrochlorothiazide (HYDRODIURIL) 25 MG tablet Take 25 mg by mouth every morning.     Historical Provider, MD  HYPERCARE 20 % external solution Apply 1 application topically daily as needed (to affected area).  02/01/13   Historical Provider, MD  Linaclotide  Rolan Lipa) 145 MCG CAPS capsule Take 145 mcg by mouth daily.    Historical Provider, MD  loteprednol (LOTEMAX) 0.2 % SUSP Place 1 drop into both eyes daily.    Historical Provider, MD  lurasidone (LATUDA) 40 MG TABS Take 40 mg by mouth at bedtime.     Historical Provider, MD  meloxicam (MOBIC) 15 MG tablet Take 15 mg by mouth daily.    Historical Provider, MD  Olopatadine HCl (PATADAY) 0.2 % SOLN Place 1 drop into both eyes 2 (two) times daily as needed (for allergies).     Historical Provider, MD  OnabotulinumtoxinA (BOTOX IJ) Inject as directed every 3 (three) months.    Historical Provider, MD  pantoprazole (PROTONIX) 20 MG tablet Take 20 mg  by mouth daily.    Historical Provider, MD  polyethylene glycol (MIRALAX / GLYCOLAX) packet Take 17 g by mouth daily as needed for moderate constipation.     Historical Provider, MD  pramipexole (MIRAPEX) 0.5 MG tablet Take 0.5 mg by mouth at bedtime.     Historical Provider, MD  RELPAX 40 MG tablet  12/04/13   Historical Provider, MD  tiZANidine (ZANAFLEX) 4 MG tablet Take 4 mg by mouth 2 (two) times daily.     Historical Provider, MD  topiramate (TOPAMAX) 100 MG tablet Take 100-200 mg by mouth 2 (two) times daily. 1 tab every AM and 2 at night    Historical Provider, MD  triamcinolone cream (KENALOG) 0.1 % Apply 1 application topically daily as needed (to affected area).  11/21/12   Historical Provider, MD  verapamil (VERELAN PM) 240 MG 24 hr capsule Take 240 mg by mouth at bedtime.      Historical Provider, MD   BP 149/89 mmHg  Pulse 81  Temp(Src) 97.6 F (36.4 C) (Oral)  Resp 18  Ht 5\' 2"  (1.575 m)  Wt 165 lb (74.844 kg)  BMI 30.17 kg/m2  SpO2 98%   Physical Exam  Constitutional: She is oriented to person, place, and time. She appears well-developed and well-nourished. No distress.  Dry heaving, no active vomiting  HENT:  Head: Normocephalic and atraumatic.  Mouth/Throat: Oropharynx is clear and moist.  Eyes: Conjunctivae and EOM are normal.  Pupils are equal, round, and reactive to light.  Neck: Normal range of motion. Neck supple.  Cardiovascular: Normal rate, regular rhythm and normal heart sounds.   Pulmonary/Chest: Effort normal and breath sounds normal. No respiratory distress. She has no wheezes.  Abdominal: Soft. Bowel sounds are normal. There is no tenderness. There is no guarding.  Musculoskeletal: Normal range of motion. She exhibits no edema.  Neurological: She is alert and oriented to person, place, and time.  Skin: Skin is warm and dry. She is not diaphoretic.  Psychiatric: She has a normal mood and affect.  Nursing note and vitals reviewed.   ED Course  Procedures (including critical care time) Labs Review Labs Reviewed  LIPASE, BLOOD - Abnormal; Notable for the following:    Lipase 20 (*)    All other components within normal limits  COMPREHENSIVE METABOLIC PANEL - Abnormal; Notable for the following:    Glucose, Bld 114 (*)    Calcium 8.8 (*)    ALT 13 (*)    All other components within normal limits  CBC  URINE RAPID DRUG SCREEN, HOSP PERFORMED    Imaging Review Dg Abd Acute W/chest  04/02/2015   CLINICAL DATA:  Abdominal pain for 1 day.  EXAM: DG ABDOMEN ACUTE W/ 1V CHEST  COMPARISON:  CT 10/19/2013  FINDINGS: The cardiomediastinal contours are normal. The lungs are clear. There is no free intra-abdominal air. No dilated bowel loops to suggest obstruction. Scattered air throughout normal caliber small bowel loops in the right abdomen, there is otherwise a paucity of bowel gas. Surgical clips in the right upper quadrant of the abdomen from cholecystectomy. Post-surgical change in the lower lumbar spine. No radiopaque calculi. No acute osseous abnormalities are seen.  IMPRESSION: 1. No radiographic findings of bowel obstruction or free air. 2. Air within normal caliber small bowel loops in the right abdomen, there is otherwise a paucity of bowel gas.   Electronically Signed   By: Jeb Levering M.D.   On:  04/02/2015 21:57     EKG  Interpretation None      MDM   Final diagnoses:  Nausea and vomiting, vomiting of unspecified type   44 year old female here for abdominal cramping, nausea, vomiting, and diarrhea for the past 2 days. She has history of gastroparesis.  Patient is afebrile, nontoxic. At time of my initial evaluation she is sleeping comfortably in bed without any signs of distress. Once awoken she begins moaning and dry heaving into emesis bag without active vomiting.  Abdominal exam benign.  Lab work pending.  Patient will be given IVF and reglan.  Labwork is reassuring. Patient was treated with IV fluids and Reglan. Attempted PO trial and patient began vomiting. She was given additional IVF and phenergan, failed PO challenge once again.  Patient admitted for refractory N/V, likely secondary to her gastroparesis.  Larene Pickett, PA-C 04/02/15 [place], MD 04/02/15 [phone]

## 2015-04-02 NOTE — ED Notes (Signed)
Pt continues to call out for pain medication.  PA made aware.  PA recently discussed with pt about her pain medicine needs and the plan to admit her for better control.  Pt given a warm blanket and readjusted for comfort.

## 2015-04-02 NOTE — ED Notes (Signed)
Pt moved to hallway on pod A after family called 911 from waiting room. [employer] came to check on pt in waiting room. Lamar Sprinkles, RN AD notified, spoke with family.

## 2015-04-02 NOTE — ED Notes (Signed)
Pt reports hx of gastroparesis, having n/v, abd pain and diarrhea since Monday.

## 2015-04-02 NOTE — Progress Notes (Signed)
Received report from [place], RN in ED for transfer of pt to [phone].

## 2015-04-02 NOTE — ED Notes (Signed)
Pt's daughter to nurses desk-- insisting on speaking to "the director". Charge nurse notified. Pt's family stopping [employer] officers in hallway to insist on pt being moved into a room. Pt resting without vomiting,

## 2015-04-02 NOTE — ED Notes (Signed)
Pt family continues to be upset rt wait time and the tx of the pt. Pt daughter states her mother wasn't seen quick enough and shouldn't have been put in a hallway and forgotten. Pt was seen several times by a RN and tech in the hallway, received meds and saw a provider.

## 2015-04-02 NOTE — ED Notes (Signed)
Attempted report x1. 

## 2015-04-02 NOTE — Progress Notes (Signed)
Attempted report. RN will call back. 

## 2015-04-03 ENCOUNTER — Inpatient Hospital Stay (HOSPITAL_COMMUNITY): Payer: Federal, State, Local not specified - PPO

## 2015-04-03 DIAGNOSIS — D72829 Elevated white blood cell count, unspecified: Secondary | ICD-10-CM | POA: Clinically undetermined

## 2015-04-03 LAB — MAGNESIUM: Magnesium: 1.7 mg/dL (ref 1.7–2.4)

## 2015-04-03 LAB — COMPREHENSIVE METABOLIC PANEL
ALT: 15 U/L (ref 14–54)
ANION GAP: 11 (ref 5–15)
AST: 21 U/L (ref 15–41)
Albumin: 4.1 g/dL (ref 3.5–5.0)
Alkaline Phosphatase: 62 U/L (ref 38–126)
BILIRUBIN TOTAL: 0.8 mg/dL (ref 0.3–1.2)
CHLORIDE: 108 mmol/L (ref 101–111)
CO2: 20 mmol/L — ABNORMAL LOW (ref 22–32)
CREATININE: 0.7 mg/dL (ref 0.44–1.00)
Calcium: 9.4 mg/dL (ref 8.9–10.3)
GFR calc Af Amer: >60 mL/min (ref >60)
GFR calc non Af Amer: >60 mL/min (ref >60)
Glucose, Bld: 117 mg/dL — ABNORMAL HIGH (ref 65–99)
Potassium: 3.9 mmol/L (ref 3.5–5.1)
Sodium: 139 mmol/L (ref 135–145)
Total Protein: 7.4 g/dL (ref 6.5–8.1)

## 2015-04-03 LAB — CBC WITH DIFFERENTIAL/PLATELET
Basophils Absolute: 0 10*3/uL (ref 0.0–0.1)
Basophils Relative: 0 % (ref 0–1)
EOS PCT: 0 % (ref 0–5)
Eosinophils Absolute: 0 10*3/uL (ref 0.0–0.7)
HCT: 40.8 % (ref 36.0–46.0)
Hemoglobin: 14.2 g/dL (ref 12.0–15.0)
Lymphocytes Relative: 10 % — ABNORMAL LOW (ref 12–46)
Lymphs Abs: 1.5 10*3/uL (ref 0.7–4.0)
MCH: 28.1 pg (ref 26.0–34.0)
MCHC: 34.8 g/dL (ref 30.0–36.0)
MCV: 80.6 fL (ref 78.0–100.0)
MONOS PCT: 3 % (ref 3–12)
Monocytes Absolute: 0.5 10*3/uL (ref 0.1–1.0)
Neutro Abs: 13.3 10*3/uL — ABNORMAL HIGH (ref 1.7–7.7)
Neutrophils Relative %: 87 % — ABNORMAL HIGH (ref 43–77)
Platelets: 253 10*3/uL (ref 150–400)
RBC: 5.06 MIL/uL (ref 3.87–5.11)
RDW: 13.5 % (ref 11.5–15.5)
WBC: 15.2 10*3/uL — ABNORMAL HIGH (ref 4.0–10.5)

## 2015-04-03 MED ORDER — IOHEXOL 300 MG/ML  SOLN
100.0000 mL | Freq: Once | INTRAMUSCULAR | Status: AC | PRN
  Administered 2015-04-03: 100 mL via INTRAVENOUS

## 2015-04-03 MED ORDER — CIPROFLOXACIN IN D5W 400 MG/200ML IV SOLN
400.0000 mg | Freq: Two times a day (BID) | INTRAVENOUS | Status: DC
  Administered 2015-04-03 – 2015-04-04 (×2): 400 mg via INTRAVENOUS
  Filled 2015-04-03 (×2): qty 200

## 2015-04-03 MED ORDER — PANTOPRAZOLE SODIUM 40 MG IV SOLR
40.0000 mg | INTRAVENOUS | Status: DC
  Administered 2015-04-03 – 2015-04-08 (×6): 40 mg via INTRAVENOUS
  Filled 2015-04-03 (×7): qty 40

## 2015-04-03 MED ORDER — SACCHAROMYCES BOULARDII 250 MG PO CAPS
250.0000 mg | ORAL_CAPSULE | Freq: Two times a day (BID) | ORAL | Status: DC
  Administered 2015-04-04 – 2015-04-10 (×12): 250 mg via ORAL
  Filled 2015-04-03 (×12): qty 1

## 2015-04-03 MED ORDER — SODIUM CHLORIDE 0.9 % IV SOLN
INTRAVENOUS | Status: DC
  Administered 2015-04-03 – 2015-04-05 (×3): via INTRAVENOUS
  Filled 2015-04-03: qty 1000

## 2015-04-03 MED ORDER — PROMETHAZINE HCL 25 MG/ML IJ SOLN
12.5000 mg | Freq: Four times a day (QID) | INTRAMUSCULAR | Status: DC | PRN
  Administered 2015-04-03 – 2015-04-07 (×8): 12.5 mg via INTRAVENOUS
  Filled 2015-04-03 (×8): qty 1

## 2015-04-03 MED ORDER — BOOST / RESOURCE BREEZE PO LIQD: 1.0000 | Freq: Three times a day (TID) | ORAL | Status: DC

## 2015-04-03 MED ORDER — MORPHINE SULFATE 2 MG/ML IJ SOLN
2.0000 mg | Freq: Once | INTRAMUSCULAR | Status: AC
  Administered 2015-04-03: 2 mg via INTRAVENOUS
  Filled 2015-04-03: qty 1

## 2015-04-03 MED ORDER — MAGNESIUM SULFATE 2 GM/50ML IV SOLN
2.0000 g | Freq: Once | INTRAVENOUS | Status: AC
  Administered 2015-04-03: 2 g via INTRAVENOUS
  Filled 2015-04-03: qty 50

## 2015-04-03 MED ORDER — PROMETHAZINE HCL 25 MG/ML IJ SOLN
12.5000 mg | Freq: Once | INTRAMUSCULAR | Status: AC
  Administered 2015-04-03: 12.5 mg via INTRAVENOUS
  Filled 2015-04-03: qty 1

## 2015-04-03 MED ORDER — MORPHINE SULFATE 2 MG/ML IJ SOLN
2.0000 mg | INTRAMUSCULAR | Status: DC | PRN
  Administered 2015-04-03 – 2015-04-07 (×17): 2 mg via INTRAVENOUS
  Filled 2015-04-03 (×17): qty 1

## 2015-04-03 MED ORDER — PROMETHAZINE HCL 25 MG RE SUPP
25.0000 mg | Freq: Four times a day (QID) | RECTAL | Status: DC | PRN
  Filled 2015-04-03: qty 1

## 2015-04-03 MED ORDER — METRONIDAZOLE IN NACL 5-0.79 MG/ML-% IV SOLN
500.0000 mg | Freq: Three times a day (TID) | INTRAVENOUS | Status: DC
  Administered 2015-04-03 – 2015-04-06 (×9): 500 mg via INTRAVENOUS
  Filled 2015-04-03 (×9): qty 100

## 2015-04-03 MED ORDER — KETOROLAC TROMETHAMINE 30 MG/ML IJ SOLN
30.0000 mg | Freq: Four times a day (QID) | INTRAMUSCULAR | Status: DC | PRN
  Administered 2015-04-03: 30 mg via INTRAVENOUS
  Filled 2015-04-03: qty 1

## 2015-04-03 MED ORDER — PNEUMOCOCCAL VAC POLYVALENT 25 MCG/0.5ML IJ INJ
0.5000 mL | INJECTION | INTRAMUSCULAR | Status: AC
  Administered 2015-04-04: 0.5 mL via INTRAMUSCULAR
  Filled 2015-04-03: qty 0.5

## 2015-04-03 MED ORDER — IOHEXOL 300 MG/ML  SOLN: 25.0000 mL | INTRAMUSCULAR | Status: AC

## 2015-04-03 MED ORDER — AMOXICILLIN 500 MG PO CAPS
500.0000 mg | ORAL_CAPSULE | Freq: Three times a day (TID) | ORAL | Status: DC
  Filled 2015-04-03 (×7): qty 1

## 2015-04-03 NOTE — Progress Notes (Signed)
Initial Nutrition Assessment  DOCUMENTATION CODES:   Obesity unspecified  INTERVENTION:   -RD will follow for diet advancement and supplement diet as appropriate  NUTRITION DIAGNOSIS:   Inadequate oral intake related to altered GI function as evidenced by NPO status.  GOAL:   Patient will meet greater than or equal to 90% of their needs  MONITOR:   PO intake, Diet advancement, Labs, Weight trends, Skin, I & O's  REASON FOR ASSESSMENT:   Malnutrition Screening Tool    ASSESSMENT:   [name] is a 44 y.o. female history of gastroparesis, asthma and chronic pain presents to the ER because of recurrent episodes of nausea vomiting and diarrhea as morning. Patient's pain has been generalized all over the abdomen. Patient states she has had recurrent episodes of diarrhea and vomiting which were nonbloody. Patient was placed on amoxicillin yesterday but patient's dentist for tooth infection. Patient took one dose of it. On exam patient's abdomen is soft but tender generalized with no guarding or rigidity or any peritoneal signs. Acute abdominal series is unremarkable. Patient has been admitted for further management.   Pt admitted with nausea, vomiting, and abdominal pain. Per MD, questionable colitis. C-diff panel has been ordered.  Hx obtained by pt and pt daughter at bedside. Both revealsongoing weight loss and appetite since gastroparesis diagnosis approximately 1-2 years ago. Per pt, she is has been unable to keep anything down since Saturday. She shares with this RD that she vomited clear liquids and PO meds this AM and was made NPO.   Pt daughter reports that pt used to be 250-300# several years ago, but has consistently lost weight over the past 1-2 years. Pt reports UBW around 170#, which is consistent with wt hx (UBW 165-170# per previous wt documentation).   Nutrition-Focused physical exam completed. Findings are no fat depletion, no muscle depletion, and no edema.   Pt  expressed frustration with being unable to keep food down. This RD discussed potential for slow diet advancement, starting with clear liquids. Offered supplements with diet advancement, however, pt refused, stating "they make me sick". Noted pt refused Resource [employer]reeze supplement.  Diet Order:  Diet NPO time specified Except for: Sips with Meds  Skin:  Reviewed, no issues  Last BM:  04/02/15  Height:   Ht Readings from Last 1 Encounters:  04/02/15 5\' 2"  (1.575 m)    Weight:   Wt Readings from Last 1 Encounters:  04/02/15 165 lb (74.844 kg)    Ideal Body Weight:  50 kg  BMI:  Body mass index is 30.17 kg/(m^2).  Estimated Nutritional Needs:   Kcal:  1650-1850  Protein:  80-90 grams  Fluid:  1.6-1.8 L  EDUCATION NEEDS:   Education needs addressed  [name] A. [name], RD, LDN, CDE Pager: [phone] After hours Pager: [phone]

## 2015-04-03 NOTE — Progress Notes (Signed)
Utilization Review completed. Marialuiza Car RN BSN CM 

## 2015-04-03 NOTE — Progress Notes (Signed)
Attempted to give po meds today multiple times, patient refused. Also, refusing contrast at this time. Will continue to monitor.

## 2015-04-03 NOTE — Care Management Note (Signed)
Case Management Note  Patient Details  Name: [name] MRN: [id number] Date of Birth: 1971/06/26  Subjective/Objective:                 Patient from home; self care, lives with daughters. Has history of gastroparesis. Patient denied any barriers to caring for self, or any HH DME needs.    Action/Plan:  Will follow, no needs anticipated at this time.   Expected Discharge Date:                  Expected Discharge Plan:  Home/Self Care  In-House Referral:     Discharge planning Services  CM Consult  Post Acute Care Choice:    Choice offered to:     DME Arranged:    DME Agency:     HH Arranged:    HH Agency:     Status of Service:  In process, will continue to follow  Medicare Important Message Given:    Date Medicare IM Given:    Medicare IM give by:    Date Additional Medicare IM Given:    Additional Medicare Important Message give by:     If discussed at [place] of Stay Meetings, dates discussed:    Additional Comments:  Carles Collet, RN 04/03/2015, 12:14 PM

## 2015-04-03 NOTE — Progress Notes (Signed)
Pt arrived to unit sleepy and oriented x4. Oriented to room, unit, and staff.  Bed in lowest position and call bell is within reach. Will continue to monitor.

## 2015-04-03 NOTE — Progress Notes (Signed)
[facility] PROGRESS NOTE  MIKINZIE MACIEJEWSKI WYO:[id number] DOB: 09/17/1970 DOA: 04/02/2015 PCP: Donnie Coffin, MD  Assessment/Plan: #1 nausea/ vomiting/ abdominal pain/ diarrhea Questionable etiology. Viral versus bacterial enteritis. Doubt if secondary to gastroparesis patient stated she was having watery diarrhea. Patient now with a leukocytosis. Will get a CT abdomen and pelvis. Place patient empirically on IV ciprofloxacin and IV Flagyl. Stool studies pending. Follow.  #2 Leukocytosis Questionable etiology. Patient with nausea vomiting abdominal pain and diarrhea. Stool studies pending. Acute abdominal series is negative. Check a UA with cultures and sensitivities. Check a CT of the abdomen and pelvis. Will place empirically on IV ciprofloxacin and IV Flagyl.  #3 chronic low back pain Continue home pain regimen.  #4 history of asthma Stable. Continue Symbicort.  #5 hypertension Stable. Continue verapamil.  #6 prophylaxis PPI for GI prophylaxis. Lovenox for DVT prophylaxis.  Code Status: Full Family Communication: Updated patient and daughter at bedside. Disposition Plan: Home when no further nausea vomiting or diarrhea and tolerating oral intake.   Consultants:  None  Procedures:  Acute abdominal series 04/02/2015    Antibiotics:  IV ciprofloxacin 04/03/2015  IV Flagyl 04/03/2015  HPI/Subjective: Patient with nausea and some emesis in the emergency room prior to admission. Patient with no further diarrhea in-house. Patient with complaints of abdominal pain.patient is currently afebrile. Acute abdominal series was negative. Stool studies pending. Patient unable to tolerate any oral intake.   Objective: Filed Vitals:   04/03/15 1543  BP: 153/85  Pulse: 100  Temp: 99.3 F (37.4 C)  Resp: 18    Intake/Output Summary (Last 24 hours) at 04/03/15 1720 Last data filed at 04/03/15 1354  Gross per 24 hour  Intake 548.75 ml  Output   1200 ml  Net -651.25 ml    Filed Weights   04/02/15 1349  Weight: 74.844 kg (165 lb)    Exam:   General:  NAD  Cardiovascular: TACHYCARDIA  Respiratory: CTAB  Abdomen: Soft/diffuse tenderness to palpation/nondistended/positive bowel sounds  Musculoskeletal: No c/c/e  Data Reviewed: Basic Metabolic Panel:  Recent Labs Lab 04/02/15 1427 04/03/15 0715  NA 139 139  K 3.8 3.9  CL 104 108  CO2 24 20*  GLUCOSE 114* 117*  BUN 6 <5*  CREATININE 0.76 0.70  CALCIUM 8.8* 9.4  MG  --  1.7   Liver Function Tests:  Recent Labs Lab 04/02/15 1427 04/03/15 0715  AST 22 21  ALT 13* 15  ALKPHOS 52 62  BILITOT 0.6 0.8  PROT 7.6 7.4  ALBUMIN 4.0 4.1    Recent Labs Lab 04/02/15 1427  LIPASE 20*   No results for input(s): AMMONIA in the last 168 hours. CBC:  Recent Labs Lab 04/02/15 1427 04/03/15 0715  WBC 10.3 15.2*  NEUTROABS  --  13.3*  HGB 14.3 14.2  HCT 40.9 40.8  MCV 81.3 80.6  PLT 252 253   Cardiac Enzymes: No results for input(s): CKTOTAL, CKMB, CKMBINDEX, TROPONINI in the last 168 hours. BNP (last 3 results) No results for input(s): BNP in the last 8760 hours.  ProBNP (last 3 results) No results for input(s): PROBNP in the last 8760 hours.  CBG: No results for input(s): GLUCAP in the last 168 hours.  No results found for this or any previous visit (from the past 240 hour(s)).   Studies: Dg Abd Acute W/chest  04/02/2015   CLINICAL DATA:  Abdominal pain for 1 day.  EXAM: DG ABDOMEN ACUTE W/ 1V CHEST  COMPARISON:  CT 10/19/2013  FINDINGS: The  cardiomediastinal contours are normal. The lungs are clear. There is no free intra-abdominal air. No dilated bowel loops to suggest obstruction. Scattered air throughout normal caliber small bowel loops in the right abdomen, there is otherwise a paucity of bowel gas. Surgical clips in the right upper quadrant of the abdomen from cholecystectomy. Post-surgical change in the lower lumbar spine. No radiopaque calculi. No acute osseous  abnormalities are seen.  IMPRESSION: 1. No radiographic findings of bowel obstruction or free air. 2. Air within normal caliber small bowel loops in the right abdomen, there is otherwise a paucity of bowel gas.   Electronically Signed   By: Jeb Levering M.D.   On: 04/02/2015 21:57    Scheduled Meds: . amoxicillin  500 mg Oral 3 times per day  . amphetamine-dextroamphetamine  20 mg Oral Q breakfast  . amphetamine-dextroamphetamine  5 mg Oral Daily  . budesonide-formoterol  2 puff Inhalation BID  . ciprofloxacin  400 mg Intravenous Q12H  . cycloSPORINE  1 drop Both Eyes BID  . enoxaparin (LOVENOX) injection  40 mg Subcutaneous Daily  . fentaNYL  75 mcg Transdermal Q48H  . fesoterodine  8 mg Oral Daily  . gabapentin  300 mg Oral TID  . Linaclotide  290 mcg Oral Daily  . loteprednol  1 drop Both Eyes Daily  . lurasidone  40 mg Oral QHS  . metronidazole  500 mg Intravenous Q8H  . pantoprazole (PROTONIX) IV  40 mg Intravenous Q24H  . [START ON 04/04/2015] pneumococcal 23 valent vaccine  0.5 mL Intramuscular Tomorrow-1000  . pramipexole  1 mg Oral QHS  . saccharomyces boulardii  250 mg Oral BID  . tiZANidine  4 mg Oral BID  . topiramate  100 mg Oral Daily   And  . topiramate  200 mg Oral QHS  . verapamil  240 mg Oral QHS   Continuous Infusions: . sodium chloride 0.9 % 1,000 mL infusion 125 mL/hr at 04/03/15 1300    Principal Problem:   Nausea vomiting and diarrhea Active Problems:   Chronic lower back pain   Abdominal pain   Extrinsic asthma   Leukocytosis    Time spent: 40 mins    [facility] MD [facility] Pager [phone]. If 7PM-7AM, please contact night-coverage at [email or web address], password [facility] 04/03/2015, 5:20 PM  LOS: 1 day

## 2015-04-04 DIAGNOSIS — K529 Noninfective gastroenteritis and colitis, unspecified: Secondary | ICD-10-CM | POA: Diagnosis present

## 2015-04-04 DIAGNOSIS — R112 Nausea with vomiting, unspecified: Secondary | ICD-10-CM | POA: Diagnosis present

## 2015-04-04 DIAGNOSIS — K047 Periapical abscess without sinus: Secondary | ICD-10-CM

## 2015-04-04 LAB — CBC WITH DIFFERENTIAL/PLATELET
Basophils Absolute: 0 10*3/uL (ref 0.0–0.1)
Basophils Relative: 0 % (ref 0–1)
Eosinophils Absolute: 0 10*3/uL (ref 0.0–0.7)
Eosinophils Relative: 0 % (ref 0–5)
HCT: 33.4 % — ABNORMAL LOW (ref 36.0–46.0)
Hemoglobin: 11.7 g/dL — ABNORMAL LOW (ref 12.0–15.0)
LYMPHS ABS: 2.8 10*3/uL (ref 0.7–4.0)
Lymphocytes Relative: 32 % (ref 12–46)
MCH: 28.1 pg (ref 26.0–34.0)
MCHC: 35 g/dL (ref 30.0–36.0)
MCV: 80.3 fL (ref 78.0–100.0)
Monocytes Absolute: 0.6 10*3/uL (ref 0.1–1.0)
Monocytes Relative: 6 % (ref 3–12)
Neutro Abs: 5.5 10*3/uL (ref 1.7–7.7)
Neutrophils Relative %: 62 % (ref 43–77)
PLATELETS: 231 10*3/uL (ref 150–400)
RBC: 4.16 MIL/uL (ref 3.87–5.11)
RDW: 13.6 % (ref 11.5–15.5)
WBC: 8.8 10*3/uL (ref 4.0–10.5)

## 2015-04-04 LAB — COMPREHENSIVE METABOLIC PANEL
ALBUMIN: 3.3 g/dL — AB (ref 3.5–5.0)
ALT: 11 U/L — ABNORMAL LOW (ref 14–54)
ANION GAP: 6 (ref 5–15)
AST: 14 U/L — ABNORMAL LOW (ref 15–41)
Alkaline Phosphatase: 45 U/L (ref 38–126)
BILIRUBIN TOTAL: 1.2 mg/dL (ref 0.3–1.2)
BUN: 10 mg/dL (ref 6–20)
CALCIUM: 8.6 mg/dL — AB (ref 8.9–10.3)
CO2: 21 mmol/L — AB (ref 22–32)
Chloride: 112 mmol/L — ABNORMAL HIGH (ref 101–111)
Creatinine, Ser: 0.84 mg/dL (ref 0.44–1.00)
GFR calc Af Amer: >60 mL/min (ref >60)
GFR calc non Af Amer: >60 mL/min (ref >60)
Glucose, Bld: 89 mg/dL (ref 65–99)
POTASSIUM: 4.2 mmol/L (ref 3.5–5.1)
Sodium: 139 mmol/L (ref 135–145)
TOTAL PROTEIN: 6.1 g/dL — AB (ref 6.5–8.1)

## 2015-04-04 LAB — MAGNESIUM: MAGNESIUM: 2.2 mg/dL (ref 1.7–2.4)

## 2015-04-04 MED ORDER — ESTRADIOL 0.1 MG/24HR TD PTWK
0.1000 mg | MEDICATED_PATCH | TRANSDERMAL | Status: DC
  Administered 2015-04-07 – 2015-04-10 (×2): 0.1 mg via TRANSDERMAL
  Filled 2015-04-04 (×2): qty 1

## 2015-04-04 MED ORDER — SODIUM CHLORIDE 0.9 % IV BOLUS (SEPSIS)
500.0000 mL | Freq: Once | INTRAVENOUS | Status: AC
  Administered 2015-04-04: 500 mL via INTRAVENOUS

## 2015-04-04 MED ORDER — SODIUM CHLORIDE 0.9 % IV SOLN
1.5000 g | Freq: Four times a day (QID) | INTRAVENOUS | Status: DC
  Administered 2015-04-04 – 2015-04-07 (×11): 1.5 g via INTRAVENOUS
  Filled 2015-04-04 (×15): qty 1.5

## 2015-04-04 MED ORDER — METOCLOPRAMIDE HCL 5 MG/ML IJ SOLN
10.0000 mg | Freq: Four times a day (QID) | INTRAMUSCULAR | Status: DC
  Administered 2015-04-04 – 2015-04-10 (×23): 10 mg via INTRAVENOUS
  Filled 2015-04-04 (×23): qty 2

## 2015-04-04 NOTE — Progress Notes (Signed)
[facility] PROGRESS NOTE  [name] ERD:[id number] DOB: December 02, 1970 DOA: 04/02/2015 PCP: Donnie Coffin, MD  Assessment/Plan: #1 acute colitis/nausea, emesis,abd pain, diarrhea Patient with acute colitis noted on CT of the abdomen and pelvis. Patient had a leukocytosis. Patient with no bowel movement today patient denies any emesis. Patient endorses nausea. Patient tolerating clear liquids. Continue empiric IV Flagyl. Change IV ciprofloxacin to IV Unasyn secondary to interaction between fluoroquinolones and Zanaflex. Consult with GI for further evaluation and management.  #2 Leukocytosis Likely secondary to acute colitis. Patient with nausea vomiting abdominal pain and diarrhea. Stool studies pending. Acute abdominal series is negative. CT of the abdomen and pelvis consistent with descending colitis. Continue IV Flagyl. Change IV ciprofloxacin to IV Unasyn secondary to interaction between ciprofloxacin and zanaflex. Supportive care.  #3 chronic low back pain Continue home pain regimen.  #4 history of asthma Stable. Continue Symbicort.  #5 hypertension Blood pressure is borderline. Discontinue verapamil.  #6 dental infection Patient was on amoxicillin prior to admission. DC amoxicillin. Patient on IV Unasyn.  #6 prophylaxis PPI for GI prophylaxis. Lovenox for DVT prophylaxis.  Code Status: Full Family Communication: Updated patient and husband at bedside. Disposition Plan: Home when no further nausea vomiting or diarrhea and tolerating oral intake.   Consultants:  Gastroenterology pending  Procedures:  Acute abdominal series 04/02/2015  CT abdomen and pelvis 04/03/2015  Antibiotics:  IV ciprofloxacin 04/03/2015>>>>>> 04/04/2015  IV Flagyl 04/03/2015  IV Unasyn 04/04/2015  HPI/Subjective: Patient states she's feeling a little bit better than she did yesterday. Patient states no change in abdominal pain. Patient denies any emesis. Patient with some nausea.  Patient denies any bowel movement today. Patient able to tolerate some ginger ale today.   Objective: Filed Vitals:   04/04/15 0516  BP: 96/44  Pulse: 69  Temp: 98.3 F (36.8 C)  Resp: 18    Intake/Output Summary (Last 24 hours) at 04/04/15 1154 Last data filed at 04/04/15 0636  Gross per 24 hour  Intake    600 ml  Output   1400 ml  Net   -800 ml   Filed Weights   04/02/15 1349  Weight: 74.844 kg (165 lb)    Exam:   General:  NAD  Cardiovascular: RRR  Respiratory: CTAB  Abdomen: Soft/diffuse tenderness to palpation/nondistended/positive bowel sounds  Musculoskeletal: No c/c/e  Data Reviewed: Basic Metabolic Panel:  Recent Labs Lab 04/02/15 1427 04/03/15 0715 04/04/15 0644  NA 139 139 139  K 3.8 3.9 4.2  CL 104 108 112*  CO2 24 20* 21*  GLUCOSE 114* 117* 89  BUN 6 <5* 10  CREATININE 0.76 0.70 0.84  CALCIUM 8.8* 9.4 8.6*  MG  --  1.7 2.2   Liver Function Tests:  Recent Labs Lab 04/02/15 1427 04/03/15 0715 04/04/15 0644  AST 22 21 14*  ALT 13* 15 11*  ALKPHOS 52 62 45  BILITOT 0.6 0.8 1.2  PROT 7.6 7.4 6.1*  ALBUMIN 4.0 4.1 3.3*    Recent Labs Lab 04/02/15 1427  LIPASE 20*   No results for input(s): AMMONIA in the last 168 hours. CBC:  Recent Labs Lab 04/02/15 1427 04/03/15 0715 04/04/15 0644  WBC 10.3 15.2* 8.8  NEUTROABS  --  13.3* 5.5  HGB 14.3 14.2 11.7*  HCT 40.9 40.8 33.4*  MCV 81.3 80.6 80.3  PLT 252 253 231   Cardiac Enzymes: No results for input(s): CKTOTAL, CKMB, CKMBINDEX, TROPONINI in the last 168 hours. BNP (last 3 results) No results for input(s): BNP  in the last 8760 hours.  ProBNP (last 3 results) No results for input(s): PROBNP in the last 8760 hours.  CBG: No results for input(s): GLUCAP in the last 168 hours.  No results found for this or any previous visit (from the past 240 hour(s)).   Studies: Ct Abdomen Pelvis W Contrast  04/03/2015   CLINICAL DATA:  Abdominal pain with nausea, vomiting, and  diarrhea.  EXAM: CT ABDOMEN AND PELVIS WITH CONTRAST  TECHNIQUE: Multidetector CT imaging of the abdomen and pelvis was performed using the standard protocol following bolus administration of intravenous contrast.  CONTRAST:  84 cc OMNIPAQUE IOHEXOL 300 MG/ML  SOLN  COMPARISON:  Radiographs dated 04/02/2015 and CT scan dated 10/19/2013  FINDINGS: Lower chest:  Normal.  Hepatobiliary: Cholecystectomy.  Otherwise normal.  Pancreas: Normal.  Spleen: Normal.  Adrenals/Urinary Tract: Normal.  Stomach/Bowel: There is subtle edema and slight soft tissue stranding around the descending colon consistent with colitis. The ascending and transverse portions of the colon appear normal. Small bowel is normal. Appendix is normal.  Stomach appears normal.  Vascular/Lymphatic: Normal.  Reproductive: Uterus and ovaries appear to have been removed.  Other: Tiny amount of free fluid in the pelvis.  No free air.  Musculoskeletal: No acute abnormality.  Prior lower lumbar surgery.  IMPRESSION: Edema of the mucosa of the descending colon with slight soft tissue stranding around the portion of the colon consistent with colitis.   Electronically Signed   By: Lorriane Shire M.D.   On: 04/03/2015 21:48   Dg Abd Acute W/chest  04/02/2015   CLINICAL DATA:  Abdominal pain for 1 day.  EXAM: DG ABDOMEN ACUTE W/ 1V CHEST  COMPARISON:  CT 10/19/2013  FINDINGS: The cardiomediastinal contours are normal. The lungs are clear. There is no free intra-abdominal air. No dilated bowel loops to suggest obstruction. Scattered air throughout normal caliber small bowel loops in the right abdomen, there is otherwise a paucity of bowel gas. Surgical clips in the right upper quadrant of the abdomen from cholecystectomy. Post-surgical change in the lower lumbar spine. No radiopaque calculi. No acute osseous abnormalities are seen.  IMPRESSION: 1. No radiographic findings of bowel obstruction or free air. 2. Air within normal caliber small bowel loops in the  right abdomen, there is otherwise a paucity of bowel gas.   Electronically Signed   By: Jeb Levering M.D.   On: 04/02/2015 21:57    Scheduled Meds: . amphetamine-dextroamphetamine  20 mg Oral Q breakfast  . amphetamine-dextroamphetamine  5 mg Oral Daily  . budesonide-formoterol  2 puff Inhalation BID  . ciprofloxacin  400 mg Intravenous Q12H  . cycloSPORINE  1 drop Both Eyes BID  . enoxaparin (LOVENOX) injection  40 mg Subcutaneous Daily  . [START ON 04/07/2015] estradiol  0.1 mg Transdermal Once per day on Mon Thu  . fentaNYL  75 mcg Transdermal Q48H  . fesoterodine  8 mg Oral Daily  . gabapentin  300 mg Oral TID  . Linaclotide  290 mcg Oral Daily  . loteprednol  1 drop Both Eyes Daily  . lurasidone  40 mg Oral QHS  . metronidazole  500 mg Intravenous Q8H  . pantoprazole (PROTONIX) IV  40 mg Intravenous Q24H  . pneumococcal 23 valent vaccine  0.5 mL Intramuscular Tomorrow-1000  . pramipexole  1 mg Oral QHS  . saccharomyces boulardii  250 mg Oral BID  . tiZANidine  4 mg Oral BID  . topiramate  100 mg Oral Daily   And  .  topiramate  200 mg Oral QHS  . verapamil  240 mg Oral QHS   Continuous Infusions: . sodium chloride 0.9 % 1,000 mL infusion 125 mL/hr at 04/03/15 2143    Principal Problem:   Colitis, acute Active Problems:   Chronic lower back pain   Nausea vomiting and diarrhea   Abdominal pain   Extrinsic asthma   Leukocytosis   Dental infection    Time spent: 40 mins    [facility] MD [facility] Pager [phone]. If 7PM-7AM, please contact night-coverage at [email or web address], password [facility] 04/04/2015, 11:54 AM  LOS: 2 days

## 2015-04-04 NOTE — Consult Note (Signed)
[facility] Consult Note  Referring Provider: No ref. provider found Primary Care Physician:  Donnie Coffin, MD Primary Gastroenterologist:  Dr.  Laurel Dimmer Complaint: Nausea vomiting and diarrhea HPI: [name] is an 44 y.o. black female  with a history of chronic GI problems with apparent idiopathic gastroparesis presents with refractory nausea vomiting and diarrhea. She states this is similar to previous episodes and chronic symptom she has to a lesser degree at home. She had a gastric emptying study after an extensive GI workup including negative EGD and colonoscopy. Apparently the gastric emptying study in 2015 showed 80% retention at 1 hour and 67% at 2 hours. She was started on Reglan and this did not help. She was sent to [facility] gastroenterology where she was taken off of the Reglan and had a pyloric channel dilatation which did not help. Later earlier this year she had a Botox injection of the pylorus states that it really hasn't helped either. When asked she states she has chronic blood in her stool on a regular basis. However her colonoscopy in 2015 was normal. She denies any recent antibodies or recent travel. CT scan this admission showed some thickening of the descending colon suggestive of possible colitis.  Past Medical History  Diagnosis Date  . Hypertension   . Overactive bladder   . Esophageal reflux   . Migraine   . Psychosis   . Schizophrenia, schizo-affective   . OCD (obsessive compulsive disorder)   . Bipolar 1 disorder   . Restless leg syndrome   . S/P lumbar fusion 2011  . Torn rotator cuff it:2012,rt:2013  . Migraines   . Depression     excessive daytime sleepiness-MSLT confirmed pathological degree of hypersomnia-12/13  . Anxiety   . Narcolepsy   . Migraine   . Hypersomnia   . Asthma   . Sleep apnea     uses CPAP every night  . Seasonal allergies   . Schizophrenia   . Osteoarthritis     knees, hands, shoulder, lower back  . Chronic  lower back pain 02/07/2014  . Medication monitoring encounter 02/07/2014  . Gastroparesis     Past Surgical History  Procedure Laterality Date  . Vesicovaginal fistula closure w/ tah  2001  . Tubal ligation  2004  . Shoulder surgery      left and right , bone spurs, torn muscle  . Abdominal hysterectomy  2001  . Spurs,ruptured disc bone  2009  . Lumbar fusion  2011    L4-L5  . Rotator cuff repair  2012,2013    torn   . Oophorectomy   2004    right  . Ankle fusion      right  . Cholecystectomy N/A 07/20/2013    Procedure: LAPAROSCOPIC CHOLECYSTECTOMY;  Surgeon: Harl Bowie, MD;  Location: [place];  Service: General;  Laterality: N/A;  . Colonoscopy with propofol N/A 10/02/2013    Procedure: COLONOSCOPY WITH PROPOFOL;  Surgeon: Garlan Fair, MD;  Location: [facility];  Service: Endoscopy;  Laterality: N/A;  . Esophagogastroduodenoscopy (egd) with propofol N/A 10/02/2013    Procedure: ESOPHAGOGASTRODUODENOSCOPY (EGD) WITH PROPOFOL;  Surgeon: Garlan Fair, MD;  Location: [facility];  Service: Endoscopy;  Laterality: N/A;  . Wisdom tooth extraction    . Laparoscopic salpingo oopherectomy Left 12/05/2013    Procedure: LAPAROSCOPIC SALPINGO OOPHORECTOMY;  Surgeon: Thurnell Lose, MD;  Location: [facility];  Service: Gynecology;  Laterality: Left;    Medications Prior to Admission  Medication Sig Dispense Refill  .  amphetamine-dextroamphetamine (ADDERALL XR) 20 MG 24 hr capsule Take 1 capsule (20 mg total) by mouth daily. 30 capsule 0  . amphetamine-dextroamphetamine (ADDERALL) 5 MG tablet Take 1 tablet daily at 1PM. 30 tablet 0  . budesonide-formoterol (SYMBICORT) 160-4.5 MCG/ACT inhaler Inhale 2 puffs into the lungs 2 (two) times daily.      . Clobetasol Propionate (CLOBEX) 0.05 % shampoo Apply 1 application topically every Wednesday.     . cycloSPORINE (RESTASIS) 0.05 % ophthalmic emulsion Place 1 drop into both eyes every 12 (twelve) hours.      Marland Kitchen dexlansoprazole (DEXILANT) 60  MG capsule Take 60 mg by mouth daily.    . fentaNYL (DURAGESIC - DOSED MCG/HR) 75 MCG/HR Place 75 mcg onto the skin every other day.    . fesoterodine (TOVIAZ) 8 MG TB24 tablet Take 8 mg by mouth daily.    Marland Kitchen FLUOCINOLONE ACETONIDE SCALP 0.01 % OIL Apply 1 application topically daily.  0  . fluocinonide (LIDEX) 0.05 % cream Apply 1 application topically 3 (three) times a week.     . gabapentin (NEURONTIN) 300 MG capsule Take 300 mg by mouth 3 (three) times daily.     . hydrochlorothiazide (HYDRODIURIL) 25 MG tablet Take 25 mg by mouth every morning.     Marland Kitchen HYPERCARE 20 % external solution Apply 1 application topically daily.     Marland Kitchen LINZESS 290 MCG CAPS capsule Take 290 mcg by mouth daily.  11  . loteprednol (LOTEMAX) 0.2 % SUSP Place 1 drop into both eyes daily.    Marland Kitchen lurasidone (LATUDA) 40 MG TABS Take 40 mg by mouth at bedtime.     . meloxicam (MOBIC) 15 MG tablet Take 15 mg by mouth daily.    Marland Kitchen MINIVELLE 0.1 MG/24HR patch Apply 1 patch topically 2 (two) times a week.  12  . Olopatadine HCl (PATADAY) 0.2 % SOLN Place 1 drop into both eyes 2 (two) times daily as needed (for allergies).     . OnabotulinumtoxinA (BOTOX IJ) Inject as directed every 3 (three) months.    . pantoprazole (PROTONIX) 20 MG tablet Take 20 mg by mouth daily.    . pramipexole (MIRAPEX) 1 MG tablet Take 1 mg by mouth at bedtime.  0  . promethazine (PHENERGAN) 25 MG tablet Take 25 mg by mouth every 6 (six) hours as needed for nausea or vomiting.   3  . RELPAX 40 MG tablet Take 40 mg by mouth every 2 (two) hours as needed for migraine or headache.     Marland Kitchen tiZANidine (ZANAFLEX) 4 MG tablet Take 4 mg by mouth 2 (two) times daily.     Marland Kitchen topiramate (TOPAMAX) 100 MG tablet Take 100-200 mg by mouth 2 (two) times daily. 1 tab every AM and 2 at night    . triamcinolone cream (KENALOG) 0.1 % Apply 1 application topically daily as needed (to affected area).     . verapamil (VERELAN PM) 240 MG 24 hr capsule Take 240 mg by mouth at bedtime.         Allergies:  Allergies  Allergen Reactions  . Adhesive [Tape] Rash    Glue from steri strips  . Aspirin Nausea Only  . Benadryl [Diphenhydramine Hcl] Other (See Comments)    Makes her hyper, jittery  . Codeine Nausea And Vomiting  . Dust Mite Extract Other (See Comments)    Cough, sneeze, eyes runny  . Mold Extract [Trichophyton] Other (See Comments)    Cough, sneeze, eyes/nose runny  . Pollen Extract  Other (See Comments)    Cough, sneeze, eyes/nose runny  . Prednisone Other (See Comments)    Makes her hyper, jittery   . Zofran [Ondansetron] Nausea And Vomiting    Family History  Problem Relation Age of Onset  . Cancer Mother     breast  . Cancer Father     prostate,colon  . Hypertension Father   . Thyroid disease Father   . High Cholesterol Father   . Thyroid disease Sister   . Cancer Paternal Aunt     colon  . Cancer Maternal Grandmother     kidney  . Cancer Paternal Grandmother     colon  . Cancer Paternal Aunt     lung  . Narcolepsy Neg Hx     Social History:  reports that she has never smoked. She has never used smokeless tobacco. She reports that she does not drink alcohol or use illicit drugs.  Review of Systems: negative except as above   Blood pressure 84/32, pulse 78, temperature 98.5 F (36.9 C), temperature source Oral, resp. rate 15, height $RemoveBe'5\' 2"'ZBhepgYdB$  (1.575 m), weight 74.844 kg (165 lb), SpO2 97 %. Head: Normocephalic, without obvious abnormality, atraumatic Neck: no adenopathy, no carotid bruit, no JVD, supple, symmetrical, trachea midline and thyroid not enlarged, symmetric, no tenderness/mass/nodules Resp: clear to auscultation bilaterally Cardio: regular rate and rhythm, S1, S2 normal, no murmur, click, rub or gallop GI: Abdomen soft distended with normoactive bowel sounds no hepatosplenomegaly mass or guarding mild left lower quadrant tenderness. Extremities: extremities normal, atraumatic, no cyanosis or edema  Results for orders placed or  performed during the hospital encounter of 04/02/15 (from the past 48 hour(s))  Urine rapid drug screen (hosp performed)     Status: None   Collection Time: 04/02/15 11:04 PM  Result Value Ref Range   Opiates NONE DETECTED NONE DETECTED   Cocaine NONE DETECTED NONE DETECTED   Benzodiazepines NONE DETECTED NONE DETECTED   Amphetamines NONE DETECTED NONE DETECTED   Tetrahydrocannabinol NONE DETECTED NONE DETECTED   Barbiturates NONE DETECTED NONE DETECTED    Comment:        DRUG SCREEN FOR MEDICAL PURPOSES ONLY.  IF CONFIRMATION IS NEEDED FOR ANY PURPOSE, NOTIFY LAB WITHIN 5 DAYS.        LOWEST DETECTABLE LIMITS FOR URINE DRUG SCREEN Drug Class       Cutoff (ng/mL) Amphetamine      1000 Barbiturate      200 Benzodiazepine   707 Tricyclics       867 Opiates          300 Cocaine          300 THC              50   Comprehensive metabolic panel     Status: Abnormal   Collection Time: 04/03/15  7:15 AM  Result Value Ref Range   Sodium 139 135 - 145 mmol/L   Potassium 3.9 3.5 - 5.1 mmol/L   Chloride 108 101 - 111 mmol/L   CO2 20 (L) 22 - 32 mmol/L   Glucose, Bld 117 (H) 65 - 99 mg/dL   BUN <5 (L) 6 - 20 mg/dL   Creatinine, Ser 0.70 0.44 - 1.00 mg/dL   Calcium 9.4 8.9 - 10.3 mg/dL   Total Protein 7.4 6.5 - 8.1 g/dL   Albumin 4.1 3.5 - 5.0 g/dL   AST 21 15 - 41 U/L   ALT 15 14 - 54 U/L   Alkaline  Phosphatase 62 38 - 126 U/L   Total Bilirubin 0.8 0.3 - 1.2 mg/dL   GFR calc non Af Amer >60 >60 mL/min   GFR calc Af Amer >60 >60 mL/min    Comment: (NOTE) The eGFR has been calculated using the CKD EPI equation. This calculation has not been validated in all clinical situations. eGFR's persistently <60 mL/min signify possible Chronic Kidney Disease.    Anion gap 11 5 - 15  CBC WITH DIFFERENTIAL     Status: Abnormal   Collection Time: 04/03/15  7:15 AM  Result Value Ref Range   WBC 15.2 (H) 4.0 - 10.5 K/uL   RBC 5.06 3.87 - 5.11 MIL/uL   Hemoglobin 14.2 12.0 - 15.0 g/dL    HCT 40.8 36.0 - 46.0 %   MCV 80.6 78.0 - 100.0 fL   MCH 28.1 26.0 - 34.0 pg   MCHC 34.8 30.0 - 36.0 g/dL   RDW 13.5 11.5 - 15.5 %   Platelets 253 150 - 400 K/uL   Neutrophils Relative % 87 (H) 43 - 77 %   Neutro Abs 13.3 (H) 1.7 - 7.7 K/uL   Lymphocytes Relative 10 (L) 12 - 46 %   Lymphs Abs 1.5 0.7 - 4.0 K/uL   Monocytes Relative 3 3 - 12 %   Monocytes Absolute 0.5 0.1 - 1.0 K/uL   Eosinophils Relative 0 0 - 5 %   Eosinophils Absolute 0.0 0.0 - 0.7 K/uL   Basophils Relative 0 0 - 1 %   Basophils Absolute 0.0 0.0 - 0.1 K/uL  Magnesium     Status: None   Collection Time: 04/03/15  7:15 AM  Result Value Ref Range   Magnesium 1.7 1.7 - 2.4 mg/dL  CBC with Differential/Platelet     Status: Abnormal   Collection Time: 04/04/15  6:44 AM  Result Value Ref Range   WBC 8.8 4.0 - 10.5 K/uL   RBC 4.16 3.87 - 5.11 MIL/uL   Hemoglobin 11.7 (L) 12.0 - 15.0 g/dL   HCT 33.4 (L) 36.0 - 46.0 %   MCV 80.3 78.0 - 100.0 fL   MCH 28.1 26.0 - 34.0 pg   MCHC 35.0 30.0 - 36.0 g/dL   RDW 13.6 11.5 - 15.5 %   Platelets 231 150 - 400 K/uL   Neutrophils Relative % 62 43 - 77 %   Neutro Abs 5.5 1.7 - 7.7 K/uL   Lymphocytes Relative 32 12 - 46 %   Lymphs Abs 2.8 0.7 - 4.0 K/uL   Monocytes Relative 6 3 - 12 %   Monocytes Absolute 0.6 0.1 - 1.0 K/uL   Eosinophils Relative 0 0 - 5 %   Eosinophils Absolute 0.0 0.0 - 0.7 K/uL   Basophils Relative 0 0 - 1 %   Basophils Absolute 0.0 0.0 - 0.1 K/uL  Comprehensive metabolic panel     Status: Abnormal   Collection Time: 04/04/15  6:44 AM  Result Value Ref Range   Sodium 139 135 - 145 mmol/L   Potassium 4.2 3.5 - 5.1 mmol/L   Chloride 112 (H) 101 - 111 mmol/L   CO2 21 (L) 22 - 32 mmol/L   Glucose, Bld 89 65 - 99 mg/dL   BUN 10 6 - 20 mg/dL   Creatinine, Ser 0.84 0.44 - 1.00 mg/dL   Calcium 8.6 (L) 8.9 - 10.3 mg/dL   Total Protein 6.1 (L) 6.5 - 8.1 g/dL   Albumin 3.3 (L) 3.5 - 5.0 g/dL   AST 14 (  L) 15 - 41 U/L   ALT 11 (L) 14 - 54 U/L   Alkaline  Phosphatase 45 38 - 126 U/L   Total Bilirubin 1.2 0.3 - 1.2 mg/dL   GFR calc non Af Amer >60 >60 mL/min   GFR calc Af Amer >60 >60 mL/min    Comment: (NOTE) The eGFR has been calculated using the CKD EPI equation. This calculation has not been validated in all clinical situations. eGFR's persistently <60 mL/min signify possible Chronic Kidney Disease.    Anion gap 6 5 - 15  Magnesium     Status: None   Collection Time: 04/04/15  6:44 AM  Result Value Ref Range   Magnesium 2.2 1.7 - 2.4 mg/dL   Ct Abdomen Pelvis W Contrast  04/03/2015   CLINICAL DATA:  Abdominal pain with nausea, vomiting, and diarrhea.  EXAM: CT ABDOMEN AND PELVIS WITH CONTRAST  TECHNIQUE: Multidetector CT imaging of the abdomen and pelvis was performed using the standard protocol following bolus administration of intravenous contrast.  CONTRAST:  84 cc OMNIPAQUE IOHEXOL 300 MG/ML  SOLN  COMPARISON:  Radiographs dated 04/02/2015 and CT scan dated 10/19/2013  FINDINGS: Lower chest:  Normal.  Hepatobiliary: Cholecystectomy.  Otherwise normal.  Pancreas: Normal.  Spleen: Normal.  Adrenals/Urinary Tract: Normal.  Stomach/Bowel: There is subtle edema and slight soft tissue stranding around the descending colon consistent with colitis. The ascending and transverse portions of the colon appear normal. Small bowel is normal. Appendix is normal.  Stomach appears normal.  Vascular/Lymphatic: Normal.  Reproductive: Uterus and ovaries appear to have been removed.  Other: Tiny amount of free fluid in the pelvis.  No free air.  Musculoskeletal: No acute abnormality.  Prior lower lumbar surgery.  IMPRESSION: Edema of the mucosa of the descending colon with slight soft tissue stranding around the portion of the colon consistent with colitis.   Electronically Signed   By: Lorriane Shire M.D.   On: 04/03/2015 21:48   Dg Abd Acute W/chest  04/02/2015   CLINICAL DATA:  Abdominal pain for 1 day.  EXAM: DG ABDOMEN ACUTE W/ 1V CHEST  COMPARISON:  CT  10/19/2013  FINDINGS: The cardiomediastinal contours are normal. The lungs are clear. There is no free intra-abdominal air. No dilated bowel loops to suggest obstruction. Scattered air throughout normal caliber small bowel loops in the right abdomen, there is otherwise a paucity of bowel gas. Surgical clips in the right upper quadrant of the abdomen from cholecystectomy. Post-surgical change in the lower lumbar spine. No radiopaque calculi. No acute osseous abnormalities are seen.  IMPRESSION: 1. No radiographic findings of bowel obstruction or free air. 2. Air within normal caliber small bowel loops in the right abdomen, there is otherwise a paucity of bowel gas.   Electronically Signed   By: Jeb Levering M.D.   On: 04/02/2015 21:57    Assessment: Nausea vomiting and diarrhea unclear whether simply persistence, exacerbation of her gastroparesis versus acute gastroenteritis. CT findings are nonspecific and I'm not sure she really hasn't acute colitis although she reports blood in her stools chronically Plan:  Monitor stools closely for presence of blood Agree with Cipro and Flagyl Will add IV Reglan Continue clear liquid diet for now Await stool studies Consider flexible sigmoidoscopy or colonoscopy if documented bloody stools.  Delynda Sepulveda C 04/04/2015, 3:38 PM  Pager [phone] If no answer or after 5 PM call [phone]

## 2015-04-05 DIAGNOSIS — E1143 Type 2 diabetes mellitus with diabetic autonomic (poly)neuropathy: Secondary | ICD-10-CM | POA: Diagnosis present

## 2015-04-05 DIAGNOSIS — K3184 Gastroparesis: Secondary | ICD-10-CM

## 2015-04-05 LAB — BASIC METABOLIC PANEL
ANION GAP: 3 — AB (ref 5–15)
BUN: 8 mg/dL (ref 6–20)
CHLORIDE: 115 mmol/L — AB (ref 101–111)
CO2: 23 mmol/L (ref 22–32)
Calcium: 8.3 mg/dL — ABNORMAL LOW (ref 8.9–10.3)
Creatinine, Ser: 0.82 mg/dL (ref 0.44–1.00)
Glucose, Bld: 86 mg/dL (ref 65–99)
Potassium: 3.7 mmol/L (ref 3.5–5.1)
Sodium: 141 mmol/L (ref 135–145)

## 2015-04-05 LAB — CBC
HCT: 30.5 % — ABNORMAL LOW (ref 36.0–46.0)
Hemoglobin: 10.5 g/dL — ABNORMAL LOW (ref 12.0–15.0)
MCH: 27.4 pg (ref 26.0–34.0)
MCHC: 34.4 g/dL (ref 30.0–36.0)
MCV: 79.6 fL (ref 78.0–100.0)
Platelets: 203 10*3/uL (ref 150–400)
RBC: 3.83 MIL/uL — AB (ref 3.87–5.11)
RDW: 13.5 % (ref 11.5–15.5)
WBC: 5.8 10*3/uL (ref 4.0–10.5)

## 2015-04-05 LAB — CLOSTRIDIUM DIFFICILE BY PCR: Toxigenic C. Difficile by PCR: NEGATIVE

## 2015-04-05 MED ORDER — SODIUM CHLORIDE 0.9 % IV SOLN: INTRAVENOUS | Status: DC

## 2015-04-05 MED ORDER — DEXTROSE-NACL 5-0.45 % IV SOLN
INTRAVENOUS | Status: DC
  Administered 2015-04-05 – 2015-04-08 (×7): via INTRAVENOUS

## 2015-04-05 NOTE — Progress Notes (Signed)
[facility] PROGRESS NOTE  [name] GBT:[id number] DOB: [birth year] DOA: 04/02/2015 PCP: Donnie Coffin, MD  Assessment/Plan: #1 nausea, emesis,abd pain, diarrhea likely secondary to gastroparesis with underlying enteritis versus colitis Patient with acute colitis noted on CT of the abdomen and pelvis. Patient had a leukocytosis. Patient with no bowel movement today patient denies any emesis. Patient endorses nausea. Patient tolerating clear liquids. Patient tried some full liquids however felt nauseous requesting to be changed back to clear liquids. Patient was done IV Reglan per GI as to most of the issues is secondary to gastroparesis with underlying enteritis. Continue empiric IV Flagyl, IV Unasyn. GI following and appreciate input and recommendations.   #2 Leukocytosis Likely secondary to acute colitis. Patient with nausea vomiting abdominal pain and diarrhea. Stool studies pending. Acute abdominal series is negative. CT of the abdomen and pelvis consistent with descending colitis. Continue IV Flagyl and IV Unasyn. Supportive care.  #3 chronic low back pain Continue home pain regimen.  #4 history of asthma Stable. Continue Symbicort.  #5 hypertension Blood pressure is borderline. Discontinued verapamil.  #6 dental infection Patient was on amoxicillin prior to admission. DC amoxicillin. Patient on IV Unasyn.  #6 prophylaxis PPI for GI prophylaxis. Lovenox for DVT prophylaxis.  Code Status: Full Family Communication: Updated patient and husband at bedside. Disposition Plan: Home when no further nausea vomiting or diarrhea and tolerating oral intake.   Consultants:  Gastroenterology: Dr. Amedeo Plenty 04/04/2015  Procedures:  Acute abdominal series 04/02/2015  CT abdomen and pelvis 04/03/2015  Antibiotics:  IV ciprofloxacin 04/03/2015>>>>>> 04/04/2015  IV Flagyl 04/03/2015  IV Unasyn 04/04/2015  HPI/Subjective: Patient states no emesis. Patient still with  nausea. Patient states no change in abdominal pain.  Objective: Filed Vitals:   04/05/15 1345  BP: 108/61  Pulse: 95  Temp: 98.5 F (36.9 C)  Resp: 19    Intake/Output Summary (Last 24 hours) at 04/05/15 1347 Last data filed at 04/05/15 1208  Gross per 24 hour  Intake   7000 ml  Output   2675 ml  Net   4325 ml   Filed Weights   04/02/15 1349  Weight: 74.844 kg (165 lb)    Exam:   General:  NAD  Cardiovascular: RRR  Respiratory: CTAB  Abdomen: Soft/diffuse tenderness to palpation/nondistended/positive bowel sounds  Musculoskeletal: No c/c/e  Data Reviewed: Basic Metabolic Panel:  Recent Labs Lab 04/02/15 1427 04/03/15 0715 04/04/15 0644 04/05/15 0500  NA 139 139 139 141  K 3.8 3.9 4.2 3.7  CL 104 108 112* 115*  CO2 24 20* 21* 23  GLUCOSE 114* 117* 89 86  BUN 6 <5* 10 8  CREATININE 0.76 0.70 0.84 0.82  CALCIUM 8.8* 9.4 8.6* 8.3*  MG  --  1.7 2.2  --    Liver Function Tests:  Recent Labs Lab 04/02/15 1427 04/03/15 0715 04/04/15 0644  AST 22 21 14*  ALT 13* 15 11*  ALKPHOS 52 62 45  BILITOT 0.6 0.8 1.2  PROT 7.6 7.4 6.1*  ALBUMIN 4.0 4.1 3.3*    Recent Labs Lab 04/02/15 1427  LIPASE 20*   No results for input(s): AMMONIA in the last 168 hours. CBC:  Recent Labs Lab 04/02/15 1427 04/03/15 0715 04/04/15 0644 04/05/15 0500  WBC 10.3 15.2* 8.8 5.8  NEUTROABS  --  13.3* 5.5  --   HGB 14.3 14.2 11.7* 10.5*  HCT 40.9 40.8 33.4* 30.5*  MCV 81.3 80.6 80.3 79.6  PLT 252 253 231 203   Cardiac Enzymes: No results  for input(s): CKTOTAL, CKMB, CKMBINDEX, TROPONINI in the last 168 hours. BNP (last 3 results) No results for input(s): BNP in the last 8760 hours.  ProBNP (last 3 results) No results for input(s): PROBNP in the last 8760 hours.  CBG: No results for input(s): GLUCAP in the last 168 hours.  No results found for this or any previous visit (from the past 240 hour(s)).   Studies: Ct Abdomen Pelvis W Contrast  04/03/2015    CLINICAL DATA:  Abdominal pain with nausea, vomiting, and diarrhea.  EXAM: CT ABDOMEN AND PELVIS WITH CONTRAST  TECHNIQUE: Multidetector CT imaging of the abdomen and pelvis was performed using the standard protocol following bolus administration of intravenous contrast.  CONTRAST:  84 cc OMNIPAQUE IOHEXOL 300 MG/ML  SOLN  COMPARISON:  Radiographs dated 04/02/2015 and CT scan dated 10/19/2013  FINDINGS: Lower chest:  Normal.  Hepatobiliary: Cholecystectomy.  Otherwise normal.  Pancreas: Normal.  Spleen: Normal.  Adrenals/Urinary Tract: Normal.  Stomach/Bowel: There is subtle edema and slight soft tissue stranding around the descending colon consistent with colitis. The ascending and transverse portions of the colon appear normal. Small bowel is normal. Appendix is normal.  Stomach appears normal.  Vascular/Lymphatic: Normal.  Reproductive: Uterus and ovaries appear to have been removed.  Other: Tiny amount of free fluid in the pelvis.  No free air.  Musculoskeletal: No acute abnormality.  Prior lower lumbar surgery.  IMPRESSION: Edema of the mucosa of the descending colon with slight soft tissue stranding around the portion of the colon consistent with colitis.   Electronically Signed   By: Lorriane Shire M.D.   On: 04/03/2015 21:48    Scheduled Meds: . amphetamine-dextroamphetamine  20 mg Oral Q breakfast  . amphetamine-dextroamphetamine  5 mg Oral Daily  . ampicillin-sulbactam (UNASYN) IV  1.5 g Intravenous Q6H  . budesonide-formoterol  2 puff Inhalation BID  . cycloSPORINE  1 drop Both Eyes BID  . enoxaparin (LOVENOX) injection  40 mg Subcutaneous Daily  . [START ON 04/07/2015] estradiol  0.1 mg Transdermal Once per day on Mon Thu  . fentaNYL  75 mcg Transdermal Q48H  . fesoterodine  8 mg Oral Daily  . gabapentin  300 mg Oral TID  . Linaclotide  290 mcg Oral Daily  . loteprednol  1 drop Both Eyes Daily  . lurasidone  40 mg Oral QHS  . metoCLOPramide (REGLAN) injection  10 mg Intravenous 4 times per  day  . metronidazole  500 mg Intravenous Q8H  . pantoprazole (PROTONIX) IV  40 mg Intravenous Q24H  . pramipexole  1 mg Oral QHS  . saccharomyces boulardii  250 mg Oral BID  . tiZANidine  4 mg Oral BID  . topiramate  100 mg Oral Daily   And  . topiramate  200 mg Oral QHS   Continuous Infusions: . dextrose 5 % and 0.45% NaCl 1,000 mL infusion 125 mL/hr at 04/05/15 0805  . sodium chloride 0.9 % 1,000 mL infusion 125 mL/hr at 04/05/15 0026    Principal Problem:   Colitis, acute Active Problems:   Chronic lower back pain   Nausea vomiting and diarrhea   Abdominal pain   Extrinsic asthma   Leukocytosis   Dental infection   Nausea with vomiting    Time spent: 40 mins    [facility] MD [facility] Pager [phone]. If 7PM-7AM, please contact night-coverage at [email or web address], password [facility] 04/05/2015, 1:47 PM  LOS: 3 days

## 2015-04-05 NOTE — Progress Notes (Signed)
[facility] Progress Note  Subjective: The patient feels queasy and slightly nauseated but no more vomiting no more stools, tolerating clear liquid diet  Objective: Vital signs in last 24 hours: Temp:  [98.2 F (36.8 C)-98.5 F (36.9 C)] 98.2 F (36.8 C) (07/30 0525) Pulse Rate:  [64-78] 70 (07/30 0525) Resp:  [13-20] 13 (07/30 0525) BP: (84-102)/(32-63) 97/55 mmHg (07/30 0525) SpO2:  [97 %-98 %] 98 % (07/30 0525) Weight change:    PE: Mild generalized tenderness throughout the abdomen nonfocal  Lab Results: Results for orders placed or performed during the hospital encounter of 04/02/15 (from the past 24 hour(s))  CBC     Status: Abnormal   Collection Time: 04/05/15  5:00 AM  Result Value Ref Range   WBC 5.8 4.0 - 10.5 K/uL   RBC 3.83 (L) 3.87 - 5.11 MIL/uL   Hemoglobin 10.5 (L) 12.0 - 15.0 g/dL   HCT 30.5 (L) 36.0 - 46.0 %   MCV 79.6 78.0 - 100.0 fL   MCH 27.4 26.0 - 34.0 pg   MCHC 34.4 30.0 - 36.0 g/dL   RDW 13.5 11.5 - 15.5 %   Platelets 203 150 - 400 K/uL  Basic metabolic panel     Status: Abnormal   Collection Time: 04/05/15  5:00 AM  Result Value Ref Range   Sodium 141 135 - 145 mmol/L   Potassium 3.7 3.5 - 5.1 mmol/L   Chloride 115 (H) 101 - 111 mmol/L   CO2 23 22 - 32 mmol/L   Glucose, Bld 86 65 - 99 mg/dL   BUN 8 6 - 20 mg/dL   Creatinine, Ser 0.82 0.44 - 1.00 mg/dL   Calcium 8.3 (L) 8.9 - 10.3 mg/dL   GFR calc non Af Amer >60 >60 mL/min   GFR calc Af Amer >60 >60 mL/min   Anion gap 3 (L) 5 - 15    Studies/Results: Ct Abdomen Pelvis W Contrast  04/03/2015   CLINICAL DATA:  Abdominal pain with nausea, vomiting, and diarrhea.  EXAM: CT ABDOMEN AND PELVIS WITH CONTRAST  TECHNIQUE: Multidetector CT imaging of the abdomen and pelvis was performed using the standard protocol following bolus administration of intravenous contrast.  CONTRAST:  84 cc OMNIPAQUE IOHEXOL 300 MG/ML  SOLN  COMPARISON:  Radiographs dated 04/02/2015 and CT scan dated  10/19/2013  FINDINGS: Lower chest:  Normal.  Hepatobiliary: Cholecystectomy.  Otherwise normal.  Pancreas: Normal.  Spleen: Normal.  Adrenals/Urinary Tract: Normal.  Stomach/Bowel: There is subtle edema and slight soft tissue stranding around the descending colon consistent with colitis. The ascending and transverse portions of the colon appear normal. Small bowel is normal. Appendix is normal.  Stomach appears normal.  Vascular/Lymphatic: Normal.  Reproductive: Uterus and ovaries appear to have been removed.  Other: Tiny amount of free fluid in the pelvis.  No free air.  Musculoskeletal: No acute abnormality.  Prior lower lumbar surgery.  IMPRESSION: Edema of the mucosa of the descending colon with slight soft tissue stranding around the portion of the colon consistent with colitis.   Electronically Signed   By: Lorriane Shire M.D.   On: 04/03/2015 21:48      Assessment: Gastroparesis with possible superimposed gastroenteritis, questionable left-sided colitis and reported chronic blood in her stool.  Plan: Continue a clear liquid diet today Continue to monitor stools Continue current meds and supportive care. Will follow-up tomorrow.    Acen Craun C 04/05/2015, 8:48 AM  Pager [phone] If no answer or after 5 PM call [phone]

## 2015-04-06 DIAGNOSIS — K529 Noninfective gastroenteritis and colitis, unspecified: Secondary | ICD-10-CM | POA: Diagnosis present

## 2015-04-06 LAB — CBC
HEMATOCRIT: 31.1 % — AB (ref 36.0–46.0)
Hemoglobin: 10.9 g/dL — ABNORMAL LOW (ref 12.0–15.0)
MCH: 27.6 pg (ref 26.0–34.0)
MCHC: 35 g/dL (ref 30.0–36.0)
MCV: 78.7 fL (ref 78.0–100.0)
Platelets: 211 10*3/uL (ref 150–400)
RBC: 3.95 MIL/uL (ref 3.87–5.11)
RDW: 13.2 % (ref 11.5–15.5)
WBC: 5.3 10*3/uL (ref 4.0–10.5)

## 2015-04-06 LAB — BASIC METABOLIC PANEL
Anion gap: 6 (ref 5–15)
CHLORIDE: 110 mmol/L (ref 101–111)
CO2: 23 mmol/L (ref 22–32)
Calcium: 8.1 mg/dL — ABNORMAL LOW (ref 8.9–10.3)
Creatinine, Ser: 0.87 mg/dL (ref 0.44–1.00)
GFR calc Af Amer: >60 mL/min (ref >60)
GLUCOSE: 84 mg/dL (ref 65–99)
Potassium: 3.3 mmol/L — ABNORMAL LOW (ref 3.5–5.1)
Sodium: 139 mmol/L (ref 135–145)

## 2015-04-06 MED ORDER — POTASSIUM CHLORIDE CRYS ER 20 MEQ PO TBCR
40.0000 meq | EXTENDED_RELEASE_TABLET | Freq: Once | ORAL | Status: AC
  Administered 2015-04-06: 40 meq via ORAL
  Filled 2015-04-06: qty 2

## 2015-04-06 NOTE — Progress Notes (Signed)
[facility] Progress Note  Subjective: Patient states that she tolerated clear liquid diet until she ate some pudding yesterday and this caused a lot of stomach and diarrhea which she states was nonbloody. She did not attempt to eat supper.  Objective: Vital signs in last 24 hours: Temp:  [97.7 F (36.5 C)-98.5 F (36.9 C)] 98.2 F (36.8 C) (07/31 0604) Pulse Rate:  [77-95] 87 (07/31 0604) Resp:  [18-19] 18 (07/31 0604) BP: (96-116)/(49-64) 96/49 mmHg (07/31 0604) SpO2:  [100 %] 100 % (07/31 0604) Weight change:    PE: Unchanged  Lab Results: Results for orders placed or performed during the hospital encounter of 04/02/15 (from the past 24 hour(s))  Clostridium Difficile by PCR (not at [facility])     Status: None   Collection Time: 04/05/15  2:47 PM  Result Value Ref Range   C difficile by pcr NEGATIVE NEGATIVE  CBC     Status: Abnormal   Collection Time: 04/06/15  7:17 AM  Result Value Ref Range   WBC 5.3 4.0 - 10.5 K/uL   RBC 3.95 3.87 - 5.11 MIL/uL   Hemoglobin 10.9 (L) 12.0 - 15.0 g/dL   HCT 31.1 (L) 36.0 - 46.0 %   MCV 78.7 78.0 - 100.0 fL   MCH 27.6 26.0 - 34.0 pg   MCHC 35.0 30.0 - 36.0 g/dL   RDW 13.2 11.5 - 15.5 %   Platelets 211 150 - 400 K/uL  Basic metabolic panel     Status: Abnormal   Collection Time: 04/06/15  7:17 AM  Result Value Ref Range   Sodium 139 135 - 145 mmol/L   Potassium 3.3 (L) 3.5 - 5.1 mmol/L   Chloride 110 101 - 111 mmol/L   CO2 23 22 - 32 mmol/L   Glucose, Bld 84 65 - 99 mg/dL   BUN <5 (L) 6 - 20 mg/dL   Creatinine, Ser 0.87 0.44 - 1.00 mg/dL   Calcium 8.1 (L) 8.9 - 10.3 mg/dL   GFR calc non Af Amer >60 >60 mL/min   GFR calc Af Amer >60 >60 mL/min   Anion gap 6 5 - 15    Studies/Results: No results found.    Assessment: 1. 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 Questionable left-sided colitis on CT scan with patient reporting chronic blood in stools, so far no bloody stools documented. Nursing order to document stools is in place  Plan: Given yesterday's difficulties will hold at clear liquid diet today, continue Reglan and Protonix. If does not improve, consider IV erythromycin Continue to monitor stools for any visible blood and possibly consider colonoscopy if documented. Note colonoscopy in 2015 was normal.     Laruth Hanger C 04/06/2015, 8:50 AM  Pager [phone] If no answer or after 5 PM call [phone]

## 2015-04-06 NOTE — Progress Notes (Signed)
Utilization Review completed. Awad Gladd RN BSN CM 

## 2015-04-06 NOTE — Progress Notes (Addendum)
[facility] PROGRESS NOTE  [name] GEX:[id number] DOB: November 08, 1970 DOA: 04/02/2015 PCP: Donnie Coffin, MD  Assessment/Plan: #1 diabetic Gastroparesis with superimposed probable viral gastroenteritis versus colitis Patient with acute colitis noted on CT of the abdomen and pelvis. Patient had a leukocytosis. Patient with no bowel movement today patient denies any emesis. Patient with diarrhea yesterday after trying for liquids. No significant change in abdominal pain. Patient endorses nausea. Patient tolerating clear liquids. Continue IV Reglan as per GI as to most of the issues is secondary to gastroparesis with underlying enteritis. Continue empiric IV Unasyn. Discontinue IV Flagyl. Continue Protonix. GI if no improvement may need to consider IV erythromycin and possibly consider colonoscopy. GI following and appreciate input and recommendations.   #2 Leukocytosis Likely secondary to acute colitis. Patient with nausea vomiting abdominal pain and diarrhea. Stool studies negative for C. difficile. Acute abdominal series is negative. CT of the abdomen and pelvis consistent with descending colitis. Continue IV Unasyn. Discontinue IV Flagyl. Supportive care.  #3 chronic low back pain Continue home pain regimen.  #4 history of asthma Stable. Continue Symbicort.  #5 hypertension Blood pressure is borderline. Discontinued verapamil. IV fluids  #6 dental infection Patient was on amoxicillin prior to admission. D/C amoxicillin. Patient on IV Unasyn.   #7 hypokalemia Secondary to GI losses. Replete.  #8 prophylaxis PPI for GI prophylaxis. Lovenox for DVT prophylaxis.  Code Status: Full Family Communication: Updated patient. No family at bedside. Disposition Plan: Home when no further nausea vomiting or diarrhea and tolerating oral intake.   Consultants:  Gastroenterology: Dr. Amedeo Plenty 04/04/2015  Procedures:  Acute abdominal series 04/02/2015  CT abdomen and pelvis  04/03/2015  Antibiotics:  IV ciprofloxacin 04/03/2015>>>>>> 04/04/2015  IV Flagyl 04/03/2015>>>> 04/06/2015  IV Unasyn 04/04/2015  HPI/Subjective: Patient states no emesis. Patient still with nausea. Patient states after she tried some full liquids yesterday had some nausea and diarrhea no further diarrhea today.   Objective: Filed Vitals:   04/06/15 0604  BP: 96/49  Pulse: 87  Temp: 98.2 F (36.8 C)  Resp: 18    Intake/Output Summary (Last 24 hours) at 04/06/15 1146 Last data filed at 04/06/15 1107  Gross per 24 hour  Intake 4182.5 ml  Output   1350 ml  Net 2832.5 ml   Filed Weights   04/02/15 1349  Weight: 74.844 kg (165 lb)    Exam:   General:  NAD  Cardiovascular: RRR  Respiratory: CTAB  Abdomen: Soft/diffuse tenderness to palpation/nondistended/positive bowel sounds  Musculoskeletal: No c/c/e  Data Reviewed: Basic Metabolic Panel:  Recent Labs Lab 04/02/15 1427 04/03/15 0715 04/04/15 0644 04/05/15 0500 04/06/15 0717  NA 139 139 139 141 139  K 3.8 3.9 4.2 3.7 3.3*  CL 104 108 112* 115* 110  CO2 24 20* 21* 23 23  GLUCOSE 114* 117* 89 86 84  BUN 6 <5* 10 8 <5*  CREATININE 0.76 0.70 0.84 0.82 0.87  CALCIUM 8.8* 9.4 8.6* 8.3* 8.1*  MG  --  1.7 2.2  --   --    Liver Function Tests:  Recent Labs Lab 04/02/15 1427 04/03/15 0715 04/04/15 0644  AST 22 21 14*  ALT 13* 15 11*  ALKPHOS 52 62 45  BILITOT 0.6 0.8 1.2  PROT 7.6 7.4 6.1*  ALBUMIN 4.0 4.1 3.3*    Recent Labs Lab 04/02/15 1427  LIPASE 20*   No results for input(s): AMMONIA in the last 168 hours. CBC:  Recent Labs Lab 04/02/15 1427 04/03/15 0715 04/04/15 0644 04/05/15 0500  04/06/15 0717  WBC 10.3 15.2* 8.8 5.8 5.3  NEUTROABS  --  13.3* 5.5  --   --   HGB 14.3 14.2 11.7* 10.5* 10.9*  HCT 40.9 40.8 33.4* 30.5* 31.1*  MCV 81.3 80.6 80.3 79.6 78.7  PLT 252 253 231 203 211   Cardiac Enzymes: No results for input(s): CKTOTAL, CKMB, CKMBINDEX, TROPONINI in the last  168 hours. BNP (last 3 results) No results for input(s): BNP in the last 8760 hours.  ProBNP (last 3 results) No results for input(s): PROBNP in the last 8760 hours.  CBG: No results for input(s): GLUCAP in the last 168 hours.  Recent Results (from the past 240 hour(s))  Clostridium Difficile by PCR (not at [facility])     Status: None   Collection Time: 04/05/15  2:47 PM  Result Value Ref Range Status   C difficile by pcr NEGATIVE NEGATIVE Final     Studies: No results found.  Scheduled Meds: . amphetamine-dextroamphetamine  20 mg Oral Q breakfast  . amphetamine-dextroamphetamine  5 mg Oral Daily  . ampicillin-sulbactam (UNASYN) IV  1.5 g Intravenous Q6H  . budesonide-formoterol  2 puff Inhalation BID  . cycloSPORINE  1 drop Both Eyes BID  . enoxaparin (LOVENOX) injection  40 mg Subcutaneous Daily  . [START ON 04/07/2015] estradiol  0.1 mg Transdermal Once per day on Mon Thu  . fentaNYL  75 mcg Transdermal Q48H  . fesoterodine  8 mg Oral Daily  . gabapentin  300 mg Oral TID  . Linaclotide  290 mcg Oral Daily  . loteprednol  1 drop Both Eyes Daily  . lurasidone  40 mg Oral QHS  . metoCLOPramide (REGLAN) injection  10 mg Intravenous 4 times per day  . pantoprazole (PROTONIX) IV  40 mg Intravenous Q24H  . pramipexole  1 mg Oral QHS  . saccharomyces boulardii  250 mg Oral BID  . tiZANidine  4 mg Oral BID  . topiramate  100 mg Oral Daily   And  . topiramate  200 mg Oral QHS   Continuous Infusions: . dextrose 5 % and 0.45% NaCl 1,000 mL infusion 125 mL/hr at 04/06/15 0635  . sodium chloride 0.9 % 1,000 mL infusion 125 mL/hr at 04/05/15 0026    Principal Problem:   Gastroparesis due to DM Active Problems:   Enteritis   Chronic lower back pain   Nausea vomiting and diarrhea   Abdominal pain   Extrinsic asthma   Leukocytosis   Colitis, acute   Dental infection   Nausea with vomiting    Time spent: 28 mins    [facility] MD [facility] Pager [phone]. If  7PM-7AM, please contact night-coverage at [email or web address], password [facility] 04/06/2015, 11:46 AM  LOS: 4 days

## 2015-04-07 ENCOUNTER — Encounter (HOSPITAL_COMMUNITY)
Admission: EM | Disposition: A | Discharge status: Home / Self Care | Payer: Self-pay | Source: Home / Self Care | Attending: Internal Medicine

## 2015-04-07 ENCOUNTER — Encounter (HOSPITAL_COMMUNITY): Payer: Self-pay

## 2015-04-07 HISTORY — PX: FLEXIBLE SIGMOIDOSCOPY: SHX5431

## 2015-04-07 LAB — BASIC METABOLIC PANEL
ANION GAP: 7 (ref 5–15)
CALCIUM: 8.2 mg/dL — AB (ref 8.9–10.3)
CO2: 21 mmol/L — ABNORMAL LOW (ref 22–32)
Chloride: 111 mmol/L (ref 101–111)
Creatinine, Ser: 0.78 mg/dL (ref 0.44–1.00)
GFR calc Af Amer: >60 mL/min (ref >60)
GLUCOSE: 91 mg/dL (ref 65–99)
Potassium: 3.6 mmol/L (ref 3.5–5.1)
SODIUM: 139 mmol/L (ref 135–145)

## 2015-04-07 LAB — CBC
HEMATOCRIT: 30.3 % — AB (ref 36.0–46.0)
Hemoglobin: 10.5 g/dL — ABNORMAL LOW (ref 12.0–15.0)
MCH: 27.3 pg (ref 26.0–34.0)
MCHC: 34.7 g/dL (ref 30.0–36.0)
MCV: 78.9 fL (ref 78.0–100.0)
PLATELETS: 204 10*3/uL (ref 150–400)
RBC: 3.84 MIL/uL — AB (ref 3.87–5.11)
RDW: 13.3 % (ref 11.5–15.5)
WBC: 4.5 10*3/uL (ref 4.0–10.5)

## 2015-04-07 SURGERY — SIGMOIDOSCOPY, FLEXIBLE
Anesthesia: Moderate Sedation | Laterality: Left

## 2015-04-07 MED ORDER — MORPHINE SULFATE 2 MG/ML IJ SOLN
1.0000 mg | INTRAMUSCULAR | Status: DC | PRN
  Administered 2015-04-07 – 2015-04-10 (×12): 1 mg via INTRAVENOUS
  Filled 2015-04-07 (×13): qty 1

## 2015-04-07 MED ORDER — SODIUM CHLORIDE 0.9 % IV SOLN: INTRAVENOUS | Status: DC

## 2015-04-07 NOTE — Progress Notes (Signed)
Patient's sigmoidoscopy was grossly normal.  I would consider decreasing, or stopping, her Linzess until the diarrhea has resolved.  Please see dictated note for additional details.  Cleotis Nipper, M.D. Pager [phone] If no answer or after 5 PM call [phone]

## 2015-04-07 NOTE — Interval H&P Note (Signed)
History and Physical Interval Note:  04/07/2015 4:11 PM  [name]  has presented today for surgery, with the diagnosis of refractory diarrhea, descending colon thickening on CT  The various methods of treatment have been discussed with the patient. After consideration of risks, benefits and other options for treatment, the patient has consented to  Procedure(s): FLEXIBLE SIGMOIDOSCOPY (Left) as a surgical intervention .  The patient's history has been reviewed, patient examined, no change in status, stable for surgery.  I have reviewed the patient's chart and labs.  Questions were answered to the patient's satisfaction.     Cleotis Nipper

## 2015-04-07 NOTE — Evaluation (Signed)
Physical Therapy Evaluation Patient Details Name: [name] MRN: [id number] DOB: 29-Nov-1970 Today's Date: 04/07/2015   History of Present Illness  44 y.o female admitted withi nausea, diarrhea  Clinical Impression  Patient with mild instability and activity intolerance with PT evaluation. Patient did not utilize and assistive devices with ambulation but demonstrated some mild instability but no loss of balance. Activity limited by patient's reports of feeling a little lightheaded. Will continue to follow to progress mobilization for anticipated D/C home with family assistance.     Follow Up Recommendations No PT follow up    Equipment Recommendations  None recommended by PT    Recommendations for Other Services       Precautions / Restrictions Precautions Precautions: None Restrictions Weight Bearing Restrictions: No      Mobility  Bed Mobility Overal bed mobility: Modified Independent             General bed mobility comments: supine to sit with bed rail  Transfers Overall transfer level: Modified independent (using rail for assistance) Equipment used: None                Ambulation/Gait Ambulation/Gait assistance: Supervision Ambulation Distance (Feet): 250 Feet Assistive device: None Gait Pattern/deviations: WFL(Within Functional Limits) Gait velocity: slow cadence   General Gait Details: mild instability but no loss of balance  Stairs            Wheelchair Mobility    Modified Rankin (Stroke Patients Only)       Balance Overall balance assessment: Needs assistance Sitting-balance support: No upper extremity supported Sitting balance-Leahy Scale: Normal     Standing balance support: No upper extremity supported Standing balance-Leahy Scale: Fair                               Pertinent Vitals/Pain Pain Assessment: 0-10 (patient reporting it's doing okay right now, unable to rate) Pain Score: 10-Worst pain  ever Pain Location: back and abdomen Pain Descriptors / Indicators: Squeezing;Pounding Pain Intervention(s): Monitored during session    [place] expects to be discharged to:: Private residence Living Arrangements: Children;Spouse/significant other Available Help at Discharge: Family Type of Home: House Home Access: Level entry     Home Layout: Two level;Bed/bath upstairs Home Equipment: Environmental consultant - 2 wheels;Crutches Additional Comments: Uses BSC and shower chair at home; obtained them when she had back surgery    Prior Function Level of Independence: Independent         Comments: denies using any assistive devices with mobility     Hand Dominance   Dominant Hand: Right    Extremity/Trunk Assessment   Upper Extremity Assessment: Overall WFL for tasks assessed           Lower Extremity Assessment: Generalized weakness         Communication   Communication: No difficulties  Cognition Arousal/Alertness: Awake/alert Behavior During Therapy: WFL for tasks assessed/performed Overall Cognitive Status: Within Functional Limits for tasks assessed                      General Comments      Exercises        Assessment/Plan    PT Assessment Patient needs continued PT services  PT Diagnosis Generalized weakness   PT Problem List Decreased activity tolerance;Decreased balance  PT Treatment Interventions Gait training;Stair training;Therapeutic activities;Therapeutic exercise;Balance training;Patient/family education   PT Goals (Current goals can be found in the  Care Plan section) Acute Rehab PT Goals Patient Stated Goal: Just feel better PT Goal Formulation: With patient Time For Goal Achievement: 04/21/15 Potential to Achieve Goals: Good    Frequency Min 3X/week   Barriers to discharge        Co-evaluation               End of Session Equipment Utilized During Treatment: Gait belt Activity Tolerance: Other (comment)  (reports feeling a little lightheaded. ) Patient left: Other (comment) (Edge of bed with OT)           Time: 1010-1027 PT Time Calculation (min) (ACUTE ONLY): 17 min   Charges:   PT Evaluation $Initial PT Evaluation Tier I: 1 Procedure     PT G Codes:        Cassell Clement, PT, CSCS Pager [phone] Office [phone]   04/07/2015, 10:54 AM

## 2015-04-07 NOTE — Progress Notes (Signed)
Subjective: Continues to endorse voluminous coffee-ground diarrhea, 10+ per day, with incontinence. Continued generalized crampy abdominal pain. No vomiting, but continued nausea.  Objective: Vital signs in last 24 hours: Temp:  [98.1 F (36.7 C)-98.6 F (37 C)] 98.1 F (36.7 C) (08/01 0506) Pulse Rate:  [78-139] 78 (08/01 0506) Resp:  [18-20] 18 (08/01 0506) BP: (101-116)/(55-77) 101/55 mmHg (08/01 0506) SpO2:  [97 %-100 %] 99 % (08/01 0910) Weight change:  Last BM Date: 04/06/15  PE: GEN:  NAD ABD:  Soft, mild generalized tenderness without peritonitis, active bowel sounds  Lab Results: CBC    Component Value Date/Time   WBC 4.5 04/07/2015 0644   WBC 6.1 02/07/2014 1547   RBC 3.84* 04/07/2015 0644   RBC 4.77 02/07/2014 1547   HGB 10.5* 04/07/2015 0644   HCT 30.3* 04/07/2015 0644   PLT 204 04/07/2015 0644   MCV 78.9 04/07/2015 0644   MCH 27.3 04/07/2015 0644   MCH 28.1 02/07/2014 1547   MCHC 34.7 04/07/2015 0644   MCHC 34.1 02/07/2014 1547   RDW 13.3 04/07/2015 0644   RDW 14.7 02/07/2014 1547   LYMPHSABS 2.8 04/04/2015 0644   LYMPHSABS 2.1 02/07/2014 1547   MONOABS 0.6 04/04/2015 0644   EOSABS 0.0 04/04/2015 0644   EOSABS 0.1 02/07/2014 1547   BASOSABS 0.0 04/04/2015 0644   BASOSABS 0.0 02/07/2014 1547   CMP     Component Value Date/Time   NA 139 04/07/2015 0644   NA 142 08/07/2014 0922   K 3.6 04/07/2015 0644   CL 111 04/07/2015 0644   CO2 21* 04/07/2015 0644   GLUCOSE 91 04/07/2015 0644   GLUCOSE 90 08/07/2014 0922   BUN <5* 04/07/2015 0644   BUN 15 08/07/2014 0922   CREATININE 0.78 04/07/2015 0644   CALCIUM 8.2* 04/07/2015 0644   PROT 6.1* 04/04/2015 0644   PROT 7.7 08/07/2014 0922   ALBUMIN 3.3* 04/04/2015 0644   AST 14* 04/04/2015 0644   ALT 11* 04/04/2015 0644   ALKPHOS 45 04/04/2015 0644   BILITOT 1.2 04/04/2015 0644   GFRNONAA >60 04/07/2015 0644   GFRAA >60 04/07/2015 0644   Assessment:  1.  Nausea (and prior vomiting).  History  gastroparesis.  Certainly the large amounts of narcotics she is receiving for her back pain and, now abdominal pain, is not helping. 2.  Diarrhea.  C. Diff negative.  Unclear etiology. 3.  Abnormal CT abdomen, thickening and edema of descending colon.  Plan:  1.  Sigmoidoscopy today by Dr. Cristina Gong, tentatively planned 3:00 pm, to evaluate diarrhea and inspect descending colon given possible colitis seen in that area on CT scan. 2.  Check GI pathogen panel stool. 3.  Stop antibiotics (they aren't helping, may be making matters worse, and I can't find a compelling reason to continue them). 4.  Next step in management pending sigmoidoscopy findings.   Landry Dyke 04/07/2015, 10:12 AM   Pager [phone] If no answer or after 5 PM call [phone]

## 2015-04-07 NOTE — Op Note (Signed)
[facility] [place], [address]   FLEXIBLE SIGMOIDOSCOPY PROCEDURE REPORT  PATIENT: [name], [name]  MR#: [id number] BIRTHDATE: 1971/04/04 , 74  yrs. old GENDER: female ENDOSCOPIST: Ronald Lobo, MD REFERRED BY: Dr. Donnie Coffin PROCEDURE DATE:  04/07/2015 PROCEDURE:   Flexible sigmoidoscopy with biopsies ASA CLASS:   III INDICATIONS:  lingering diarrhea, and an illness that started with nausea and vomiting with diarrhea, then transient improvement in the diarrhea, followed by recurrence of the diarrhea with attempts at refeeding  MEDICATIONS: None (unstable)  DESCRIPTION OF PROCEDURE:   Tthe patient was brought from her hospital room to the [facility] endoscopy unit.  After the risks benefits and alternatives of the procedure were thoroughly explained, informed consent was obtained. The procedure was done unprepped.  Digital exam showed generous anal sphincter tone but no evident perianal pathology.      The Pentax pediatric video colonoscope  was introduced through the anus  and advanced to 40 cm, felt to correspond to the distal descending colon.      , The exam was Without limitations.    The quality of the prep was very good despite the absence of any prep being administered.      . Estimated blood loss is zero unless otherwise noted in this procedure report. The instrument was then slowly withdrawn as the mucosa was fully examined.       There was no blood in the colonic lumen. There was a very small amount of semi-formed stool, a small amount of liquid stool, and some pasty mucoid adherent stool.  This was really a normal examination, up to the limit of the exam, 40 cm, which probably corresponded to the distal descending colon. I did not see any colitis, mucosal edema, erythema, or other mucosal abnormalities. No polyps or masses, no diverticular change or vascular ectasia.  The mucosa had a nice vascular pattern. No  granularity, exudate, or pseudomembranes were observed.  Retroflexion in the rectum was normal.  Random mucosal biopsies were obtained from the distal descending colon and the sigmoid colon, to check for microscopic colitis. I avoided biopsy in the rectum for fear of the biopsies mimicking collagenous colitis in that location.  The scope was then withdrawn from the patient and the procedure terminated.  COMPLICATIONS: There were no immediate complications.  ENDOSCOPIC IMPRESSION: 1. Normal flexible sigmoidoscopy to 40 cm 2. Paucity of formed stool, consistent with patient's stated history of recurrence of diarrhea.  Discussion: One would wonder about a viral gastroenteritis in view of the concurrent onset of upper tract symptoms with the diarrhea (that is, nausea and vomiting along with the diarrhea) . Other possibilities would include microscopic colitis or bacterial imbalances in the intestine, or diabetic autonomic diarrhea.  RECOMMENDATIONS: Await pathology results. This patient is medically complex, and is on medications that can cause both constipation (fentanyl) and diarrhea (Linzess); since she is currently bothered by diarrhea, I would consider stopping or reducing the dose of Linzess.  REPEAT EXAM: not necessary  eSigned:  Ronald Lobo, MD 04/07/2015 4:38 PM   CC:  PATIENT NAME:  [name], [name] MR#: [id number]

## 2015-04-07 NOTE — Progress Notes (Signed)
Occupational Therapy Evaluation Patient Details Name: ZYKERIA LAGUARDIA MRN: [id number] DOB: [birth year] Today's Date: 04/07/2015    History of Present Illness 44 y.o female admitted withi nausea, diarrhea   Clinical Impression   Patient reports no difficulty with ADLs at this time and daughter confirms. OT will sign off.    Follow Up Recommendations  No OT follow up;Supervision - Intermittent    Equipment Recommendations  None recommended by OT    Recommendations for Other Services       Precautions / Restrictions Precautions Precautions: None Restrictions Weight Bearing Restrictions: No      Mobility Bed Mobility Overal bed mobility: Modified Independent             General bed mobility comments: sit to supine  Transfers Overall transfer level: Modified independent Equipment used: None                  Balance Overall balance assessment: Needs assistance Sitting-balance support: No upper extremity supported Sitting balance-Leahy Scale: Normal     Standing balance support: No upper extremity supported Standing balance-Leahy Scale: Fair                              ADL Overall ADL's : Needs assistance/impaired Eating/Feeding: Independent;Sitting   Grooming: Wash/dry hands;Wash/dry face;Oral care;Set up;Sitting;Supervision/safety;Standing   Upper Body Bathing: Supervision/ safety;Set up;Standing;Sitting   Lower Body Bathing: Set up;Supervison/ safety;Sit to/from stand   Upper Body Dressing : Set up;Supervision/safety;Sitting;Standing   Lower Body Dressing: Set up;Supervision/safety;Sit to/from stand   Toilet Transfer: Sales executive;Ambulation;BSC   Toileting- Clothing Manipulation and Hygiene: Set up;Supervision/safety;Sit to/from stand       Functional mobility during ADLs: Supervision/safety General ADL Comments: Observed patient ambulating from hall to room with supervision. Patient and daughter present  for OT evaluation. Patient demonstrated ability to reach all body parts. Patient and daughter report she is able to perform toileting, bathing, dressing, and even taking a shower with distant supervision. The only issue patient is having is activity tolerance, which can be addressed by PT. No further OT needs; will sign off.     Vision     Perception     Praxis      Pertinent Vitals/Pain Pain Assessment: 0-10 Pain Score: 10-Worst pain ever Pain Location: back and abdoment Pain Descriptors / Indicators: Pounding;Squeezing Pain Intervention(s): Limited activity within patient's tolerance;Monitored during session;Other (comment) (pt reports receivntly received IV pain medication)     Hand Dominance Right   Extremity/Trunk Assessment Upper Extremity Assessment Upper Extremity Assessment: Overall WFL for tasks assessed   Lower Extremity Assessment Lower Extremity Assessment: Defer to PT evaluation       Communication Communication Communication: No difficulties   Cognition Arousal/Alertness: Awake/alert Behavior During Therapy: WFL for tasks assessed/performed Overall Cognitive Status: Within Functional Limits for tasks assessed                     General Comments       Exercises       Shoulder Instructions      Home Living Family/patient expects to be discharged to:: Private residence Living Arrangements: Children;Spouse/significant other Available Help at Discharge: Family Type of Home: House Home Access: Level entry     Home Layout: Two level;Bed/bath upstairs Alternate Level Stairs-Number of Steps: 15 Alternate Level Stairs-Rails: Right Bathroom Shower/Tub: Occupational psychologist: Standard     Home Equipment: Shower seat;Bedside commode;Walker - 2 wheels;Crutches  Additional Comments: Uses BSC and shower chair at home; obtained them when she had back surgery      Prior Functioning/Environment Level of Independence: Independent with  assistive device(s)        Comments: BSC and shower chair    OT Diagnosis: Generalized weakness   OT Problem List: Decreased activity tolerance   OT Treatment/Interventions:      OT Goals(Current goals can be found in the care plan section) Acute Rehab OT Goals Patient Stated Goal: none stated OT Goal Formulation: All assessment and education complete, DC therapy  OT Frequency:     Barriers to D/C:            Co-evaluation              End of Session    Activity Tolerance: Patient tolerated treatment well Patient left: in bed;with call bell/phone within reach;with family/visitor present   Time: 1884-1660 OT Time Calculation (min): 15 min Charges:  OT General Charges $OT Visit: 1 Procedure OT Evaluation $Initial OT Evaluation Tier I: 1 Procedure G-Codes:    Hettie Roselli A 05-07-2015, 11:24 AM

## 2015-04-07 NOTE — Progress Notes (Signed)
[facility] PROGRESS NOTE  CASY TAVANO LGX:[id number] DOB: [birth year] DOA: 04/02/2015 PCP: Donnie Coffin, MD  Assessment/Plan: #1 diabetic Gastroparesis with superimposed probable viral gastroenteritis versus colitis Patient with acute colitis noted on CT of the abdomen and pelvis. Patient had a leukocytosis which has since resolved. Patient with numerous loose stools yesterday per patient. Patient endorses nausea. Patient still complaining of abdominal pain. Patient denies any emesis. No significant change in abdominal pain. Patient tolerating clear liquids. Continue IV Reglan as per GI as to most of the issues is secondary to gastroparesis with underlying enteritis. IV Unasyn has been discontinued by GI. IV Flagyl was discontinued yesterday. Continue Protonix. Patient has been seen by GI and patient has been scheduled for a sigmoidoscopy this afternoon. GI pathogen panel has been ordered.GI FF.   #2 Leukocytosis Likely secondary to acute colitis. Patient with nausea vomiting abdominal pain and diarrhea. Stool studies negative for C. difficile. Acute abdominal series is negative. CT of the abdomen and pelvis consistent with descending colitis. WBC within normal limits. IV Flagyl has been discontinued. IV Unasyn has been discontinued.   #3 chronic low back pain Continue home pain regimen.  #4 history of asthma Stable. Continue Symbicort.  #5 hypertension Blood pressure is borderline. Discontinued verapamil. IV fluids  #6 dental infection Patient was on amoxicillin prior to admission. D/C amoxicillin. D/C IV Unasyn. Patient has had about 5 days of antibiotic treatment. Outpatient follow-up.  #7 hypokalemia Secondary to GI losses. Replete.  #8 prophylaxis PPI for GI prophylaxis. Lovenox for DVT prophylaxis.  Code Status: Full Family Communication: Updated patient. No family at bedside. Disposition Plan: Home when no further nausea vomiting or diarrhea and tolerating oral  intake.   Consultants:  Gastroenterology: Dr. Amedeo Plenty 04/04/2015  Procedures:  Acute abdominal series 04/02/2015  CT abdomen and pelvis 04/03/2015  Antibiotics:  IV ciprofloxacin 04/03/2015>>>>>> 04/04/2015  IV Flagyl 04/03/2015>>>> 04/06/2015  IV Unasyn 04/04/2015>>>>>04/07/15  HPI/Subjective: Patient states no emesis. Patient still with nausea, and diarrhea. Patient tolerating clear liquids.   Objective: Filed Vitals:   04/07/15 1355  BP: 110/63  Pulse: 81  Temp: 98.4 F (36.9 C)  Resp:     Intake/Output Summary (Last 24 hours) at 04/07/15 1431 Last data filed at 04/07/15 1407  Gross per 24 hour  Intake 3809.59 ml  Output    250 ml  Net 3559.59 ml   Filed Weights   04/02/15 1349  Weight: 74.844 kg (165 lb)    Exam:   General:  NAD  Cardiovascular: RRR  Respiratory: CTAB  Abdomen: Soft/diffuse tenderness to palpation/nondistended/positive bowel sounds  Musculoskeletal: No c/c/e  Data Reviewed: Basic Metabolic Panel:  Recent Labs Lab 04/03/15 0715 04/04/15 0644 04/05/15 0500 04/06/15 0717 04/07/15 0644  NA 139 139 141 139 139  K 3.9 4.2 3.7 3.3* 3.6  CL 108 112* 115* 110 111  CO2 20* 21* 23 23 21*  GLUCOSE 117* 89 86 84 91  BUN <5* 10 8 <5* <5*  CREATININE 0.70 0.84 0.82 0.87 0.78  CALCIUM 9.4 8.6* 8.3* 8.1* 8.2*  MG 1.7 2.2  --   --   --    Liver Function Tests:  Recent Labs Lab 04/02/15 1427 04/03/15 0715 04/04/15 0644  AST 22 21 14*  ALT 13* 15 11*  ALKPHOS 52 62 45  BILITOT 0.6 0.8 1.2  PROT 7.6 7.4 6.1*  ALBUMIN 4.0 4.1 3.3*    Recent Labs Lab 04/02/15 1427  LIPASE 20*   No results for input(s): AMMONIA in the  last 168 hours. CBC:  Recent Labs Lab 04/03/15 0715 04/04/15 0644 04/05/15 0500 04/06/15 0717 04/07/15 0644  WBC 15.2* 8.8 5.8 5.3 4.5  NEUTROABS 13.3* 5.5  --   --   --   HGB 14.2 11.7* 10.5* 10.9* 10.5*  HCT 40.8 33.4* 30.5* 31.1* 30.3*  MCV 80.6 80.3 79.6 78.7 78.9  PLT 253 231 203 211 204    Cardiac Enzymes: No results for input(s): CKTOTAL, CKMB, CKMBINDEX, TROPONINI in the last 168 hours. BNP (last 3 results) No results for input(s): BNP in the last 8760 hours.  ProBNP (last 3 results) No results for input(s): PROBNP in the last 8760 hours.  CBG: No results for input(s): GLUCAP in the last 168 hours.  Recent Results (from the past 240 hour(s))  Clostridium Difficile by PCR (not at [facility])     Status: None   Collection Time: 04/05/15  2:47 PM  Result Value Ref Range Status   C difficile by pcr NEGATIVE NEGATIVE Final     Studies: No results found.  Scheduled Meds: . amphetamine-dextroamphetamine  20 mg Oral Q breakfast  . amphetamine-dextroamphetamine  5 mg Oral Daily  . budesonide-formoterol  2 puff Inhalation BID  . cycloSPORINE  1 drop Both Eyes BID  . enoxaparin (LOVENOX) injection  40 mg Subcutaneous Daily  . estradiol  0.1 mg Transdermal Once per day on Mon Thu  . fentaNYL  75 mcg Transdermal Q48H  . fesoterodine  8 mg Oral Daily  . gabapentin  300 mg Oral TID  . Linaclotide  290 mcg Oral Daily  . loteprednol  1 drop Both Eyes Daily  . lurasidone  40 mg Oral QHS  . metoCLOPramide (REGLAN) injection  10 mg Intravenous 4 times per day  . pantoprazole (PROTONIX) IV  40 mg Intravenous Q24H  . pramipexole  1 mg Oral QHS  . saccharomyces boulardii  250 mg Oral BID  . tiZANidine  4 mg Oral BID  . topiramate  100 mg Oral Daily   And  . topiramate  200 mg Oral QHS   Continuous Infusions: . dextrose 5 % and 0.45% NaCl 1,000 mL infusion 125 mL/hr at 04/07/15 1359  . sodium chloride 0.9 % 1,000 mL infusion 125 mL/hr at 04/05/15 0026    Principal Problem:   Gastroparesis due to DM Active Problems:   Enteritis   Chronic lower back pain   Nausea vomiting and diarrhea   Abdominal pain   Extrinsic asthma   Leukocytosis   Colitis, acute   Dental infection   Nausea with vomiting    Time spent: 34 mins    [facility] MD [facility] Pager [phone]. If 7PM-7AM, please contact night-coverage at [email or web address], password [facility] 04/07/2015, 2:31 PM  LOS: 5 days

## 2015-04-08 ENCOUNTER — Encounter (HOSPITAL_COMMUNITY): Payer: Self-pay | Admitting: Gastroenterology

## 2015-04-08 LAB — BASIC METABOLIC PANEL
ANION GAP: 7 (ref 5–15)
CHLORIDE: 108 mmol/L (ref 101–111)
CO2: 23 mmol/L (ref 22–32)
Calcium: 8.1 mg/dL — ABNORMAL LOW (ref 8.9–10.3)
Creatinine, Ser: 0.79 mg/dL (ref 0.44–1.00)
GFR calc Af Amer: >60 mL/min (ref >60)
GFR calc non Af Amer: >60 mL/min (ref >60)
Glucose, Bld: 83 mg/dL (ref 65–99)
Potassium: 3.4 mmol/L — ABNORMAL LOW (ref 3.5–5.1)
SODIUM: 138 mmol/L (ref 135–145)

## 2015-04-08 LAB — CBC
HCT: 30.5 % — ABNORMAL LOW (ref 36.0–46.0)
Hemoglobin: 10.7 g/dL — ABNORMAL LOW (ref 12.0–15.0)
MCH: 28.2 pg (ref 26.0–34.0)
MCHC: 35.1 g/dL (ref 30.0–36.0)
MCV: 80.5 fL (ref 78.0–100.0)
Platelets: 212 10*3/uL (ref 150–400)
RBC: 3.79 MIL/uL — AB (ref 3.87–5.11)
RDW: 13.5 % (ref 11.5–15.5)
WBC: 5 10*3/uL (ref 4.0–10.5)

## 2015-04-08 LAB — MAGNESIUM: Magnesium: 1.7 mg/dL (ref 1.7–2.4)

## 2015-04-08 MED ORDER — BOOST / RESOURCE BREEZE PO LIQD
1.0000 | Freq: Three times a day (TID) | ORAL | Status: DC
  Administered 2015-04-08 – 2015-04-10 (×6): 1 via ORAL

## 2015-04-08 NOTE — Progress Notes (Signed)
Physical Therapy Treatment/Discharge Patient Details Name: [name] MRN: [id number] DOB: 11/12/1970 Today's Date: 04/08/2015    History of Present Illness 44 y.o female admitted withi nausea, diarrheaGI workup in progress.  Pt with significant PMHx of gatroparesis, chronic low back pain (on narcotics at baseline) with history of low back surgeries, psychosis, schizophrenia, OCD, bipolar, depression/anxiety, migraine, narcolepsy, ankle fusion, and shoulder surgery.     PT Comments    Pt is able to walk and mobilize without signs of imbalance today. She is independent in room and would benefit from staff and family to assist with daily gait to help prevent deconditioning and help keep her back from getting stiff.  Since family is available to walk with her and she is at mod I to independent level, she has no further acute care needs at this time PT to sign off.   Follow Up Recommendations  No PT follow up     Equipment Recommendations  None recommended by PT    Recommendations for Other Services   NA     Precautions / Restrictions Precautions Precautions: None    Mobility  Bed Mobility Overal bed mobility: Modified Independent             General bed mobility comments: used bed rail and HOB elelvated  Transfers Overall transfer level: Modified independent Equipment used: None             General transfer comment: Uses hands for transitions  Ambulation/Gait Ambulation/Gait assistance: Modified independent (Device/Increase time) Ambulation Distance (Feet): 450 Feet Assistive device: None Gait Pattern/deviations: WFL(Within Functional Limits) Gait velocity: decreased Gait velocity interpretation: Below normal speed for age/gender General Gait Details: Pt with slow, but steady gait pattern.  No assistive device used.  Pt reports generalized weakness likely due to N/V/D and only on liquid diet.           Balance Overall balance assessment: No apparent  balance deficits (not formally assessed)                                  Cognition Arousal/Alertness: Awake/alert Behavior During Therapy: WFL for tasks assessed/performed Overall Cognitive Status: Within Functional Limits for tasks assessed                         General Comments General comments (skin integrity, edema, etc.): Encouraged pt to walk with supervision while in the hospital three times per day.       Pertinent Vitals/Pain Pain Assessment: 0-10 Pain Score: 10-Worst pain ever Pain Location: abdomen Pain Intervention(s): Limited activity within patient's tolerance;Monitored during session;Repositioned;Premedicated before session           PT Goals (current goals can now be found in the care plan section) Acute Rehab PT Goals Patient Stated Goal: to get her pain and nausea under control and go home.  Progress towards PT goals: Goals met/education completed, patient discharged from PT    Frequency  Min 3X/week    PT Plan Current plan remains appropriate       End of Session   Activity Tolerance: Patient limited by fatigue Patient left: in bed;with call bell/phone within reach;with family/visitor present     Time: 1101-1133 (no charge for time with RN in room giving meds) PT Time Calculation (min) (ACUTE ONLY): 32 min  Charges:  $Gait Training: 8-22 mins  Barbarann Ehlers [place], [place], DPT [phone]   04/08/2015, 2:16 PM

## 2015-04-08 NOTE — Progress Notes (Signed)
Subjective: No diarrhea since yesterday morning. Continued generalized abdominal pain. Tolerating clear liquids thus far.  Objective: Vital signs in last 24 hours: Temp:  [97.4 F (36.3 C)-98.4 F (36.9 C)] 98.4 F (36.9 C) (08/02 0629) Pulse Rate:  [77-98] 91 (08/02 0930) Resp:  [11-42] 16 (08/02 0930) BP: (110-144)/(63-93) 118/64 mmHg (08/02 0629) SpO2:  [84 %-100 %] 98 % (08/02 0930) Weight change:  Last BM Date: 04/07/15  PE: GEN:  NAD ABD: Generalized tenderness without peritonitis  Lab Results: CBC    Component Value Date/Time   WBC 5.0 04/08/2015 0731   WBC 6.1 02/07/2014 1547   RBC 3.79* 04/08/2015 0731   RBC 4.77 02/07/2014 1547   HGB 10.7* 04/08/2015 0731   HCT 30.5* 04/08/2015 0731   PLT 212 04/08/2015 0731   MCV 80.5 04/08/2015 0731   MCH 28.2 04/08/2015 0731   MCH 28.1 02/07/2014 1547   MCHC 35.1 04/08/2015 0731   MCHC 34.1 02/07/2014 1547   RDW 13.5 04/08/2015 0731   RDW 14.7 02/07/2014 1547   LYMPHSABS 2.8 04/04/2015 0644   LYMPHSABS 2.1 02/07/2014 1547   MONOABS 0.6 04/04/2015 0644   EOSABS 0.0 04/04/2015 0644   EOSABS 0.1 02/07/2014 1547   BASOSABS 0.0 04/04/2015 0644   BASOSABS 0.0 02/07/2014 1547   CMP     Component Value Date/Time   NA 138 04/08/2015 0731   NA 142 08/07/2014 0922   K 3.4* 04/08/2015 0731   CL 108 04/08/2015 0731   CO2 23 04/08/2015 0731   GLUCOSE 83 04/08/2015 0731   GLUCOSE 90 08/07/2014 0922   BUN <5* 04/08/2015 0731   BUN 15 08/07/2014 0922   CREATININE 0.79 04/08/2015 0731   CALCIUM 8.1* 04/08/2015 0731   PROT 6.1* 04/04/2015 0644   PROT 7.7 08/07/2014 0922   ALBUMIN 3.3* 04/04/2015 0644   AST 14* 04/04/2015 0644   ALT 11* 04/04/2015 0644   ALKPHOS 45 04/04/2015 0644   BILITOT 1.2 04/04/2015 0644   GFRNONAA >60 04/08/2015 0731   GFRAA >60 04/08/2015 0731    Assessment:  1. Nausea (and prior vomiting). History gastroparesis. Certainly the large amounts of narcotics she is receiving for her back pain  and, now abdominal pain, is not helping. 2. Diarrhea. C. Diff negative. Unclear etiology. No diarrhea x 24 hours. 3. Abnormal CT abdomen, thickening and edema of descending colon.  Unrevealing sigmoidoscopy yesterday.  Plan:  1.  Advance diet. 2.  Awaiting stool tests (no diarrhea sample to obtain yet). 3.  Supportive management for nausea. 4.  Judicious analgesics, avoid laxatives (linaclotide). 5.  If does well with diet advancement and without recurrent diarrhea, hopefully home in 1-2 days from GI standpoint. 6.  Awaiting sigmoidoscopic biopsies.   Landry Dyke 04/08/2015, 10:27 AM   Pager [phone] If no answer or after 5 PM call [phone]

## 2015-04-08 NOTE — Progress Notes (Signed)
[facility] PROGRESS NOTE  EVEREST HACKING [id number]:[id number] DOB: [birth year] DOA: 04/02/2015 PCP: Donnie Coffin, MD  Assessment/Plan: #1 diabetic Gastroparesis with superimposed probable viral gastroenteritis versus colitis Patient with acute colitis noted on CT of the abdomen and pelvis. Patient had a leukocytosis which has since resolved. Patient with numerous loose stools yesterday per patient. Patient with no loose stools today. Patient endorses nausea. Patient still complaining of abdominal pain. Patient denies any emesis. Patient tolerating current diet. No significant change in abdominal pain. Patient tolerating clear liquids. Continue IV Reglan as per GI as to most of the issues is secondary to gastroparesis with underlying enteritis. IV Unasyn has been discontinued by GI. IV Flagyl was discontinued. Continue Protonix. Linzess has been discontinued. Patient has been seen by GI and patient s/p sigmoidoscopy on 04/07/2015 which was grossly normal. GI pathogen panel pending. Diet has been advanced per GI. Once patient is tolerating solid foods could possibly be discharged with outpatient follow-up. GI FF.   #2 Leukocytosis Likely secondary to acute colitis versus gastroenteritis. Leukocytosis resolved. Patient with nausea vomiting abdominal pain and diarrhea. Stool studies negative for C. difficile. Acute abdominal series is negative. CT of the abdomen and pelvis consistent with descending colitis. WBC within normal limits. IV Flagyl has been discontinued. IV Unasyn has been discontinued.   #3 chronic low back pain Continue home pain regimen. May need to back down on pain regimen.  #4 history of asthma Stable. Continue Symbicort.  #5 hypertension Blood pressure is borderline. Discontinued verapamil. IV fluids  #6 dental infection Patient was on amoxicillin prior to admission. D/C amoxicillin. D/C IV Unasyn. Patient has had about 5 days of antibiotic treatment. Outpatient follow-up.  #7  hypokalemia Secondary to GI losses. Replete.  #8 prophylaxis PPI for GI prophylaxis. Lovenox for DVT prophylaxis.  Code Status: Full Family Communication: Updated patient. No family at bedside. Disposition Plan: Home when no further nausea vomiting and tolerating oral intake.   Consultants:  Gastroenterology: Dr. Amedeo Plenty 04/04/2015  Procedures:  Acute abdominal series 04/02/2015  CT abdomen and pelvis 04/03/2015  Flexible sigmoidoscopy per Dr. Cristina Gong 04/07/2015  Antibiotics:  IV ciprofloxacin 04/03/2015>>>>>> 04/04/2015  IV Flagyl 04/03/2015>>>> 04/06/2015  IV Unasyn 04/04/2015>>>>>04/07/15  HPI/Subjective: Patient states no emesis. Patient with abdominal pain. Patient tolerating current diet.  patient patient states no loose bowel movement today.  Objective: Filed Vitals:   04/08/15 1433  BP: 105/74  Pulse: 111  Temp: 99.4 F (37.4 C)  Resp: 18    Intake/Output Summary (Last 24 hours) at 04/08/15 1733 Last data filed at 04/08/15 1436  Gross per 24 hour  Intake 3705.42 ml  Output   1675 ml  Net 2030.42 ml   Filed Weights   04/02/15 1349  Weight: 74.844 kg (165 lb)    Exam:   General:  NAD  Cardiovascular: RRR  Respiratory: CTAB  Abdomen: Soft/diffuse tenderness to palpation/nondistended/positive bowel sounds  Musculoskeletal: No c/c/e  Data Reviewed: Basic Metabolic Panel:  Recent Labs Lab 04/03/15 0715 04/04/15 0644 04/05/15 0500 04/06/15 0717 04/07/15 0644 04/08/15 0731  NA 139 139 141 139 139 138  K 3.9 4.2 3.7 3.3* 3.6 3.4*  CL 108 112* 115* 110 111 108  CO2 20* 21* 23 23 21* 23  GLUCOSE 117* 89 86 84 91 83  BUN <5* 10 8 <5* <5* <5*  CREATININE 0.70 0.84 0.82 0.87 0.78 0.79  CALCIUM 9.4 8.6* 8.3* 8.1* 8.2* 8.1*  MG 1.7 2.2  --   --   --  1.7  Liver Function Tests:  Recent Labs Lab 04/02/15 1427 04/03/15 0715 04/04/15 0644  AST 22 21 14*  ALT 13* 15 11*  ALKPHOS 52 62 45  BILITOT 0.6 0.8 1.2  PROT 7.6 7.4 6.1*   ALBUMIN 4.0 4.1 3.3*    Recent Labs Lab 04/02/15 1427  LIPASE 20*   No results for input(s): AMMONIA in the last 168 hours. CBC:  Recent Labs Lab 04/03/15 0715 04/04/15 0644 04/05/15 0500 04/06/15 0717 04/07/15 0644 04/08/15 0731  WBC 15.2* 8.8 5.8 5.3 4.5 5.0  NEUTROABS 13.3* 5.5  --   --   --   --   HGB 14.2 11.7* 10.5* 10.9* 10.5* 10.7*  HCT 40.8 33.4* 30.5* 31.1* 30.3* 30.5*  MCV 80.6 80.3 79.6 78.7 78.9 80.5  PLT 253 231 203 211 204 212   Cardiac Enzymes: No results for input(s): CKTOTAL, CKMB, CKMBINDEX, TROPONINI in the last 168 hours. BNP (last 3 results) No results for input(s): BNP in the last 8760 hours.  ProBNP (last 3 results) No results for input(s): PROBNP in the last 8760 hours.  CBG: No results for input(s): GLUCAP in the last 168 hours.  Recent Results (from the past 240 hour(s))  Clostridium Difficile by PCR (not at [facility])     Status: None   Collection Time: 04/05/15  2:47 PM  Result Value Ref Range Status   Toxigenic C Difficile by pcr NEGATIVE NEGATIVE Final     Studies: No results found.  Scheduled Meds: . amphetamine-dextroamphetamine  20 mg Oral Q breakfast  . amphetamine-dextroamphetamine  5 mg Oral Daily  . budesonide-formoterol  2 puff Inhalation BID  . cycloSPORINE  1 drop Both Eyes BID  . enoxaparin (LOVENOX) injection  40 mg Subcutaneous Daily  . estradiol  0.1 mg Transdermal Once per day on Mon Thu  . feeding supplement  1 Container Oral TID BM  . fentaNYL  75 mcg Transdermal Q48H  . fesoterodine  8 mg Oral Daily  . gabapentin  300 mg Oral TID  . loteprednol  1 drop Both Eyes Daily  . lurasidone  40 mg Oral QHS  . metoCLOPramide (REGLAN) injection  10 mg Intravenous 4 times per day  . pantoprazole (PROTONIX) IV  40 mg Intravenous Q24H  . pramipexole  1 mg Oral QHS  . saccharomyces boulardii  250 mg Oral BID  . tiZANidine  4 mg Oral BID  . topiramate  100 mg Oral Daily   And  . topiramate  200 mg Oral QHS   Continuous  Infusions: . dextrose 5 % and 0.45% NaCl 1,000 mL infusion 100 mL/hr at 04/08/15 0321  . sodium chloride 0.9 % 1,000 mL infusion 125 mL/hr at 04/05/15 0026    Principal Problem:   Gastroparesis due to DM Active Problems:   Enteritis   Chronic lower back pain   Nausea vomiting and diarrhea   Abdominal pain   Extrinsic asthma   Leukocytosis   Colitis, acute   Dental infection   Nausea with vomiting    Time spent: 3 mins    [facility] MD [facility] Pager [phone]. If 7PM-7AM, please contact night-coverage at [email or web address], password [facility] 04/08/2015, 5:33 PM  LOS: 6 days

## 2015-04-08 NOTE — Progress Notes (Signed)
Nutrition Follow-up  DOCUMENTATION CODES:   Obesity unspecified  INTERVENTION:   -Boost Breeze po TID, each supplement provides 250 kcal and 9 grams of protein -If prolonged NPO/clear liquid status is anticipated, consider initiation of nutrition support   NUTRITION DIAGNOSIS:   Inadequate oral intake related to altered GI function as evidenced by NPO status.  GOAL:   Patient will meet greater than or equal to 90% of their needs  MONITOR:   PO intake, Diet advancement, Labs, Weight trends, Skin, I & O's  REASON FOR ASSESSMENT:   Malnutrition Screening Tool    ASSESSMENT:   [name] is a 44 y.o. female history of gastroparesis, asthma and chronic pain presents to the ER because of recurrent episodes of nausea vomiting and diarrhea as morning. Patient's pain has been generalized all over the abdomen. Patient states she has had recurrent episodes of diarrhea and vomiting which were nonbloody. Patient was placed on amoxicillin yesterday but patient's dentist for tooth infection. Patient took one dose of it. On exam patient's abdomen is soft but tender generalized with no guarding or rigidity or any peritoneal signs. Acute abdominal series is unremarkable. Patient has been admitted for further management.   Pt in with MD at time of visit.  Pt underwent sigmoidoscopy on 04/07/15; awaiting pathology results.   Pt has been NPO/clear liquids throughout hospital stay. Intake has remained poor, due to nausea and vomiting. Noted 0% meal completion. Plan is to advance diet, per GI notes. Per staff, pt has been able to tolerate clear liquids today.   RD will add Boost Breeze supplement to optimize nutritional status.   Labs reviewed: K: 3.4.   Diet Order:  Diet clear liquid Room service appropriate?: Yes; Fluid consistency:: Thin  Skin:  Reviewed, no issues  Last BM:  04/07/15  Height:   Ht Readings from Last 1 Encounters:  04/02/15 5\' 2"  (1.575 m)    Weight:   Wt  Readings from Last 1 Encounters:  04/02/15 165 lb (74.844 kg)    Ideal Body Weight:  50 kg  BMI:  Body mass index is 30.17 kg/(m^2).  Estimated Nutritional Needs:   Kcal:  1650-1850  Protein:  80-90 grams  Fluid:  1.6-1.8 L  EDUCATION NEEDS:   Education needs addressed  Jaleeyah Munce A. Jimmye Norman, RD, LDN, CDE Pager: [phone] After hours Pager: [phone]

## 2015-04-09 ENCOUNTER — Inpatient Hospital Stay (HOSPITAL_COMMUNITY): Payer: Federal, State, Local not specified - PPO

## 2015-04-09 ENCOUNTER — Encounter (HOSPITAL_COMMUNITY): Payer: Self-pay

## 2015-04-09 DIAGNOSIS — R51 Headache: Secondary | ICD-10-CM

## 2015-04-09 LAB — BASIC METABOLIC PANEL
Anion gap: 7 (ref 5–15)
CHLORIDE: 111 mmol/L (ref 101–111)
CO2: 20 mmol/L — ABNORMAL LOW (ref 22–32)
CREATININE: 0.9 mg/dL (ref 0.44–1.00)
Calcium: 8.1 mg/dL — ABNORMAL LOW (ref 8.9–10.3)
GFR calc Af Amer: >60 mL/min (ref >60)
GFR calc non Af Amer: >60 mL/min (ref >60)
GLUCOSE: 91 mg/dL (ref 65–99)
POTASSIUM: 3.7 mmol/L (ref 3.5–5.1)
SODIUM: 138 mmol/L (ref 135–145)

## 2015-04-09 LAB — CBC
HCT: 30.5 % — ABNORMAL LOW (ref 36.0–46.0)
Hemoglobin: 10.9 g/dL — ABNORMAL LOW (ref 12.0–15.0)
MCH: 28.2 pg (ref 26.0–34.0)
MCHC: 35.7 g/dL (ref 30.0–36.0)
MCV: 79 fL (ref 78.0–100.0)
Platelets: 216 10*3/uL (ref 150–400)
RBC: 3.86 MIL/uL — ABNORMAL LOW (ref 3.87–5.11)
RDW: 13.5 % (ref 11.5–15.5)
WBC: 5.3 10*3/uL (ref 4.0–10.5)

## 2015-04-09 LAB — MAGNESIUM: MAGNESIUM: 1.6 mg/dL — AB (ref 1.7–2.4)

## 2015-04-09 MED ORDER — PANTOPRAZOLE SODIUM 40 MG PO TBEC
40.0000 mg | DELAYED_RELEASE_TABLET | Freq: Every day | ORAL | Status: DC
  Administered 2015-04-09 – 2015-04-10 (×2): 40 mg via ORAL
  Filled 2015-04-09 (×2): qty 1

## 2015-04-09 NOTE — Progress Notes (Addendum)
[facility] PROGRESS NOTE  BEYONCA Padilla WSF:[id number] DOB: 1971-03-13 DOA: 04/02/2015 PCP: Donnie Coffin, MD  Brief history of present illness 44 year old African-American female with history of gastroparesis, presented with nausea, vomiting and diarrhea. Gastroenterology is following. She status post sigmoidoscopy. Diet being advanced today. Continues to be on IV Reglan. Can be changed over to oral Reglan tomorrow if she tolerates her soft diet, which was started today.  Assessment/Plan:  Gastroparesis with superimposed probable viral gastroenteritis versus colitis Patient with acute colitis noted on CT of the abdomen and pelvis. Patient had a leukocytosis which has since resolved. Patient continues to have numerous loose stools. This is especially postprandial. Has been tolerating her full liquids without any nausea or vomiting. Patient still complaining of abdominal pain. No significant change in abdominal pain. Advance to soft diet. Continue IV Reglan as per GI as to most of the issues is secondary to gastroparesis with underlying enteritis. Change to oral Reglan tomorrow if she tolerates her diet. IV Unasyn has been discontinued by GI. IV Flagyl was discontinued. Continue Protonix. Linzess has been discontinued. Patient has been seen by GI and patient s/p sigmoidoscopy on 04/07/2015 which was grossly normal. GI pathogen panel has not been sent yet.   Headache Patient is very concerned about her headache. Denies any numbness or tingling in her arms or legs. No focal weakness. We will get a CT head.  Leukocytosis Likely secondary to acute colitis versus gastroenteritis. Leukocytosis resolved. Patient with nausea vomiting abdominal pain and diarrhea. Stool studies negative for C. difficile. Acute abdominal series is negative. CT of the abdomen and pelvis consistent with descending colitis. WBC within normal limits. IV Flagyl has been discontinued. IV Unasyn has been discontinued.    Chronic low back pain Continue home pain regimen. Narcotic use is also contributing to her GI symptoms.  History of asthma Stable. Continue Symbicort.   Essential hypertension Blood pressure is borderline. Discontinued verapamil. IV fluids  Dental infection Patient was on amoxicillin prior to admission. D/C amoxicillin. D/C IV Unasyn. Patient has had about 5 days of antibiotic treatment. Outpatient follow-up.  Hypokalemia Secondary to GI losses. Repleted.  Prophylaxis PPI for GI prophylaxis. Lovenox for DVT prophylaxis.  Code Status: Full Family Communication: Updated patient. Her daughter is at bedside. Disposition Plan: Home when no further nausea vomiting and tolerating oral intake.   Consultants:  Gastroenterology: Dr. Amedeo Plenty 04/04/2015  Procedures:  Acute abdominal series 04/02/2015  CT abdomen and pelvis 04/03/2015  Flexible sigmoidoscopy per Dr. Cristina Gong 04/07/2015  Antibiotics:  IV ciprofloxacin 04/03/2015>>>>>> 04/04/2015  IV Flagyl 04/03/2015>>>> 04/06/2015  IV Unasyn 04/04/2015>>>>>04/07/15  Subjective: Patient continues to complain of loose stools especially after she eats and drinks. Pain is about the same. Concerned about her headache. No nausea, vomiting.   Objective: Filed Vitals:   04/09/15 0647  BP: 112/67  Pulse: 93  Temp: 98.3 F (36.8 C)  Resp: 18    Intake/Output Summary (Last 24 hours) at 04/09/15 1003 Last data filed at 04/08/15 2130  Gross per 24 hour  Intake    960 ml  Output   1625 ml  Net   -665 ml   Filed Weights   04/02/15 1349  Weight: 74.844 kg (165 lb)    Exam:   General:  NAD  Cardiovascular: S1, S2 is normal, regular. No S3, S4. No rubs, murmurs or bruits. No real edema  Respiratory: Clear to auscultation bilaterally  Abdomen: Soft/diffuse tenderness to palpation/nondistended/positive bowel sounds  Alert and oriented 3. No facial asymmetry. Speech  appears to be normal. Tongue is midline. Motor strength  equal bilateral upper and lower extremities.  Data Reviewed: Basic Metabolic Panel:  Recent Labs Lab 04/03/15 0715 04/04/15 3662 04/05/15 0500 04/06/15 0717 04/07/15 0644 04/08/15 0731 04/09/15 0540  NA 139 139 141 139 139 138 138  K 3.9 4.2 3.7 3.3* 3.6 3.4* 3.7  CL 108 112* 115* 110 111 108 111  CO2 20* 21* 23 23 21* 23 20*  GLUCOSE 117* 89 86 84 91 83 91  BUN <5* 10 8 <5* <5* <5* <5*  CREATININE 0.70 0.84 0.82 0.87 0.78 0.79 0.90  CALCIUM 9.4 8.6* 8.3* 8.1* 8.2* 8.1* 8.1*  MG 1.7 2.2  --   --   --  1.7 1.6*   Liver Function Tests:  Recent Labs Lab 04/02/15 1427 04/03/15 0715 04/04/15 0644  AST 22 21 14*  ALT 13* 15 11*  ALKPHOS 52 62 45  BILITOT 0.6 0.8 1.2  PROT 7.6 7.4 6.1*  ALBUMIN 4.0 4.1 3.3*    Recent Labs Lab 04/02/15 1427  LIPASE 20*   CBC:  Recent Labs Lab 04/03/15 0715 04/04/15 0644 04/05/15 0500 04/06/15 0717 04/07/15 0644 04/08/15 0731 04/09/15 0540  WBC 15.2* 8.8 5.8 5.3 4.5 5.0 5.3  NEUTROABS 13.3* 5.5  --   --   --   --   --   HGB 14.2 11.7* 10.5* 10.9* 10.5* 10.7* 10.9*  HCT 40.8 33.4* 30.5* 31.1* 30.3* 30.5* 30.5*  MCV 80.6 80.3 79.6 78.7 78.9 80.5 79.0  PLT 253 231 203 211 204 212 216    Recent Results (from the past 240 hour(s))  Clostridium Difficile by PCR (not at [facility])     Status: None   Collection Time: 04/05/15  2:47 PM  Result Value Ref Range Status   Toxigenic C Difficile by pcr NEGATIVE NEGATIVE Final     Studies: No results found.  Scheduled Meds: . amphetamine-dextroamphetamine  20 mg Oral Q breakfast  . amphetamine-dextroamphetamine  5 mg Oral Daily  . budesonide-formoterol  2 puff Inhalation BID  . cycloSPORINE  1 drop Both Eyes BID  . enoxaparin (LOVENOX) injection  40 mg Subcutaneous Daily  . estradiol  0.1 mg Transdermal Once per day on Mon Thu  . feeding supplement  1 Container Oral TID BM  . fentaNYL  75 mcg Transdermal Q48H  . fesoterodine  8 mg Oral Daily  . gabapentin  300 mg Oral TID  .  loteprednol  1 drop Both Eyes Daily  . lurasidone  40 mg Oral QHS  . metoCLOPramide (REGLAN) injection  10 mg Intravenous 4 times per day  . pantoprazole  40 mg Oral Daily  . pramipexole  1 mg Oral QHS  . saccharomyces boulardii  250 mg Oral BID  . tiZANidine  4 mg Oral BID  . topiramate  100 mg Oral Daily   And  . topiramate  200 mg Oral QHS   Continuous Infusions: . dextrose 5 % and 0.45% NaCl 1,000 mL infusion 100 mL/hr at 04/08/15 0321    Principal Problem:   Gastroparesis due to DM Active Problems:   Chronic lower back pain   Nausea vomiting and diarrhea   Abdominal pain   Extrinsic asthma   Leukocytosis   Colitis, acute   Dental infection   Nausea with vomiting   Enteritis   Time spent: 5 mins  Sylvio Weatherall MD [facility] Pager [phone].   If 7PM-7AM, please contact night-coverage at [email or web address], password [facility] 04/09/2015, 10:03 AM  LOS:  7 days

## 2015-04-09 NOTE — Progress Notes (Signed)
Subjective: Still complains of diffuse abdominal pains. Still complains of post-prandial diarrhea.  Objective: Vital signs in last 24 hours: Temp:  [98.3 F (36.8 C)-99.4 F (37.4 C)] 98.3 F (36.8 C) (08/03 0647) Pulse Rate:  [61-111] 93 (08/03 0647) Resp:  [16-19] 18 (08/03 0647) BP: (101-112)/(67-74) 112/67 mmHg (08/03 0647) SpO2:  [96 %-100 %] 100 % (08/03 0647) Weight change:  Last BM Date: 04/08/15  PE: GEN:  NAD ABD:  Soft, mild diffuse tenderness without peritonitis.  Lab Results: CBC    Component Value Date/Time   WBC 5.3 04/09/2015 0540   WBC 6.1 02/07/2014 1547   RBC 3.86* 04/09/2015 0540   RBC 4.77 02/07/2014 1547   HGB 10.9* 04/09/2015 0540   HCT 30.5* 04/09/2015 0540   PLT 216 04/09/2015 0540   MCV 79.0 04/09/2015 0540   MCH 28.2 04/09/2015 0540   MCH 28.1 02/07/2014 1547   MCHC 35.7 04/09/2015 0540   MCHC 34.1 02/07/2014 1547   RDW 13.5 04/09/2015 0540   RDW 14.7 02/07/2014 1547   LYMPHSABS 2.8 04/04/2015 0644   LYMPHSABS 2.1 02/07/2014 1547   MONOABS 0.6 04/04/2015 0644   EOSABS 0.0 04/04/2015 0644   EOSABS 0.1 02/07/2014 1547   BASOSABS 0.0 04/04/2015 0644   BASOSABS 0.0 02/07/2014 1547   CMP     Component Value Date/Time   NA 138 04/09/2015 0540   NA 142 08/07/2014 0922   K 3.7 04/09/2015 0540   CL 111 04/09/2015 0540   CO2 20* 04/09/2015 0540   GLUCOSE 91 04/09/2015 0540   GLUCOSE 90 08/07/2014 0922   BUN <5* 04/09/2015 0540   BUN 15 08/07/2014 0922   CREATININE 0.90 04/09/2015 0540   CALCIUM 8.1* 04/09/2015 0540   PROT 6.1* 04/04/2015 0644   PROT 7.7 08/07/2014 0922   ALBUMIN 3.3* 04/04/2015 0644   AST 14* 04/04/2015 0644   ALT 11* 04/04/2015 0644   ALKPHOS 45 04/04/2015 0644   BILITOT 1.2 04/04/2015 0644   GFRNONAA >60 04/09/2015 0540   GFRAA >60 04/09/2015 0540   Assessment:  1. Nausea (and prior vomiting). History gastroparesis. Improving.  Certainly the large amounts of narcotics she is receiving for her back pain  and, now abdominal pain, is not helping. 2. Diarrhea. C. Diff negative. GI pathogen panel pending. 3. Abnormal CT abdomen, thickening and edema of descending colon. Unrevealing sigmoidoscopy yesterday, but biopsies suggestive of possible resolving ischemic changes.  Plan:  1.  Advance diet. 2.  Await stool studies. 3.  Minimize narcotics. 4.  I doubt resolution of her pain or diarrhea is a reasonable discharge goal.  If she tolerates advancement of diet without prohibitive nausea and vomiting, and if GI pathogen panel is unrevealing, then I feel patient can likely be discharged home. 5.  Will follow.   Landry Dyke 04/09/2015, 8:40 AM   Pager [phone] If no answer or after 5 PM call [phone]

## 2015-04-10 DIAGNOSIS — R1084 Generalized abdominal pain: Secondary | ICD-10-CM

## 2015-04-10 DIAGNOSIS — D72829 Elevated white blood cell count, unspecified: Secondary | ICD-10-CM

## 2015-04-10 DIAGNOSIS — E1143 Type 2 diabetes mellitus with diabetic autonomic (poly)neuropathy: Principal | ICD-10-CM

## 2015-04-10 DIAGNOSIS — K529 Noninfective gastroenteritis and colitis, unspecified: Secondary | ICD-10-CM

## 2015-04-10 DIAGNOSIS — R112 Nausea with vomiting, unspecified: Secondary | ICD-10-CM

## 2015-04-10 LAB — BASIC METABOLIC PANEL
Anion gap: 6 (ref 5–15)
CALCIUM: 7.9 mg/dL — AB (ref 8.9–10.3)
CHLORIDE: 109 mmol/L (ref 101–111)
CO2: 25 mmol/L (ref 22–32)
Creatinine, Ser: 0.79 mg/dL (ref 0.44–1.00)
GFR calc Af Amer: >60 mL/min (ref >60)
GLUCOSE: 101 mg/dL — AB (ref 65–99)
POTASSIUM: 3.7 mmol/L (ref 3.5–5.1)
SODIUM: 140 mmol/L (ref 135–145)

## 2015-04-10 MED ORDER — PANTOPRAZOLE SODIUM 40 MG PO TBEC
40.0000 mg | DELAYED_RELEASE_TABLET | Freq: Every day | ORAL | Status: DC
Start: 2015-04-10 — End: 2016-11-14

## 2015-04-10 MED ORDER — METOCLOPRAMIDE HCL 5 MG PO TABS: 5.0000 mg | ORAL_TABLET | Freq: Four times a day (QID) | ORAL | Status: DC | PRN

## 2015-04-10 MED ORDER — FLUCONAZOLE 100 MG PO TABS
150.0000 mg | ORAL_TABLET | Freq: Once | ORAL | Status: AC
  Administered 2015-04-10: 150 mg via ORAL
  Filled 2015-04-10: qty 2

## 2015-04-10 MED ORDER — SACCHAROMYCES BOULARDII 250 MG PO CAPS: 250.0000 mg | ORAL_CAPSULE | Freq: Two times a day (BID) | ORAL | Status: DC

## 2015-04-10 NOTE — Progress Notes (Signed)
Patient was discharged home by MD order; discharged instructions  review and give to patient with care notes; IV DIC; skin intact; patient will be escorted to the car by volunteer via wheelchair.

## 2015-04-10 NOTE — Progress Notes (Signed)
Subjective: Continues to endorse abdominal pain and diarrhea, unchanged for several days.  Objective: Vital signs in last 24 hours: Temp:  [98.7 F (37.1 C)-99.6 F (37.6 C)] 98.7 F (37.1 C) (08/04 0631) Pulse Rate:  [88-115] 90 (08/04 0631) Resp:  [16-18] 18 (08/04 0631) BP: (84-99)/(43-60) 84/44 mmHg (08/04 0631) SpO2:  [96 %-99 %] 96 % (08/04 0631) Weight change:  Last BM Date: 04/09/15  PE: GEN:  Flat affect, NAD  Lab Results: CBC    Component Value Date/Time   WBC 5.3 04/09/2015 0540   WBC 6.1 02/07/2014 1547   RBC 3.86* 04/09/2015 0540   RBC 4.77 02/07/2014 1547   HGB 10.9* 04/09/2015 0540   HCT 30.5* 04/09/2015 0540   PLT 216 04/09/2015 0540   MCV 79.0 04/09/2015 0540   MCH 28.2 04/09/2015 0540   MCH 28.1 02/07/2014 1547   MCHC 35.7 04/09/2015 0540   MCHC 34.1 02/07/2014 1547   RDW 13.5 04/09/2015 0540   RDW 14.7 02/07/2014 1547   LYMPHSABS 2.8 04/04/2015 0644   LYMPHSABS 2.1 02/07/2014 1547   MONOABS 0.6 04/04/2015 0644   EOSABS 0.0 04/04/2015 0644   EOSABS 0.1 02/07/2014 1547   BASOSABS 0.0 04/04/2015 0644   BASOSABS 0.0 02/07/2014 1547   CMP     Component Value Date/Time   NA 140 04/10/2015 0523   NA 142 08/07/2014 0922   K 3.7 04/10/2015 0523   CL 109 04/10/2015 0523   CO2 25 04/10/2015 0523   GLUCOSE 101* 04/10/2015 0523   GLUCOSE 90 08/07/2014 0922   BUN <5* 04/10/2015 0523   BUN 15 08/07/2014 0922   CREATININE 0.79 04/10/2015 0523   CALCIUM 7.9* 04/10/2015 0523   PROT 6.1* 04/04/2015 0644   PROT 7.7 08/07/2014 0922   ALBUMIN 3.3* 04/04/2015 0644   AST 14* 04/04/2015 0644   ALT 11* 04/04/2015 0644   ALKPHOS 45 04/04/2015 0644   BILITOT 1.2 04/04/2015 0644   GFRNONAA >60 04/10/2015 0523   GFRAA >60 04/10/2015 0523   Assessment:  1. Nausea (and prior vomiting). History gastroparesis. Improving. Certainly the large amounts of narcotics she is receiving for her back pain and, now abdominal pain, is not helping. 2. Diarrhea. C.  Diff negative. GI pathogen panel pending. 3. Abnormal CT abdomen, thickening and edema of descending colon. Unrevealing sigmoidoscopy yesterday, but biopsies suggestive of possible resolving ischemic changes.  Plan:  1.  I suspect patient's abdominal pain and diarrhea might be part of either resolving infectious or ischemic process; but also think there might be functional component to these symptoms as well.  I don't think there is much benefit from continued hospitalization for these symptoms, and they will likely take several more days to resolve with expectant management. 2.  Awaiting GI pathogen panel results. 3.  Would transition to metoclopramide 5 mg po qac/qhs prn, and discharge with #30 day supply of this medication. 4.  Continue pantoprazole 40 mg po qd upon discharge. 5.  Florastor 250 mg po bid x 6 weeks. 6.  Minimize narcotics. 7.  OK to discharge home from GI perspective; will sign-off; will arrange outpatient follow-up with patient's primary gastroenterologist (Dr. Pam[facility], [phone]); thank you for the consultation.    Landry Dyke 04/10/2015, 9:22 AM   Pager [phone] If no answer or after 5 PM call [phone]

## 2015-04-10 NOTE — Discharge Summary (Signed)
Physician Discharge Summary  [name] MCN:[id number] DOB: Jan 03, 1971 DOA: 04/02/2015  PCP: Donnie Coffin, MD  Admit date: 04/02/2015 Discharge date: 04/10/2015  Recommendations for Outpatient Follow-up:  1. Pt will need to follow up with PCP in 2 weeks post discharge 2. Follow up GI, Dr. Michail Sermon in 2 weeks 3. Please follow up on results of GI pathogen panel  Discharge Diagnoses:  Gastroparesis with superimposed probable viral gastroenteritis versus colitis Patient with acute colitis noted on CT of the abdomen and pelvis which showed thickening and edema of the descending colon. Patient had a leukocytosis which has since resolved. Patient continues to have numerous loose stools. This is especially postprandial. Has been tolerating her full liquids without any nausea or vomiting. Patient still complaining of abdominal pain. No significant change in abdominal pain. Advance to soft diet. Continue IV Reglan as per GI as to most of the issues is secondary to gastroparesis with underlying enteritis.  IV Unasyn has been discontinued by GI. IV Flagyl was discontinued. Continue Protonix. Linzess has been discontinued. Patient has been seen by GI and patient s/p sigmoidoscopy on 04/07/2015 which was grossly normal. GI pathogen panel has been sent but results are pending -On the day of discharge, the patient was cleared by GI for discharge with recommendations to continue metoclopramide 5 mg q ac/hs and protonix 40mg  daily and florastor 250 mg bid x 6 weeks -GI suspects patient's abdominal pain and diarrhea might be part of either resolving infectious or ischemic process; but also think there might be functional component to these symptoms  Headache Patient is very concerned about her headache. Denies any numbness or tingling in her arms or legs. No focal weakness. We will get a CT head--neg for acute findings  Leukocytosis Likely secondary to acute colitis versus gastroenteritis. Leukocytosis  resolved. Patient with nausea vomiting abdominal pain and diarrhea. Stool studies negative for C. difficile. Acute abdominal series is negative. CT of the abdomen and pelvis consistent with descending colitis. WBC within normal limits. IV Flagyl has been discontinued. IV Unasyn has been discontinued.   Chronic low back pain Continue home pain regimen. Narcotic use is also contributing to her GI symptoms.  History of asthma Stable. Continue Symbicort.  Essential hypertension Blood pressure is borderline. Discontinued verapamil. IV fluids  Dental infection Patient was on amoxicillin prior to admission. D/C amoxicillin. D/C IV Unasyn. Patient has had about 5 days of antibiotic treatment. Outpatient follow-up.  Hypokalemia Secondary to GI losses. Repleted.  Prophylaxis PPI for GI prophylaxis. Lovenox for DVT prophylaxis.  Code Status: Full Family Communication: Updated patient. Her daughter is at bedside. Disposition Plan: Home when no further nausea vomiting and tolerating oral intake.   Consultants:  Gastroenterology: Dr. Amedeo Plenty 04/04/2015  Procedures:  Acute abdominal series 04/02/2015  CT abdomen and pelvis 04/03/2015  Flexible sigmoidoscopy per Dr. Cristina Gong 04/07/2015  Antibiotics:  IV ciprofloxacin 04/03/2015>>>>>> 04/04/2015  IV Flagyl 04/03/2015>>>> 04/06/2015  IV Unasyn 04/04/2015>>>>>04/07/15  Discharge Condition: stable  Disposition: home  Diet:soft Wt Readings from Last 3 Encounters:  04/02/15 74.844 kg (165 lb)  02/26/15 72.576 kg (160 lb)  08/07/14 74.39 kg (164 lb)    History of present illness:  44 year old African-American female with history of gastroparesis, presented with nausea, vomiting and diarrhea. Gastroenterology is following. She status post sigmoidoscopy. Diet being advanced today. Continues to be on IV Reglan. Can be changed over to oral Reglan tomorrow if she tolerates her soft diet, which was started  today.  Consultants: GI--[facility]  Discharge Exam: Filed Vitals:  04/10/15 0631  BP: 84/44  Pulse: 90  Temp: 98.7 F (37.1 C)  Resp: 18   Filed Vitals:   04/09/15 1444 04/09/15 1957 04/09/15 2243 04/10/15 0631  BP: 99/60  91/43 84/44  Pulse: 109 88 115 90  Temp:   99 F (37.2 C) 98.7 F (37.1 C)  TempSrc:   Oral Oral  Resp:  16 18 18   Height:      Weight:      SpO2:  99% 96% 96%   General: A&O x 3, NAD, pleasant, cooperative Cardiovascular: RRR, no rub, no gallop, no S3 Respiratory: CTAB, no wheeze, no rhonchi Abdomen:soft, periumbilical tender without rebound, nondistended, positive bowel sounds Extremities: No edema, No lymphangitis, no petechiae  Discharge Instructions      Discharge Instructions    Diet - low sodium heart healthy    Complete by:  As directed      Increase activity slowly    Complete by:  As directed             Medication List    STOP taking these medications        LINZESS 290 MCG Caps capsule  Generic drug:  Linaclotide      TAKE these medications        amphetamine-dextroamphetamine 20 MG 24 hr capsule  Commonly known as:  ADDERALL XR  Take 1 capsule (20 mg total) by mouth daily.     amphetamine-dextroamphetamine 5 MG tablet  Commonly known as:  ADDERALL  Take 1 tablet daily at 1PM.     BOTOX IJ  Inject as directed every 3 (three) months.     budesonide-formoterol 160-4.5 MCG/ACT inhaler  Commonly known as:  SYMBICORT  Inhale 2 puffs into the lungs 2 (two) times daily.     CLOBEX 0.05 % shampoo  Generic drug:  Clobetasol Propionate  Apply 1 application topically every Wednesday.     cycloSPORINE 0.05 % ophthalmic emulsion  Commonly known as:  RESTASIS  Place 1 drop into both eyes every 12 (twelve) hours.     DEXILANT 60 MG capsule  Generic drug:  dexlansoprazole  Take 60 mg by mouth daily.     fentaNYL 75 MCG/HR  Commonly known as:  DURAGESIC - dosed mcg/hr  Place 75 mcg onto the skin every other day.      FLUOCINOLONE ACETONIDE SCALP 0.01 % Oil  Apply 1 application topically daily.     fluocinonide cream 0.05 %  Commonly known as:  LIDEX  Apply 1 application topically 3 (three) times a week.     hydrochlorothiazide 25 MG tablet  Commonly known as:  HYDRODIURIL  Take 25 mg by mouth every morning.     HYPERCARE 20 % external solution  Generic drug:  aluminum chloride  Apply 1 application topically daily.     LATUDA 40 MG Tabs tablet  Generic drug:  lurasidone  Take 40 mg by mouth at bedtime.     loteprednol 0.2 % Susp  Commonly known as:  LOTEMAX  Place 1 drop into both eyes daily.     metoCLOPramide 5 MG tablet  Commonly known as:  REGLAN  Take 1 tablet (5 mg total) by mouth every 6 (six) hours as needed for nausea.     MINIVELLE 0.1 MG/24HR patch  Generic drug:  estradiol  Apply 1 patch topically 2 (two) times a week.     MOBIC 15 MG tablet  Generic drug:  meloxicam  Take 15 mg by mouth daily.  NEURONTIN 300 MG capsule  Generic drug:  gabapentin  Take 300 mg by mouth 3 (three) times daily.     pantoprazole 40 MG tablet  Commonly known as:  PROTONIX  Take 1 tablet (40 mg total) by mouth daily.     PATADAY 0.2 % Soln  Generic drug:  Olopatadine HCl  Place 1 drop into both eyes 2 (two) times daily as needed (for allergies).     pramipexole 1 MG tablet  Commonly known as:  MIRAPEX  Take 1 mg by mouth at bedtime.     promethazine 25 MG tablet  Commonly known as:  PHENERGAN  Take 25 mg by mouth every 6 (six) hours as needed for nausea or vomiting.     RELPAX 40 MG tablet  Generic drug:  eletriptan  Take 40 mg by mouth every 2 (two) hours as needed for migraine or headache.     saccharomyces boulardii 250 MG capsule  Commonly known as:  FLORASTOR  Take 1 capsule (250 mg total) by mouth 2 (two) times daily.     tiZANidine 4 MG tablet  Commonly known as:  ZANAFLEX  Take 4 mg by mouth 2 (two) times daily.     TOPAMAX 100 MG tablet  Generic drug:  topiramate   Take 100-200 mg by mouth 2 (two) times daily. 1 tab every AM and 2 at night     TOVIAZ 8 MG Tb24 tablet  Generic drug:  fesoterodine  Take 8 mg by mouth daily.     triamcinolone cream 0.1 %  Commonly known as:  KENALOG  Apply 1 application topically daily as needed (to affected area).     verapamil 240 MG 24 hr capsule  Commonly known as:  VERELAN PM  Take 240 mg by mouth at bedtime.         The results of significant diagnostics from this hospitalization (including imaging, microbiology, ancillary and laboratory) are listed below for reference.    Significant Diagnostic Studies: Ct Head Wo Contrast  04/09/2015   CLINICAL DATA:  Headache for days.  History of migraine.  EXAM: CT HEAD WITHOUT CONTRAST  TECHNIQUE: Contiguous axial images were obtained from the base of the skull through the vertex without intravenous contrast.  COMPARISON:  None.  FINDINGS: Skull and Sinuses:Negative for fracture or destructive process. The mastoids, middle ears, and imaged paranasal sinuses are clear.  Orbits: No acute abnormality.  Brain: No acute or remote infarction, hemorrhage, hydrocephalus, or mass lesion/mass effect.  IMPRESSION: Normal head CT.   Electronically Signed   By: Monte Fantasia M.D.   On: 04/09/2015 10:59   Ct Abdomen Pelvis W Contrast  04/03/2015   CLINICAL DATA:  Abdominal pain with nausea, vomiting, and diarrhea.  EXAM: CT ABDOMEN AND PELVIS WITH CONTRAST  TECHNIQUE: Multidetector CT imaging of the abdomen and pelvis was performed using the standard protocol following bolus administration of intravenous contrast.  CONTRAST:  84 cc OMNIPAQUE IOHEXOL 300 MG/ML  SOLN  COMPARISON:  Radiographs dated 04/02/2015 and CT scan dated 10/19/2013  FINDINGS: Lower chest:  Normal.  Hepatobiliary: Cholecystectomy.  Otherwise normal.  Pancreas: Normal.  Spleen: Normal.  Adrenals/Urinary Tract: Normal.  Stomach/Bowel: There is subtle edema and slight soft tissue stranding around the descending colon  consistent with colitis. The ascending and transverse portions of the colon appear normal. Small bowel is normal. Appendix is normal.  Stomach appears normal.  Vascular/Lymphatic: Normal.  Reproductive: Uterus and ovaries appear to have been removed.  Other: Tiny amount of  free fluid in the pelvis.  No free air.  Musculoskeletal: No acute abnormality.  Prior lower lumbar surgery.  IMPRESSION: Edema of the mucosa of the descending colon with slight soft tissue stranding around the portion of the colon consistent with colitis.   Electronically Signed   By: Lorriane Shire M.D.   On: 04/03/2015 21:48   Dg Abd Acute W/chest  04/02/2015   CLINICAL DATA:  Abdominal pain for 1 day.  EXAM: DG ABDOMEN ACUTE W/ 1V CHEST  COMPARISON:  CT 10/19/2013  FINDINGS: The cardiomediastinal contours are normal. The lungs are clear. There is no free intra-abdominal air. No dilated bowel loops to suggest obstruction. Scattered air throughout normal caliber small bowel loops in the right abdomen, there is otherwise a paucity of bowel gas. Surgical clips in the right upper quadrant of the abdomen from cholecystectomy. Post-surgical change in the lower lumbar spine. No radiopaque calculi. No acute osseous abnormalities are seen.  IMPRESSION: 1. No radiographic findings of bowel obstruction or free air. 2. Air within normal caliber small bowel loops in the right abdomen, there is otherwise a paucity of bowel gas.   Electronically Signed   By: Jeb Levering M.D.   On: 04/02/2015 21:57     Microbiology: Recent Results (from the past 240 hour(s))  Clostridium Difficile by PCR (not at [facility])     Status: None   Collection Time: 04/05/15  2:47 PM  Result Value Ref Range Status   Toxigenic C Difficile by pcr NEGATIVE NEGATIVE Final     Labs: Basic Metabolic Panel:  Recent Labs Lab 04/04/15 0644  04/06/15 0717 04/07/15 0644 04/08/15 0731 04/09/15 0540 04/10/15 0523  NA 139  < > 139 139 138 138 140  K 4.2  < > 3.3* 3.6 3.4*  3.7 3.7  CL 112*  < > 110 111 108 111 109  CO2 21*  < > 23 21* 23 20* 25  GLUCOSE 89  < > 84 91 83 91 101*  BUN 10  < > <5* <5* <5* <5* <5*  CREATININE 0.84  < > 0.87 0.78 0.79 0.90 0.79  CALCIUM 8.6*  < > 8.1* 8.2* 8.1* 8.1* 7.9*  MG 2.2  --   --   --  1.7 1.6*  --   < > = values in this interval not displayed. Liver Function Tests:  Recent Labs Lab 04/04/15 0644  AST 14*  ALT 11*  ALKPHOS 45  BILITOT 1.2  PROT 6.1*  ALBUMIN 3.3*   No results for input(s): LIPASE, AMYLASE in the last 168 hours. No results for input(s): AMMONIA in the last 168 hours. CBC:  Recent Labs Lab 04/04/15 0644 04/05/15 0500 04/06/15 0717 04/07/15 0644 04/08/15 0731 04/09/15 0540  WBC 8.8 5.8 5.3 4.5 5.0 5.3  NEUTROABS 5.5  --   --   --   --   --   HGB 11.7* 10.5* 10.9* 10.5* 10.7* 10.9*  HCT 33.4* 30.5* 31.1* 30.3* 30.5* 30.5*  MCV 80.3 79.6 78.7 78.9 80.5 79.0  PLT 231 203 211 204 212 216   Cardiac Enzymes: No results for input(s): CKTOTAL, CKMB, CKMBINDEX, TROPONINI in the last 168 hours. BNP: Invalid input(s): POCBNP CBG: No results for input(s): GLUCAP in the last 168 hours.  Time coordinating discharge:  Greater than 30 minutes  Signed:  Rosy Estabrook, DO [facility] Pager: [phone] 04/10/2015, 10:26 AM

## 2015-04-11 LAB — GI PATHOGEN PANEL BY PCR, STOOL
C DIFFICILE TOXIN A/B: NOT DETECTED
Campylobacter by PCR: NOT DETECTED
Cryptosporidium by PCR: NOT DETECTED
E COLI (ETEC) LT/ST: NOT DETECTED
E coli (STEC): NOT DETECTED
E coli 0157 by PCR: NOT DETECTED
G lamblia by PCR: NOT DETECTED
Norovirus GI/GII: NOT DETECTED
Rotavirus A by PCR: NOT DETECTED
Salmonella by PCR: NOT DETECTED
Shigella by PCR: NOT DETECTED

## 2015-05-22 DIAGNOSIS — K3184 Gastroparesis: Secondary | ICD-10-CM | POA: Insufficient documentation

## 2015-06-04 ENCOUNTER — Other Ambulatory Visit: Payer: Self-pay

## 2015-06-04 DIAGNOSIS — Z1231 Encounter for screening mammogram for malignant neoplasm of breast: Secondary | ICD-10-CM

## 2015-06-11 ENCOUNTER — Ambulatory Visit: Payer: Federal, State, Local not specified - PPO

## 2015-06-27 ENCOUNTER — Ambulatory Visit
Admission: RE | Admit: 2015-06-27 | Discharge: 2015-06-27 | Disposition: A | Discharge status: Home / Self Care | Payer: Federal, State, Local not specified - PPO | Source: Ambulatory Visit

## 2015-06-27 DIAGNOSIS — Z1231 Encounter for screening mammogram for malignant neoplasm of breast: Secondary | ICD-10-CM

## 2015-06-30 ENCOUNTER — Other Ambulatory Visit: Payer: Self-pay | Admitting: Neurology

## 2015-06-30 MED ORDER — AMPHETAMINE-DEXTROAMPHETAMINE 5 MG PO TABS: ORAL_TABLET | ORAL | Status: DC

## 2015-06-30 MED ORDER — AMPHETAMINE-DEXTROAMPHET ER 20 MG PO CP24: 20.0000 mg | ORAL_CAPSULE | Freq: Every day | ORAL | Status: DC

## 2015-06-30 NOTE — Telephone Encounter (Signed)
Pt called requesting refill for amphetamine-dextroamphetamine (ADDERALL) 5 MG tablet and amphetamine-dextroamphetamine (ADDERALL XR) 20 MG 24 hr capsule . Pt advised RX will be ready within 24 hours unless otherwise informed by RN

## 2015-07-01 ENCOUNTER — Telehealth: Payer: Self-pay

## 2015-07-01 NOTE — Telephone Encounter (Signed)
Called pt to inform her that her RX for addrerall is ready for pick up at the front desk.  Left a message asking her to call back.

## 2015-07-01 NOTE — Telephone Encounter (Signed)
Pt returned call. Pt notified rx was ready for pick up

## 2015-07-02 ENCOUNTER — Other Ambulatory Visit: Payer: Self-pay | Admitting: Pain Medicine

## 2015-07-02 ENCOUNTER — Encounter: Payer: Self-pay | Admitting: Pain Medicine

## 2015-07-02 ENCOUNTER — Ambulatory Visit: Payer: Federal, State, Local not specified - PPO | Attending: Pain Medicine | Admitting: Pain Medicine

## 2015-07-02 VITALS — BP 138/84 | HR 102 | Temp 98.5°F | Resp 18 | Ht 62.0 in | Wt 167.0 lb

## 2015-07-02 DIAGNOSIS — I10 Essential (primary) hypertension: Secondary | ICD-10-CM | POA: Diagnosis not present

## 2015-07-02 DIAGNOSIS — G8929 Other chronic pain: Secondary | ICD-10-CM | POA: Diagnosis not present

## 2015-07-02 DIAGNOSIS — Z79891 Long term (current) use of opiate analgesic: Secondary | ICD-10-CM | POA: Insufficient documentation

## 2015-07-02 DIAGNOSIS — M549 Dorsalgia, unspecified: Secondary | ICD-10-CM | POA: Diagnosis present

## 2015-07-02 DIAGNOSIS — M4316 Spondylolisthesis, lumbar region: Secondary | ICD-10-CM | POA: Insufficient documentation

## 2015-07-02 DIAGNOSIS — G43909 Migraine, unspecified, not intractable, without status migrainosus: Secondary | ICD-10-CM | POA: Insufficient documentation

## 2015-07-02 DIAGNOSIS — M47896 Other spondylosis, lumbar region: Secondary | ICD-10-CM

## 2015-07-02 DIAGNOSIS — M79604 Pain in right leg: Secondary | ICD-10-CM | POA: Diagnosis not present

## 2015-07-02 DIAGNOSIS — T50905S Adverse effect of unspecified drugs, medicaments and biological substances, sequela: Secondary | ICD-10-CM

## 2015-07-02 DIAGNOSIS — Z981 Arthrodesis status: Secondary | ICD-10-CM | POA: Diagnosis not present

## 2015-07-02 DIAGNOSIS — Z79899 Other long term (current) drug therapy: Secondary | ICD-10-CM | POA: Diagnosis not present

## 2015-07-02 DIAGNOSIS — M4310 Spondylolisthesis, site unspecified: Secondary | ICD-10-CM

## 2015-07-02 DIAGNOSIS — F1120 Opioid dependence, uncomplicated: Secondary | ICD-10-CM | POA: Diagnosis not present

## 2015-07-02 DIAGNOSIS — Z789 Other specified health status: Secondary | ICD-10-CM | POA: Insufficient documentation

## 2015-07-02 DIAGNOSIS — F319 Bipolar disorder, unspecified: Secondary | ICD-10-CM | POA: Insufficient documentation

## 2015-07-02 DIAGNOSIS — M5416 Radiculopathy, lumbar region: Secondary | ICD-10-CM

## 2015-07-02 DIAGNOSIS — G96198 Other disorders of meninges, not elsewhere classified: Secondary | ICD-10-CM | POA: Insufficient documentation

## 2015-07-02 DIAGNOSIS — Z5181 Encounter for therapeutic drug level monitoring: Secondary | ICD-10-CM | POA: Insufficient documentation

## 2015-07-02 DIAGNOSIS — G9619 Other disorders of meninges, not elsewhere classified: Secondary | ICD-10-CM

## 2015-07-02 DIAGNOSIS — T50905A Adverse effect of unspecified drugs, medicaments and biological substances, initial encounter: Secondary | ICD-10-CM

## 2015-07-02 DIAGNOSIS — M961 Postlaminectomy syndrome, not elsewhere classified: Secondary | ICD-10-CM

## 2015-07-02 DIAGNOSIS — M79605 Pain in left leg: Secondary | ICD-10-CM | POA: Diagnosis not present

## 2015-07-02 DIAGNOSIS — F209 Schizophrenia, unspecified: Secondary | ICD-10-CM | POA: Insufficient documentation

## 2015-07-02 DIAGNOSIS — M539 Dorsopathy, unspecified: Secondary | ICD-10-CM

## 2015-07-02 DIAGNOSIS — F1190 Opioid use, unspecified, uncomplicated: Secondary | ICD-10-CM

## 2015-07-02 DIAGNOSIS — M545 Low back pain, unspecified: Secondary | ICD-10-CM | POA: Insufficient documentation

## 2015-07-02 DIAGNOSIS — K219 Gastro-esophageal reflux disease without esophagitis: Secondary | ICD-10-CM | POA: Insufficient documentation

## 2015-07-02 DIAGNOSIS — M47816 Spondylosis without myelopathy or radiculopathy, lumbar region: Secondary | ICD-10-CM | POA: Insufficient documentation

## 2015-07-02 DIAGNOSIS — Z0289 Encounter for other administrative examinations: Secondary | ICD-10-CM

## 2015-07-02 DIAGNOSIS — M489 Spondylopathy, unspecified: Secondary | ICD-10-CM

## 2015-07-02 DIAGNOSIS — Q762 Congenital spondylolisthesis: Secondary | ICD-10-CM

## 2015-07-02 HISTORY — DX: Opioid use, unspecified, uncomplicated: F11.90

## 2015-07-02 MED ORDER — GABAPENTIN 300 MG PO CAPS: 300.0000 mg | ORAL_CAPSULE | Freq: Three times a day (TID) | ORAL | Status: DC

## 2015-07-02 MED ORDER — TIZANIDINE HCL 4 MG PO TABS: 4.0000 mg | ORAL_TABLET | Freq: Three times a day (TID) | ORAL | Status: DC | PRN

## 2015-07-02 MED ORDER — FENTANYL 75 MCG/HR TD PT72: 75.0000 ug | MEDICATED_PATCH | TRANSDERMAL | Status: DC

## 2015-07-02 MED ORDER — MELOXICAM 15 MG PO TABS: 15.0000 mg | ORAL_TABLET | Freq: Every day | ORAL | Status: DC

## 2015-07-02 NOTE — Progress Notes (Signed)
Patient's Name: [name] MRN: [id number] DOB: 21-Jul-1971 DOS: 07/02/2015  Primary Reason(s) for Visit: Encounter for evaluation before starting a Medication. CC: Back Pain; Joint Pain; and Leg Pain   HPI:   [name] is a 44 y.o. year old, female patient, who returns today as an established patient. She has Dyspnea; Narcolepsy with cataplexy; UARS (upper airway resistance syndrome); Chronic cholecystitis; Chronic lower back pain (L>R) (Primary Pain); Medication monitoring encounter; Nausea and vomiting; Nausea vomiting and diarrhea; Abdominal pain; Extrinsic asthma; Leukocytosis; Colitis, acute; Dental infection; Nausea with vomiting; Gastroparesis due to DM ([place]); Enteritis; Chronic pain; Opiate use; Opioid dependence ([place]); Long term prescription opiate use; Long term current use of opiate analgesic; Encounter for therapeutic drug level monitoring; Failed back surgical syndrome (x2); Gastric atony; Epidural fibrosis; Chronic lumbar radicular pain (Bilateral) (L>R); Bilateral lower extremity pain (L>R); Drug tolerance to opioids; Opiate analgesic contract exists; Lumbar spondylosis; Lumbar facet hypertrophy; Grade 1 Retrolisthesis of L1 over L2; Grade 1 Anterolisthesis of L5 over S1; and Lumbar facet arthropathy on her problem list.. Her primarily concern today is the Back Pain; Joint Pain; and Leg Pain    The patient comes into the clinic today for her follow-up visit to the initial evaluation. She returns after having completed the medical psychology evaluation with Dr. Jacqualine Code. She is currently taking Duragesic 75 mcg/h every 2 days, Zanaflex 4 mg 1 tablet by mouth 3 times a day, gabapentin 300 mg 1 tablet by mouth 3 times a day, and meloxicam 15 mg by mouth daily. Her case is significant for having a failed back surgery syndrome 2 with the first surgery having been done in 2009 by Dr. Lawana Chambers and the second one in 2011 by Dr. Rolena Infante. The second surgery was a lumbar fusion. In addition,  the patient had failed spinal cord stimulator trial by Dr. Nelva Bush. He also did multiple lumbar epidural steroid injections and apparently some lumbar facet blocks. None of them have provided the patient with long term benefits. She describes her pain as being primarily in the lower back with the left side being worse than the right. This pain is referred down to both lower extremities with the left being worse than the right. The pain travels down both legs to the bottom of her feet in what seems to be an S1 dermatomal distribution. The patient's last available MRI dated 05/23/2012 shows some moderate anterior disc bulging at the T10-11 level, retrolisthesis of L1 over L2, L2-3 mild left facet hypertrophy, L3-4 mild disc degeneration with moderate facet arthrosis, evidence of an L4-5 infraspinatus laminectomies with slight desiccated disc bulging, reduce perineural fat in the left lateral recess, probably due to facet hypertrophy. There is some mild foraminal stenosis at the L4-5 level. At the L5-S1 level there are findings of interbody arthrodeses with signs of combined at anterior and posterior approach to the fusion. There is mild enhancing asymmetric granulation tissue about the right S1 nerve root. There is also a slight anterolisthesis of L5 over S1. Unilateral right pedicle screws present within the L5 and S1. Anterior and color plate is visible with ankle or screw with an S1.  Today I talked to the patient about the her follow alternatives for interventional techniques as well as medication management options. Since the patient has already had lumbar epidural steroid injections and most of the conservative treatments, at this point I believe that she may benefit from a diagnostic caudal epidural steroid injection/epidurogram. Should this demonstrate the presence of epidural fibrosis, I  have offered her the possibility of a RACZ procedure. The patient was also informed about the possibility of intrathecal  pump trial. She understood and accepted and she indicated that she would be thinking about it. We will talk about this on her return visit.  Pharmacotherapy Review: Side-effects or Adverse reactions: None reported. Effectiveness: Described as relatively effective, allowing for increase in activities of daily living (ADL). Onset of action: Within expected pharmacological parameters. Duration of action: Within normal limits for medication. Peak effect: Timing and results are as within normal expected parameters. [place] PMP: Compliant with practice rules and regulations. DST: Compliant with practice rules and regulations. Lab work: No new labs ordered by our practice. Treatment compliance: Compliant. Substance Use Disorder (SUD) Risk Level: Low Planned course of action: Continue therapy as is.  Allergies: Ms. Quinonez is allergic to adhesive; aspirin; benadryl; codeine; dust mite extract; mold extract; pollen extract; prednisone; toradol; and zofran.  Meds: The patient has a current medication list which includes the following prescription(s): albuterol, amphetamine-dextroamphetamine, amphetamine-dextroamphetamine, budesonide-formoterol, clobetasol propionate, cyclosporine, dexlansoprazole, dicyclomine, fentanyl, fesoterodine, fluocinolone acetonide scalp, fluocinonide cream, gabapentin, hydrochlorothiazide, hypercare, lidocaine, lorazepam, loteprednol, lurasidone, meloxicam, metoclopramide, minivelle, nitroglycerin, olopatadine hcl, onabotulinumtoxina, pantoprazole, pramipexole, promethazine, relpax, saccharomyces boulardii, tizanidine, topiramate, triamcinolone cream, verapamil, and fentanyl. Requested Prescriptions   Signed Prescriptions Disp Refills  . tiZANidine (ZANAFLEX) 4 MG tablet 90 tablet 0    Sig: Take 1 tablet (4 mg total) by mouth every 8 (eight) hours as needed for muscle spasms.  . meloxicam (MOBIC) 15 MG tablet 30 tablet 0    Sig: Take 1 tablet (15 mg total) by mouth daily.  .  fentaNYL (DURAGESIC - DOSED MCG/HR) 75 MCG/HR 10 patch 0    Sig: Place 1 patch (75 mcg total) onto the skin every other day.  . gabapentin (NEURONTIN) 300 MG capsule 90 capsule 0    Sig: Take 1 capsule (300 mg total) by mouth 3 (three) times daily.  . fentaNYL (DURAGESIC) 75 MCG/HR 10 patch 0    Sig: Place 1 patch (75 mcg total) onto the skin every other day.    ROS: Constitutional: Afebrile, no chills, well hydrated and well nourished Gastrointestinal: negative Musculoskeletal:negative Neurological: negative Behavioral/Psych: negative  PFSH: Medical:  Ms. Clucas  has a past medical history of Hypertension; Overactive bladder; Esophageal reflux; Migraine; Psychosis; Schizophrenia, schizo-affective ([place]); OCD (obsessive compulsive disorder); Bipolar 1 disorder ([place]); Restless leg syndrome; S/P lumbar fusion (2011); Torn rotator cuff (it:2012,rt:2013); Migraines; Depression; Anxiety; Narcolepsy; Migraine; Hypersomnia; Asthma; Sleep apnea; Seasonal allergies; Schizophrenia ([place]); Osteoarthritis; Chronic lower back pain (02/07/2014); Medication monitoring encounter (02/07/2014); Gastroparesis; Allergy; and Opiate use (07/02/2015). Family: family history includes Cancer in her father, maternal grandmother, mother, paternal aunt, paternal aunt, and paternal grandmother; High Cholesterol in her father; Hypertension in her father; Thyroid disease in her father and sister. There is no history of Narcolepsy. Surgical:  has past surgical history that includes Vesicovaginal fistula closure w/ TAH (2001); Tubal ligation (2004); Shoulder surgery; Abdominal hysterectomy (2001); spurs,ruptured disc bone (2009); Lumbar fusion (2011); Rotator cuff repair (3546,5681); Oophorectomy ( 2004); Ankle Fusion; Cholecystectomy (N/A, 07/20/2013); Colonoscopy with propofol (N/A, 10/02/2013); Esophagogastroduodenoscopy (egd) with propofol (N/A, 10/02/2013); Wisdom tooth extraction; Laparoscopic salpingo oophorectomy (Left, 12/05/2013);  Flexible sigmoidoscopy (Left, 04/07/2015); Fracture surgery; and Spine surgery. Tobacco:  reports that she has never smoked. She has never used smokeless tobacco. Alcohol:  reports that she does not drink alcohol. Drug:  reports that she does not use illicit drugs.  Physical Exam: Vitals:  Today's Vitals  07/02/15 0949 07/02/15 0952  BP: 138/84   Pulse: 102   Temp: 98.5 F (36.9 C)   TempSrc: Oral   Resp: 18   Height: 5\' 2"  (1.575 m)   Weight: 167 lb (75.751 kg)   SpO2: 99%   PainSc: 8  8   Calculated BMI: Body mass index is 30.54 kg/(m^2). General appearance: alert, cooperative, appears stated age, no distress and mildly obese Eyes: conjunctivae/corneas clear. PERRL, EOM's intact. Fundi benign. Lungs: No evidence respiratory distress, no audible rales or ronchi and no use of accessory muscles of respiration Neck: no adenopathy, no carotid bruit, no JVD, supple, symmetrical, trachea midline and thyroid not enlarged, symmetric, no tenderness/mass/nodules Back: symmetric, no curvature. ROM normal. No CVA tenderness. Extremities: extremities normal, atraumatic, no cyanosis or edema Pulses: 2+ and symmetric Skin: Skin color, texture, turgor normal. No rashes or lesions Neurologic: Gait: Antalgic    Assessment: Encounter Diagnosis:  Primary Diagnosis: Chronic pain [G89.29]  Plan: [name] was seen today for back pain, joint pain and leg pain.  Diagnoses and all orders for this visit:  Chronic pain -     tiZANidine (ZANAFLEX) 4 MG tablet; Take 1 tablet (4 mg total) by mouth every 8 (eight) hours as needed for muscle spasms. -     meloxicam (MOBIC) 15 MG tablet; Take 1 tablet (15 mg total) by mouth daily. -     fentaNYL (DURAGESIC - DOSED MCG/HR) 75 MCG/HR; Place 1 patch (75 mcg total) onto the skin every other day. -     gabapentin (NEURONTIN) 300 MG capsule; Take 1 capsule (300 mg total) by mouth 3 (three) times daily. -     fentaNYL (DURAGESIC) 75 MCG/HR; Place 1 patch (75 mcg  total) onto the skin every other day.  Opiate use  Uncomplicated opioid dependence ([place])  Long term prescription opiate use -     Drugs of abuse screen w/o alc, rtn urine-sln; Future  Long term current use of opiate analgesic  Encounter for therapeutic drug level monitoring  Failed back surgical syndrome -     Nursing communication -     LUMBAR EPIDURAL STEROID INJECTION; Standing  Epidural fibrosis  Chronic lumbar radicular pain  Bilateral lower extremity pain (L>R)  Drug tolerance, sequela  Opiate analgesic contract exists  Lumbar facet hypertrophy  Grade 1 Retrolisthesis of L1 over L2  Grade 1 Anterolisthesis of L5 over S1  Lumbar facet arthropathy     Patient Instructions  Epidural Steroid Injection Patient Information  Description: The epidural space surrounds the nerves as they exit the spinal cord.  In some patients, the nerves can be compressed and inflamed by a bulging disc or a tight spinal canal (spinal stenosis).  By injecting steroids into the epidural space, we can bring irritated nerves into direct contact with a potentially helpful medication.  These steroids act directly on the irritated nerves and can reduce swelling and inflammation which often leads to decreased pain.  Epidural steroids may be injected anywhere along the spine and from the neck to the low back depending upon the location of your pain.   After numbing the skin with local anesthetic (like Novocaine), a small needle is passed into the epidural space slowly.  You may experience a sensation of pressure while this is being done.  The entire block usually last less than 10 minutes.  Conditions which may be treated by epidural steroids:   Low back and leg pain  Neck and arm pain  Spinal stenosis  Post-laminectomy syndrome  Herpes zoster (shingles) pain  Pain from compression fractures  Preparation for the injection:  1. Do not eat any solid food or dairy products within 6 hours  of your appointment.  2. You may drink clear liquids up to 2 hours before appointment.  Clear liquids include water, black coffee, juice or soda.  No milk or cream please. 3. You may take your regular medication, including pain medications, with a sip of water before your appointment  Diabetics should hold regular insulin (if taken separately) and take 1/2 normal NPH dos the morning of the procedure.  Carry some sugar containing items with you to your appointment. 4. A driver must accompany you and be prepared to drive you home after your procedure.  5. Bring all your current medications with your. 6. An IV may be inserted and sedation may be given at the discretion of the physician.   7. A blood pressure cuff, EKG and other monitors will often be applied during the procedure.  Some patients may need to have extra oxygen administered for a short period. 8. You will be asked to provide medical information, including your allergies, prior to the procedure.  We must know immediately if you are taking blood thinners (like Coumadin/Warfarin)  Or if you are allergic to IV iodine contrast (dye). We must know if you could possible be pregnant.  Possible side-effects:  Bleeding from needle site  Infection (rare, may require surgery)  Nerve injury (rare)  Numbness & tingling (temporary)  Difficulty urinating (rare, temporary)  Spinal headache ( a headache worse with upright posture)  Light -headedness (temporary)  Pain at injection site (several days)  Decreased blood pressure (temporary)  Weakness in arm/leg (temporary)  Pressure sensation in back/neck (temporary)  Call if you experience:  Fever/chills associated with headache or increased back/neck pain.  Headache worsened by an upright position.  New onset weakness or numbness of an extremity below the injection site  Hives or difficulty breathing (go to the emergency room)  Inflammation or drainage at the infection site  Severe  back/neck pain  Any new symptoms which are concerning to you  Please note:  Although the local anesthetic injected can often make your back or neck feel good for several hours after the injection, the pain will likely return.  It takes 3-7 days for steroids to work in the epidural space.  You may not notice any pain relief for at least that one week.  If effective, we will often do a series of three injections spaced 3-6 weeks apart to maximally decrease your pain.  After the initial series, we generally will wait several months before considering a repeat injection of the same type.  If you have any questions, please call [phone] [facility]     Medications discontinued today:  Medications Discontinued During This Encounter  Medication Reason  . tiZANidine (ZANAFLEX) 4 MG tablet Reorder  . meloxicam (MOBIC) 15 MG tablet Reorder  . fentaNYL (DURAGESIC - DOSED MCG/HR) 75 MCG/HR Reorder  . gabapentin (NEURONTIN) 300 MG capsule Reorder   Medications administered today:  Ms. Leedy had no medications administered during this visit.  Primary Care Physician: Donnie Coffin, MD Location: [facility] Note by: Kathlen Brunswick. Dossie Arbour, M.D, DABA, DABAPM, DABPM, DABIPP, FIPP

## 2015-07-02 NOTE — Patient Instructions (Addendum)
Epidural Steroid Injection Patient Information  Description: The epidural space surrounds the nerves as they exit the spinal cord.  In some patients, the nerves can be compressed and inflamed by a bulging disc or a tight spinal canal (spinal stenosis).  By injecting steroids into the epidural space, we can bring irritated nerves into direct contact with a potentially helpful medication.  These steroids act directly on the irritated nerves and can reduce swelling and inflammation which often leads to decreased pain.  Epidural steroids may be injected anywhere along the spine and from the neck to the low back depending upon the location of your pain.   After numbing the skin with local anesthetic (like Novocaine), a small needle is passed into the epidural space slowly.  You may experience a sensation of pressure while this is being done.  The entire block usually last less than 10 minutes.  Conditions which may be treated by epidural steroids:   Low back and leg pain  Neck and arm pain  Spinal stenosis  Post-laminectomy syndrome  Herpes zoster (shingles) pain  Pain from compression fractures  Preparation for the injection:  1. Do not eat any solid food or dairy products within 6 hours of your appointment.  2. You may drink clear liquids up to 2 hours before appointment.  Clear liquids include water, black coffee, juice or soda.  No milk or cream please. 3. You may take your regular medication, including pain medications, with a sip of water before your appointment  Diabetics should hold regular insulin (if taken separately) and take 1/2 normal NPH dos the morning of the procedure.  Carry some sugar containing items with you to your appointment. 4. A driver must accompany you and be prepared to drive you home after your procedure.  5. Bring all your current medications with your. 6. An IV may be inserted and sedation may be given at the discretion of the physician.   7. A blood pressure  cuff, EKG and other monitors will often be applied during the procedure.  Some patients may need to have extra oxygen administered for a short period. 8. You will be asked to provide medical information, including your allergies, prior to the procedure.  We must know immediately if you are taking blood thinners (like Coumadin/Warfarin)  Or if you are allergic to IV iodine contrast (dye). We must know if you could possible be pregnant.  Possible side-effects:  Bleeding from needle site  Infection (rare, may require surgery)  Nerve injury (rare)  Numbness & tingling (temporary)  Difficulty urinating (rare, temporary)  Spinal headache ( a headache worse with upright posture)  Light -headedness (temporary)  Pain at injection site (several days)  Decreased blood pressure (temporary)  Weakness in arm/leg (temporary)  Pressure sensation in back/neck (temporary)  Call if you experience:  Fever/chills associated with headache or increased back/neck pain.  Headache worsened by an upright position.  New onset weakness or numbness of an extremity below the injection site  Hives or difficulty breathing (go to the emergency room)  Inflammation or drainage at the infection site  Severe back/neck pain  Any new symptoms which are concerning to you  Please note:  Although the local anesthetic injected can often make your back or neck feel good for several hours after the injection, the pain will likely return.  It takes 3-7 days for steroids to work in the epidural space.  You may not notice any pain relief for at least that one week.    If effective, we will often do a series of three injections spaced 3-6 weeks apart to maximally decrease your pain.  After the initial series, we generally will wait several months before considering a repeat injection of the same type.  If you have any questions, please call [phone] [facility] 

## 2015-07-03 ENCOUNTER — Other Ambulatory Visit: Payer: Self-pay | Admitting: Pain Medicine

## 2015-07-03 ENCOUNTER — Encounter: Payer: Self-pay | Admitting: Pain Medicine

## 2015-07-11 LAB — TOXASSURE SELECT 13 (MW), URINE: PDF: 0

## 2015-07-24 ENCOUNTER — Other Ambulatory Visit: Payer: Self-pay | Admitting: Pain Medicine

## 2015-08-05 ENCOUNTER — Encounter: Payer: Federal, State, Local not specified - PPO | Admitting: Pain Medicine

## 2015-08-18 ENCOUNTER — Encounter: Payer: Federal, State, Local not specified - PPO | Admitting: Pain Medicine

## 2015-08-28 ENCOUNTER — Encounter: Payer: Self-pay | Admitting: Neurology

## 2015-08-28 ENCOUNTER — Ambulatory Visit (INDEPENDENT_AMBULATORY_CARE_PROVIDER_SITE_OTHER): Payer: Federal, State, Local not specified - PPO | Admitting: Neurology

## 2015-08-28 VITALS — BP 110/74 | HR 80 | Resp 16 | Ht 62.0 in | Wt 161.0 lb

## 2015-08-28 DIAGNOSIS — G47419 Narcolepsy without cataplexy: Secondary | ICD-10-CM | POA: Diagnosis not present

## 2015-08-28 DIAGNOSIS — D508 Other iron deficiency anemias: Secondary | ICD-10-CM | POA: Diagnosis not present

## 2015-08-28 DIAGNOSIS — K3189 Other diseases of stomach and duodenum: Secondary | ICD-10-CM

## 2015-08-28 DIAGNOSIS — K3184 Gastroparesis: Secondary | ICD-10-CM

## 2015-08-28 HISTORY — DX: Other iron deficiency anemias: D50.8

## 2015-08-28 MED ORDER — AMPHETAMINE-DEXTROAMPHET ER 20 MG PO CP24: 20.0000 mg | ORAL_CAPSULE | Freq: Every day | ORAL | Status: DC

## 2015-08-28 MED ORDER — AMPHETAMINE-DEXTROAMPHETAMINE 5 MG PO TABS: ORAL_TABLET | ORAL | Status: DC

## 2015-08-28 NOTE — Patient Instructions (Signed)
Sodium Oxybate oral solution What is this medicine? SODIUM OXYBATE (SOE dee um OX i bate) is used to treat excessive sleepiness and cataplexy in patients with narcolepsy. Cataplexy causes a sudden muscle weakness due to a strong emotional response. This medicine is not available in retail pharmacies. Your doctor will enroll you in a program that will provide the drug to you. This medicine may be used for other purposes; ask your health care provider or pharmacist if you have questions. What should I tell my health care provider before I take this medicine? They need to know if you have any of these conditions: -depression, psychosis, or other mood disorders -heart disease or high blood pressure -if you are on a salt-restricted diet -if you frequently drink alcohol containing beverages -if you have a history of drug or alcohol abuse -kidney disease -liver disease -lung or breathing disease, like sleep apnea -seizures -succinic semialdehyde dehydrogenase deficiency -suicidal thoughts, plans, or attempt; a previous suicide attempt by you or a family member -an unusual or allergic reaction to sodium oxybate, other medicines, foods, dyes, or preservatives -pregnant or trying to get pregnant -breast-feeding How should I use this medicine? Take this medicine by mouth. Follow the directions on the prescription label. Take this medicine on an empty stomach, or at least 2 hours after food. Do not take with food. Do not take your medicine more often than directed. A special MedGuide will be given to you by the pharmacist with each prescription and refill. Be sure to read this information carefully each time. Talk to your pediatrician regarding the use of this medicine in children. Special care may be needed. Overdosage: If you think you have taken too much of this medicine contact a poison control center or emergency room at once. NOTE: This medicine is only for you. Do not share this medicine with  others. What if I miss a dose? Skip the missed dose. If it is almost time for your next dose, take only that dose. Allow at least two and one-half hours between each nightly dose. Do not take double or extra doses. What may interact with this medicine? Do not take this medicine with any of the following medications: -alcohol -barbiturates, like phenobarbital -medicines used for sedation or to help you sleep This medicine may also interact with the following medications: -bupropion -divalproex sodium -dronabinol -marijuana -medicines for depression, anxiety, or psychotic disturbances -muscle relaxants -other stimulants, although these are commonly used with sodium oxybate -prescription pain medicines, including tramadol -valproate or valproic acid This list may not describe all possible interactions. Give your health care provider a list of all the medicines, herbs, non-prescription drugs, or dietary supplements you use. Also tell them if you smoke, drink alcohol, or use illegal drugs. Some items may interact with your medicine. What should I watch for while using this medicine? The use of this medicine requires careful supervision. Visit your doctor or health care professional for regular checks on your progress. Do not suddenly stop taking this medicine if you have been taking it for a long time. Withdrawal symptoms may occur. Your doctor or health care professional may need to slowly stop your doses. This medicine may affect your concentration or function. Let your doctor or health care professional know if you have increased sleepiness or confusion during the day. This medicine causes sleep very quickly. You should only take your first dose at bedtime, while in bed. The second dose should be taken 2.5 to 4 hours after your first dose.  Do not drive a car, operate heavy machinery or perform any activities that require mental alertness for at least 6 hours after taking this drug. Use extreme  care in any such daily activities until you know how this medicine affects you. Because alcohol may interfere with this medicine and may cause serious side effects, you must avoid alcohol-containing beverages while on this medicine. Do not take this medicine along with sleep medicines or other drugs with strong sedative effects, serious side effects may occur. This medicine can be dangerous in overdose. If you take more than prescribed or take it by accident, get emergency medical help right away. What side effects may I notice from receiving this medicine? Side effects that you should report to your doctor or health care professional as soon as possible: -allergic reactions like skin rash, itching or hives, swelling of the face, lips, or tongue -breathing problems -changes in alertness -confusion -fast, irregular heartbeat -hallucination, loss of contact with reality -increased blood pressure, particularly if you already have high blood pressure -memory loss -seizures -sleepwalking -tremors or shaking movements -unusual changes in emotions or moods -urinary incontinence Side effects that usually do not require medical attention (report to your doctor or health care professional if they continue or are bothersome): -diarrhea -dizziness -drowsiness -headache -increased urination -nausea, vomiting or stomach upset -unusual dreams This list may not describe all possible side effects. Call your doctor for medical advice about side effects. You may report side effects to FDA at 1-800-FDA-1088. Where should I keep my medicine? Keep out of reach of children. This medicine may cause accidental overdose and death if it is taken by other adults, children, or pets. This medicine can be abused. Keep your medicine in a safe place to protect it from theft. Do not share this medicine with anyone. Selling or giving away this medicine is dangerous and against the law. Store at room temperature between 15  and 30 degrees C (59 and 86 degrees F).Keep this medicine in the original container. Throw away any unused medicine after the expiration date. Dispose of properly. Empty any unused medicine down the sink drain, then cross out the label on the medicine bottle with a marker and place the empty bottle in the trash. NOTE: This sheet is a summary. It may not cover all possible information. If you have questions about this medicine, talk to your doctor, pharmacist, or health care provider.    2016, [employer]. (2014-04-03 15:13:37) Narcolepsy Narcolepsy is a nervous system disorder that causes daytime sleepiness and sudden bouts of irresistible sleep during the day (sleep attacks). Many people with narcolepsy live with extreme daytime sleepiness for many years before being diagnosed and treated. Narcolepsy is a lifelong (chronic) disorder.  You normally go through cycles when you sleep. When your sleep becomes deeper, you have less body movement, and you start dreaming. This type of deep sleep should happen after about 90 minutes of lighter sleep. Deep sleep is called rapid eye movement (REM) sleep. When you have narcolepsy, REM is not well regulated. It can happen as soon as you fall asleep, and components of REM sleep can occur during the day when you are awake. Uncontrolled REM sleep causes symptoms of narcolepsy. CAUSES Narcolepsy may be caused by an abnormality with a chemical messenger (neurotransmitter) in your brain that controls your sleep and wake cycles. Most people with narcolepsy have low levels of the neurotransmitter hypocretin. Hypocretin is important for controlling wakefulness.  A hypocretin imbalance may be caused by abnormal  genes that are passed down through families. It may also develop if the body's defense system (immune system) mistakenly attacks the brain cells that produce hypocretin (autoimmune disease). RISK FACTORS You may be at higher risk for narcolepsy if you have  a family history of the disease. Other risk factors that may contribute include:  Injuries, tumors, or infections in the areas of the brain that control sleep.  Exposure to toxins.  Stress.  Hormones produced during puberty and menopause.  Poor sleep habits. SIGNS AND SYMPTOMS  Symptoms of narcolepsy can start at any age but usually begin when people are between the ages of 28 and 14 years. There are four major symptoms. Not everyone with narcolepsy will have all four.   Excessive daytime sleepiness. This is the most common symptom and is usually the first symptom you will notice.  You may feel as if you are in a mental fog.  Daytime sleepiness may severely affect your performance at work or school.  You may fall asleep during a conversation or while eating dinner.  Sudden loss of muscle tone (cataplexy). You do not lose consciousness, but you may suddenly lose muscle control. When this occurs, your speech may become slurred, or your knees may buckle. This symptom is usually triggered by surprise, anger, or laughter.  Sleep paralysis. You may lose the ability to speak or move just as you start to fall asleep or wake up. You will be aware of the paralysis. It usually lasts for just a few seconds or minutes.  Vivid hallucinations. These may occur with sleep paralysis. The hallucinations are like having bizarre or frightening dreams while you are still awake. Other symptoms may include:   Trouble staying asleep at night (insomnia).  Restless sleep.  Feeling a strong urge to get up at night to smoke or eat. DIAGNOSIS  Your health care provider can diagnose narcolepsy based on your symptoms and the results of two diagnostic tests. You may also be asked to keep a sleep diary for several weeks. You may need the tests if you have had daytime sleepiness for at least 3 months. The two tests are:   A polysomnogram to find out how well your REM sleep is regulated at night. This test is an  overnight sleep study. It measures your heart rate, breathing, movement, and brain waves.  A multiple sleep latency test (MSLT) to find out how well your REM sleep is regulated during the day. This is a daytime sleep study. You may need to take several naps during the day. This test also measures your heart rate, breathing, movement, and brain waves. TREATMENT  There is no cure for narcolepsy, but treatment can be very effective in helping manage the condition. Treatment may include:  Lifestyle and sleeping strategies to help cope with the condition.  Medicines. These may include:  Stimulant medicines to fight daytime sleepiness.  Antidepressant medicines to treat cataplexy.  Sodium oxybate. This is a strong sedative that you take at night. It can help daytime sleepiness and cataplexy. HOME CARE INSTRUCTIONS  Take all medicines as directed by your health care provider.  Follow these sleep practices:  Try to get about 8 hours of sleep every night.  Go to sleep and get up close to the same time every day.  Keep your bedroom dark, quiet, and comfortable.  Schedule short naps for when you feel sleepiest during the day. Tell your employer or teachers that you have narcolepsy. You may be able to adjust your  schedule to include time for naps.  Try to get at least 20 minutes of exercise every day. This will help you sleep better at night and reduce daytime sleepiness.  Do not drink alcohol or caffeinated beverages within 4-5 hours of bedtime.  Do not eat a heavy meal before bedtime. Eat at about the same times every day.  Do not smoke.  Do not drive if you are sleepy or have untreated narcolepsy.  Do not swim or go out on the water without a life jacket. SEEK MEDICAL CARE IF: Your medicines are not controlling your narcolepsy symptoms. SEEK IMMEDIATE MEDICAL CARE IF:  You hurt yourself during a sleep attack or an attack of cataplexy.  You have chest pain or trouble breathing.    This information is not intended to replace advice given to you by your health care provider. Make sure you discuss any questions you have with your health care provider.   Document Released: 08/13/2002 Document Revised: 09/13/2014 Document Reviewed: 08/15/2013 [employer] Interactive Patient Education [employer].

## 2015-08-28 NOTE — Progress Notes (Signed)
PATIENT: [name] DOB: 04-24-1971  REASON FOR VISIT: follow up- Narolepsy HISTORY FROM: patient  HISTORY OF PRESENT ILLNESS: [name] is a 44 year old  AA female, right handed and  with a history of narcolepsy.   She returns on 08-28-15 for follow-up. She is currently taking Adderall XR 20 mg up to  capsule daily. She reports that this works well initially. However by midday tends to wear off. She states that she takes her medication at 7:30 AM and by lunchtime she is starting to feel tired again. Patient states she goes to bed around 9:30 PM. She states that it typically takes her a while to fall asleep. She also has trouble staying asleep.  The patient is on CPAP however there is been no download since 2014. She did not bring her CPAP, again.  I have asked the patient to bring in her machine and card for [place] to do a download. Her Epworth sleepiness score is 14 was previously 18 and fatigue severity score is 62 was previously 63. She returns for refills.  She will implement power naps of 15 - 30 minutes at midday. She can do this since she works from home. CD   HISTORY 08/07/14: [name] is a 44 year old female with a history of narcolepsy. She returns today for follow-up. She was prescribed Adderall 20 mg BID. She was taken off Xyrem because she was placed on fentanyl patches for pain control. She reports that her daytime sleepiness has not improved with Adderall. She has been only taking 1 Adderall 20 mg around mid day ( she thought that was how it was ordered). She states that the Adderall helps for several hours after taking it but then it wears off. She states that she is spending more time in the bed rather than up and doing things. She is also not getting restorative sleep which she feels is affecting her fatigue level. HerEpworth was 18 points, the  Fatigue severity score was 63.   HISTORY 02/07/14 [facility]): [name] underwent a gallbladder surgery as of November  2014under Dr. Rush Farmer after losing 20 pounds with constant nausea, indigestion, abdominal cramping. Meanwhile there was another exploratory surgery April 12th 2015, she  was diagnosed with a cyst on the ovary/ fallopian tube.  The ovary,her last remaining was removed. She has gastroparesis. Dr. Michail Sermon placed her on Reglan and Linzette.  She was referred to [facility] for September 2015 She was placed on Fentanyl patches for pain control. She started these after she started [facility] and now reports taking the fentanyl every 2 days instead of every 3 days. This is contraindicated for XYREM users. [name] is here for her regular 6 month revisit for her Xyrem therapy .She has responded very well she is much less excessive daytime sleepy she has much less frequent narcoleptic and cataplectic attacks. As I have described in my past visit note , her MSLT documented a pathological degree of hypersomnia but was lacking the REM onset naps, which would diagnose narcolepsy. 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 Her response to Xyrem also was extremely preferable.After her last visit I had wanted the patient to be tested by the HDL - narcolepsy factorEpworth 15 on meds, 20 without XYREM and FSS 63 points. Based on her narcotic pain therapy needs, I doubt that her pain management physician sees any alternative to the current regimen.I will not refill her Xyrem she takes 9 g daily  divided into doses at night. I will change her to Adderall 20 mg bid po. I will recheck her liver function tests. She was just discharged from the hospital; 6 weeks ago - RV in 6 month with NP Hassell Done for adderall refill, check up- Epworth and FSS, weight , and LFT. cc [facility] .  REVIEW OF SYSTEMS: Out of a complete 14 system review of symptoms, the patient complains only of the following symptoms, and all other reviewed systems are negative.  Epworth and FSS elevated on Adderall.    ALLERGIES: Allergies  Allergen Reactions  . Adhesive [Tape] Rash    Glue from steri strips  . Aspirin Nausea Only  . Benadryl [Diphenhydramine Hcl] Other (See Comments)    Makes her hyper, jittery  . Codeine Nausea And Vomiting  . Dust Mite Extract Other (See Comments)    Cough, sneeze, eyes runny  . Mold Extract [Trichophyton] Other (See Comments)    Cough, sneeze, eyes/nose runny  . Pollen Extract Other (See Comments)    Cough, sneeze, eyes/nose runny  . Prednisone Other (See Comments)    Makes her hyper, jittery   . Toradol [Ketorolac Tromethamine] Anxiety  . Zofran [Ondansetron] Nausea And Vomiting    HOME MEDICATIONS: Outpatient Prescriptions Prior to Visit  Medication Sig Dispense Refill  . amphetamine-dextroamphetamine (ADDERALL XR) 20 MG 24 hr capsule Take 1 capsule (20 mg total) by mouth daily. 30 capsule 0  . amphetamine-dextroamphetamine (ADDERALL) 5 MG tablet Take 1 tablet daily at 1PM. 30 tablet 0  . budesonide-formoterol (SYMBICORT) 160-4.5 MCG/ACT inhaler Inhale 2 puffs into the lungs 2 (two) times daily.      . Clobetasol Propionate (CLOBEX) 0.05 % shampoo Apply 1 application topically every Wednesday.     . cycloSPORINE (RESTASIS) 0.05 % ophthalmic emulsion Place 1 drop into both eyes every 12 (twelve) hours.      Marland Kitchen dicyclomine (BENTYL) 10 MG capsule Take 10 mg by mouth 4 (four) times daily as needed for spasms.    . fentaNYL (DURAGESIC) 75 MCG/HR Place 1 patch (75 mcg total) onto the skin every other day. 10 patch 0  . fesoterodine (TOVIAZ) 8 MG TB24 tablet Take 8 mg by mouth daily.    Marland Kitchen FLUOCINOLONE ACETONIDE SCALP 0.01 % OIL Apply 1 application topically daily.  0  . fluocinonide (LIDEX) 0.05 % cream Apply 1 application topically 3 (three) times a week.     . gabapentin (NEURONTIN) 300 MG capsule Take 1 capsule (300 mg total) by mouth 3 (three) times daily. 90 capsule 0  . hydrochlorothiazide (HYDRODIURIL) 25 MG tablet Take 25 mg by mouth every morning.      Marland Kitchen HYPERCARE 20 % external solution Apply 1 application topically daily.     Marland Kitchen lidocaine (XYLOCAINE) 2 % jelly 1 application by Other route as needed (rectally).    . LORazepam (ATIVAN) 0.5 MG tablet Take 0.5 mg by mouth every 6 (six) hours as needed for anxiety.    Marland Kitchen loteprednol (LOTEMAX) 0.2 % SUSP Place 1 drop into both eyes daily.    Marland Kitchen lurasidone (LATUDA) 40 MG TABS Take 40 mg by mouth at bedtime. Reported on 08/28/2015    . metoCLOPramide (REGLAN) 5 MG tablet Take 1 tablet (5 mg total) by mouth every 6 (six) hours as needed for nausea. 30 tablet 0  . MINIVELLE 0.1 MG/24HR patch Apply 1 patch topically 2 (two) times a week.  12  . nitroGLYCERIN (NITROGLYN) 2 % ointment Apply 1 inch topically at bedtime.    Marland Kitchen  Olopatadine HCl (PATADAY) 0.2 % SOLN Place 1 drop into both eyes 2 (two) times daily as needed (for allergies).     . OnabotulinumtoxinA (BOTOX IJ) Inject as directed every 3 (three) months.    . pantoprazole (PROTONIX) 40 MG tablet Take 1 tablet (40 mg total) by mouth daily. (Patient taking differently: Take 40 mg by mouth 2 (two) times daily. ) 30 tablet 0  . pramipexole (MIRAPEX) 1 MG tablet Take 1 mg by mouth at bedtime.  0  . promethazine (PHENERGAN) 25 MG tablet Take 50 mg by mouth every 6 (six) hours as needed for nausea or vomiting.   3  . RELPAX 40 MG tablet Take 40 mg by mouth every 2 (two) hours as needed for migraine or headache.     . saccharomyces boulardii (FLORASTOR) 250 MG capsule Take 1 capsule (250 mg total) by mouth 2 (two) times daily. 60 capsule 1  . tiZANidine (ZANAFLEX) 4 MG tablet Take 1 tablet (4 mg total) by mouth every 8 (eight) hours as needed for muscle spasms. 90 tablet 0  . topiramate (TOPAMAX) 100 MG tablet Take 100-200 mg by mouth 2 (two) times daily. 1 tab every AM and 2 at night    . triamcinolone cream (KENALOG) 0.1 % Apply 1 application topically daily as needed (to affected area).     . verapamil (VERELAN PM) 240 MG 24 hr capsule Take 240 mg by mouth  at bedtime.      Marland Kitchen albuterol (PROVENTIL HFA;VENTOLIN HFA) 108 (90 BASE) MCG/ACT inhaler Inhale 2 puffs into the lungs every 6 (six) hours as needed for wheezing or shortness of breath.    . dexlansoprazole (DEXILANT) 60 MG capsule Take 60 mg by mouth daily. Reported on 08/28/2015    . meloxicam (MOBIC) 15 MG tablet Take 1 tablet (15 mg total) by mouth daily. (Patient not taking: Reported on 08/28/2015) 30 tablet 0  . fentaNYL (DURAGESIC - DOSED MCG/HR) 75 MCG/HR Place 1 patch (75 mcg total) onto the skin every other day. (Patient not taking: Reported on 08/28/2015) 10 patch 0   No facility-administered medications prior to visit.    PAST MEDICAL HISTORY: Past Medical History  Diagnosis Date  . Hypertension   . Overactive bladder   . Esophageal reflux   . Migraine   . Psychosis   . Schizophrenia, schizo-affective ([place])   . OCD (obsessive compulsive disorder)   . Bipolar 1 disorder ([place])   . Restless leg syndrome   . S/P lumbar fusion 2011  . Torn rotator cuff it:2012,rt:2013  . Migraines   . Depression     excessive daytime sleepiness-MSLT confirmed pathological degree of hypersomnia-12/13  . Anxiety   . Narcolepsy   . Migraine   . Hypersomnia   . Asthma   . Sleep apnea     uses CPAP every night  . Seasonal allergies   . Schizophrenia ([place])   . Osteoarthritis     knees, hands, shoulder, lower back  . Chronic lower back pain 02/07/2014  . Medication monitoring encounter 02/07/2014  . Gastroparesis   . Allergy   . Opiate use 07/02/2015    PAST SURGICAL HISTORY: Past Surgical History  Procedure Laterality Date  . Vesicovaginal fistula closure w/ tah  2001  . Tubal ligation  2004  . Shoulder surgery      left and right , bone spurs, torn muscle  . Abdominal hysterectomy  2001  . Spurs,ruptured disc bone  2009  . Lumbar fusion  2011  L4-L5  . Rotator cuff repair  2012,2013    torn   . Oophorectomy   2004    right  . Ankle fusion      right  . Cholecystectomy N/A  07/20/2013    Procedure: LAPAROSCOPIC CHOLECYSTECTOMY;  Surgeon: Harl Bowie, MD;  Location: [facility];  Service: General;  Laterality: N/A;  . Colonoscopy with propofol N/A 10/02/2013    Procedure: COLONOSCOPY WITH PROPOFOL;  Surgeon: Garlan Fair, MD;  Location: [facility];  Service: Endoscopy;  Laterality: N/A;  . Esophagogastroduodenoscopy (egd) with propofol N/A 10/02/2013    Procedure: ESOPHAGOGASTRODUODENOSCOPY (EGD) WITH PROPOFOL;  Surgeon: Garlan Fair, MD;  Location: [facility];  Service: Endoscopy;  Laterality: N/A;  . Wisdom tooth extraction    . Laparoscopic salpingo oopherectomy Left 12/05/2013    Procedure: LAPAROSCOPIC SALPINGO OOPHORECTOMY;  Surgeon: Thurnell Lose, MD;  Location: [facility];  Service: Gynecology;  Laterality: Left;  . Flexible sigmoidoscopy Left 04/07/2015    Procedure: FLEXIBLE SIGMOIDOSCOPY;  Surgeon: Ronald Lobo, MD;  Location: [facility]COPY;  Service: Endoscopy;  Laterality: Left;  . Fracture surgery    . Spine surgery      FAMILY HISTORY: Family History  Problem Relation Age of Onset  . Cancer Mother     breast  . Cancer Father     prostate,colon  . Hypertension Father   . Thyroid disease Father   . High Cholesterol Father   . Thyroid disease Sister   . Cancer Paternal Aunt     colon  . Cancer Maternal Grandmother     kidney  . Cancer Paternal Grandmother     colon  . Cancer Paternal Aunt     lung  . Narcolepsy Neg Hx        PHYSICAL EXAM  Filed Vitals:   08/28/15 1520  BP: 110/74  Pulse: 80  Resp: 16  Height: 5\' 2"  (1.575 m)  Weight: 161 lb (73.029 kg)   Body mass index is 29.44 kg/(m^2).  Generalized: Well developed, in no acute distress   Neurological examination  Mentation: Alert oriented to time, place, history taking. Follows all commands speech and language fluent Cranial nerve II-XII: Pupils were equal round reactive to light. Extraocular movements were full, visual field were full on confrontational test.  Facial sensation and strength were normal. Uvula tongue midline. Head turning and shoulder shrug  were normal and symmetric.   DIAGNOSTIC DATA (LABS, IMAGING, TESTING) - I reviewed patient records, labs, notes, testing and imaging myself where available.  Lab Results  Component Value Date   WBC 5.3 04/09/2015   HGB 10.9* 04/09/2015   HCT 30.5* 04/09/2015   MCV 79.0 04/09/2015   PLT 216 04/09/2015      Component Value Date/Time   NA 140 04/10/2015 0523   NA 142 08/07/2014 0922   K 3.7 04/10/2015 0523   CL 109 04/10/2015 0523   CO2 25 04/10/2015 0523   GLUCOSE 101* 04/10/2015 0523   GLUCOSE 90 08/07/2014 0922   BUN <5* 04/10/2015 0523   BUN 15 08/07/2014 0922   CREATININE 0.79 04/10/2015 0523   CALCIUM 7.9* 04/10/2015 0523   PROT 6.1* 04/04/2015 0644   PROT 7.7 08/07/2014 0922   ALBUMIN 3.3* 04/04/2015 0644   ALBUMIN 4.8 08/07/2014 0922   AST 14* 04/04/2015 0644   ALT 11* 04/04/2015 0644   ALKPHOS 45 04/04/2015 0644   BILITOT 1.2 04/04/2015 0644   GFRNONAA >60 04/10/2015 0523   GFRAA >60 04/10/2015 [id number]  ASSESSMENT AND PLAN 44 y.o. year old female  has a past medical history of Hypertension; Overactive bladder; Esophageal reflux; Migraine; Psychosis; Schizophrenia, schizo-affective ([place]); OCD (obsessive compulsive disorder); Bipolar 1 disorder ([place]); Restless leg syndrome; S/P lumbar fusion (2011); Torn rotator cuff (it:2012,rt:2013); Migraines; Depression; Anxiety; Narcolepsy; Migraine; Hypersomnia; Asthma; Sleep apnea; Seasonal allergies; Schizophrenia ([place]); Osteoarthritis; Chronic lower back pain (02/07/2014); Medication monitoring encounter (02/07/2014); Gastroparesis; Allergy; and Opiate use (07/02/2015). here with:  1. Narcolepsy, 25 minutes with over 50% of the face to face time dedicated to narcolepsy care, daily routines.  Schedule Power naps as possible, taking Adderall 2 a day.   Continue Adderall XR 20 mg daily.  Add Adderall IR 5 mg daily at 1 PM to help midday  sleepiness.  Advised to let us know if symptoms worsen.  F/U in 6 months with NP, all visits include labs, follow up on anemia,  ? Malabsorption ? possible overlap with gastroparesis.   Lyfe Monger, MD   08/28/2015, 3:28 PM   [facility] [address], [place], [place] [address] [phone]  Cc Dr Donnie Coffin. MD

## 2015-08-29 LAB — CBC WITH DIFFERENTIAL/PLATELET
BASOS: 0 %
Basophils Absolute: 0 10*3/uL (ref 0.0–0.2)
EOS (ABSOLUTE): 0.1 10*3/uL (ref 0.0–0.4)
EOS: 1 %
HEMATOCRIT: 37.3 % (ref 34.0–46.6)
Hemoglobin: 12.7 g/dL (ref 11.1–15.9)
IMMATURE GRANS (ABS): 0 10*3/uL (ref 0.0–0.1)
IMMATURE GRANULOCYTES: 0 %
LYMPHS: 48 %
Lymphocytes Absolute: 2.8 10*3/uL (ref 0.7–3.1)
MCH: 27.1 pg (ref 26.6–33.0)
MCHC: 34 g/dL (ref 31.5–35.7)
MCV: 80 fL (ref 79–97)
MONOCYTES: 4 %
Monocytes Absolute: 0.2 10*3/uL (ref 0.1–0.9)
NEUTROS PCT: 47 %
Neutrophils Absolute: 2.7 10*3/uL (ref 1.4–7.0)
Platelets: 235 10*3/uL (ref 150–379)
RBC: 4.69 x10E6/uL (ref 3.77–5.28)
RDW: 14.3 % (ref 12.3–15.4)
WBC: 5.7 10*3/uL (ref 3.4–10.8)

## 2015-08-29 LAB — COMPREHENSIVE METABOLIC PANEL
ALT: 9 IU/L (ref 0–32)
AST: 18 IU/L (ref 0–40)
Albumin/Globulin Ratio: 1.5 (ref 1.1–2.5)
Albumin: 4.3 g/dL (ref 3.5–5.5)
Alkaline Phosphatase: 57 IU/L (ref 39–117)
BUN/Creatinine Ratio: 9 (ref 9–23)
BUN: 7 mg/dL (ref 6–24)
Bilirubin Total: 0.2 mg/dL (ref 0.0–1.2)
CALCIUM: 9.5 mg/dL (ref 8.7–10.2)
CO2: 27 mmol/L (ref 18–29)
CREATININE: 0.74 mg/dL (ref 0.57–1.00)
Chloride: 101 mmol/L (ref 96–106)
GFR calc Af Amer: 112 mL/min/{1.73_m2} (ref >59)
GFR, EST NON AFRICAN AMERICAN: 97 mL/min/{1.73_m2} (ref >59)
Globulin, Total: 2.8 g/dL (ref 1.5–4.5)
Glucose: 83 mg/dL (ref 65–99)
Potassium: 4.1 mmol/L (ref 3.5–5.2)
Sodium: 143 mmol/L (ref 134–144)
Total Protein: 7.1 g/dL (ref 6.0–8.5)

## 2015-09-02 ENCOUNTER — Telehealth: Payer: Self-pay

## 2015-09-02 NOTE — Telephone Encounter (Signed)
-----   Message from Larey Seat, MD sent at 08/29/2015 10:30 AM EST ----- Dear [name] your metabolic blood tests as well as you're blood cell counts were all in normal limits. Normal calcium, glucose, kidney and liver function.  This is an excellent result,  I hope you have a very joyful  Christmas

## 2015-09-02 NOTE — Telephone Encounter (Signed)
Called pt to discuss lab results. All were normal. Left a message asking pt to call back.

## 2015-09-03 NOTE — Telephone Encounter (Signed)
-----   Message from Larey Seat, MD sent at 08/29/2015 10:30 AM EST ----- Dear Mrs. Stamper your metabolic blood tests as well as you're blood cell counts were all in normal limits. Normal calcium, glucose, kidney and liver function.  This is an excellent result,  I hope you have a very joyful  Christmas

## 2015-09-03 NOTE — Telephone Encounter (Signed)
Spoke to pt regarding her lab results and advised her that they are normal. Pt verbalized understanding.  Pt is saying that her pharmacy let her know that she needed an PA for her adderall. Pt is questioning why, because there was a PA done that approved adderall through next year? I advised pt that I would ask our pharmacy tech to look into it. Pt verbalized understanding.

## 2015-09-03 NOTE — Telephone Encounter (Signed)
I contacted ins.  They have updated their formulary, and require that we redo prior auth.  I provided clinical info.  Request is under review Ref # [id number]  Ins has approved the request for continuation of coverage on Adderall effective until 09/02/2016.  They indicate they have notified patient of this decision as well.

## 2015-09-11 NOTE — Progress Notes (Signed)
PT did not pick up her Adderall RX dated February 18, 2015. This RX was shredded.

## 2015-10-02 ENCOUNTER — Other Ambulatory Visit: Payer: Self-pay | Admitting: Neurology

## 2015-10-02 DIAGNOSIS — K3184 Gastroparesis: Secondary | ICD-10-CM

## 2015-10-02 DIAGNOSIS — G47419 Narcolepsy without cataplexy: Secondary | ICD-10-CM

## 2015-10-02 MED ORDER — AMPHETAMINE-DEXTROAMPHETAMINE 5 MG PO TABS: ORAL_TABLET | ORAL | Status: DC

## 2015-10-02 MED ORDER — AMPHETAMINE-DEXTROAMPHET ER 20 MG PO CP24: 20.0000 mg | ORAL_CAPSULE | Freq: Every day | ORAL | Status: DC

## 2015-10-02 NOTE — Telephone Encounter (Signed)
Pt returned [name]'s call. Gave clinic hours

## 2015-10-02 NOTE — Telephone Encounter (Signed)
Pt called requesting refill for amphetamine-dextroamphetamine (ADDERALL XR) 20 MG 24 hr capsule and amphetamine-dextroamphetamine (ADDERALL) 5 MG tablet . Pt advised [facility] closes at 12 on Friday

## 2015-10-02 NOTE — Telephone Encounter (Signed)
If pt calls back, please advise her that her RX is ready for pick up and of clinic hours.

## 2015-10-02 NOTE — Telephone Encounter (Signed)
I called to inform pt that her RXs for adderall are ready for pick up at the front desk and give clinic hours. I left a message asking her to call me back.

## 2015-11-10 ENCOUNTER — Telehealth: Payer: Self-pay | Admitting: *Deleted

## 2015-11-10 ENCOUNTER — Other Ambulatory Visit: Payer: Self-pay | Admitting: Neurology

## 2015-11-10 DIAGNOSIS — G47419 Narcolepsy without cataplexy: Secondary | ICD-10-CM

## 2015-11-10 DIAGNOSIS — K3184 Gastroparesis: Secondary | ICD-10-CM

## 2015-11-10 MED ORDER — AMPHETAMINE-DEXTROAMPHETAMINE 5 MG PO TABS: ORAL_TABLET | ORAL | Status: DC

## 2015-11-10 MED ORDER — AMPHETAMINE-DEXTROAMPHET ER 20 MG PO CP24: 20.0000 mg | ORAL_CAPSULE | Freq: Every day | ORAL | Status: DC

## 2015-11-10 NOTE — Telephone Encounter (Signed)
Adderall XR 20mg  and Adderall 5mg  rx's faxed to [facility] fax # [phone]/fim

## 2015-11-10 NOTE — Telephone Encounter (Signed)
Patient called to request refill of amphetamine-dextroamphetamine (ADDERALL XR) 20 MG 24 hr capsule and amphetamine-dextroamphetamine (ADDERALL) 5 MG tablet.

## 2015-11-10 NOTE — Telephone Encounter (Signed)
Sent to [facility] for review 

## 2015-11-10 NOTE — Telephone Encounter (Signed)
Adderall RXs cannot be faxed. I called the pt to advise her that her RX is ready for pick up at the front desk. No answer, left a message asking her to call me back. If pt calls back, please advise her of this information and clinic hours.

## 2015-11-13 ENCOUNTER — Telehealth: Payer: Self-pay

## 2015-11-13 NOTE — Telephone Encounter (Signed)
Received a pa request from [facility] [address] for pt's adderall xr 20mg . I called pt's plan and they confirmed that pt's pa is already on file from 07/05/2015 to 09/02/2016. I was directed to customer service who can see that the pharmacy is doing something wrong on their end. They advised me to tell the pharmacy to call [phone] to resolve the problem. I faxed the pa back to [facility] with this information attached.

## 2015-12-11 ENCOUNTER — Other Ambulatory Visit: Payer: Self-pay | Admitting: Neurology

## 2015-12-11 DIAGNOSIS — G47419 Narcolepsy without cataplexy: Secondary | ICD-10-CM

## 2015-12-11 DIAGNOSIS — K3184 Gastroparesis: Secondary | ICD-10-CM

## 2015-12-11 DIAGNOSIS — S99829A Other specified injuries of unspecified foot, initial encounter: Secondary | ICD-10-CM | POA: Diagnosis not present

## 2015-12-11 DIAGNOSIS — H578 Other specified disorders of eye and adnexa: Secondary | ICD-10-CM | POA: Diagnosis not present

## 2015-12-11 MED ORDER — AMPHETAMINE-DEXTROAMPHETAMINE 5 MG PO TABS: ORAL_TABLET | ORAL | Status: DC

## 2015-12-11 MED ORDER — AMPHETAMINE-DEXTROAMPHET ER 20 MG PO CP24: 20.0000 mg | ORAL_CAPSULE | Freq: Every day | ORAL | Status: DC

## 2015-12-11 NOTE — Addendum Note (Signed)
Addended by: Lester Hershey A on: 12/11/2015 05:25 PM   Modules accepted: Orders

## 2015-12-11 NOTE — Telephone Encounter (Signed)
Pt called requesting refill for amphetamine-dextroamphetamine (ADDERALL XR) 20 MG 24 hr capsule and amphetamine-dextroamphetamine (ADDERALL) 5 MG tablet . Pt is aware the office closes at 12 tomorrow (Friday)

## 2015-12-11 NOTE — Telephone Encounter (Signed)
The RX that Dr. Brett Fairy approved did not print out. I reprinted the RX. I spoke to pt and advised her that her RX is ready for pick up at the front desk. Pt verbalized understanding. Pt is aware of clinic hours on Friday.

## 2015-12-15 ENCOUNTER — Ambulatory Visit: Payer: Federal, State, Local not specified - PPO | Attending: Pain Medicine | Admitting: Pain Medicine

## 2015-12-15 ENCOUNTER — Encounter: Payer: Self-pay | Admitting: Pain Medicine

## 2015-12-15 ENCOUNTER — Other Ambulatory Visit: Payer: Self-pay | Admitting: Pain Medicine

## 2015-12-15 ENCOUNTER — Other Ambulatory Visit
Admission: RE | Admit: 2015-12-15 | Discharge: 2015-12-15 | Disposition: A | Discharge status: Home / Self Care | Payer: Federal, State, Local not specified - PPO | Source: Ambulatory Visit | Attending: Pain Medicine | Admitting: Pain Medicine

## 2015-12-15 ENCOUNTER — Encounter: Payer: Self-pay | Admitting: Sports Medicine

## 2015-12-15 ENCOUNTER — Ambulatory Visit (INDEPENDENT_AMBULATORY_CARE_PROVIDER_SITE_OTHER): Payer: Federal, State, Local not specified - PPO | Admitting: Sports Medicine

## 2015-12-15 VITALS — BP 134/82 | HR 87 | Resp 16

## 2015-12-15 VITALS — BP 122/76 | HR 92 | Temp 98.5°F | Resp 16 | Ht 62.0 in | Wt 156.0 lb

## 2015-12-15 DIAGNOSIS — M79605 Pain in left leg: Secondary | ICD-10-CM | POA: Diagnosis not present

## 2015-12-15 DIAGNOSIS — M545 Low back pain, unspecified: Secondary | ICD-10-CM

## 2015-12-15 DIAGNOSIS — K047 Periapical abscess without sinus: Secondary | ICD-10-CM | POA: Insufficient documentation

## 2015-12-15 DIAGNOSIS — M5416 Radiculopathy, lumbar region: Secondary | ICD-10-CM

## 2015-12-15 DIAGNOSIS — G2581 Restless legs syndrome: Secondary | ICD-10-CM | POA: Insufficient documentation

## 2015-12-15 DIAGNOSIS — F319 Bipolar disorder, unspecified: Secondary | ICD-10-CM | POA: Insufficient documentation

## 2015-12-15 DIAGNOSIS — D72829 Elevated white blood cell count, unspecified: Secondary | ICD-10-CM | POA: Insufficient documentation

## 2015-12-15 DIAGNOSIS — I10 Essential (primary) hypertension: Secondary | ICD-10-CM | POA: Insufficient documentation

## 2015-12-15 DIAGNOSIS — F429 Obsessive-compulsive disorder, unspecified: Secondary | ICD-10-CM | POA: Diagnosis not present

## 2015-12-15 DIAGNOSIS — J45909 Unspecified asthma, uncomplicated: Secondary | ICD-10-CM | POA: Diagnosis not present

## 2015-12-15 DIAGNOSIS — L603 Nail dystrophy: Secondary | ICD-10-CM | POA: Diagnosis not present

## 2015-12-15 DIAGNOSIS — R112 Nausea with vomiting, unspecified: Secondary | ICD-10-CM | POA: Insufficient documentation

## 2015-12-15 DIAGNOSIS — R109 Unspecified abdominal pain: Secondary | ICD-10-CM | POA: Insufficient documentation

## 2015-12-15 DIAGNOSIS — G9619 Other disorders of meninges, not elsewhere classified: Secondary | ICD-10-CM | POA: Diagnosis not present

## 2015-12-15 DIAGNOSIS — K3184 Gastroparesis: Secondary | ICD-10-CM | POA: Insufficient documentation

## 2015-12-15 DIAGNOSIS — Z5181 Encounter for therapeutic drug level monitoring: Secondary | ICD-10-CM

## 2015-12-15 DIAGNOSIS — Z79891 Long term (current) use of opiate analgesic: Secondary | ICD-10-CM | POA: Diagnosis not present

## 2015-12-15 DIAGNOSIS — N3281 Overactive bladder: Secondary | ICD-10-CM | POA: Insufficient documentation

## 2015-12-15 DIAGNOSIS — K811 Chronic cholecystitis: Secondary | ICD-10-CM | POA: Insufficient documentation

## 2015-12-15 DIAGNOSIS — K5289 Other specified noninfective gastroenteritis and colitis: Secondary | ICD-10-CM | POA: Insufficient documentation

## 2015-12-15 DIAGNOSIS — R0600 Dyspnea, unspecified: Secondary | ICD-10-CM | POA: Insufficient documentation

## 2015-12-15 DIAGNOSIS — M79604 Pain in right leg: Secondary | ICD-10-CM

## 2015-12-15 DIAGNOSIS — G8929 Other chronic pain: Secondary | ICD-10-CM

## 2015-12-15 DIAGNOSIS — M47816 Spondylosis without myelopathy or radiculopathy, lumbar region: Secondary | ICD-10-CM

## 2015-12-15 DIAGNOSIS — M79674 Pain in right toe(s): Secondary | ICD-10-CM

## 2015-12-15 DIAGNOSIS — R197 Diarrhea, unspecified: Secondary | ICD-10-CM | POA: Insufficient documentation

## 2015-12-15 DIAGNOSIS — G43909 Migraine, unspecified, not intractable, without status migrainosus: Secondary | ICD-10-CM | POA: Insufficient documentation

## 2015-12-15 DIAGNOSIS — F209 Schizophrenia, unspecified: Secondary | ICD-10-CM | POA: Insufficient documentation

## 2015-12-15 DIAGNOSIS — M79675 Pain in left toe(s): Secondary | ICD-10-CM

## 2015-12-15 DIAGNOSIS — E1143 Type 2 diabetes mellitus with diabetic autonomic (poly)neuropathy: Secondary | ICD-10-CM | POA: Insufficient documentation

## 2015-12-15 DIAGNOSIS — G47411 Narcolepsy with cataplexy: Secondary | ICD-10-CM | POA: Insufficient documentation

## 2015-12-15 DIAGNOSIS — M4316 Spondylolisthesis, lumbar region: Secondary | ICD-10-CM | POA: Diagnosis not present

## 2015-12-15 DIAGNOSIS — F418 Other specified anxiety disorders: Secondary | ICD-10-CM | POA: Insufficient documentation

## 2015-12-15 DIAGNOSIS — M549 Dorsalgia, unspecified: Secondary | ICD-10-CM | POA: Diagnosis present

## 2015-12-15 LAB — COMPREHENSIVE METABOLIC PANEL
ALBUMIN: 4.2 g/dL (ref 3.5–5.0)
ALT: 30 U/L (ref 14–54)
ANION GAP: 5 (ref 5–15)
AST: 30 U/L (ref 15–41)
Alkaline Phosphatase: 55 U/L (ref 38–126)
BUN: 8 mg/dL (ref 6–20)
CO2: 30 mmol/L (ref 22–32)
Calcium: 9 mg/dL (ref 8.9–10.3)
Chloride: 103 mmol/L (ref 101–111)
Creatinine, Ser: 0.54 mg/dL (ref 0.44–1.00)
GFR calc Af Amer: >60 mL/min (ref >60)
GFR calc non Af Amer: >60 mL/min (ref >60)
GLUCOSE: 85 mg/dL (ref 65–99)
POTASSIUM: 3.7 mmol/L (ref 3.5–5.1)
SODIUM: 138 mmol/L (ref 135–145)
TOTAL PROTEIN: 7.7 g/dL (ref 6.5–8.1)
Total Bilirubin: 0.4 mg/dL (ref 0.3–1.2)

## 2015-12-15 LAB — C-REACTIVE PROTEIN: CRP: 0.6 mg/dL (ref <1.0)

## 2015-12-15 LAB — VITAMIN B12: VITAMIN B 12: 918 pg/mL — AB (ref 180–914)

## 2015-12-15 LAB — SEDIMENTATION RATE: SED RATE: 25 mm/h — AB (ref 0–20)

## 2015-12-15 LAB — MAGNESIUM: Magnesium: 1.8 mg/dL (ref 1.7–2.4)

## 2015-12-15 MED ORDER — FENTANYL 12 MCG/HR TD PT72: 12.5000 ug | MEDICATED_PATCH | TRANSDERMAL | Status: DC

## 2015-12-15 MED ORDER — DICLOFENAC SODIUM 50 MG PO TBEC: 50.0000 mg | DELAYED_RELEASE_TABLET | Freq: Two times a day (BID) | ORAL | Status: AC

## 2015-12-15 MED ORDER — TIZANIDINE HCL 4 MG PO TABS: 4.0000 mg | ORAL_TABLET | Freq: Three times a day (TID) | ORAL | Status: DC | PRN

## 2015-12-15 MED ORDER — FENTANYL 25 MCG/HR TD PT72: 25.0000 ug | MEDICATED_PATCH | TRANSDERMAL | Status: DC

## 2015-12-15 NOTE — Progress Notes (Signed)
Safety precautions to be maintained throughout the outpatient stay will include: orient to surroundings, keep bed in low position, maintain call bell within reach at all times, provide assistance with transfer out of bed and ambulation.  

## 2015-12-15 NOTE — Patient Instructions (Addendum)
Labwork was ordered. Please have this done as soon as possible. Take tizanidine when you begin feeling effects from getting off of the fentanyl  Drug Holidays  Definitions Tolerance:  Defined as the progressively decreased responsiveness to a drug.  Occurs when the drug is used repeatedly and the body adapts to the continued presence of the drug.  As a result, a larger dose of the drug is needed to achieve the effect originally obtained by a smaller dose.  It is thought to be due to the formation of excess opioid receptors.  Drug Holiday:   Is when a patient stops taking a medication(s) for a period of time; anywhere from a few days to several weeks.  Withdrawals:   Refers to the wide range of symptoms that occur after stopping or dramatically reducing opiate drugs after heavy or prolonged use.  Withdrawal symptoms do not occur to patients that use low dose opioids, or those who take the medication sporadically.  Contrary to benzodiazepine (example: Valium, Xanax, etc.) or alcohol withdrawals ("Delirium Tremens"), opioid withdrawals are not lethal.  Withdrawals are the physical manifestation of the body getting rid of the excess receptors.  Purpose To eliminate tolerance.  Duration of Holiday 14 consecutive days. (2 weeks)  Expected Symptoms Early symptoms of withdrawal include:   Agitation  Anxiety  Muscle Aches   Increased tearing  Insomnia  Runny Nose   Sweating  Yawning     Late symptoms of withdrawal include:   Abdominal cramping  Diarrhea  Dilated pupils   Goose bumps  Nausea  Vomiting   Opioid withdrawal reactions are very uncomfortable but are not life-threatening.  Symptoms usually start within 12 hours of last opioid dose and within 30 hours of last methadone exposure.  Duration of Symptoms 48-72 hours of short acting medications and 2 to 4 days for methadone.  Treatment  Clonidine (Catepres) or tizanidine (Zanaflex) for agitation, sweating, tearing,  runny nose.  Promethazine (Phenergan) for nausea, vomiting.  NSAIDs for pain.  Benefits 1. Improved effectiveness of opioids. 2. Decreased opioid dose needed to achieve benefits. Improved pain with lesser dose.

## 2015-12-15 NOTE — Progress Notes (Signed)
Patient's Name: [name]  Patient type: Established  MRN: [id number]  Service setting: Ambulatory outpatient  DOB: 04-20-1971  Location: [facility]  DOS: 12/15/2015  Primary Care Physician: Courtney Coffin, MD  Note by: Kathlen Brunswick. Dossie Arbour, M.D, DABA, [place], [place], [place], [place]  Referring Physician: Alroy Dust, Carlean Jews.Marlou Sa, MD  Specialty: Board-Certified Interventional Pain Management     Primary Reason(s) for Visit: Encounter for prescription drug management (Level of risk: moderate) CC: Back Pain   HPI  [name] is a 45 y.o. year old, female patient, who returns today as an established patient. She has Dyspnea; Narcolepsy with cataplexy; UARS (upper airway resistance syndrome); Chronic cholecystitis; Chronic lower back pain (L>R) (Primary Pain); Medication monitoring encounter; Nausea and vomiting; Nausea vomiting and diarrhea; Abdominal pain; Extrinsic asthma; Leukocytosis; Colitis, acute; Dental infection; Nausea with vomiting; Gastroparesis due to DM ([place]); Enteritis; Chronic pain; Opiate use; Opioid dependence ([place]); Long term prescription opiate use; Long term current use of opiate analgesic; Encounter for therapeutic drug level monitoring; Failed back surgical syndrome (x2); Gastric atony; Epidural fibrosis; Chronic lumbar radicular pain (Bilateral) (L>R); Bilateral lower extremity pain (L>R); Drug tolerance to opioids; Opiate analgesic contract exists; Lumbar spondylosis; Lumbar facet hypertrophy; Grade 1 Retrolisthesis of L1 over L2; Grade 1 Anterolisthesis of L5 over S1; Lumbar facet arthropathy; Other iron deficiency anemias; and Nondiabetic gastroparesis on her problem list.. Her primarily concern today is the Back Pain   Pain Assessment: Self-Reported Pain Score: 9  Reported level is compatible with observation Pain Type: Chronic pain Pain Location: Back Pain Orientation: Lower Pain Descriptors / Indicators: Throbbing, Aching, Shooting Pain Frequency:  Constant  The patient comes in today clinics today for pharmacological management of her chronic pain after last time being seen in over of 2016. Clearly she has been getting her medications elsewhere and she indicates that she was given them from Dr. Nelva Bush. As it turns out, [facility] has been doing a lot of her nerve blocks and interventional management. Today I took the time to explain to the patient that we do not take patients for medication management only and many patients for whom we prescribed medications, are supposed to be getting all third nerve blocks in house.  Date of Last Visit: 07/02/15 Service Provided on Last Visit: Evaluation (new patient)  Controlled Substance Pharmacotherapy Assessment  Analgesic: Duragesic 25 mcg/h every 48 hours. Pill Count: The patient did not bring any of her medications or patches. MME/day: 60 mg/day.  Pharmacokinetics: Onset of action (Liberation/Absorption): Within expected pharmacological parameters Time to Peak effect (Distribution): Timing and results are as within normal expected parameters Duration of action (Metabolism/Excretion): Within normal limits for medication Pharmacodynamics: Analgesic Effect: More than 50% Activity Facilitation: Medication(s) allow patient to sit, stand, walk, and do the basic ADLs Perceived Effectiveness: Described as relatively effective, allowing for increase in activities of daily living (ADL) Side-effects or Adverse reactions: None reported Monitoring: [place] PMP: Online review of the past 59-month period conducted. Compliant with practice rules and regulations UDS Results/interpretation: The patient's last UDS was done on 07/02/2015 and it came back abnormal due to the presence of an undeclared benzodiazepine. Medication Assessment Form: Reviewed. Patient indicates being compliant with therapy Treatment compliance: Compliant Risk Assessment: Aberrant Behavior: None observed today Substance Use  Disorder (SUD) Risk Level: Low Risk of opioid abuse or dependence: 0.7-3.0% with doses ? 36 MME/day and 6.1-26% with doses ? 120 MME/day. Opioid Risk Tool (ORT) Score: Total Score: 1 Low Risk for SUD (Score <3) Depression Scale  Score: PHQ-2: PHQ-2 Total Score: 0 No depression (0) PHQ-9: PHQ-9 Total Score: 0 No depression (0-4)  Pharmacologic Plan: No change in therapy, at this time  Laboratory Chemistry  Inflammation Markers No results found for: ESRSEDRATE, CRP  Renal Function Lab Results  Component Value Date   BUN 7 08/28/2015   CREATININE 0.74 08/28/2015   GFRAA 112 08/28/2015   GFRNONAA 97 08/28/2015    Hepatic Function Lab Results  Component Value Date   AST 18 08/28/2015   ALT 9 08/28/2015   ALBUMIN 4.3 08/28/2015    Electrolytes Lab Results  Component Value Date   NA 143 08/28/2015   K 4.1 08/28/2015   CL 101 08/28/2015   CALCIUM 9.5 08/28/2015   MG 1.6* 04/09/2015    Pain Modulating Vitamins No results found for: [place], [id number], [id number], [id number], VITAMINB12  Coagulation Parameters No results found for: INR, LABPROT  Note: I personally reviewed the above data. Results shared with patient.  Meds  The patient has a current medication list which includes the following prescription(s): albuterol, amphetamine-dextroamphetamine, amphetamine-dextroamphetamine, budesonide-formoterol, clobetasol propionate, cyclosporine, dexlansoprazole, diclofenac, dicyclomine, dronabinol, fluocinolone acetonide scalp, fluocinonide cream, gabapentin, hydrochlorothiazide, hydrocortisone, hypercare, lidocaine, linzess, lorazepam, loteprednol, lurasidone, metoclopramide, minivelle, myrbetriq, nitroglycerin, olopatadine hcl, onabotulinumtoxina, pantoprazole, pramipexole, promethazine, relpax, saccharomyces boulardii, tizanidine, topiramate, triamcinolone cream, verapamil, vitamin d (ergocalciferol), fentanyl, and fentanyl.  Current Outpatient Prescriptions on File Prior to  Visit  Medication Sig  . albuterol (PROVENTIL HFA;VENTOLIN HFA) 108 (90 BASE) MCG/ACT inhaler Inhale 2 puffs into the lungs every 6 (six) hours as needed for wheezing or shortness of breath.  . amphetamine-dextroamphetamine (ADDERALL XR) 20 MG 24 hr capsule Take 1 capsule (20 mg total) by mouth daily.  Marland Kitchen amphetamine-dextroamphetamine (ADDERALL) 5 MG tablet Take 1 tablet daily at 1PM.  . budesonide-formoterol (SYMBICORT) 160-4.5 MCG/ACT inhaler Inhale 2 puffs into the lungs 2 (two) times daily.    . Clobetasol Propionate (CLOBEX) 0.05 % shampoo Apply 1 application topically every Wednesday.   . cycloSPORINE (RESTASIS) 0.05 % ophthalmic emulsion Place 1 drop into both eyes every 12 (twelve) hours.    Marland Kitchen dexlansoprazole (DEXILANT) 60 MG capsule Take 60 mg by mouth daily. Reported on 08/28/2015  . diclofenac (VOLTAREN) 50 MG EC tablet TK 1 T PO BID PRN  . dicyclomine (BENTYL) 10 MG capsule Take 10 mg by mouth 4 (four) times daily as needed for spasms.  Marland Kitchen FLUOCINOLONE ACETONIDE SCALP 0.01 % OIL Apply 1 application topically daily.  . fluocinonide (LIDEX) 0.05 % cream Apply 1 application topically 3 (three) times a week.   . gabapentin (NEURONTIN) 300 MG capsule Take 1 capsule (300 mg total) by mouth 3 (three) times daily.  . hydrochlorothiazide (HYDRODIURIL) 25 MG tablet Take 25 mg by mouth every morning.   . hydrocortisone (PROCTOCORT) 1 % CREA Apply topically.  Marland Kitchen HYPERCARE 20 % external solution Apply 1 application topically daily.   Marland Kitchen lidocaine (XYLOCAINE) 2 % jelly 1 application by Other route as needed (rectally).  . LORazepam (ATIVAN) 0.5 MG tablet Take 0.5 mg by mouth every 6 (six) hours as needed for anxiety.  Marland Kitchen loteprednol (LOTEMAX) 0.2 % SUSP Place 1 drop into both eyes daily.  Marland Kitchen lurasidone (LATUDA) 40 MG TABS Take 40 mg by mouth at bedtime. Reported on 08/28/2015  . metoCLOPramide (REGLAN) 5 MG tablet Take 1 tablet (5 mg total) by mouth every 6 (six) hours as needed for nausea.  Marland Kitchen  MINIVELLE 0.1 MG/24HR patch Apply 1 patch topically 2 (two) times a week.  Marland Kitchen  nitroGLYCERIN (NITROGLYN) 2 % ointment Apply 1 inch topically at bedtime.  . Olopatadine HCl (PATADAY) 0.2 % SOLN Place 1 drop into both eyes 2 (two) times daily as needed (for allergies).   . OnabotulinumtoxinA (BOTOX IJ) Inject as directed every 3 (three) months.  . pantoprazole (PROTONIX) 40 MG tablet Take 1 tablet (40 mg total) by mouth daily. (Patient taking differently: Take 40 mg by mouth 2 (two) times daily. )  . pramipexole (MIRAPEX) 1 MG tablet Take 1 mg by mouth at bedtime.  . promethazine (PHENERGAN) 25 MG tablet Take 50 mg by mouth every 6 (six) hours as needed for nausea or vomiting.   Marland Kitchen RELPAX 40 MG tablet Take 40 mg by mouth every 2 (two) hours as needed for migraine or headache.   . saccharomyces boulardii (FLORASTOR) 250 MG capsule Take 1 capsule (250 mg total) by mouth 2 (two) times daily.  Marland Kitchen topiramate (TOPAMAX) 100 MG tablet Take 100-200 mg by mouth 2 (two) times daily. 1 tab every AM and 2 at night  . triamcinolone cream (KENALOG) 0.1 % Apply 1 application topically daily as needed (to affected area).   . verapamil (VERELAN PM) 240 MG 24 hr capsule Take 240 mg by mouth at bedtime.    . Vitamin D, Ergocalciferol, (DRISDOL) 50000 UNITS CAPS capsule Take 50,000 Units by mouth 2 (two) times a week.   No current facility-administered medications on file prior to visit.    ROS  Constitutional: Afebrile, no chills, well hydrated and well nourished Gastrointestinal: No upper or lower GI bleeding, no nausea, no vomiting and no acute GI distress Musculoskeletal: No acute joint swelling or redness, no acute loss of range of motion and no acute onset weakness Neurological: Denies any acute onset apraxia, no episodes of paralysis, no acute loss of coordination, no acute loss of consciousness and no acute onset aphasia, dysarthria, agnosia, or amnesia  Allergies  Ms. Junior is allergic to adhesive; aspirin;  benadryl; codeine; dust mite extract; mold extract; pollen extract; prednisone; toradol; and zofran.  [place]  Medical:  Ms. Beld  has a past medical history of Hypertension; Overactive bladder; Esophageal reflux; Migraine; Psychosis; Schizophrenia, schizo-affective ([place]); OCD (obsessive compulsive disorder); Bipolar 1 disorder ([place]); Restless leg syndrome; S/P lumbar fusion (2011); Torn rotator cuff (it:2012,rt:2013); Migraines; Depression; Anxiety; Narcolepsy; Migraine; Hypersomnia; Asthma; Sleep apnea; Seasonal allergies; Schizophrenia ([place]); Osteoarthritis; Chronic lower back pain (02/07/2014); Medication monitoring encounter (02/07/2014); Gastroparesis; Allergy; Opiate use (07/02/2015); and Other iron deficiency anemias (08/28/2015). Family: family history includes Cancer in her father, maternal grandmother, mother, paternal aunt, paternal aunt, and paternal grandmother; High Cholesterol in her father; Hypertension in her father; Thyroid disease in her father and sister. There is no history of Narcolepsy. Surgical:  has past surgical history that includes Vesicovaginal fistula closure w/ TAH (2001); Tubal ligation (2004); Shoulder surgery; Abdominal hysterectomy (2001); spurs,ruptured disc bone (2009); Lumbar fusion (2011); Rotator cuff repair [id number]); Oophorectomy ( 2004); Ankle Fusion; Cholecystectomy (N/A, 07/20/2013); Colonoscopy with propofol (N/A, 10/02/2013); Esophagogastroduodenoscopy (egd) with propofol (N/A, 10/02/2013); Wisdom tooth extraction; Laparoscopic salpingo oophorectomy (Left, 12/05/2013); Flexible sigmoidoscopy (Left, 04/07/2015); Fracture surgery; and Spine surgery. Tobacco:  reports that she has never smoked. She has never used smokeless tobacco. Alcohol:  reports that she does not drink alcohol. Drug:  reports that she does not use illicit drugs.  Physical Examination  Constitutional Vitals:  Today's Vitals   12/15/15 0843  BP: 122/76  Pulse: 92  Temp: 98.5 F (36.9 C)  Resp:  16  Height: 5\' 2"  (  1.575 m)  Weight: 156 lb (70.761 kg)  SpO2: 99%  PainSc: 9   PainLoc: Back   Calculated BMI: Body mass index is 28.53 kg/(m^2).    General appearance: alert, cooperative, appears stated age and no distress Eyes: PERLA Respiratory: No evidence respiratory distress, no audible rales or ronchi and no use of accessory muscles of respiration  Cervical Spine Exam  Inspection: Normal anatomy, no anomalies observed Cervical Lordosis: Normal Alignment: Symetrical Functional ROM: Within functional limits (WFL) AROM: WFL Sensory: No sensory abnormalities reported  Upper Extremity Exam    Right  Left  Inspection: No gross anomalies detected  Inspection: No gross anomalies detected  Functional ROM: Adequate  Functional ROM: Adequate  AROM: Adequate  AROM: Adequate  Sensory: Normal  No sensory abnormalities reported  Sensory: Normal  No sensory abnormalities reported  Motor: Unremarkable  Motor: Unremarkable  Vascular: Normal skin color, temperature, and hair growth. No peripheral edema or cyanosis  Vascular: Normal skin color, temperature, and hair growth. No peripheral edema or cyanosis   Thoracic Spine  Inspection: No gross anomalies detected Alignment: Symetrical Functional ROM: Within functional limits [facility]) AROM: Adequate Palpation: WNL  Lumbar Spine  Inspection: No gross anomalies detected Alignment: Symetrical Functional ROM: Within functional limits [facility]) AROM: Adequate Palpation: WNL Provocative Tests: Lumbar Hyperextension and rotation test: deferred Patrick's Maneuver: deferred  Gait Assessment  Gait: WNL  Lower Extremities    Right  Left  Inspection: No gross anomalies detected  Inspection: No gross anomalies detected  Functional ROM: Within functional limits [facility])  Functional ROM: Within functional limits (WFL)  AROM: Adequate  AROM: Adequate  Sensory: Normal  Sensory: Normal  Motor: Unremarkable  Motor: Unremarkable  Toe walk (S1): WNL  Toe  walk (S1): WNL  Heal walk (L5): WNL  Heal walk (L5): WNL  Pulses: Palpable  Pulses: Palpable  DTR:   DTR:   Patellar (L4): WNL  Patellar (L4): WNL  Achilles (S1): WNL  Achilles (S1): WNL   Assessment & Plan  Primary Diagnosis & Pertinent Problem List: The primary encounter diagnosis was Chronic pain. Diagnoses of Encounter for therapeutic drug level monitoring, Long term current use of opiate analgesic, Chronic lower back pain (L>R) (Primary Pain), Bilateral lower extremity pain (L>R), and Chronic lumbar radicular pain (Bilateral) (L>R) were also pertinent to this visit.  Visit Diagnosis: 1. Chronic pain   2. Encounter for therapeutic drug level monitoring   3. Long term current use of opiate analgesic   4. Chronic lower back pain (L>R) (Primary Pain)   5. Bilateral lower extremity pain (L>R)   6. Chronic lumbar radicular pain (Bilateral) (L>R)     Problem-specific Plan(s): No problem-specific assessment & plan notes found for this encounter.   Plan of Care   Problem List Items Addressed This Visit      High   Chronic pain - Primary (Chronic)   Relevant Medications   fentaNYL (DURAGESIC - DOSED MCG/HR) 25 MCG/HR patch   fentaNYL (DURAGESIC) 12 MCG/HR   tiZANidine (ZANAFLEX) 4 MG tablet   Other Relevant Orders   Comprehensive metabolic panel   C-reactive protein   Magnesium   Sedimentation rate   Vitamin B12   Vitamin D 1,25 dihydroxy   Chronic lumbar radicular pain (Bilateral) (L>R) (Chronic)   Chronic lower back pain (L>R) (Primary Pain) (Chronic)   Relevant Medications   fentaNYL (DURAGESIC - DOSED MCG/HR) 25 MCG/HR patch   fentaNYL (DURAGESIC) 12 MCG/HR   tiZANidine (ZANAFLEX) 4 MG tablet   Bilateral lower extremity  pain (L>R) (Chronic)     Medium   Long term current use of opiate analgesic (Chronic)   Relevant Orders   ToxASSURE Select 13 (MW), Urine   Encounter for therapeutic drug level monitoring       Pharmacotherapy (Medications Ordered): Meds  ordered this encounter  Medications  . fentaNYL (DURAGESIC - DOSED MCG/HR) 25 MCG/HR patch    Sig: Place 1 patch (25 mcg total) onto the skin every 3 (three) days.    Dispense:  2 patch    Refill:  0    Do not place this medication, or any other prescription from our practice, on "Automatic Refill". Patient may have prescription filled one day early if pharmacy is closed on scheduled refill date. Do not fill until: 12/15/15 To last until: 12/21/15  . fentaNYL (DURAGESIC) 12 MCG/HR    Sig: Place 1 patch (12.5 mcg total) onto the skin every 3 (three) days.    Dispense:  2 patch    Refill:  0    Do not place this medication, or any other prescription from our practice, on "Automatic Refill". Patient may have prescription filled one day early if pharmacy is closed on scheduled refill date. Do not fill until: 12/21/15 To last until: 12/27/15  . tiZANidine (ZANAFLEX) 4 MG tablet    Sig: Take 1 tablet (4 mg total) by mouth every 8 (eight) hours as needed for muscle spasms.    Dispense:  90 tablet    Refill:  1    Do not place this medication, or any other prescription from our practice, on "Automatic Refill". Patient may have prescription filled one day early if pharmacy is closed on scheduled refill date.   Lab-work & Procedure Ordered: Orders Placed This Encounter  Procedures  . ToxASSURE Select 13 (MW), Urine  . Comprehensive metabolic panel  . C-reactive protein  . Magnesium  . Sedimentation rate  . Vitamin B12  . Vitamin D 1,25 dihydroxy    Imaging Ordered: None  Interventional Therapies: Scheduled:  None at this time.    Considering:  None at this time.    PRN Procedures:  None at this time.    Referral(s) or Consult(s): None at this time.  New Prescriptions   FENTANYL (DURAGESIC - DOSED MCG/HR) 25 MCG/HR PATCH    Place 1 patch (25 mcg total) onto the skin every 3 (three) days.   FENTANYL (DURAGESIC) 12 MCG/HR    Place 1 patch (12.5 mcg total) onto the skin every 3  (three) days.    Medications administered during this visit: Ms. Mckissick had no medications administered during this visit.  Future Appointments Date Time Provider [place]  12/15/2015 1:30 PM Landis Martins, DPM [facility]  02/26/2016 3:00 PM Ward Givens, NP [facility] None    Primary Care Physician: Courtney Coffin, MD Location: [facility] Note by: Kathlen Brunswick. Dossie Arbour, M.D, DABA, DABAPM, DABPM, DABIPP, FIPP  Pain Score Disclaimer: We use the NRS-11 scale. This is a self-reported, subjective measurement of pain severity with only modest accuracy. It is used primarily to identify changes within a particular patient. It must be understood that outpatient pain scales are significantly less accurate that those used for research, where they can be applied under ideal controlled circumstances with minimal exposure to variables. In reality, the score is likely to be a combination of pain intensity and pain affect, where pain affect describes the degree of emotional arousal or changes in action readiness caused by the sensory experience of  pain. Factors such as social and work situation, setting, emotional state, anxiety levels, expectation, and prior pain experience may influence pain perception and show large inter-individual differences that may also be affected by time variables.

## 2015-12-15 NOTE — Progress Notes (Signed)
Patient ID: [name], female   DOB: [birth year], 45 y.o.   MRN: [id number]  Subjective: [name] is a 45 y.o. female patient seen today in office with complaint of painful big toenails; states that they fell off about 4 days and since have been tender; reports that she started to notice blood underneath the nail back in December. Denies constitutional symptoms. Patient has no other pedal complaints at this time.   Patient Active Problem List   Diagnosis Date Noted  . Other iron deficiency anemias 08/28/2015  . Nondiabetic gastroparesis 08/28/2015  . Chronic lower back pain (L>R) (Primary Pain) 07/02/2015  . Medication monitoring encounter 07/02/2015  . Chronic pain 07/02/2015  . Opiate use 07/02/2015  . Opioid dependence ([place]) 07/02/2015  . Long term prescription opiate use 07/02/2015  . Long term current use of opiate analgesic 07/02/2015  . Encounter for therapeutic drug level monitoring 07/02/2015  . 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 Epidural fibrosis 07/02/2015  . Chronic lumbar radicular pain (Bilateral) (L>R) 07/02/2015  . Bilateral lower extremity pain (L>R) 07/02/2015  . Drug tolerance to opioids 07/02/2015  . Opiate analgesic contract exists 07/02/2015  . Lumbar spondylosis 07/02/2015  . Lumbar facet hypertrophy 07/02/2015  . Grade 1 Retrolisthesis of L1 over L2 07/02/2015  . Grade 1 Anterolisthesis of L5 over S1 07/02/2015  . Lumbar facet arthropathy 07/02/2015  . 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 Enteritis 04/06/2015  . Gastroparesis due to DM ([place])   . Colitis, acute 04/04/2015  . Dental infection 04/04/2015  . Nausea with vomiting   . Leukocytosis 04/03/2015  . Nausea and vomiting 04/02/2015  . Nausea vomiting and diarrhea 04/02/2015  . Abdominal pain 04/02/2015  . Extrinsic asthma 04/02/2015  . Chronic cholecystitis 07/02/2013  . 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 UARS (upper airway resistance syndrome) 02/14/2013  . Dyspnea 06/09/2011     Current Outpatient Prescriptions on File Prior to Visit  Medication Sig Dispense Refill  . albuterol (PROVENTIL HFA;VENTOLIN HFA) 108 (90 BASE) MCG/ACT inhaler Inhale 2 puffs into the lungs every 6 (six) hours as needed for wheezing or shortness of breath.    . amphetamine-dextroamphetamine (ADDERALL XR) 20 MG 24 hr capsule Take 1 capsule (20 mg total) by mouth daily. 30 capsule 0  . amphetamine-dextroamphetamine (ADDERALL) 5 MG tablet Take 1 tablet daily at 1PM. 30 tablet 0  . budesonide-formoterol (SYMBICORT) 160-4.5 MCG/ACT inhaler Inhale 2 puffs into the lungs 2 (two) times daily.      . Clobetasol Propionate (CLOBEX) 0.05 % shampoo Apply 1 application topically every Wednesday.     . cycloSPORINE (RESTASIS) 0.05 % ophthalmic emulsion Place 1 drop into both eyes every 12 (twelve) hours.      Marland Kitchen dexlansoprazole (DEXILANT) 60 MG capsule Take 60 mg by mouth daily. Reported on 08/28/2015    . diclofenac (VOLTAREN) 50 MG EC tablet Take 1 tablet (50 mg total) by mouth 2 (two) times daily. 60 tablet 0  . dicyclomine (BENTYL) 10 MG capsule Take 10 mg by mouth 4 (four) times daily as needed for spasms.    Marland Kitchen dronabinol (MARINOL) 2.5 MG capsule TK 1 C PO BID  3  . fentaNYL (DURAGESIC - DOSED MCG/HR) 25 MCG/HR patch Place 1 patch (25 mcg total) onto the skin every 3 (three) days. 2 patch 0  . fentaNYL (DURAGESIC) 12 MCG/HR Place 1 patch (12.5 mcg total) onto the skin every 3 (three) days. 2 patch 0  . FLUOCINOLONE ACETONIDE SCALP 0.01 % OIL  Apply 1 application topically daily.  0  . fluocinonide (LIDEX) 0.05 % cream Apply 1 application topically 3 (three) times a week.     . gabapentin (NEURONTIN) 300 MG capsule Take 1 capsule (300 mg total) by mouth 3 (three) times daily. 90 capsule 0  . hydrochlorothiazide (HYDRODIURIL) 25 MG tablet Take 25 mg by mouth every morning.     . hydrocortisone (PROCTOCORT) 1 % CREA Apply topically.    Marland Kitchen HYPERCARE 20 % external solution Apply 1 application topically  daily.     Marland Kitchen lidocaine (XYLOCAINE) 2 % jelly 1 application by Other route as needed (rectally).    Marland Kitchen LINZESS 290 MCG CAPS capsule Reported on 12/15/2015  3  . LORazepam (ATIVAN) 0.5 MG tablet Take 0.5 mg by mouth every 6 (six) hours as needed for anxiety.    Marland Kitchen loteprednol (LOTEMAX) 0.2 % SUSP Place 1 drop into both eyes daily.    Marland Kitchen lurasidone (LATUDA) 40 MG TABS Take 40 mg by mouth at bedtime. Reported on 08/28/2015    . metoCLOPramide (REGLAN) 5 MG tablet Take 1 tablet (5 mg total) by mouth every 6 (six) hours as needed for nausea. 30 tablet 0  . MINIVELLE 0.1 MG/24HR patch Apply 1 patch topically 2 (two) times a week.  12  . MYRBETRIQ 25 MG TB24 tablet TK 1 T PO QD  3  . nitroGLYCERIN (NITROGLYN) 2 % ointment Apply 1 inch topically at bedtime.    . Olopatadine HCl (PATADAY) 0.2 % SOLN Place 1 drop into both eyes 2 (two) times daily as needed (for allergies).     . OnabotulinumtoxinA (BOTOX IJ) Inject as directed every 3 (three) months.    . pantoprazole (PROTONIX) 40 MG tablet Take 1 tablet (40 mg total) by mouth daily. (Patient taking differently: Take 40 mg by mouth 2 (two) times daily. ) 30 tablet 0  . pramipexole (MIRAPEX) 1 MG tablet Take 1 mg by mouth at bedtime.  0  . promethazine (PHENERGAN) 25 MG tablet Take 50 mg by mouth every 6 (six) hours as needed for nausea or vomiting.   3  . RELPAX 40 MG tablet Take 40 mg by mouth every 2 (two) hours as needed for migraine or headache.     . saccharomyces boulardii (FLORASTOR) 250 MG capsule Take 1 capsule (250 mg total) by mouth 2 (two) times daily. 60 capsule 1  . tiZANidine (ZANAFLEX) 4 MG tablet Take 1 tablet (4 mg total) by mouth every 8 (eight) hours as needed for muscle spasms. 90 tablet 1  . topiramate (TOPAMAX) 100 MG tablet Take 100-200 mg by mouth 2 (two) times daily. 1 tab every AM and 2 at night    . triamcinolone cream (KENALOG) 0.1 % Apply 1 application topically daily as needed (to affected area).     . verapamil (VERELAN PM) 240  MG 24 hr capsule Take 240 mg by mouth at bedtime.      . Vitamin D, Ergocalciferol, (DRISDOL) 50000 UNITS CAPS capsule Take 50,000 Units by mouth 2 (two) times a week.     No current facility-administered medications on file prior to visit.    Allergies  Allergen Reactions  . Adhesive [Tape] Rash    Glue from steri strips  . Aspirin Nausea Only  . Benadryl [Diphenhydramine Hcl] Other (See Comments)    Makes her hyper, jittery  . Codeine Nausea And Vomiting  . Dust Mite Extract Other (See Comments)    Cough, sneeze, eyes runny  . Mold  Extract [Trichophyton] Other (See Comments)    Cough, sneeze, eyes/nose runny  . Pollen Extract Other (See Comments)    Cough, sneeze, eyes/nose runny  . Prednisone Other (See Comments)    Makes her hyper, jittery   . Toradol [Ketorolac Tromethamine] Anxiety  . Zofran [Ondansetron] Nausea And Vomiting   Objective: Physical Exam  General: Well developed, nourished, no acute distress, awake, alert and oriented x 3  Vascular: Dorsalis pedis artery 2/4 bilateral, Posterior tibial artery 2/4 bilateral, skin temperature warm to warm proximal to distal bilateral lower extremities, no varicosities, pedal hair present bilateral.  Neurological: Gross sensation present via light touch bilateral.   Dermatological: Skin is warm, dry, and supple bilateral, Nails are within normal limits except at bilateral hallux with mild trauma line, no hematoma, no ingrowing, no signs of infection, no webspace macerations present bilateral, no open lesions present bilateral, no callus/corns/hyperkeratotic tissue present bilateral. No signs of infection bilateral.  Musculoskeletal: No boney deformities noted bilateral. Muscular strength within normal limits without pain on range of motion. No pain with calf compression bilateral.  Assessment and Plan:  Problem List Items Addressed This Visit    None    Visit Diagnoses    Nail dystrophy    -  Primary    Toe pain,  bilateral           -Examined patient.  -Discussed long term plan of care -Recommend close monitoring of nails as they grow back in; no acute symptoms or treatment necessary at this time -Patient to return as needed for follow up evaluation or sooner if symptoms worsen.  Landis Martins, DPM

## 2015-12-15 NOTE — Progress Notes (Deleted)
   Subjective:    Patient ID: [name], female    DOB: [birth year], 45 y.o.   MRN: [id number]  HPI    Review of Systems  Constitutional: Positive for diaphoresis, activity change, appetite change, fatigue and unexpected weight change.  HENT: Positive for ear pain, sinus pressure and sneezing.   Respiratory: Positive for shortness of breath and wheezing.   Gastrointestinal: Positive for nausea, abdominal pain, diarrhea, constipation, blood in stool and abdominal distention.  Endocrine: Positive for cold intolerance and heat intolerance.  Genitourinary: Positive for urgency and frequency.  Musculoskeletal: Positive for back pain and arthralgias.  Allergic/Immunologic: Positive for environmental allergies and food allergies.  Neurological: Positive for weakness, light-headedness, numbness and headaches.  Psychiatric/Behavioral: The patient is nervous/anxious.   All other systems reviewed and are negative.      Objective:   Physical Exam        Assessment & Plan:

## 2015-12-16 LAB — VITAMIN D 25 HYDROXY (VIT D DEFICIENCY, FRACTURES): Vit D, 25-Hydroxy: 23 ng/mL — ABNORMAL LOW (ref 30.0–100.0)

## 2015-12-17 DIAGNOSIS — E8941 Symptomatic postprocedural ovarian failure: Secondary | ICD-10-CM | POA: Diagnosis not present

## 2015-12-21 LAB — TOXASSURE SELECT 13 (MW), URINE: PDF: 0

## 2016-01-11 ENCOUNTER — Encounter: Payer: Self-pay | Admitting: Pain Medicine

## 2016-01-11 NOTE — Progress Notes (Signed)

## 2016-01-11 NOTE — Progress Notes (Signed)
Quick Note:  NOTE: This forensic urine drug screen (UDS) test was conducted using a state-of-the-art ultra high performance liquid chromatography and mass spectrometry system (UPLC/MS-MS), the most sophisticated and accurate method available. UPLC/MS-MS is 1,000 times more precise and accurate than standard gas chromatography and mass spectrometry (GC/MS). This system can analyze 26 drug categories and 180 drug compounds.  The results of this test came back positive for THC (Marijuana). ______

## 2016-01-11 NOTE — Progress Notes (Signed)

## 2016-01-11 NOTE — Progress Notes (Signed)
Quick Note:   Normal Vitamin B-12 levels are between 211 and 946 pg/mL, for our Lab. Medical conditions that can increase levels of vitamin B12 include liver disease, kidney failure and myeloproliferative disorders, which includes myelocytic leukemia and polycythemia vera.  ______ 

## 2016-01-13 ENCOUNTER — Other Ambulatory Visit: Payer: Self-pay | Admitting: Pain Medicine

## 2016-01-13 ENCOUNTER — Other Ambulatory Visit: Payer: Self-pay

## 2016-01-19 ENCOUNTER — Encounter: Payer: Self-pay | Admitting: Pain Medicine

## 2016-01-19 ENCOUNTER — Telehealth: Payer: Self-pay | Admitting: Pain Medicine

## 2016-01-19 NOTE — Telephone Encounter (Signed)
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

## 2016-01-20 NOTE — Telephone Encounter (Signed)
Dr Naveira notified.  

## 2016-01-26 ENCOUNTER — Encounter: Payer: Self-pay | Admitting: Pain Medicine

## 2016-01-26 ENCOUNTER — Ambulatory Visit: Payer: Federal, State, Local not specified - PPO | Attending: Pain Medicine | Admitting: Pain Medicine

## 2016-01-26 VITALS — BP 153/106 | HR 100 | Temp 98.4°F | Resp 16 | Ht 62.0 in | Wt 156.0 lb

## 2016-01-26 DIAGNOSIS — M79606 Pain in leg, unspecified: Secondary | ICD-10-CM | POA: Diagnosis present

## 2016-01-26 DIAGNOSIS — Z981 Arthrodesis status: Secondary | ICD-10-CM | POA: Insufficient documentation

## 2016-01-26 DIAGNOSIS — F209 Schizophrenia, unspecified: Secondary | ICD-10-CM | POA: Insufficient documentation

## 2016-01-26 DIAGNOSIS — K811 Chronic cholecystitis: Secondary | ICD-10-CM | POA: Insufficient documentation

## 2016-01-26 DIAGNOSIS — K529 Noninfective gastroenteritis and colitis, unspecified: Secondary | ICD-10-CM | POA: Diagnosis not present

## 2016-01-26 DIAGNOSIS — K219 Gastro-esophageal reflux disease without esophagitis: Secondary | ICD-10-CM | POA: Insufficient documentation

## 2016-01-26 DIAGNOSIS — M545 Low back pain, unspecified: Secondary | ICD-10-CM

## 2016-01-26 DIAGNOSIS — R197 Diarrhea, unspecified: Secondary | ICD-10-CM | POA: Diagnosis not present

## 2016-01-26 DIAGNOSIS — Z79891 Long term (current) use of opiate analgesic: Secondary | ICD-10-CM | POA: Insufficient documentation

## 2016-01-26 DIAGNOSIS — G47411 Narcolepsy with cataplexy: Secondary | ICD-10-CM | POA: Insufficient documentation

## 2016-01-26 DIAGNOSIS — M549 Dorsalgia, unspecified: Secondary | ICD-10-CM | POA: Diagnosis present

## 2016-01-26 DIAGNOSIS — F419 Anxiety disorder, unspecified: Secondary | ICD-10-CM | POA: Insufficient documentation

## 2016-01-26 DIAGNOSIS — F429 Obsessive-compulsive disorder, unspecified: Secondary | ICD-10-CM | POA: Insufficient documentation

## 2016-01-26 DIAGNOSIS — I10 Essential (primary) hypertension: Secondary | ICD-10-CM | POA: Insufficient documentation

## 2016-01-26 DIAGNOSIS — R112 Nausea with vomiting, unspecified: Secondary | ICD-10-CM | POA: Insufficient documentation

## 2016-01-26 DIAGNOSIS — M961 Postlaminectomy syndrome, not elsewhere classified: Secondary | ICD-10-CM

## 2016-01-26 DIAGNOSIS — F319 Bipolar disorder, unspecified: Secondary | ICD-10-CM | POA: Diagnosis not present

## 2016-01-26 DIAGNOSIS — Z5181 Encounter for therapeutic drug level monitoring: Secondary | ICD-10-CM

## 2016-01-26 DIAGNOSIS — E1143 Type 2 diabetes mellitus with diabetic autonomic (poly)neuropathy: Secondary | ICD-10-CM | POA: Diagnosis not present

## 2016-01-26 DIAGNOSIS — G43909 Migraine, unspecified, not intractable, without status migrainosus: Secondary | ICD-10-CM | POA: Insufficient documentation

## 2016-01-26 DIAGNOSIS — M47816 Spondylosis without myelopathy or radiculopathy, lumbar region: Secondary | ICD-10-CM | POA: Insufficient documentation

## 2016-01-26 DIAGNOSIS — K3184 Gastroparesis: Secondary | ICD-10-CM | POA: Insufficient documentation

## 2016-01-26 DIAGNOSIS — G8929 Other chronic pain: Secondary | ICD-10-CM | POA: Insufficient documentation

## 2016-01-26 DIAGNOSIS — G9619 Other disorders of meninges, not elsewhere classified: Secondary | ICD-10-CM | POA: Diagnosis not present

## 2016-01-26 DIAGNOSIS — G2581 Restless legs syndrome: Secondary | ICD-10-CM | POA: Diagnosis not present

## 2016-01-26 DIAGNOSIS — M792 Neuralgia and neuritis, unspecified: Secondary | ICD-10-CM

## 2016-01-26 DIAGNOSIS — M539 Dorsopathy, unspecified: Secondary | ICD-10-CM

## 2016-01-26 DIAGNOSIS — D72829 Elevated white blood cell count, unspecified: Secondary | ICD-10-CM | POA: Insufficient documentation

## 2016-01-26 DIAGNOSIS — J45909 Unspecified asthma, uncomplicated: Secondary | ICD-10-CM | POA: Diagnosis not present

## 2016-01-26 MED ORDER — GABAPENTIN 300 MG PO CAPS: 300.0000 mg | ORAL_CAPSULE | Freq: Three times a day (TID) | ORAL | Status: AC

## 2016-01-26 MED ORDER — FENTANYL 25 MCG/HR TD PT72: 25.0000 ug | MEDICATED_PATCH | TRANSDERMAL | Status: DC

## 2016-01-26 MED ORDER — TIZANIDINE HCL 4 MG PO TABS: 4.0000 mg | ORAL_TABLET | Freq: Three times a day (TID) | ORAL | Status: DC | PRN

## 2016-01-26 NOTE — Progress Notes (Signed)
Spoke with Dr Nelva Bush staff and informed them that patient would like to transfer care back to his office.  Notified that patient received fentanyl 25 mcg script x 3 today.

## 2016-01-26 NOTE — Progress Notes (Signed)
Safety precautions to be maintained throughout the outpatient stay will include: orient to surroundings, keep bed in low position, maintain call bell within reach at all times, provide assistance with transfer out of bed and ambulation.  

## 2016-01-26 NOTE — Progress Notes (Signed)
Patient's Name: [name]  Patient type: Established  MRN: [id number]  Service setting: Ambulatory outpatient  DOB: 1971/03/04  Location: [facility]  DOS: 01/26/2016  Primary Care Physician: Donnie Coffin, MD  Note by: Kathlen Brunswick. Dossie Arbour, M.D, DABA, [place], [place], [place], [place]  Referring Physician: Alroy Dust, Carlean Jews.Marlou Sa, MD  Specialty: Board-Certified Interventional Pain Management  Last Visit to Pain Management: 01/19/2016   Primary Reason(s) for Visit: Encounter for prescription drug management (Level of risk: moderate) CC: Back Pain and Leg Pain   HPI  [name] is a 45 y.o. year old, female patient, who returns today as an established patient. She has Dyspnea; Narcolepsy with cataplexy; UARS (upper airway resistance syndrome); Chronic cholecystitis; Chronic lower back pain (L>R) (Primary Pain); Medication monitoring encounter; Nausea and vomiting; Nausea vomiting and diarrhea; Abdominal pain; Extrinsic asthma; Leukocytosis; Colitis, acute; Dental infection; Nausea with vomiting; Gastroparesis due to DM ([place]); Enteritis; Chronic pain; Opiate use; Long term prescription opiate use; Long term current use of opiate analgesic; Encounter for therapeutic drug level monitoring; Failed back surgical syndrome (x2); Gastric atony; Epidural fibrosis; Chronic lumbar radicular pain (Bilateral) (L>R); Bilateral lower extremity pain (L>R); Drug tolerance to opioids; Opiate analgesic contract exists; Lumbar spondylosis; Lumbar facet hypertrophy; Grade 1 Retrolisthesis of L1 over L2; Grade 1 Anterolisthesis of L5 over S1; Lumbar facet arthropathy; Other iron deficiency anemias; Nondiabetic gastroparesis; and Neurogenic pain on her problem list.. Her primarily concern today is the Back Pain and Leg Pain  Today I have apologized to the patient for having misread her UDS results and having sent her a letter accusing her of having consumed an illicit substance. Unfortunately, the day damage  to our patient physician relationship is already done and it is clear to me from our conversation today that she no longer trusts me. Because of this reason, I will be stepping away from her care and I will return this back to Dr. Nelva Bush. Today the patient and I spoke about this and we came to the conclusion that this was the best option for both of those. Once again, I did apologize for misreading the results and for having offended her by implying that the source of her THC was not a legal one.  Today I have provided the patient with a 3 months prescriptions on her pain medicine so as to help her transition back to Dr. Nelva Bush care. We have not provided the patient with a return appointment as she has expressed that she is not interested in returning.  Pain Assessment: Self-Reported Pain Score: 8  Reported level is compatible with observation Pain Type: Chronic pain Pain Location: Back Pain Orientation: Lower Pain Descriptors / Indicators: Aching, Throbbing, Shooting Pain Frequency: Constant  The patient comes into the clinics today for pharmacological management of her chronic pain. I last saw this patient on 01/19/2016. The patient  reports that she does not use illicit drugs. Her body mass index is 28.53 kg/(m^2).  Date of Last Visit: 12/15/15 Service Provided on Last Visit: Evaluation  Controlled Substance Pharmacotherapy Assessment & REMS (Risk Evaluation and Mitigation Strategy)  Analgesic: Duragesic 25 mcg/h every 72 hours Pill Count: Please see the nurse's note for latest pill count information. MME/day: 60 mg/day Date of Last Visit: 12/15/15 Pharmacokinetics: Onset of action (Liberation/Absorption): Within expected pharmacological parameters Time to Peak effect (Distribution): Timing and results are as within normal expected parameters Duration of action (Metabolism/Excretion): Within normal limits for medication Pharmacodynamics: Analgesic Effect: More than 50% Activity  Facilitation: Medication(s) allow  patient to sit, stand, walk, and do the basic ADLs Perceived Effectiveness: Described as relatively effective, allowing for increase in activities of daily living (ADL) Side-effects or Adverse reactions: None reported Monitoring: [place] PMP: Online review of the past 30-month period conducted. Compliant with practice rules and regulations UDS Results/interpretation: The patient's last UDS was done on 12/15/2015 and it came back with results that although there were rated as unexpected, further evaluation revealed them to be within normal limits. The patient's THC was present due to the fact that the patient has been getting prescriptions for Marinol for her intractable nausea and vomiting that does not seem to respond to other antiemetics. Once again, the patient was compliant and today I have apologized for having initially having misread the results and having sent her a letter excusing her of getting the [facility] from an illicit source. Medication Assessment Form: Reviewed. Patient indicates being compliant with therapy Treatment compliance: Compliant Risk Assessment: Aberrant Behavior: None observed today Substance Use Disorder (SUD) Risk Level: No change since last visit Risk of opioid abuse or dependence: 0.7-3.0% with doses ? 36 MME/day and 6.1-26% with doses ? 120 MME/day. Opioid Risk Tool (ORT) Score:  1 Low Risk for SUD (Score <3) Depression Scale Score: PHQ-2: PHQ-2 Total Score: 0 No depression (0) PHQ-9: PHQ-9 Total Score: 0 No depression (0-4)  Pharmacologic Plan: No change in therapy, at this time  Laboratory Chemistry  Inflammation Markers Lab Results  Component Value Date   ESRSEDRATE 25* 12/15/2015   CRP 0.6 12/15/2015    Renal Function Lab Results  Component Value Date   BUN 8 12/15/2015   CREATININE 0.54 12/15/2015   GFRAA >60 12/15/2015   GFRNONAA >60 12/15/2015    Hepatic Function Lab Results  Component Value Date   AST 30  12/15/2015   ALT 30 12/15/2015   ALBUMIN 4.2 12/15/2015    Electrolytes Lab Results  Component Value Date   NA 138 12/15/2015   K 3.7 12/15/2015   CL 103 12/15/2015   CALCIUM 9.0 12/15/2015   MG 1.8 12/15/2015    Pain Modulating Vitamins Lab Results  Component Value Date   VD25OH 23.0* 12/15/2015   VITAMINB12 918* 12/15/2015    Coagulation Parameters No results found for: INR, LABPROT   Recent Diagnostic Imaging  No results found.  Meds  The patient has a current medication list which includes the following prescription(s): albuterol, amphetamine-dextroamphetamine, amphetamine-dextroamphetamine, budesonide-formoterol, clobetasol propionate, cyclosporine, diclofenac, dicyclomine, dronabinol, estradiol, fluocinolone acetonide scalp, fluocinonide cream, gabapentin, hydrochlorothiazide, hydrocortisone, hypercare, lidocaine, linzess, lorazepam, loteprednol, lurasidone, metoclopramide, myrbetriq, nitroglycerin, olopatadine hcl, onabotulinumtoxina, pantoprazole, pramipexole, promethazine, promethegan, rectiv, relpax, saccharomyces boulardii, tizanidine, topiramate, triamcinolone cream, verapamil, vitamin d (ergocalciferol), dexlansoprazole, fentanyl, fentanyl, fentanyl, lorazepam, meloxicam, minivelle, nitroglycerin, topiramate, and verapamil.  Current Outpatient Prescriptions on File Prior to Visit  Medication Sig  . albuterol (PROVENTIL HFA;VENTOLIN HFA) 108 (90 BASE) MCG/ACT inhaler Inhale 2 puffs into the lungs every 6 (six) hours as needed for wheezing or shortness of breath.  . amphetamine-dextroamphetamine (ADDERALL XR) 20 MG 24 hr capsule Take 1 capsule (20 mg total) by mouth daily.  Marland Kitchen amphetamine-dextroamphetamine (ADDERALL) 5 MG tablet Take 1 tablet daily at 1PM.  . budesonide-formoterol (SYMBICORT) 160-4.5 MCG/ACT inhaler Inhale 2 puffs into the lungs 2 (two) times daily.    . Clobetasol Propionate (CLOBEX) 0.05 % shampoo Apply 1 application topically every Wednesday.   .  cycloSPORINE (RESTASIS) 0.05 % ophthalmic emulsion Place 1 drop into both eyes every 12 (twelve) hours.    . diclofenac (  VOLTAREN) 50 MG EC tablet Take 1 tablet (50 mg total) by mouth 2 (two) times daily.  Marland Kitchen dicyclomine (BENTYL) 10 MG capsule Take 10 mg by mouth 4 (four) times daily as needed for spasms.  Marland Kitchen dronabinol (MARINOL) 2.5 MG capsule TK 1 C PO BID  . FLUOCINOLONE ACETONIDE SCALP 0.01 % OIL Apply 1 application topically daily.  . fluocinonide (LIDEX) 0.05 % cream Apply 1 application topically 3 (three) times a week.   . hydrochlorothiazide (HYDRODIURIL) 25 MG tablet Take 25 mg by mouth every morning.   . hydrocortisone (PROCTOCORT) 1 % CREA Apply topically.  Marland Kitchen HYPERCARE 20 % external solution Apply 1 application topically daily.   Marland Kitchen lidocaine (XYLOCAINE) 2 % jelly 1 application by Other route as needed (rectally).  Marland Kitchen LINZESS 290 MCG CAPS capsule Reported on 12/15/2015  . LORazepam (ATIVAN) 0.5 MG tablet Take 0.5 mg by mouth every 6 (six) hours as needed for anxiety.  Marland Kitchen loteprednol (LOTEMAX) 0.2 % SUSP Place 1 drop into both eyes daily.  Marland Kitchen lurasidone (LATUDA) 40 MG TABS Take 40 mg by mouth at bedtime. Reported on 08/28/2015  . metoCLOPramide (REGLAN) 5 MG tablet Take 1 tablet (5 mg total) by mouth every 6 (six) hours as needed for nausea.  Marland Kitchen MYRBETRIQ 25 MG TB24 tablet TK 1 T PO QD  . nitroGLYCERIN (NITROGLYN) 2 % ointment Apply 1 inch topically at bedtime.  . Olopatadine HCl (PATADAY) 0.2 % SOLN Place 1 drop into both eyes 2 (two) times daily as needed (for allergies).   . OnabotulinumtoxinA (BOTOX IJ) Inject as directed every 3 (three) months.  . pantoprazole (PROTONIX) 40 MG tablet Take 1 tablet (40 mg total) by mouth daily. (Patient taking differently: Take 40 mg by mouth 2 (two) times daily. )  . pramipexole (MIRAPEX) 1 MG tablet Take 1 mg by mouth at bedtime.  . promethazine (PHENERGAN) 25 MG tablet Take 50 mg by mouth every 6 (six) hours as needed for nausea or vomiting.   Marland Kitchen RELPAX  40 MG tablet Take 40 mg by mouth every 2 (two) hours as needed for migraine or headache.   . saccharomyces boulardii (FLORASTOR) 250 MG capsule Take 1 capsule (250 mg total) by mouth 2 (two) times daily.  Marland Kitchen topiramate (TOPAMAX) 100 MG tablet Take 100-200 mg by mouth 2 (two) times daily. 1 tab every AM and 2 at night  . triamcinolone cream (KENALOG) 0.1 % Apply 1 application topically daily as needed (to affected area).   . verapamil (VERELAN PM) 240 MG 24 hr capsule Take 240 mg by mouth at bedtime.    . Vitamin D, Ergocalciferol, (DRISDOL) 50000 UNITS CAPS capsule Take 50,000 Units by mouth 2 (two) times a week.  Marland Kitchen dexlansoprazole (DEXILANT) 60 MG capsule Take 60 mg by mouth daily. Reported on 01/26/2016  . MINIVELLE 0.1 MG/24HR patch Apply 1 patch topically 2 (two) times a week. Reported on 01/26/2016   No current facility-administered medications on file prior to visit.    ROS  Constitutional: Denies any fever or chills Gastrointestinal: Gastroparesis with intractable nausea and vomiting, or responses to the usual anti-emetics. Patient has been placed on Marinol. Musculoskeletal: Denies any acute onset joint swelling, redness, loss of ROM, or weakness Neurological: No reported episodes of acute onset apraxia, aphasia, dysarthria, agnosia, amnesia, paralysis, loss of coordination, or loss of consciousness  Allergies  Ms. Alpert is allergic to adhesive; aspirin; benadryl; codeine; dust mite extract; mold extract; pollen extract; prednisone; toradol; and zofran.  Nellie  Medical:  Ms. Wideman  has a past medical history of Hypertension; Overactive bladder; Esophageal reflux; Migraine; Psychosis; Schizophrenia, schizo-affective ([place]); OCD (obsessive compulsive disorder); Bipolar 1 disorder ([place]); Restless leg syndrome; S/P lumbar fusion (2011); Torn rotator cuff (it:2012,rt:2013); Migraines; Depression; Anxiety; Narcolepsy; Migraine; Hypersomnia; Asthma; Sleep apnea; Seasonal allergies; Schizophrenia  ([place]); Osteoarthritis; Chronic lower back pain (02/07/2014); Medication monitoring encounter (02/07/2014); Gastroparesis; Allergy; Opiate use (07/02/2015); and Other iron deficiency anemias (08/28/2015). Family: family history includes Cancer in her father, maternal grandmother, mother, paternal aunt, paternal aunt, and paternal grandmother; High Cholesterol in her father; Hypertension in her father; Thyroid disease in her father and sister. There is no history of Narcolepsy. Surgical:  has past surgical history that includes Vesicovaginal fistula closure w/ TAH (2001); Tubal ligation (2004); Shoulder surgery; Abdominal hysterectomy (2001); spurs,ruptured disc bone (2009); Lumbar fusion (2011); Rotator cuff repair [id number]); Oophorectomy ( 2004); Ankle Fusion; Cholecystectomy (N/A, 07/20/2013); Colonoscopy with propofol (N/A, 10/02/2013); Esophagogastroduodenoscopy (egd) with propofol (N/A, 10/02/2013); Wisdom tooth extraction; Laparoscopic salpingo oophorectomy (Left, 12/05/2013); Flexible sigmoidoscopy (Left, 04/07/2015); Fracture surgery; and Spine surgery. Tobacco:  reports that she has never smoked. She has never used smokeless tobacco. Alcohol:  reports that she does not drink alcohol. Drug:  reports that she does not use illicit drugs.  Constitutional Exam  Vitals: Blood pressure 153/106, pulse 100, temperature 98.4 F (36.9 C), resp. rate 16, height 5\' 2"  (1.575 m), weight 156 lb (70.761 kg), SpO2 100 %. General appearance: Well nourished, well developed, and well hydrated. In no acute distress Calculated BMI/Body habitus: Body mass index is 28.53 kg/(m^2). (25-29.9 kg/m2) Overweight - 20% higher incidence of chronic pain Psych/Mental status: Alert and oriented x 3 (person, place, & time) Eyes: PERLA Respiratory: No evidence of acute respiratory distress  Gait & Posture Assessment  Ambulation: Unassisted Gait: Unaffected Posture: WNL  Lower Extremity Exam    Side: Right lower extremity  Side:  Left lower extremity  Inspection: No masses, redness, swelling, or asymmetry ROM:  Inspection: No masses, redness, swelling, or asymmetry ROM:  Functional: ROM is within functional limits [facility])  Functional: ROM is within functional limits [facility])  Muscle strength & Tone: Functionally intact  Muscle strength & Tone: Functionally intact  Sensory: Unimpaired  Sensory: Unimpaired   Assessment & Plan  Primary Diagnosis & Pertinent Problem List: The primary encounter diagnosis was Chronic pain. Diagnoses of Encounter for therapeutic drug level monitoring, Long term current use of opiate analgesic, Failed back surgical syndrome (x2), Chronic lower back pain (L>R) (Primary Pain), and Neurogenic pain were also pertinent to this visit.  Visit Diagnosis: 1. Chronic pain   2. Encounter for therapeutic drug level monitoring   3. Long term current use of opiate analgesic   4. Failed back surgical syndrome (x2)   5. Chronic lower back pain (L>R) (Primary Pain)   6. Neurogenic pain     Problems updated and reviewed during this visit: Problem  Neurogenic Pain    Problem-specific Plan(s): No problem-specific assessment & plan notes found for this encounter.  No new assessment & plan notes have been filed under this hospital service since the last note was generated. Service: Pain Management   Plan of Care   Problem List Items Addressed This Visit      High   Chronic lower back pain (L>R) (Primary Pain) (Chronic)   Relevant Medications   meloxicam (MOBIC) 15 MG tablet   fentaNYL (DURAGESIC - DOSED MCG/HR) 25 MCG/HR patch   fentaNYL (DURAGESIC - DOSED MCG/HR) 25 MCG/HR patch   fentaNYL (  DURAGESIC - DOSED MCG/HR) 25 MCG/HR patch   tiZANidine (ZANAFLEX) 4 MG tablet   Chronic pain - Primary (Chronic)   Relevant Medications   topiramate (TOPAMAX) 100 MG tablet   meloxicam (MOBIC) 15 MG tablet   fentaNYL (DURAGESIC - DOSED MCG/HR) 25 MCG/HR patch   fentaNYL (DURAGESIC - DOSED MCG/HR) 25 MCG/HR  patch   fentaNYL (DURAGESIC - DOSED MCG/HR) 25 MCG/HR patch   gabapentin (NEURONTIN) 300 MG capsule   tiZANidine (ZANAFLEX) 4 MG tablet   Failed back surgical syndrome (x2) (Chronic)   Relevant Medications   meloxicam (MOBIC) 15 MG tablet   fentaNYL (DURAGESIC - DOSED MCG/HR) 25 MCG/HR patch   fentaNYL (DURAGESIC - DOSED MCG/HR) 25 MCG/HR patch   fentaNYL (DURAGESIC - DOSED MCG/HR) 25 MCG/HR patch   tiZANidine (ZANAFLEX) 4 MG tablet   Neurogenic pain (Chronic)   Relevant Medications   gabapentin (NEURONTIN) 300 MG capsule     Medium   Encounter for therapeutic drug level monitoring   Long term current use of opiate analgesic (Chronic)       Pharmacotherapy (Medications Ordered): Meds ordered this encounter  Medications  . fentaNYL (DURAGESIC - DOSED MCG/HR) 25 MCG/HR patch    Sig: Place 1 patch (25 mcg total) onto the skin every 3 (three) days.    Dispense:  10 patch    Refill:  0    Do not place this medication, or any other prescription from our practice, on "Automatic Refill". Patient may have prescription filled one day early if pharmacy is closed on scheduled refill date. Do not fill until: 01/26/16 To last until: 02/25/16  . fentaNYL (DURAGESIC - DOSED MCG/HR) 25 MCG/HR patch    Sig: Place 1 patch (25 mcg total) onto the skin every 3 (three) days.    Dispense:  10 patch    Refill:  0    Do not place this medication, or any other prescription from our practice, on "Automatic Refill". Patient may have prescription filled one day early if pharmacy is closed on scheduled refill date. Do not fill until: 02/25/16 To last until: 03/26/16  . fentaNYL (DURAGESIC - DOSED MCG/HR) 25 MCG/HR patch    Sig: Place 1 patch (25 mcg total) onto the skin every 3 (three) days.    Dispense:  10 patch    Refill:  0    Do not place this medication, or any other prescription from our practice, on "Automatic Refill". Patient may have prescription filled one day early if pharmacy is closed on  scheduled refill date. Do not fill until: 03/26/16 To last until: 04/25/16  . gabapentin (NEURONTIN) 300 MG capsule    Sig: Take 1 capsule (300 mg total) by mouth 3 (three) times daily.    Dispense:  90 capsule    Refill:  2    Do not place this medication, or any other prescription from our practice, on "Automatic Refill". Patient may have prescription filled one day early if pharmacy is closed on scheduled refill date.  Marland Kitchen tiZANidine (ZANAFLEX) 4 MG tablet    Sig: Take 1 tablet (4 mg total) by mouth every 8 (eight) hours as needed for muscle spasms.    Dispense:  90 tablet    Refill:  2    Do not place this medication, or any other prescription from our practice, on "Automatic Refill". Patient may have prescription filled one day early if pharmacy is closed on scheduled refill date.    Lab-work & Procedure Ordered: No orders of the  defined types were placed in this encounter.    Imaging Ordered: None  Interventional Therapies: Scheduled:  None.   Considering:  None.   PRN Procedures:  None.   Referral(s) or Consult(s): None at this time.  New Prescriptions   FENTANYL (DURAGESIC - DOSED MCG/HR) 25 MCG/HR PATCH    Place 1 patch (25 mcg total) onto the skin every 3 (three) days.   FENTANYL (DURAGESIC - DOSED MCG/HR) 25 MCG/HR PATCH    Place 1 patch (25 mcg total) onto the skin every 3 (three) days.   FENTANYL (DURAGESIC - DOSED MCG/HR) 25 MCG/HR PATCH    Place 1 patch (25 mcg total) onto the skin every 3 (three) days.    Medications administered during this visit: Ms. Magistro had no medications administered during this visit.  Requested PM Follow-up: No Follow-up on file.  Future Appointments Date Time Provider [place]  02/23/2016 2:30 PM Ward Givens, NP [facility] None    Primary Care Physician: Donnie Coffin, MD Location: [facility] Note by: Kathlen Brunswick. Dossie Arbour, M.D, DABA, DABAPM, DABPM, DABIPP, FIPP  Pain Score Disclaimer: We  use the NRS-11 scale. This is a self-reported, subjective measurement of pain severity with only modest accuracy. It is used primarily to identify changes within a particular patient. It must be understood that outpatient pain scales are significantly less accurate that those used for research, where they can be applied under ideal controlled circumstances with minimal exposure to variables. In reality, the score is likely to be a combination of pain intensity and pain affect, where pain affect describes the degree of emotional arousal or changes in action readiness caused by the sensory experience of pain. Factors such as social and work situation, setting, emotional state, anxiety levels, expectation, and prior pain experience may influence pain perception and show large inter-individual differences that may also be affected by time variables.  Patient instructions provided during this appointment: There are no Patient Instructions on file for this visit.

## 2016-01-28 DIAGNOSIS — K5901 Slow transit constipation: Secondary | ICD-10-CM | POA: Diagnosis not present

## 2016-01-29 ENCOUNTER — Other Ambulatory Visit: Payer: Self-pay | Admitting: Neurology

## 2016-01-29 NOTE — Telephone Encounter (Signed)
Patient requesting refill of amphetamine-dextroamphetamine (ADDERALL XR) 20 MG 24 hr capsule , amphetamine-dextroamphetamine (ADDERALL) 5 MG tablet Pharmacy: pick up

## 2016-01-30 ENCOUNTER — Other Ambulatory Visit: Payer: Self-pay | Admitting: Neurology

## 2016-01-30 DIAGNOSIS — G47419 Narcolepsy without cataplexy: Secondary | ICD-10-CM

## 2016-01-30 DIAGNOSIS — K3184 Gastroparesis: Secondary | ICD-10-CM

## 2016-01-30 MED ORDER — AMPHETAMINE-DEXTROAMPHETAMINE 5 MG PO TABS: ORAL_TABLET | ORAL | Status: DC

## 2016-01-30 MED ORDER — AMPHETAMINE-DEXTROAMPHET ER 20 MG PO CP24: 20.0000 mg | ORAL_CAPSULE | Freq: Every day | ORAL | Status: DC

## 2016-01-30 NOTE — Telephone Encounter (Signed)
Refill x 1 for 30 days done

## 2016-02-03 ENCOUNTER — Other Ambulatory Visit: Payer: Self-pay

## 2016-02-03 DIAGNOSIS — K3184 Gastroparesis: Secondary | ICD-10-CM

## 2016-02-03 DIAGNOSIS — G47419 Narcolepsy without cataplexy: Secondary | ICD-10-CM

## 2016-02-03 MED ORDER — AMPHETAMINE-DEXTROAMPHET ER 20 MG PO CP24: 20.0000 mg | ORAL_CAPSULE | Freq: Every day | ORAL | Status: DC

## 2016-02-03 MED ORDER — AMPHETAMINE-DEXTROAMPHETAMINE 5 MG PO TABS: ORAL_TABLET | ORAL | Status: DC

## 2016-02-03 NOTE — Telephone Encounter (Signed)
Called pt to advise her that her RX for adderall is ready for pick up at the front desk. No answer, left a message for the pt to call me back. If pt calls back, please advise her of this information and give clinic hours.

## 2016-02-03 NOTE — Telephone Encounter (Signed)
Dr. Leonie Man did not sign the adderall prescriptions that he printed out on 01/30/16 and he is now out of the office for a week and unable to sign. Will route to Dr. Brett Fairy to print off adderall RXs and sign.

## 2016-02-03 NOTE — Telephone Encounter (Signed)
Dr. Leonie Man did not sign the adderall prescriptions that he printed out on 01/30/16 and he is now out of the office for a week and unable to sign. Will route to Dr. Brett Fairy to print off adderall RXs and sign.  Dr. Clydene Fake RXs for adderall that he printed will be shredded.

## 2016-02-23 ENCOUNTER — Encounter: Payer: Self-pay | Admitting: Adult Health

## 2016-02-23 ENCOUNTER — Ambulatory Visit (INDEPENDENT_AMBULATORY_CARE_PROVIDER_SITE_OTHER): Payer: Federal, State, Local not specified - PPO | Admitting: Adult Health

## 2016-02-23 VITALS — BP 114/70 | HR 85 | Ht 62.0 in | Wt 157.0 lb

## 2016-02-23 DIAGNOSIS — G47419 Narcolepsy without cataplexy: Secondary | ICD-10-CM

## 2016-02-23 DIAGNOSIS — G4733 Obstructive sleep apnea (adult) (pediatric): Secondary | ICD-10-CM | POA: Diagnosis not present

## 2016-02-23 DIAGNOSIS — Z9989 Dependence on other enabling machines and devices: Secondary | ICD-10-CM

## 2016-02-23 NOTE — Patient Instructions (Signed)
Continue adderall 20 mg in the AM and 10 mg at 1PM if needed Make sure CPAP card is securely in machine If your symptoms worsen or you develop new symptoms please let us know.

## 2016-02-23 NOTE — Progress Notes (Signed)
PATIENT: [name] DOB: August 30, 1971  REASON FOR VISIT: follow up- narcolepsy, sleep apnea HISTORY FROM: patient  HISTORY OF PRESENT ILLNESS: Today 02/23/2016 :Ms. Kleiner is a 45 year old female with a history of narcolepsy and sleep apnea. She returns today for follow-up. At the last visit her Adderall was adjusted. She is taking Adderall extended release 20 mg in the morning and Adderall immediate release 10 mg at lunchtime if needed. She reports that this has been helpful although she still gets sleepy later in the afternoons. The patient did bring her card with her today however she states that when she removed the card from her device she noticed that it was not pressed and all the way. We are unable to obtain a download today. It appears that the patient has not had a download since 2013. The patient's Epworth sleepiness score is 14 and fatigue severity score is 56. She returns today for evaluation.  HISTORY 02/26/15: Ms. Do is a 45 year old female with a history of narcolepsy. She returns today for follow-up. She is currently taking Adderall XR 20 mg 1 capsule daily. She reports that this works well initially. However by midday tends to wear off. She states that she takes her medication at 7:30 AM and by lunchtime she is starting to feel tired again. Patient states she goes to bed around 9:30 PM. She states that it typically takes her a while to fall asleep. She also has trouble staying asleep. The patient is on CPAP however there is been no download since 2014. I have asked the patient to bring in her machine and card for [place] to do a download. Her Epworth sleepiness score is 14 was previously 18 and fatigue severity score is 62 was previously 63. She returns today for an evaluation   HISTORY 08/07/14: Ms. Bankey is a 45 year old female with a history of narcolepsy. She returns today for follow-up. She was prescribed Adderall 20 mg BID. She was taken off Xyrem because she was placed on  fentanyl patches for pain control. She reports that her daytime sleepiness has not improved with Adderall. She has been only taking 1 Adderall 20 mg around mid day ( she thought that was how it was ordered). She states that the Adderall helps for several hours after taking it but then it wears off. She states that she is spending more time in the bed rather than up and doing things. She is also not getting restorative sleep which she feels is affecting her fatigue level. Her Epworth is 18 Fatigue severity score is 63.   HISTORY 02/07/14 [facility]): Mrs Litchfield just underwent a gallbladder surgery november 2014 under Dr Rush Farmer after losing 20 pounds with constant nausea, indigestion, abdominal cramping. Meanwhile there was another exploratory surgery April 12th 2015 and was diagnosed with a cyst on the ovary and fallopian tube. The ovary,her last remaining was removed. She has gastroparesis. Dr. Michail Sermon placed her on Reglan and Linsette. She was referred to [facility] for September 2015 She was placed on fentanyl patches for pain control. She started these after she started [facility] and now reports taking the fentanyl every 2 days instead of every 3 days. This is contraindicated for XYREM users. [name] is here for her regular 6 month revisit for her Xyrem therapy .She has responded very well she is much less excessive daytime sleepy she has much less frequent narcoleptic and cataplectic attacks. As I have described in my past visit note , her MSLT documented a  pathological degree of hypersomnia but was lacking the REM onset naps, which would diagnose narcolepsy. 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 Her response to Xyrem also was extremely preferable.After her last visit I had wanted the patient to be tested by the HDL - narcolepsy factorEpworth 15 on meds, 20 without XYREM and FSS 63 points. Based on her narcotic pain therapy needs, I doubt that her pain  management physician sees any alternative to the current regimen.I will not refill her Xyrem she takes 9 g daily divided into doses at night. I will change her to Adderall 20 mg bid po. I will recheck her liver function tests. She was just discharged from the hospital; 6 weeks ago - RV in 6 month with NP Hassell Done for adderall refill, check up- Epworth and FSS, weight , and LFT. Cc [employer]   REVIEW OF SYSTEMS: Out of a complete 14 system review of symptoms, the patient complains only of the following symptoms, and all other reviewed systems are negative.  Activity change, appetite change, fatigue, unexpected weight change, excessive sweating, ringing in ears, runny nose, drooling, eye redness, light sensitivity, cold intolerance, heat intolerance, swollen abdomen, abdominal pain, rectal bleeding, constipation, diarrhea, nausea, vomiting, incontinence of bowels, insomnia, apnea, frequent waking, daytime sleepiness, snoring, neck stiffness, muscle cramps, muscle aches, back pain, joint swelling, joint pain, urgency, frequency of urination, incontinence of bladder, dizziness, headache, numbness, weakness, agitation, decreased concentration, depression, nervous/anxious, environmental allergies, food allergies  ALLERGIES: Allergies  Allergen Reactions  . Adhesive [Tape] Rash    Glue from steri strips  . Aspirin Nausea Only  . Benadryl [Diphenhydramine Hcl] Other (See Comments)    Makes her hyper, jittery  . Codeine Nausea And Vomiting  . Dust Mite Extract Other (See Comments)    Cough, sneeze, eyes runny  . Mold Extract [Trichophyton] Other (See Comments)    Cough, sneeze, eyes/nose runny  . Pollen Extract Other (See Comments)    Cough, sneeze, eyes/nose runny  . Prednisone Other (See Comments)    Makes her hyper, jittery   . Toradol [Ketorolac Tromethamine] Anxiety  . Zofran [Ondansetron] Nausea And Vomiting    HOME MEDICATIONS: Outpatient Prescriptions Prior to Visit  Medication  Sig Dispense Refill  . albuterol (PROVENTIL HFA;VENTOLIN HFA) 108 (90 BASE) MCG/ACT inhaler Inhale 2 puffs into the lungs every 6 (six) hours as needed for wheezing or shortness of breath.    . amphetamine-dextroamphetamine (ADDERALL XR) 20 MG 24 hr capsule Take 1 capsule (20 mg total) by mouth daily. 30 capsule 0  . amphetamine-dextroamphetamine (ADDERALL) 5 MG tablet Take 1 tablet daily at 1PM. 30 tablet 0  . budesonide-formoterol (SYMBICORT) 160-4.5 MCG/ACT inhaler Inhale 2 puffs into the lungs 2 (two) times daily.      . Clobetasol Propionate (CLOBEX) 0.05 % shampoo Apply 1 application topically every Wednesday.     . cycloSPORINE (RESTASIS) 0.05 % ophthalmic emulsion Place 1 drop into both eyes every 12 (twelve) hours.      . diclofenac (VOLTAREN) 50 MG EC tablet Take 1 tablet (50 mg total) by mouth 2 (two) times daily. 60 tablet 0  . dicyclomine (BENTYL) 10 MG capsule Take 10 mg by mouth 4 (four) times daily as needed for spasms.    Marland Kitchen dronabinol (MARINOL) 2.5 MG capsule TK 1 C PO BID  3  . estradiol (ESTRACE) 1 MG tablet TK 1 T PO D  3  . FLUOCINOLONE ACETONIDE SCALP 0.01 % OIL Apply  1 application topically daily.  0  . fluocinonide (LIDEX) 0.05 % cream Apply 1 application topically 3 (three) times a week.     . gabapentin (NEURONTIN) 300 MG capsule Take 1 capsule (300 mg total) by mouth 3 (three) times daily. 90 capsule 2  . hydrochlorothiazide (HYDRODIURIL) 25 MG tablet Take 25 mg by mouth every morning.     . hydrocortisone (PROCTOCORT) 1 % CREA Apply topically.    Marland Kitchen HYPERCARE 20 % external solution Apply 1 application topically daily.     Marland Kitchen lidocaine (XYLOCAINE) 2 % jelly 1 application by Other route as needed (rectally).    Marland Kitchen LINZESS 290 MCG CAPS capsule Reported on 12/15/2015  3  . LORazepam (ATIVAN) 0.5 MG tablet Take 0.5 mg by mouth every 6 (six) hours as needed for anxiety.    Marland Kitchen loteprednol (LOTEMAX) 0.2 % SUSP Place 1 drop into both eyes daily.    Marland Kitchen lurasidone (LATUDA) 40 MG TABS  Take 40 mg by mouth at bedtime. Reported on 08/28/2015    . metoCLOPramide (REGLAN) 5 MG tablet Take 1 tablet (5 mg total) by mouth every 6 (six) hours as needed for nausea. 30 tablet 0  . MYRBETRIQ 25 MG TB24 tablet TK 1 T PO QD  3  . nitroGLYCERIN (NITROGLYN) 2 % ointment Apply 1 inch topically at bedtime.    . Olopatadine HCl (PATADAY) 0.2 % SOLN Place 1 drop into both eyes 2 (two) times daily as needed (for allergies).     . OnabotulinumtoxinA (BOTOX IJ) Inject as directed every 3 (three) months.    . pantoprazole (PROTONIX) 40 MG tablet Take 1 tablet (40 mg total) by mouth daily. (Patient taking differently: Take 40 mg by mouth 2 (two) times daily. ) 30 tablet 0  . pramipexole (MIRAPEX) 1 MG tablet Take 1 mg by mouth at bedtime.  0  . promethazine (PHENERGAN) 25 MG tablet Take 50 mg by mouth every 6 (six) hours as needed for nausea or vomiting.   3  . PROMETHEGAN 25 MG suppository PLACE 1 SUPPOSITORY RECTALLY Q 6 H PRN N FOR UP TO 7 DAYS  11  . RECTIV 0.4 % OINT APPLY PEA-SIZED AMOUNT TO RECTAL AREA NIGHTLY FOR 14 DAYS FOR FISSURE TREATMENT.  0  . RELPAX 40 MG tablet Take 40 mg by mouth every 2 (two) hours as needed for migraine or headache.     . saccharomyces boulardii (FLORASTOR) 250 MG capsule Take 1 capsule (250 mg total) by mouth 2 (two) times daily. 60 capsule 1  . tiZANidine (ZANAFLEX) 4 MG tablet Take 1 tablet (4 mg total) by mouth every 8 (eight) hours as needed for muscle spasms. 90 tablet 2  . topiramate (TOPAMAX) 100 MG tablet Take 100-200 mg by mouth 2 (two) times daily. 1 tab every AM and 2 at night    . triamcinolone cream (KENALOG) 0.1 % Apply 1 application topically daily as needed (to affected area).     . verapamil (VERELAN PM) 240 MG 24 hr capsule Take 240 mg by mouth at bedtime.      . Vitamin D, Ergocalciferol, (DRISDOL) 50000 UNITS CAPS capsule Take 50,000 Units by mouth 2 (two) times a week.    Marland Kitchen dexlansoprazole (DEXILANT) 60 MG capsule Take 60 mg by mouth daily.  Reported on 02/23/2016    . fentaNYL (DURAGESIC - DOSED MCG/HR) 25 MCG/HR patch Place 1 patch (25 mcg total) onto the skin every 3 (three) days. (Patient not taking: Reported on 02/23/2016) 10 patch  0  . fentaNYL (DURAGESIC - DOSED MCG/HR) 25 MCG/HR patch Place 1 patch (25 mcg total) onto the skin every 3 (three) days. (Patient not taking: Reported on 02/23/2016) 10 patch 0  . fentaNYL (DURAGESIC - DOSED MCG/HR) 25 MCG/HR patch Place 1 patch (25 mcg total) onto the skin every 3 (three) days. (Patient not taking: Reported on 02/23/2016) 10 patch 0  . LORazepam (ATIVAN) 0.5 MG tablet Reported on 01/26/2016    . meloxicam (MOBIC) 15 MG tablet Reported on 01/26/2016  0  . MINIVELLE 0.1 MG/24HR patch Apply 1 patch topically 2 (two) times a week. Reported on 01/26/2016  12  . Nitroglycerin (RECTIV) 0.4 % OINT Reported on 01/26/2016    . topiramate (TOPAMAX) 100 MG tablet Reported on 01/26/2016    . verapamil (VERELAN PM) 240 MG 24 hr capsule Reported on 01/26/2016     No facility-administered medications prior to visit.    PAST MEDICAL HISTORY: Past Medical History  Diagnosis Date  . Hypertension   . Overactive bladder   . Esophageal reflux   . Migraine   . Psychosis   . Schizophrenia, schizo-affective ([place])   . OCD (obsessive compulsive disorder)   . Bipolar 1 disorder ([place])   . Restless leg syndrome   . S/P lumbar fusion 2011  . Torn rotator cuff it:2012,rt:2013  . Migraines   . Depression     excessive daytime sleepiness-MSLT confirmed pathological degree of hypersomnia-12/13  . Anxiety   . Narcolepsy   . Migraine   . Hypersomnia   . Asthma   . Sleep apnea     uses CPAP every night  . Seasonal allergies   . Schizophrenia ([place])   . Osteoarthritis     knees, hands, shoulder, lower back  . Chronic lower back pain 02/07/2014  . Medication monitoring encounter 02/07/2014  . Gastroparesis   . Allergy   . Opiate use 07/02/2015  . Other iron deficiency anemias 08/28/2015    PAST SURGICAL  HISTORY: Past Surgical History  Procedure Laterality Date  . Vesicovaginal fistula closure w/ tah  2001  . Tubal ligation  2004  . Shoulder surgery      left and right , bone spurs, torn muscle  . Abdominal hysterectomy  2001  . Spurs,ruptured disc bone  2009  . Lumbar fusion  2011    L4-L5  . Rotator cuff repair  2012,2013    torn   . Oophorectomy   2004    right  . Ankle fusion      right  . Cholecystectomy N/A 07/20/2013    Procedure: LAPAROSCOPIC CHOLECYSTECTOMY;  Surgeon: Harl Bowie, MD;  Location: [facility];  Service: General;  Laterality: N/A;  . Colonoscopy with propofol N/A 10/02/2013    Procedure: COLONOSCOPY WITH PROPOFOL;  Surgeon: Garlan Fair, MD;  Location: [facility];  Service: Endoscopy;  Laterality: N/A;  . Esophagogastroduodenoscopy (egd) with propofol N/A 10/02/2013    Procedure: ESOPHAGOGASTRODUODENOSCOPY (EGD) WITH PROPOFOL;  Surgeon: Garlan Fair, MD;  Location: [facility];  Service: Endoscopy;  Laterality: N/A;  . Wisdom tooth extraction    . Laparoscopic salpingo oopherectomy Left 12/05/2013    Procedure: LAPAROSCOPIC SALPINGO OOPHORECTOMY;  Surgeon: Thurnell Lose, MD;  Location: [facility];  Service: Gynecology;  Laterality: Left;  . Flexible sigmoidoscopy Left 04/07/2015    Procedure: FLEXIBLE SIGMOIDOSCOPY;  Surgeon: Ronald Lobo, MD;  Location: [facility]COPY;  Service: Endoscopy;  Laterality: Left;  . Fracture surgery    . Spine surgery  FAMILY HISTORY: Family History  Problem Relation Age of Onset  . Cancer Mother     breast  . Cancer Father     prostate,colon  . Hypertension Father   . Thyroid disease Father   . High Cholesterol Father   . Thyroid disease Sister   . Cancer Paternal Aunt     colon  . Cancer Maternal Grandmother     kidney  . Cancer Paternal Grandmother     colon  . Cancer Paternal Aunt     lung  . Narcolepsy Neg Hx     SOCIAL HISTORY: Social History   Social History  . Marital Status: Married     Spouse Name: Elberta Fortis  . Number of Children: 3  . Years of Education: Master's   Occupational History  . unemployed   .     Social History Main Topics  . Smoking status: Never Smoker   . Smokeless tobacco: Never Used  . Alcohol Use: No  . Drug Use: No  . Sexual Activity: Yes    Birth Control/ Protection: Surgical   Other Topics Concern  . Not on file   Social History Narrative    This patient has an RDI of 12 and AHI of 4.8,  titrated to 6 cm water after PSG, she did qualify for MSLT, Epworth 22 today on 6 cm water.      Based on her MSLT  of 3.6 minutes without SREM and Epworth of 22 on CPAP with residual AHI of 1.8, she should be treated  in a trial base for narcolepsy. She is on many REM supressant medications, and may not be able to  express the narcolepsy in an MSLT.   Patient is married Elberta Fortis) and lives at home with her husband and three children.   Patient is currently unemployed.   Patient has a Master's degree.   Patient is right-handed.   Patient drinks two cups of coffee on average but none lately for 6 months, 3 cups of tea daily.      PHYSICAL EXAM  Filed Vitals:   02/23/16 1425  BP: 114/70  Pulse: 85  Height: 5\' 2"  (1.575 m)  Weight: 157 lb (71.215 kg)   Body mass index is 28.71 kg/(m^2).  Generalized: Well developed, in no acute distress  Neck: Circumference 15 inches, Mallampati 3+  Neurological examination  Mentation: Alert oriented to time, place, history taking. Follows all commands speech and language fluent Cranial nerve II-XII: Pupils were equal round reactive to light. Extraocular movements were full, visual field were full on confrontational test. Facial sensation and strength were normal. Uvula tongue midline. Head turning and shoulder shrug  were normal and symmetric. Motor: The motor testing reveals 5 over 5 strength of all 4 extremities. Good symmetric motor tone is noted throughout.  Sensory: Sensory testing is intact to soft touch on  all 4 extremities. No evidence of extinction is noted.  Coordination: Cerebellar testing reveals good finger-nose-finger and heel-to-shin bilaterally.  Gait and station: Gait is normal.  Reflexes: Deep tendon reflexes are symmetric and normal bilaterally.   DIAGNOSTIC DATA (LABS, IMAGING, TESTING) - I reviewed patient records, labs, notes, testing and imaging myself where available.  Lab Results  Component Value Date   WBC 5.7 08/28/2015   HGB 10.9* 04/09/2015   HCT 37.3 08/28/2015   MCV 80 08/28/2015   PLT 235 08/28/2015      Component Value Date/Time   NA 138 12/15/2015 1043   NA 143 08/28/2015 1557  K 3.7 12/15/2015 1043   CL 103 12/15/2015 1043   CO2 30 12/15/2015 1043   GLUCOSE 85 12/15/2015 1043   GLUCOSE 83 08/28/2015 1557   BUN 8 12/15/2015 1043   BUN 7 08/28/2015 1557   CREATININE 0.54 12/15/2015 1043   CALCIUM 9.0 12/15/2015 1043   PROT 7.7 12/15/2015 1043   PROT 7.1 08/28/2015 1557   ALBUMIN 4.2 12/15/2015 1043   ALBUMIN 4.3 08/28/2015 1557   AST 30 12/15/2015 1043   ALT 30 12/15/2015 1043   ALKPHOS 55 12/15/2015 1043   BILITOT 0.4 12/15/2015 1043   BILITOT 0.2 08/28/2015 1557   GFRNONAA >60 12/15/2015 1043   GFRAA >60 12/15/2015 1043     Lab Results  Component Value Date   VITAMINB12 918* 12/15/2015       ASSESSMENT AND PLAN 45 y.o. year old female  has a past medical history of Hypertension; Overactive bladder; Esophageal reflux; Migraine; Psychosis; Schizophrenia, schizo-affective ([place]); OCD (obsessive compulsive disorder); Bipolar 1 disorder ([place]); Restless leg syndrome; S/P lumbar fusion (2011); Torn rotator cuff (it:2012,rt:2013); Migraines; Depression; Anxiety; Narcolepsy; Migraine; Hypersomnia; Asthma; Sleep apnea; Seasonal allergies; Schizophrenia ([place]); Osteoarthritis; Chronic lower back pain (02/07/2014); Medication monitoring encounter (02/07/2014); Gastroparesis; Allergy; Opiate use (07/02/2015); and Other iron deficiency anemias (08/28/2015).  here with:  1. Narcolepsy 2. Shortness sleep apnea on CPAP  The patient will continue on Adderall extended release 20 mg daily and Adderall immediate release 10 mg at 1 PM if needed. Advised the patient that her daytime sleepiness could be a result of sleep apnea. We have been unable to obtain a download since 2013. Advised the patient that she should ensure that her memory card is in her machine and we will check a download at the next visit. Patient advised that if her symptoms worsen or she develops any new symptoms she should let us know. She will follow-up in 3 months or sooner if needed.    Ward Givens, MSN, NP-C 02/23/2016, 2:46 PM [facility] [address] [place], [place] [address] [phone]

## 2016-02-23 NOTE — Progress Notes (Signed)
I agree with the assessment and plan as directed by NP .The patient is known to me .   Delphia Kaylor, MD  

## 2016-02-26 ENCOUNTER — Ambulatory Visit: Payer: Federal, State, Local not specified - PPO | Admitting: Adult Health

## 2016-02-26 NOTE — Telephone Encounter (Signed)

## 2016-02-28 DIAGNOSIS — M5012 Mid-cervical disc disorder, unspecified level: Secondary | ICD-10-CM | POA: Diagnosis not present

## 2016-02-28 DIAGNOSIS — M50122 Cervical disc disorder at C5-C6 level with radiculopathy: Secondary | ICD-10-CM | POA: Diagnosis not present

## 2016-02-28 DIAGNOSIS — M50123 Cervical disc disorder at C6-C7 level with radiculopathy: Secondary | ICD-10-CM | POA: Diagnosis not present

## 2016-03-05 DIAGNOSIS — E8940 Asymptomatic postprocedural ovarian failure: Secondary | ICD-10-CM | POA: Diagnosis not present

## 2016-03-05 DIAGNOSIS — R5382 Chronic fatigue, unspecified: Secondary | ICD-10-CM | POA: Diagnosis not present

## 2016-03-05 DIAGNOSIS — E559 Vitamin D deficiency, unspecified: Secondary | ICD-10-CM | POA: Diagnosis not present

## 2016-03-10 ENCOUNTER — Telehealth: Payer: Self-pay | Admitting: Neurology

## 2016-03-10 ENCOUNTER — Other Ambulatory Visit: Payer: Self-pay

## 2016-03-10 DIAGNOSIS — K3184 Gastroparesis: Secondary | ICD-10-CM

## 2016-03-10 DIAGNOSIS — G47419 Narcolepsy without cataplexy: Secondary | ICD-10-CM

## 2016-03-10 MED ORDER — AMPHETAMINE-DEXTROAMPHET ER 20 MG PO CP24: 20.0000 mg | ORAL_CAPSULE | Freq: Every day | ORAL | Status: DC

## 2016-03-10 MED ORDER — AMPHETAMINE-DEXTROAMPHETAMINE 5 MG PO TABS: ORAL_TABLET | ORAL | Status: DC

## 2016-03-10 NOTE — Telephone Encounter (Signed)
LFT VM for patient that prescription for addreall is ready for pick up.

## 2016-03-10 NOTE — Telephone Encounter (Signed)
Patient requesting refill of amphetamine-dextroamphetamine (ADDERALL XR) 20 MG 24 hr capsule and  Adderall 10mg . I advised the Rx's will be ready in 24 hours unless the nurse advises.

## 2016-03-29 DIAGNOSIS — A084 Viral intestinal infection, unspecified: Secondary | ICD-10-CM | POA: Diagnosis not present

## 2016-03-30 DIAGNOSIS — G8929 Other chronic pain: Secondary | ICD-10-CM | POA: Diagnosis not present

## 2016-03-30 DIAGNOSIS — G43009 Migraine without aura, not intractable, without status migrainosus: Secondary | ICD-10-CM | POA: Diagnosis not present

## 2016-03-30 DIAGNOSIS — G2581 Restless legs syndrome: Secondary | ICD-10-CM | POA: Diagnosis not present

## 2016-03-30 DIAGNOSIS — M545 Low back pain: Secondary | ICD-10-CM | POA: Diagnosis not present

## 2016-04-22 DIAGNOSIS — M5136 Other intervertebral disc degeneration, lumbar region: Secondary | ICD-10-CM | POA: Diagnosis not present

## 2016-04-22 DIAGNOSIS — M961 Postlaminectomy syndrome, not elsewhere classified: Secondary | ICD-10-CM | POA: Diagnosis not present

## 2016-04-27 DIAGNOSIS — G43719 Chronic migraine without aura, intractable, without status migrainosus: Secondary | ICD-10-CM | POA: Diagnosis not present

## 2016-04-29 DIAGNOSIS — M25511 Pain in right shoulder: Secondary | ICD-10-CM | POA: Diagnosis not present

## 2016-05-04 ENCOUNTER — Ambulatory Visit
Payer: Federal, State, Local not specified - PPO | Attending: Physical Medicine and Rehabilitation | Admitting: Physical Therapy

## 2016-05-04 ENCOUNTER — Encounter: Payer: Self-pay | Admitting: Physical Therapy

## 2016-05-04 DIAGNOSIS — M25511 Pain in right shoulder: Secondary | ICD-10-CM | POA: Diagnosis not present

## 2016-05-04 DIAGNOSIS — M79641 Pain in right hand: Secondary | ICD-10-CM

## 2016-05-04 DIAGNOSIS — M79601 Pain in right arm: Secondary | ICD-10-CM | POA: Diagnosis not present

## 2016-05-04 NOTE — Therapy (Signed)
[place], [place], [address] Phone: [phone]   Fax:  [phone]  Physical Therapy Evaluation  Patient Details  Name: [name] MRN: [id number] Date of Birth: 1970/09/17 Referring Provider: Esmond Plants, MD  Encounter Date: 05/04/2016      PT End of Session - 05/04/16 1100    Visit Number 1   Number of Visits 13   Date for PT Re-Evaluation 06/18/16   Authorization Type BC[employer] 30 visit limit   PT Start Time 1010   PT Stop Time 1100   PT Time Calculation (min) 50 min   Activity Tolerance Patient tolerated treatment well   Behavior During Therapy [facility] for tasks assessed/performed      Past Medical History:  Diagnosis Date  . Allergy   . Anxiety   . Asthma   . Bipolar 1 disorder ([place])   . Chronic lower back pain 02/07/2014  . Depression    excessive daytime sleepiness-MSLT confirmed pathological degree of hypersomnia-12/13  . Esophageal reflux   . Gastroparesis   . Hypersomnia   . Hypertension   . Medication monitoring encounter 02/07/2014  . Migraine   . Migraine   . Migraines   . Narcolepsy   . OCD (obsessive compulsive disorder)   . Opiate use 07/02/2015  . Osteoarthritis    knees, hands, shoulder, lower back  . Other iron deficiency anemias 08/28/2015  . Overactive bladder   . Psychosis   . Restless leg syndrome   . S/P lumbar fusion 2011  . Schizophrenia ([place])   . Schizophrenia, schizo-affective ([place])   . Seasonal allergies   . Sleep apnea    uses CPAP every night  . Torn rotator cuff it:2012,rt:2013    Past Surgical History:  Procedure Laterality Date  . ABDOMINAL HYSTERECTOMY  2001  . ANKLE FUSION     right  . CHOLECYSTECTOMY N/A 07/20/2013   Procedure: LAPAROSCOPIC CHOLECYSTECTOMY;  Surgeon: Harl Bowie, MD;  Location: [place];  Service: General;  Laterality: N/A;  . COLONOSCOPY WITH PROPOFOL N/A 10/02/2013   Procedure: COLONOSCOPY WITH PROPOFOL;  Surgeon:  Garlan Fair, MD;  Location: [facility];  Service: Endoscopy;  Laterality: N/A;  . ESOPHAGOGASTRODUODENOSCOPY (EGD) WITH PROPOFOL N/A 10/02/2013   Procedure: ESOPHAGOGASTRODUODENOSCOPY (EGD) WITH PROPOFOL;  Surgeon: Garlan Fair, MD;  Location: [facility];  Service: Endoscopy;  Laterality: N/A;  . FLEXIBLE SIGMOIDOSCOPY Left 04/07/2015   Procedure: FLEXIBLE SIGMOIDOSCOPY;  Surgeon: Ronald Lobo, MD;  [facility];  Service: Endoscopy;  Laterality: Left;  . FRACTURE SURGERY    . LAPAROSCOPIC SALPINGO OOPHERECTOMY Left 12/05/2013   Procedure: LAPAROSCOPIC SALPINGO OOPHORECTOMY;  Surgeon: Thurnell Lose, MD;  Location: [facility];  Service: Gynecology;  Laterality: Left;  . LUMBAR FUSION  2011   L4-L5  . OOPHORECTOMY   2004   right  . ROTATOR CUFF REPAIR  2012,2013   torn   . SHOULDER SURGERY     left and right , bone spurs, torn muscle  . SPINE SURGERY    . spurs,ruptured disc bone  2009  . TUBAL LIGATION  2004  . VESICOVAGINAL FISTULA CLOSURE W/ TAH  2001  . WISDOM TOOTH EXTRACTION      There were no vitals filed for this visit.       Subjective Assessment - 05/04/16 1018    Subjective Initially believed she slept wrong, feeling ache in shoulder. Pain remained for a couple of days and began moving down her hand. Hand feels like  it cramps and locks up in various positions. Feels like she has hit her funny bone. Medications do not seem to affect arm. Hypersenstiivity to touch and wind.    Patient Stated Goals bathing, opening jars   Currently in Pain? Yes   Pain Score 9    Pain Location Arm   Pain Orientation Right   Pain Descriptors / Indicators --  unable to describe   Pain Radiating Towards R upper extremity.    Aggravating Factors  always aggrivated   Pain Relieving Factors being still, blanket over shoulder to block wind            [facility] PT Assessment - 05/04/16 0001      Assessment   Medical Diagnosis R arm pain, GHJ pain   Referring Provider Esmond Plants, MD   Hand Dominance Right   Next MD Visit 9/21   Prior Therapy no     Precautions   Precautions None     Restrictions   Weight Bearing Restrictions No     Balance Screen   Has the patient fallen in the past 6 months No     [place] residence   Living Arrangements Children;Spouse/significant other     Prior Function   Level of Independence Independent     Cognition   Overall Cognitive Status Within Functional Limits for tasks assessed     Observation/Other Assessments   Focus on Therapeutic Outcomes (FOTO)  48% limitation     ROM / Strength   AROM / PROM / Strength AROM;Strength     AROM   Overall AROM Comments GHJ ROM WFL, concordant pain   AROM Assessment Site Cervical   Cervical Extension --  concordant pain R shoulder   Cervical - Right Side Bend 10  concordant pain, R shoulder   Cervical - Left Side Bend 20     Strength   Strength Assessment Site Hand   Right/Left hand Left;Right   Right Hand Grip (lbs) 35   Left Hand Grip (lbs) 40     Palpation   Palpation comment notable increase in muscular tension along R shoulder and arm                   [facility] Adult PT Treatment/Exercise - 05/04/16 0001      Exercises   Exercises Shoulder     Shoulder Exercises: Standing   Other Standing Exercises self nerve glide, median     Shoulder Exercises: Pulleys   Flexion Limitations 5 min, cues to relax     Manual Therapy   Manual Therapy Myofascial release;Manual Traction;Joint mobilization   Joint Mobilization R facet opening, lateral glides   Myofascial Release R upper trap, suboccipital release   Manual Traction cervical traction                PT Education - 05/04/16 1117    Education provided Yes   Education Details anatomy of condition, POC, HEP, exercise form/rationale, avoiding adhesive capsulitis   Person(s) Educated Patient   Methods Explanation;Demonstration;Tactile cues;Verbal cues    Comprehension Verbalized understanding;Returned demonstration;Verbal cues required;Tactile cues required;Need further instruction          PT Short Term Goals - 05/04/16 1300      PT SHORT TERM GOAL #1   Title pt will be independent with HEP as it has been established by 9/22   Baseline began establishing at eval   Time 3   Period Weeks   Status  New     PT SHORT TERM GOAL #2   Title Pt will demo 5 deg increase in R cervical sidebend   Baseline 10 deg to R   Time 3   Period Weeks   Status New     PT SHORT TERM GOAL #3   Title Verbalize decrease in hand cramping   Baseline frequent at eval   Time 3   Period Weeks   Status New     PT SHORT TERM GOAL #4   Title average pain <7/10   Baseline 9/10 at eval   Time 3   Period Weeks   Status New           PT Long Term Goals - 05/04/16 1303      PT LONG TERM GOAL #1   Title Pt will be able to use R hand to open tight jars by 10/13   Baseline unable at eval   Time 6   Period Weeks   Status New     PT LONG TERM GOAL #2   Title Minimal difficulty with bathing and self care activities   Baseline severe difficulty at eval   Time 6   Period Weeks   Status New     PT LONG TERM GOAL #3   Title Average pain <5/10 to decrease pain effects on daily activities   Baseline 9/10 at eval   Time 6   Period Weeks   Status New     PT LONG TERM GOAL #4   Title FOTO to 64% ability   Baseline 52% ability at eval   Time 6   Period Weeks   Status New               Plan - 05/04/16 1118    Clinical Impression Statement Pt presents to PT with complaints of R shoulder and arm pain that began when she woke up one morning. Pt presents with hypersensitivity throughout upper extremity and was instructed to make contact for desensitization. Neural tension noted as well as increased tension in cervical and shoulder musculature. Pt has history of bilateral RCR. Pt reported pain with extension and R sidebend indicating compression  of neural structures in R cervical region. Pt will benefit from skilled PT in order to decrease compression in cervical spine as well as improve functional use of R upper extremity.    Rehab Potential Good   PT Frequency 2x / week   PT Duration 6 weeks   PT Treatment/Interventions ADLs/Self Care Home Management;Cryotherapy;Electrical Stimulation;Iontophoresis 4mg /ml Dexamethasone;Ultrasound;Traction;Moist Heat;Therapeutic activities;Therapeutic exercise;Neuromuscular re-education;Patient/family education;Passive range of motion;Manual techniques;Dry needling;Taping   PT Next Visit Plan mechanical traction? pendulums, pulleys, ESTIM   PT Home Exercise Plan median nerve mob, AROM in mirror   Consulted and Agree with Plan of Care Patient      Patient will benefit from skilled therapeutic intervention in order to improve the following deficits and impairments:  Impaired UE functional use, Increased muscle spasms, Decreased activity tolerance, Pain, Impaired sensation  Visit Diagnosis: Pain in right shoulder - Plan: PT plan of care cert/re-cert  Pain In Right Arm - Plan: PT plan of care cert/re-cert  Pain in right hand - Plan: PT plan of care cert/re-cert     Problem List Patient Active Problem List   Diagnosis Date Noted  . Neurogenic pain 01/26/2016  . Other iron deficiency anemias 08/28/2015  . Nondiabetic gastroparesis 08/28/2015  . Chronic lower back pain (L>R) (Primary Pain) 07/02/2015  . Medication monitoring encounter 07/02/2015  .  Chronic pain 07/02/2015  . Opiate use 07/02/2015  . Long term prescription opiate use 07/02/2015  . Long term current use of opiate analgesic 07/02/2015  . Encounter for therapeutic drug level monitoring 07/02/2015  . 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 Epidural fibrosis 07/02/2015  . Chronic lumbar radicular pain (Bilateral) (L>R) 07/02/2015  . Bilateral lower extremity pain (L>R) 07/02/2015  . Drug tolerance to opioids 07/02/2015   . Opiate analgesic contract exists 07/02/2015  . Lumbar spondylosis 07/02/2015  . Lumbar facet hypertrophy 07/02/2015  . Grade 1 Retrolisthesis of L1 over L2 07/02/2015  . Grade 1 Anterolisthesis of L5 over S1 07/02/2015  . Lumbar facet arthropathy 07/02/2015  . 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 Enteritis 04/06/2015  . Gastroparesis due to DM ([place])   . Colitis, acute 04/04/2015  . Dental infection 04/04/2015  . Nausea with vomiting   . Leukocytosis 04/03/2015  . Nausea and vomiting 04/02/2015  . Nausea vomiting and diarrhea 04/02/2015  . Abdominal pain 04/02/2015  . Extrinsic asthma 04/02/2015  . Chronic cholecystitis 07/02/2013  . 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 UARS (upper airway resistance syndrome) 02/14/2013  . Dyspnea 06/09/2011   Deshea Pooley C. Jennell Janosik PT, DPT 05/04/16 1:29 PM   [facility] [address] [place], [place], [address] Phone: [phone]   Fax:  [phone]  Name: [name] MRN: [id number] Date of Birth: [birth year]

## 2016-05-05 ENCOUNTER — Other Ambulatory Visit: Payer: Self-pay | Admitting: Neurology

## 2016-05-05 DIAGNOSIS — H35033 Hypertensive retinopathy, bilateral: Secondary | ICD-10-CM | POA: Diagnosis not present

## 2016-05-05 DIAGNOSIS — K3184 Gastroparesis: Secondary | ICD-10-CM

## 2016-05-05 DIAGNOSIS — G47419 Narcolepsy without cataplexy: Secondary | ICD-10-CM

## 2016-05-05 MED ORDER — AMPHETAMINE-DEXTROAMPHET ER 20 MG PO CP24: 20.0000 mg | ORAL_CAPSULE | Freq: Every day | ORAL | 0 refills | Status: DC

## 2016-05-05 MED ORDER — AMPHETAMINE-DEXTROAMPHETAMINE 5 MG PO TABS: ORAL_TABLET | ORAL | 0 refills | Status: DC

## 2016-05-05 NOTE — Addendum Note (Signed)
Addended by: Lester Barren A on: 05/05/2016 01:19 PM   Modules accepted: Orders

## 2016-05-05 NOTE — Telephone Encounter (Signed)
Patient requesting refill of amphetamine-dextroamphetamine (ADDERALL XR) 20 MG 24 hr capsule. I advised the Rx will be ready in 24 hours unless the nurse advises otherwise.

## 2016-05-05 NOTE — Telephone Encounter (Signed)
I called pt to advise her that her RXs are ready for pick up at the front desk. No answer, left a message asking her to call me back. If pt calls back, please advise her of this information.

## 2016-05-06 ENCOUNTER — Encounter: Payer: Self-pay | Admitting: Physical Therapy

## 2016-05-06 ENCOUNTER — Ambulatory Visit: Payer: Federal, State, Local not specified - PPO | Admitting: Physical Therapy

## 2016-05-06 DIAGNOSIS — M79601 Pain in right arm: Secondary | ICD-10-CM | POA: Diagnosis not present

## 2016-05-06 DIAGNOSIS — M79641 Pain in right hand: Secondary | ICD-10-CM | POA: Diagnosis not present

## 2016-05-06 DIAGNOSIS — I10 Essential (primary) hypertension: Secondary | ICD-10-CM | POA: Diagnosis not present

## 2016-05-06 DIAGNOSIS — N3281 Overactive bladder: Secondary | ICD-10-CM | POA: Diagnosis not present

## 2016-05-06 DIAGNOSIS — M25511 Pain in right shoulder: Secondary | ICD-10-CM | POA: Diagnosis not present

## 2016-05-06 DIAGNOSIS — Z0000 Encounter for general adult medical examination without abnormal findings: Secondary | ICD-10-CM | POA: Diagnosis not present

## 2016-05-06 DIAGNOSIS — Z23 Encounter for immunization: Secondary | ICD-10-CM | POA: Diagnosis not present

## 2016-05-06 NOTE — Therapy (Signed)
[place], [place], [address] Phone: [phone]   Fax:  [phone]  Physical Therapy Treatment  Patient Details  Name: [name] MRN: [id number] Date of Birth: 1970/12/16 Referring Provider: Esmond Plants, MD  Encounter Date: 05/06/2016      PT End of Session - 05/06/16 0934    Visit Number 2   Number of Visits 13   Date for PT Re-Evaluation 06/18/16   Authorization Type [employer] 30 visit limit   PT Start Time 0931   PT Stop Time 1029   PT Time Calculation (min) 58 min   Activity Tolerance Patient tolerated treatment well   Behavior During Therapy [facility] for tasks assessed/performed      Past Medical History:  Diagnosis Date  . Allergy   . Anxiety   . Asthma   . Bipolar 1 disorder ([place])   . Chronic lower back pain 02/07/2014  . Depression    excessive daytime sleepiness-MSLT confirmed pathological degree of hypersomnia-12/13  . Esophageal reflux   . Gastroparesis   . Hypersomnia   . Hypertension   . Medication monitoring encounter 02/07/2014  . Migraine   . Migraine   . Migraines   . Narcolepsy   . OCD (obsessive compulsive disorder)   . Opiate use 07/02/2015  . Osteoarthritis    knees, hands, shoulder, lower back  . Other iron deficiency anemias 08/28/2015  . Overactive bladder   . Psychosis   . Restless leg syndrome   . S/P lumbar fusion 2011  . Schizophrenia ([place])   . Schizophrenia, schizo-affective ([place])   . Seasonal allergies   . Sleep apnea    uses CPAP every night  . Torn rotator cuff it:2012,rt:2013    Past Surgical History:  Procedure Laterality Date  . ABDOMINAL HYSTERECTOMY  2001  . ANKLE FUSION     right  . CHOLECYSTECTOMY N/A 07/20/2013   Procedure: LAPAROSCOPIC CHOLECYSTECTOMY;  Surgeon: Harl Bowie, MD;  Location: [place];  Service: General;  Laterality: N/A;  . COLONOSCOPY WITH PROPOFOL N/A 10/02/2013   Procedure: COLONOSCOPY WITH PROPOFOL;  Surgeon: Garlan Fair, MD;  Location: [facility];  Service: Endoscopy;  Laterality: N/A;  . ESOPHAGOGASTRODUODENOSCOPY (EGD) WITH PROPOFOL N/A 10/02/2013   Procedure: ESOPHAGOGASTRODUODENOSCOPY (EGD) WITH PROPOFOL;  Surgeon: Garlan Fair, MD;  Location: [facility];  Service: Endoscopy;  Laterality: N/A;  . FLEXIBLE SIGMOIDOSCOPY Left 04/07/2015   Procedure: FLEXIBLE SIGMOIDOSCOPY;  Surgeon: Ronald Lobo, MD;  Location: [facility] END[facility]COPY;  Service: Endoscopy;  Laterality: Left;  . FRACTURE SURGERY    . LAPAROSCOPIC SALPINGO OOPHERECTOMY Left 12/05/2013   Procedure: LAPAROSCOPIC SALPINGO OOPHORECTOMY;  Surgeon: Thurnell Lose, MD;  Location: [facility];  Service: Gynecology;  Laterality: Left;  . LUMBAR FUSION  2011   L4-L5  . OOPHORECTOMY   2004   right  . ROTATOR CUFF REPAIR  2012,2013   torn   . SHOULDER SURGERY     left and right , bone spurs, torn muscle  . SPINE SURGERY    . spurs,ruptured disc bone  2009  . TUBAL LIGATION  2004  . VESICOVAGINAL FISTULA CLOSURE W/ TAH  2001  . WISDOM TOOTH EXTRACTION      There were no vitals filed for this visit.      Subjective Assessment - 05/06/16 0933    Subjective Feels about the same today. Did desensitization techniquest at home and feels that it makes a difference.    Currently in Pain? Yes  Pain Score 9    Pain Location Arm   Pain Orientation Right                         [facility] Adult PT Treatment/Exercise - 05/06/16 0001      Shoulder Exercises: Seated   Other Seated Exercises scapular retractions     Shoulder Exercises: Pulleys   Flexion 3 minutes     Shoulder Exercises: Stretch   Other Shoulder Stretches strap first rib depression     Modalities   Modalities Cryotherapy     Cryotherapy   Number Minutes Cryotherapy 10 Minutes   Cryotherapy Location Cervical   Type of Cryotherapy Ice pack     Manual Therapy   Manual Therapy Neural Stretch   Joint Mobilization R facet opening mobilizations   Myofascial Release R  upper trap   Manual Traction manual cervical traction   Neural Stretch median nerve glides                PT Education - 05/06/16 0934    Education provided Yes   Education Details exercise form/rationale   Person(s) Educated Patient   Methods Explanation;Demonstration;Tactile cues;Verbal cues   Comprehension Verbalized understanding;Returned demonstration;Verbal cues required;Tactile cues required;Need further instruction          PT Short Term Goals - 05/04/16 1300      PT SHORT TERM GOAL #1   Title pt will be independent with HEP as it has been established by 9/22   Baseline began establishing at eval   Time 3   Period Weeks   Status New     PT SHORT TERM GOAL #2   Title Pt will demo 5 deg increase in R cervical sidebend   Baseline 10 deg to R   Time 3   Period Weeks   Status New     PT SHORT TERM GOAL #3   Title Verbalize decrease in hand cramping   Baseline frequent at eval   Time 3   Period Weeks   Status New     PT SHORT TERM GOAL #4   Title average pain <7/10   Baseline 9/10 at eval   Time 3   Period Weeks   Status New           PT Long Term Goals - 05/04/16 1303      PT LONG TERM GOAL #1   Title Pt will be able to use R hand to open tight jars by 10/13   Baseline unable at eval   Time 6   Period Weeks   Status New     PT LONG TERM GOAL #2   Title Minimal difficulty with bathing and self care activities   Baseline severe difficulty at eval   Time 6   Period Weeks   Status New     PT LONG TERM GOAL #3   Title Average pain <5/10 to decrease pain effects on daily activities   Baseline 9/10 at eval   Time 6   Period Weeks   Status New     PT LONG TERM GOAL #4   Title FOTO to 64% ability   Baseline 52% ability at eval   Time 6   Period Weeks   Status New               Plan - 05/06/16 1527    Clinical Impression Statement Pt presents with +Spurlings to R with reported dizziness and concordant symptoms  increased into  R upper extremity, negative Adson's test. Notable lack of movement at C4-5 level in L sidebend or flexion with increased concordatn pain at that level with R sidebend and extension. Notable neural tension in median nerve testing position and elevated first rib on R. Increased concordant pain with palpation to bilateral elbow tunnels.   PT Next Visit Plan manual traction, R facet opening, first rib depression, periscapular retraction strength      Patient will benefit from skilled therapeutic intervention in order to improve the following deficits and impairments:     Visit Diagnosis: Pain in right shoulder  Pain In Right Arm  Pain in right hand     Problem List Patient Active Problem List   Diagnosis Date Noted  . Neurogenic pain 01/26/2016  . Other iron deficiency anemias 08/28/2015  . Nondiabetic gastroparesis 08/28/2015  . Chronic lower back pain (L>R) (Primary Pain) 07/02/2015  . Medication monitoring encounter 07/02/2015  . Chronic pain 07/02/2015  . Opiate use 07/02/2015  . Long term prescription opiate use 07/02/2015  . Long term current use of opiate analgesic 07/02/2015  . Encounter for therapeutic drug level monitoring 07/02/2015  . 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 Epidural fibrosis 07/02/2015  . Chronic lumbar radicular pain (Bilateral) (L>R) 07/02/2015  . Bilateral lower extremity pain (L>R) 07/02/2015  . Drug tolerance to opioids 07/02/2015  . Opiate analgesic contract exists 07/02/2015  . Lumbar spondylosis 07/02/2015  . Lumbar facet hypertrophy 07/02/2015  . Grade 1 Retrolisthesis of L1 over L2 07/02/2015  . Grade 1 Anterolisthesis of L5 over S1 07/02/2015  . Lumbar facet arthropathy 07/02/2015  . 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 Enteritis 04/06/2015  . Gastroparesis due to DM ([place])   . Colitis, acute 04/04/2015  . Dental infection 04/04/2015  . Nausea with vomiting   . Leukocytosis 04/03/2015  . Nausea and vomiting 04/02/2015  . Nausea  vomiting and diarrhea 04/02/2015  . Abdominal pain 04/02/2015  . Extrinsic asthma 04/02/2015  . Chronic cholecystitis 07/02/2013  . 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 UARS (upper airway resistance syndrome) 02/14/2013  . Dyspnea 06/09/2011    Afton Lavalle C. Maja Mccaffery PT, DPT 05/06/16 3:43 PM   [facility] [address] [place], [place], [address] Phone: [phone]   Fax:  [phone]  Name: [name] MRN: [id number] Date of Birth: 1971-08-23

## 2016-05-12 ENCOUNTER — Ambulatory Visit
Payer: Federal, State, Local not specified - PPO | Attending: Physical Medicine and Rehabilitation | Admitting: Physical Therapy

## 2016-05-12 DIAGNOSIS — M79641 Pain in right hand: Secondary | ICD-10-CM | POA: Diagnosis not present

## 2016-05-12 DIAGNOSIS — M79601 Pain in right arm: Secondary | ICD-10-CM

## 2016-05-12 DIAGNOSIS — M25511 Pain in right shoulder: Secondary | ICD-10-CM | POA: Diagnosis not present

## 2016-05-12 NOTE — Therapy (Signed)
[place], [place], [address] Phone: [phone]   Fax:  [phone]  Physical Therapy Treatment  Patient Details  Name: [name] MRN: [id number] Date of Birth: 27-Feb-1971 Referring Provider: Esmond Plants, MD  Encounter Date: 05/12/2016      PT End of Session - 05/12/16 1233    Visit Number 3   Number of Visits 13   Date for PT Re-Evaluation 06/18/16   Authorization Type [employer] 30 visit limit   PT Start Time 1230   PT Stop Time [id number]   PT Time Calculation (min) 68 min      Past Medical History:  Diagnosis Date  . Allergy   . Anxiety   . Asthma   . Bipolar 1 disorder ([place])   . Chronic lower back pain 02/07/2014  . Depression    excessive daytime sleepiness-MSLT confirmed pathological degree of hypersomnia-12/13  . Esophageal reflux   . Gastroparesis   . Hypersomnia   . Hypertension   . Medication monitoring encounter 02/07/2014  . Migraine   . Migraine   . Migraines   . Narcolepsy   . OCD (obsessive compulsive disorder)   . Opiate use 07/02/2015  . Osteoarthritis    knees, hands, shoulder, lower back  . Other iron deficiency anemias 08/28/2015  . Overactive bladder   . Psychosis   . Restless leg syndrome   . S/P lumbar fusion 2011  . Schizophrenia ([place])   . Schizophrenia, schizo-affective ([place])   . Seasonal allergies   . Sleep apnea    uses CPAP every night  . Torn rotator cuff it:2012,rt:2013    Past Surgical History:  Procedure Laterality Date  . ABDOMINAL HYSTERECTOMY  2001  . ANKLE FUSION     right  . CHOLECYSTECTOMY N/A 07/20/2013   Procedure: LAPAROSCOPIC CHOLECYSTECTOMY;  Surgeon: Harl Bowie, MD;  Location: [facility];  Service: General;  Laterality: N/A;  . COLONOSCOPY WITH PROPOFOL N/A 10/02/2013   Procedure: COLONOSCOPY WITH PROPOFOL;  Surgeon: Garlan Fair, MD;  Location: [facility];  Service: Endoscopy;  Laterality: N/A;  . ESOPHAGOGASTRODUODENOSCOPY (EGD)  WITH PROPOFOL N/A 10/02/2013   Procedure: ESOPHAGOGASTRODUODENOSCOPY (EGD) WITH PROPOFOL;  Surgeon: Garlan Fair, MD;  Location: [facility];  Service: Endoscopy;  Laterality: N/A;  . FLEXIBLE SIGMOIDOSCOPY Left 04/07/2015   Procedure: FLEXIBLE SIGMOIDOSCOPY;  Surgeon: Ronald Lobo, MD;  Location: [facility] ENDOSCOPY;  Service: Endoscopy;  Laterality: Left;  . FRACTURE SURGERY    . LAPAROSCOPIC SALPINGO OOPHERECTOMY Left 12/05/2013   Procedure: LAPAROSCOPIC SALPINGO OOPHORECTOMY;  Surgeon: Thurnell Lose, MD;  Location: [facility];  Service: Gynecology;  Laterality: Left;  . LUMBAR FUSION  2011   L4-L5  . OOPHORECTOMY   2004   right  . ROTATOR CUFF REPAIR  2012,2013   torn   . SHOULDER SURGERY     left and right , bone spurs, torn muscle  . SPINE SURGERY    . spurs,ruptured disc bone  2009  . TUBAL LIGATION  2004  . VESICOVAGINAL FISTULA CLOSURE W/ TAH  2001  . WISDOM TOOTH EXTRACTION      There were no vitals filed for this visit.      Subjective Assessment - 05/12/16 1234    Currently in Pain? Yes   Pain Score 9    Pain Location Shoulder   Pain Orientation Right   Pain Radiating Towards R UE   Aggravating Factors  using RUE   Pain Relieving Factors heat  [place] Adult PT Treatment/Exercise - 05/12/16 0001      Shoulder Exercises: Supine   Other Supine Exercises Decompression series 5 x each x 5 sec     Shoulder Exercises: Seated   Other Seated Exercises scapular retractions   Other Seated Exercises neck traction x 5      Shoulder Exercises: Standing   Other Standing Exercises self nerve glide, median     Shoulder Exercises: Pulleys   Flexion 3 minutes     Modalities   Modalities Electrical Stimulation;Moist Heat     Moist Heat Therapy   Number Minutes Moist Heat 15 Minutes   Moist Heat Location Cervical  upper traps     Electrical Stimulation   Electrical Stimulation Location upper traps, posterior neck   Electrical  Stimulation Action IFC   Electrical Stimulation Parameters 44ma   Electrical Stimulation Goals Pain     Manual Therapy   Myofascial Release R upper trap   Manual Traction manual cervical traction   Neural Stretch median nerve glides                PT Education - 05/12/16 1332    Education provided Yes   Education Details dry needling information   Person(s) Educated Patient   Methods Explanation;Handout   Comprehension Verbalized understanding          PT Short Term Goals - 05/04/16 1300      PT SHORT TERM GOAL #1   Title pt will be independent with HEP as it has been established by 9/22   Baseline began establishing at eval   Time 3   Period Weeks   Status New     PT SHORT TERM GOAL #2   Title Pt will demo 5 deg increase in R cervical sidebend   Baseline 10 deg to R   Time 3   Period Weeks   Status New     PT SHORT TERM GOAL #3   Title Verbalize decrease in hand cramping   Baseline frequent at eval   Time 3   Period Weeks   Status New     PT SHORT TERM GOAL #4   Title average pain <7/10   Baseline 9/10 at eval   Time 3   Period Weeks   Status New           PT Long Term Goals - 05/04/16 1303      PT LONG TERM GOAL #1   Title Pt will be able to use R hand to open tight jars by 10/13   Baseline unable at eval   Time 6   Period Weeks   Status New     PT LONG TERM GOAL #2   Title Minimal difficulty with bathing and self care activities   Baseline severe difficulty at eval   Time 6   Period Weeks   Status New     PT LONG TERM GOAL #3   Title Average pain <5/10 to decrease pain effects on daily activities   Baseline 9/10 at eval   Time 6   Period Weeks   Status New     PT LONG TERM GOAL #4   Title FOTO to 64% ability   Baseline 52% ability at eval   Time 6   Period Weeks   Status New               Plan - 05/12/16 1341    Clinical Impression Statement pt presents rating pain at 9/10 in  right upper trap, upper thoracic  pain down right upper arm, some N/T at wrist. Performed decompression series and manual cervical traction, median nerve glides as previous. Upon rising, pt reports no change in sx. Her right upper trap is hypertrophic compared to left. Soft tissue work to right upper trap to decrease tension. Trial of IFC for pain. Pt reports she arm pain after IFC.    PT Next Visit Plan manual traction, R facet opening, first rib depression, periscapular retraction strength; assess benefit of IFC, manual to right upper trap , TPDN soon.       Patient will benefit from skilled therapeutic intervention in order to improve the following deficits and impairments:  Impaired UE functional use, Increased muscle spasms, Decreased activity tolerance, Pain, Impaired sensation  Visit Diagnosis: Pain in right shoulder  Pain In Right Arm  Pain in right hand     Problem List Patient Active Problem List   Diagnosis Date Noted  . Neurogenic pain 01/26/2016  . Other iron deficiency anemias 08/28/2015  . Nondiabetic gastroparesis 08/28/2015  . Chronic lower back pain (L>R) (Primary Pain) 07/02/2015  . Medication monitoring encounter 07/02/2015  . Chronic pain 07/02/2015  . Opiate use 07/02/2015  . Long term prescription opiate use 07/02/2015  . Long term current use of opiate analgesic 07/02/2015  . Encounter for therapeutic drug level monitoring 07/02/2015  . 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 Epidural fibrosis 07/02/2015  . Chronic lumbar radicular pain (Bilateral) (L>R) 07/02/2015  . Bilateral lower extremity pain (L>R) 07/02/2015  . Drug tolerance to opioids 07/02/2015  . Opiate analgesic contract exists 07/02/2015  . Lumbar spondylosis 07/02/2015  . Lumbar facet hypertrophy 07/02/2015  . Grade 1 Retrolisthesis of L1 over L2 07/02/2015  . Grade 1 Anterolisthesis of L5 over S1 07/02/2015  . Lumbar facet arthropathy 07/02/2015  . 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 Enteritis 04/06/2015  .  Gastroparesis due to DM ([place])   . Colitis, acute 04/04/2015  . Dental infection 04/04/2015  . Nausea with vomiting   . Leukocytosis 04/03/2015  . Nausea and vomiting 04/02/2015  . Nausea vomiting and diarrhea 04/02/2015  . Abdominal pain 04/02/2015  . Extrinsic asthma 04/02/2015  . Chronic cholecystitis 07/02/2013  . 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 UARS (upper airway resistance syndrome) 02/14/2013  . Dyspnea 06/09/2011    Dorene Ar, PTA 05/12/2016, 1:49 PM  [place], [place], [address] Phone: [phone]   Fax:  [phone]  Name: [name] MRN: [id number] Date of Birth: 11-11-1970

## 2016-05-12 NOTE — Patient Instructions (Signed)

## 2016-05-13 ENCOUNTER — Telehealth: Payer: Self-pay | Admitting: *Deleted

## 2016-05-13 NOTE — Telephone Encounter (Signed)
PA for Adderall 5mg  po once daily completed by phone with [facility].  Approved for dates 03-24-16 thru 05-13-17.  No PA# but they will fax confirmation of approval.  Notice of approval faxed to [facility] fax# [phone]/fim

## 2016-05-17 DIAGNOSIS — Z79890 Hormone replacement therapy (postmenopausal): Secondary | ICD-10-CM | POA: Diagnosis not present

## 2016-05-18 ENCOUNTER — Ambulatory Visit: Payer: Federal, State, Local not specified - PPO | Admitting: Physical Therapy

## 2016-05-20 ENCOUNTER — Ambulatory Visit: Payer: Federal, State, Local not specified - PPO | Admitting: Physical Therapy

## 2016-05-20 DIAGNOSIS — M25511 Pain in right shoulder: Secondary | ICD-10-CM

## 2016-05-20 DIAGNOSIS — M79641 Pain in right hand: Secondary | ICD-10-CM

## 2016-05-20 DIAGNOSIS — M79601 Pain in right arm: Secondary | ICD-10-CM | POA: Diagnosis not present

## 2016-05-20 NOTE — Therapy (Signed)
[place], [place], [address] Phone: [phone]   Fax:  [phone]  Physical Therapy Treatment  Patient Details  Name: [name] MRN: [id number] Date of Birth: 1971/07/25 Referring Provider: Esmond Plants, MD  Encounter Date: 05/20/2016      PT End of Session - 05/20/16 1555    Visit Number 4   Number of Visits 13   Date for PT Re-Evaluation 06/18/16   Authorization Type BCBS 30 visit limit   PT Start Time 1500   PT Stop Time 1553   PT Time Calculation (min) 53 min   Activity Tolerance Patient tolerated treatment well   Behavior During Therapy [facility] for tasks assessed/performed      Past Medical History:  Diagnosis Date  . Allergy   . Anxiety   . Asthma   . Bipolar 1 disorder ([place])   . Chronic lower back pain 02/07/2014  . Depression    excessive daytime sleepiness-MSLT confirmed pathological degree of hypersomnia-12/13  . Esophageal reflux   . Gastroparesis   . Hypersomnia   . Hypertension   . Medication monitoring encounter 02/07/2014  . Migraine   . Migraine   . Migraines   . Narcolepsy   . OCD (obsessive compulsive disorder)   . Opiate use 07/02/2015  . Osteoarthritis    knees, hands, shoulder, lower back  . Other iron deficiency anemias 08/28/2015  . Overactive bladder   . Psychosis   . Restless leg syndrome   . S/P lumbar fusion 2011  . Schizophrenia ([place])   . Schizophrenia, schizo-affective ([place])   . Seasonal allergies   . Sleep apnea    uses CPAP every night  . Torn rotator cuff it:2012,rt:2013    Past Surgical History:  Procedure Laterality Date  . ABDOMINAL HYSTERECTOMY  2001  . ANKLE FUSION     right  . CHOLECYSTECTOMY N/A 07/20/2013   Procedure: LAPAROSCOPIC CHOLECYSTECTOMY;  Surgeon: Harl Bowie, MD;  Location: [facility];  Service: General;  Laterality: N/A;  . COLONOSCOPY WITH PROPOFOL N/A 10/02/2013   Procedure: COLONOSCOPY WITH PROPOFOL;  Surgeon: Garlan Fair, MD;  Location: [facility];  Service: Endoscopy;  Laterality: N/A;  . ESOPHAGOGASTRODUODENOSCOPY (EGD) WITH PROPOFOL N/A 10/02/2013   Procedure: ESOPHAGOGASTRODUODENOSCOPY (EGD) WITH PROPOFOL;  Surgeon: Garlan Fair, MD;  Location: [facility];  Service: Endoscopy;  Laterality: N/A;  . FLEXIBLE SIGMOIDOSCOPY Left 04/07/2015   Procedure: FLEXIBLE SIGMOIDOSCOPY;  Surgeon: Ronald Lobo, MD;  Location: [facility] ENDOSCOPY;  Service: Endoscopy;  Laterality: Left;  . FRACTURE SURGERY    . LAPAROSCOPIC SALPINGO OOPHERECTOMY Left 12/05/2013   Procedure: LAPAROSCOPIC SALPINGO OOPHORECTOMY;  Surgeon: Thurnell Lose, MD;  Location: [facility];  Service: Gynecology;  Laterality: Left;  . LUMBAR FUSION  2011   L4-L5  . OOPHORECTOMY   2004   right  . ROTATOR CUFF REPAIR  2012,2013   torn   . SHOULDER SURGERY     left and right , bone spurs, torn muscle  . SPINE SURGERY    . spurs,ruptured disc bone  2009  . TUBAL LIGATION  2004  . VESICOVAGINAL FISTULA CLOSURE W/ TAH  2001  . WISDOM TOOTH EXTRACTION      There were no vitals filed for this visit.      Subjective Assessment - 05/20/16 1507    Subjective "still having alot of pain, tightness still in the shoulder and having radiating pain into the forearm and cramping into the arm"    Currently  in Pain? Yes   Pain Score 8   took meds for pain 2pm   Pain Location Shoulder   Pain Orientation Right   Pain Descriptors / Indicators Pins and needles;Aching;Tightness;Sore   Pain Frequency Constant   Aggravating Factors  using the RUE   Pain Relieving Factors heat                         [facility] Adult PT Treatment/Exercise - 05/20/16 0001      Moist Heat Therapy   Number Minutes Moist Heat 10 Minutes   Moist Heat Location Cervical     Manual Therapy   Manual Therapy Soft tissue mobilization   Joint Mobilization Grade 2-3 C7-C3 R lateral joint opening, Grade 3 R 1st rib inferior joint mobs   Soft tissue mobilization IASTM  over  Rupper trap   Myofascial Release DTM/ fascial stretching/ rolling of the upper trap          Trigger Point Dry Needling - 05/20/16 1553    Consent Given? Yes   Education Handout Provided Yes   Muscles Treated Upper Body Upper trapezius  scalenes x 1 with pistoning/ twisting techniques   Upper Trapezius Response Twitch reponse elicited;Palpable increased muscle length  x 3 with pistoning              PT Education - 05/20/16 1554    Education provided Yes   Education Details anatomy educaiton regarding formation of trigger points, benefits of TPDN and mechanics, what to expect and after care.    Person(s) Educated Patient   Methods Explanation;Verbal cues   Comprehension Verbalized understanding;Verbal cues required          PT Short Term Goals - 05/04/16 1300      PT SHORT TERM GOAL #1   Title pt will be independent with HEP as it has been established by 9/22   Baseline began establishing at eval   Time 3   Period Weeks   Status New     PT SHORT TERM GOAL #2   Title Pt will demo 5 deg increase in R cervical sidebend   Baseline 10 deg to R   Time 3   Period Weeks   Status New     PT SHORT TERM GOAL #3   Title Verbalize decrease in hand cramping   Baseline frequent at eval   Time 3   Period Weeks   Status New     PT SHORT TERM GOAL #4   Title average pain <7/10   Baseline 9/10 at eval   Time 3   Period Weeks   Status New           PT Long Term Goals - 05/04/16 1303      PT LONG TERM GOAL #1   Title Pt will be able to use R hand to open tight jars by 10/13   Baseline unable at eval   Time 6   Period Weeks   Status New     PT LONG TERM GOAL #2   Title Minimal difficulty with bathing and self care activities   Baseline severe difficulty at eval   Time 6   Period Weeks   Status New     PT LONG TERM GOAL #3   Title Average pain <5/10 to decrease pain effects on daily activities   Baseline 9/10 at eval   Time 6   Period Weeks    Status New  PT LONG TERM GOAL #4   Title FOTO to 64% ability   Baseline 52% ability at eval   Time 6   Period Weeks   Status New               Plan - 05/20/16 1603    Clinical Impression Statement pt reports pain at 8/10 today. performed [place] and testing which was postive for thoracic outlet with increased R arm tingling/ pain with head turned to the R. performed TPDN on the R upper trap and scalenes, following soft tissue work she reported decreased pain and tightness. following mobs and TPDN retested and pt had no pain post session. utilized MHP post session which pt reported pain at 3/10 pain.      PT Next Visit Plan assess response to DN, manual traction, R facet opening, first rib depression, periscapular retraction strength; assess benefit of IFC, manual to right upper trap , TPDN soon.    Consulted and Agree with Plan of Care Patient      Patient will benefit from skilled therapeutic intervention in order to improve the following deficits and impairments:  Impaired UE functional use, Increased muscle spasms, Decreased activity tolerance, Pain, Impaired sensation  Visit Diagnosis: Pain in right shoulder  Pain In Right Arm  Pain in right hand     Problem List Patient Active Problem List   Diagnosis Date Noted  . Neurogenic pain 01/26/2016  . Other iron deficiency anemias 08/28/2015  . Nondiabetic gastroparesis 08/28/2015  . Chronic lower back pain (L>R) (Primary Pain) 07/02/2015  . Medication monitoring encounter 07/02/2015  . Chronic pain 07/02/2015  . Opiate use 07/02/2015  . Long term prescription opiate use 07/02/2015  . Long term current use of opiate analgesic 07/02/2015  . Encounter for therapeutic drug level monitoring 07/02/2015  . 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 Epidural fibrosis 07/02/2015  . Chronic lumbar radicular pain (Bilateral) (L>R) 07/02/2015  . Bilateral lower extremity pain (L>R) 07/02/2015  . Drug tolerance to  opioids 07/02/2015  . Opiate analgesic contract exists 07/02/2015  . Lumbar spondylosis 07/02/2015  . Lumbar facet hypertrophy 07/02/2015  . Grade 1 Retrolisthesis of L1 over L2 07/02/2015  . Grade 1 Anterolisthesis of L5 over S1 07/02/2015  . Lumbar facet arthropathy 07/02/2015  . 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 Enteritis 04/06/2015  . Gastroparesis due to DM ([place])   . Colitis, acute 04/04/2015  . Dental infection 04/04/2015  . Nausea with vomiting   . Leukocytosis 04/03/2015  . Nausea and vomiting 04/02/2015  . Nausea vomiting and diarrhea 04/02/2015  . Abdominal pain 04/02/2015  . Extrinsic asthma 04/02/2015  . Chronic cholecystitis 07/02/2013  . 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 UARS (upper airway resistance syndrome) 02/14/2013  . Dyspnea 06/09/2011   Starr Lake PT, DPT, LAT, ATC  05/20/16  4:09 PM      [facility] [address] [place], [place], [address] Phone: [phone]   Fax:  [phone]  Name: [name] MRN: [id number] Date of Birth: 24-Aug-1971

## 2016-05-24 ENCOUNTER — Ambulatory Visit: Payer: Federal, State, Local not specified - PPO | Admitting: Physical Therapy

## 2016-05-25 ENCOUNTER — Ambulatory Visit (INDEPENDENT_AMBULATORY_CARE_PROVIDER_SITE_OTHER): Payer: Federal, State, Local not specified - PPO | Admitting: Adult Health

## 2016-05-25 ENCOUNTER — Encounter: Payer: Self-pay | Admitting: Adult Health

## 2016-05-25 VITALS — BP 139/89 | HR 82 | Ht 62.0 in | Wt 168.4 lb

## 2016-05-25 DIAGNOSIS — G4733 Obstructive sleep apnea (adult) (pediatric): Secondary | ICD-10-CM | POA: Diagnosis not present

## 2016-05-25 DIAGNOSIS — Z9989 Dependence on other enabling machines and devices: Secondary | ICD-10-CM

## 2016-05-25 DIAGNOSIS — G47419 Narcolepsy without cataplexy: Secondary | ICD-10-CM

## 2016-05-25 NOTE — Patient Instructions (Signed)
Continue Adderall Bring card by here this week for download If your symptoms worsen or you develop new symptoms please let us know.

## 2016-05-25 NOTE — Progress Notes (Signed)
I agree with the assessment and plan as directed by NP .The patient is known to me .   Donnis Phaneuf, MD  

## 2016-05-25 NOTE — Progress Notes (Signed)
PATIENT: [name] DOB: February 26, 1971  REASON FOR VISIT: follow up- narcolepsy, sleep apnea HISTORY FROM: patient  HISTORY OF PRESENT ILLNESS: [name] is a 45 year old female with a history of narcolepsy and sleep apnea. She returns today for follow-up. The patient has again not brought her card with her to this visit. She states that she is more fatigued. She continues Adderall XR release 20 mg in the morning and Adderall immediate release lunchtime 10mg  at lunchtime. She reports that she does find this beneficial. She returns today for an evaluation.    HISTORY  02/23/2016 :[name] is a 45 year old female with a history of narcolepsy and sleep apnea. She returns today for follow-up. At the last visit her Adderall was adjusted. She is taking Adderall extended release 20 mg in the morning and Adderall immediate release 10 mg at lunchtime if needed. She reports that this has been helpful although she still gets sleepy later in the afternoons. The patient did bring her card with her today however she states that when she removed the card from her device she noticed that it was not pressed and all the way. We are unable to obtain a download today. It appears that the patient has not had a download since 2013. The patient's Epworth sleepiness score is 14 and fatigue severity score is 56. She returns today for evaluation.  HISTORY 02/26/15: [name] is a 45 year old female with a history of narcolepsy. She returns today for follow-up. She is currently taking Adderall XR 20 mg 1 capsule daily. She reports that this works well initially. However by midday tends to wear off. She states that she takes her medication at 7:30 AM and by lunchtime she is starting to feel tired again. Patient states she goes to bed around 9:30 PM. She states that it typically takes her a while to fall asleep. She also has trouble staying asleep. The patient is on CPAP however there is been no download since 2014. I have  asked the patient to bring in her machine and card for [place] to do a download. Her Epworth sleepiness score is 14 was previously 18 and fatigue severity score is 62 was previously 63. She returns today for an evaluation   HISTORY 08/07/14: [name] is a 45 year old female with a history of narcolepsy. She returns today for follow-up. She was prescribed Adderall 20 mg BID. She was taken off Xyrem because she was placed on fentanyl patches for pain control. She reports that her daytime sleepiness has not improved with Adderall. She has been only taking 1 Adderall 20 mg around mid day ( she thought that was how it was ordered). She states that the Adderall helps for several hours after taking it but then it wears off. She states that she is spending more time in the bed rather than up and doing things. She is also not getting restorative sleep which she feels is affecting her fatigue level. Her Epworth is 18 Fatigue severity score is 63.   HISTORY 02/07/14 [facility]): [name] just underwent a gallbladder surgery november 2014 under Dr Rush Farmer after losing 20 pounds with constant nausea, indigestion, abdominal cramping. Meanwhile there was another exploratory surgery April 12th 2015 and was diagnosed with a cyst on the ovary and fallopian tube. The ovary,her last remaining was removed. She has gastroparesis. Dr. Michail Sermon placed her on Reglan and Linsette. She was referred to [facility] for September 2015 She was placed on fentanyl patches for pain control. She  started these after she started [facility] and now reports taking the fentanyl every 2 days instead of every 3 days. This is contraindicated for XYREM users. [name] is here for her regular 6 month revisit for her Xyrem therapy .She has responded very well she is much less excessive daytime sleepy she has much less frequent narcoleptic and cataplectic attacks. As I have described in my past visit note , her MSLT documented a pathological degree of  hypersomnia but was lacking the REM onset naps, which would diagnose narcolepsy. 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 Her response to Xyrem also was extremely preferable.After her last visit I had wanted the patient to be tested by the HDL - narcolepsy factorEpworth 15 on meds, 20 without XYREM and FSS 63 points. Based on her narcotic pain therapy needs, I doubt that her pain management physician sees any alternative to the current regimen.I will not refill her Xyrem she takes 9 g daily divided into doses at night. I will change her to Adderall 20 mg bid po. I will recheck her liver function tests. She was just discharged from the hospital; 6 weeks ago - RV in 6 month with NP Hassell Done for adderall refill, check up- Epworth and FSS, weight , and LFT. Cc [employer]   REVIEW OF SYSTEMS: Out of a complete 14 system review of symptoms, the patient complains only of the following symptoms, and all other reviewed systems are negative.  Activity change, appetite change, chills, fatigue, ringing in ears, runny nose, drooling, shortness of breath, blurred vision, eye redness, eye itching, cold intolerance, flushing, diarrhea, nausea and vomiting restless leg, insomnia, apnea, daytime sleepiness, snoring, aching muscles, muscle cramps, neck pain, neck stiffness, depression, nervous/anxious and hyperactive  ALLERGIES: Allergies  Allergen Reactions  . Adhesive [Tape] Rash    Glue from steri strips  . Aspirin Nausea Only  . Benadryl [Diphenhydramine Hcl] Other (See Comments)    Makes her hyper, jittery  . Codeine Nausea And Vomiting  . Dust Mite Extract Other (See Comments)    Cough, sneeze, eyes runny  . Mold Extract [Trichophyton] Other (See Comments)    Cough, sneeze, eyes/nose runny  . Pollen Extract Other (See Comments)    Cough, sneeze, eyes/nose runny  . Prednisone Other (See Comments)    Makes her hyper, jittery   . Toradol [Ketorolac  Tromethamine] Anxiety  . Zofran [Ondansetron] Nausea And Vomiting    HOME MEDICATIONS: Outpatient Medications Prior to Visit  Medication Sig Dispense Refill  . albuterol (PROVENTIL HFA;VENTOLIN HFA) 108 (90 BASE) MCG/ACT inhaler Inhale 2 puffs into the lungs every 6 (six) hours as needed for wheezing or shortness of breath.    . amphetamine-dextroamphetamine (ADDERALL XR) 20 MG 24 hr capsule Take 1 capsule (20 mg total) by mouth daily. 30 capsule 0  . amphetamine-dextroamphetamine (ADDERALL) 5 MG tablet Take 1 tablet daily at 1PM. 30 tablet 0  . budesonide-formoterol (SYMBICORT) 160-4.5 MCG/ACT inhaler Inhale 2 puffs into the lungs 2 (two) times daily.      . Clobetasol Propionate (CLOBEX) 0.05 % shampoo Apply 1 application topically every Wednesday.     . cycloSPORINE (RESTASIS) 0.05 % ophthalmic emulsion Place 1 drop into both eyes every 12 (twelve) hours.      . diclofenac (VOLTAREN) 50 MG EC tablet Take 1 tablet (50 mg total) by mouth 2 (two) times daily. 60 tablet 0  . dicyclomine (BENTYL) 10 MG capsule Take 10 mg by mouth 4 (  four) times daily as needed for spasms.    Marland Kitchen dronabinol (MARINOL) 2.5 MG capsule TK 1 C PO BID  3  . fentaNYL (DURAGESIC - DOSED MCG/HR) 75 MCG/HR Place 75 mcg onto the skin every 3 (three) days.    Marland Kitchen FLUOCINOLONE ACETONIDE SCALP 0.01 % OIL Apply 1 application topically daily.  0  . fluocinonide (LIDEX) 0.05 % cream Apply 1 application topically 3 (three) times a week.     . gabapentin (NEURONTIN) 300 MG capsule Take 1 capsule (300 mg total) by mouth 3 (three) times daily. 90 capsule 2  . hydrochlorothiazide (HYDRODIURIL) 25 MG tablet Take 25 mg by mouth every morning.     . hydrocortisone (PROCTOCORT) 1 % CREA Apply topically.    Marland Kitchen HYPERCARE 20 % external solution Apply 1 application topically daily.     Marland Kitchen lidocaine (XYLOCAINE) 2 % jelly 1 application by Other route as needed (rectally).    Marland Kitchen LINZESS 290 MCG CAPS capsule Reported on 12/15/2015  3  . LORazepam  (ATIVAN) 0.5 MG tablet Take 0.5 mg by mouth every 6 (six) hours as needed for anxiety.    Marland Kitchen loteprednol (LOTEMAX) 0.2 % SUSP Place 1 drop into both eyes daily.    Marland Kitchen lurasidone (LATUDA) 40 MG TABS Take 40 mg by mouth at bedtime. Reported on 08/28/2015    . MYRBETRIQ 25 MG TB24 tablet TK 1 T PO QD  3  . nitroGLYCERIN (NITROGLYN) 2 % ointment Apply 1 inch topically at bedtime.    . Olopatadine HCl (PATADAY) 0.2 % SOLN Place 1 drop into both eyes 2 (two) times daily as needed (for allergies).     . OnabotulinumtoxinA (BOTOX IJ) Inject as directed every 3 (three) months.    . pantoprazole (PROTONIX) 40 MG tablet Take 1 tablet (40 mg total) by mouth daily. (Patient taking differently: Take 40 mg by mouth 2 (two) times daily. ) 30 tablet 0  . pramipexole (MIRAPEX) 1 MG tablet Take 1 mg by mouth at bedtime.  0  . promethazine (PHENERGAN) 25 MG tablet Take 50 mg by mouth every 6 (six) hours as needed for nausea or vomiting.   3  . PROMETHEGAN 25 MG suppository PLACE 1 SUPPOSITORY RECTALLY Q 6 H PRN N FOR UP TO 7 DAYS  11  . RECTIV 0.4 % OINT APPLY PEA-SIZED AMOUNT TO RECTAL AREA NIGHTLY FOR 14 DAYS FOR FISSURE TREATMENT.  0  . RELPAX 40 MG tablet Take 40 mg by mouth every 2 (two) hours as needed for migraine or headache.     . saccharomyces boulardii (FLORASTOR) 250 MG capsule Take 1 capsule (250 mg total) by mouth 2 (two) times daily. 60 capsule 1  . tiZANidine (ZANAFLEX) 4 MG tablet Take 1 tablet (4 mg total) by mouth every 8 (eight) hours as needed for muscle spasms. 90 tablet 2  . topiramate (TOPAMAX) 100 MG tablet Take 100-200 mg by mouth 2 (two) times daily. 1 tab every AM and 2 at night    . triamcinolone cream (KENALOG) 0.1 % Apply 1 application topically daily as needed (to affected area).     . verapamil (VERELAN PM) 240 MG 24 hr capsule Take 240 mg by mouth at bedtime.      . Vitamin D, Ergocalciferol, (DRISDOL) 50000 UNITS CAPS capsule Take 50,000 Units by mouth 2 (two) times a week.    .  estradiol (ESTRACE) 1 MG tablet TK 1 T PO D  3  . metoCLOPramide (REGLAN) 5 MG tablet Take  1 tablet (5 mg total) by mouth every 6 (six) hours as needed for nausea. (Patient not taking: Reported on 05/25/2016) 30 tablet 0   No facility-administered medications prior to visit.     PAST MEDICAL HISTORY: Past Medical History:  Diagnosis Date  . Allergy   . Anxiety   . Asthma   . Bipolar 1 disorder ([place])   . Chronic lower back pain 02/07/2014  . Depression    excessive daytime sleepiness-MSLT confirmed pathological degree of hypersomnia-12/13  . Esophageal reflux   . Gastroparesis   . Hypersomnia   . Hypertension   . Medication monitoring encounter 02/07/2014  . Migraine   . Migraine   . Migraines   . Narcolepsy   . OCD (obsessive compulsive disorder)   . Opiate use 07/02/2015  . Osteoarthritis    knees, hands, shoulder, lower back  . Other iron deficiency anemias 08/28/2015  . Overactive bladder   . Psychosis   . Restless leg syndrome   . S/P lumbar fusion 2011  . Schizophrenia ([place])   . Schizophrenia, schizo-affective ([place])   . Seasonal allergies   . Sleep apnea    uses CPAP every night  . Torn rotator cuff it:2012,rt:2013    PAST SURGICAL HISTORY: Past Surgical History:  Procedure Laterality Date  . ABDOMINAL HYSTERECTOMY  2001  . ANKLE FUSION     right  . CHOLECYSTECTOMY N/A 07/20/2013   Procedure: LAPAROSCOPIC CHOLECYSTECTOMY;  Surgeon: Harl Bowie, MD;  Location: [place];  Service: General;  Laterality: N/A;  . COLONOSCOPY WITH PROPOFOL N/A 10/02/2013   Procedure: COLONOSCOPY WITH PROPOFOL;  Surgeon: Garlan Fair, MD;  Location: [facility];  Service: Endoscopy;  Laterality: N/A;  . ESOPHAGOGASTRODUODENOSCOPY (EGD) WITH PROPOFOL N/A 10/02/2013   Procedure: ESOPHAGOGASTRODUODENOSCOPY (EGD) WITH PROPOFOL;  Surgeon: Garlan Fair, MD;  Location: [facility];  Service: Endoscopy;  Laterality: N/A;  . FLEXIBLE SIGMOIDOSCOPY Left 04/07/2015   Procedure: FLEXIBLE  SIGMOIDOSCOPY;  Surgeon: Ronald Lobo, MD;  Location: [facility];  Service: Endoscopy;  Laterality: Left;  . FRACTURE SURGERY    . LAPAROSCOPIC SALPINGO OOPHERECTOMY Left 12/05/2013   Procedure: LAPAROSCOPIC SALPINGO OOPHORECTOMY;  Surgeon: Thurnell Lose, MD;  Location: [facility];  Service: Gynecology;  Laterality: Left;  . LUMBAR FUSION  2011   L4-L5  . OOPHORECTOMY   2004   right  . ROTATOR CUFF REPAIR  2012,2013   torn   . SHOULDER SURGERY     left and right , bone spurs, torn muscle  . SPINE SURGERY    . spurs,ruptured disc bone  2009  . TUBAL LIGATION  2004  . VESICOVAGINAL FISTULA CLOSURE W/ TAH  2001  . WISDOM TOOTH EXTRACTION      FAMILY HISTORY: Family History  Problem Relation Age of Onset  . Cancer Mother     breast  . Cancer Father     prostate,colon  . Hypertension Father   . Thyroid disease Father   . High Cholesterol Father   . Thyroid disease Sister   . Cancer Paternal Aunt     colon  . Cancer Maternal Grandmother     kidney  . Cancer Paternal Grandmother     colon  . Cancer Paternal Aunt     lung  . Narcolepsy Neg Hx     SOCIAL HISTORY: Social History   Social History  . Marital status: Married    Spouse name: Elberta Fortis  . Number of children: 3  . Years of education: Master's   Occupational  History  . unemployed   .  Unemployed   Social History Main Topics  . Smoking status: Never Smoker  . Smokeless tobacco: Never Used  . Alcohol use No  . Drug use: No  . Sexual activity: Yes    Birth control/ protection: Surgical   Other Topics Concern  . Not on file   Social History Narrative    This patient has an RDI of 12 and AHI of 4.8,  titrated to 6 cm water after PSG, she did qualify for MSLT, Epworth 22 today on 6 cm water.      Based on her MSLT  of 3.6 minutes without SREM and Epworth of 22 on CPAP with residual AHI of 1.8, she should be treated  in a trial base for narcolepsy. She is on many REM supressant medications, and may not be  able to  express the narcolepsy in an MSLT.   Patient is married Elberta Fortis) and lives at home with her husband and three children.   Patient is currently unemployed.   Patient has a Master's degree.   Patient is right-handed.   Patient drinks two cups of coffee on average but none lately for 6 months, 3 cups of tea daily.      PHYSICAL EXAM  Vitals:   05/25/16 1337  BP: 139/89  Pulse: 82  Weight: 168 lb 6.4 oz (76.4 kg)  Height: 5\' 2"  (1.575 m)   Body mass index is 30.8 kg/m.  Generalized: Well developed, in no acute distress   Neurological examination  Mentation: Alert oriented to time, place, history taking. Follows all commands speech and language fluent Cranial nerve II-XII: Pupils were equal round reactive to light. Extraocular movements were full, visual field were full on confrontational test. Facial sensation and strength were normal. Uvula tongue midline. Head turning and shoulder shrug  were normal and symmetric. Motor: The motor testing reveals 5 over 5 strength of all 4 extremities. Good symmetric motor tone is noted throughout.  Sensory: Sensory testing is intact to soft touch on all 4 extremities. No evidence of extinction is noted.  Coordination: Cerebellar testing reveals good finger-nose-finger and heel-to-shin bilaterally.  Gait and station: Gait is normal. Tandem gait is normal. Romberg is negative. No drift is seen.  Reflexes: Deep tendon reflexes are symmetric and normal bilaterally.   DIAGNOSTIC DATA (LABS, IMAGING, TESTING) - I reviewed patient records, labs, notes, testing and imaging myself where available.  Lab Results  Component Value Date   WBC 5.7 08/28/2015   HGB 10.9 (L) 04/09/2015   HCT 37.3 08/28/2015   MCV 80 08/28/2015   PLT 235 08/28/2015      Component Value Date/Time   NA 138 12/15/2015 1043   NA 143 08/28/2015 1557   K 3.7 12/15/2015 1043   CL 103 12/15/2015 1043   CO2 30 12/15/2015 1043   GLUCOSE 85 12/15/2015 1043   BUN 8  12/15/2015 1043   BUN 7 08/28/2015 1557   CREATININE 0.54 12/15/2015 1043   CALCIUM 9.0 12/15/2015 1043   PROT 7.7 12/15/2015 1043   PROT 7.1 08/28/2015 1557   ALBUMIN 4.2 12/15/2015 1043   ALBUMIN 4.3 08/28/2015 1557   AST 30 12/15/2015 1043   ALT 30 12/15/2015 1043   ALKPHOS 55 12/15/2015 1043   BILITOT 0.4 12/15/2015 1043   BILITOT 0.2 08/28/2015 1557   GFRNONAA >60 12/15/2015 1043   GFRAA >60 12/15/2015 1043      ASSESSMENT AND PLAN 45 y.o. year old female  has  a past medical history of Allergy; Anxiety; Asthma; Bipolar 1 disorder ([place]); Chronic lower back pain (02/07/2014); Depression; Esophageal reflux; Gastroparesis; Hypersomnia; Hypertension; Medication monitoring encounter (02/07/2014); Migraine; Migraine; Migraines; Narcolepsy; OCD (obsessive compulsive disorder); Opiate use (07/02/2015); Osteoarthritis; Other iron deficiency anemias (08/28/2015); Overactive bladder; Psychosis; Restless leg syndrome; S/P lumbar fusion (2011); Schizophrenia ([place]); Schizophrenia, schizo-affective ([place]); Seasonal allergies; Sleep apnea; and Torn rotator cuff (it:2012,rt:2013). here with:  1. Obstructive sleep apnea on CPAP 2. Narcolepsy  The patient has been asked repeatedly to bring her memory card/CPAP machine to the office visits for compliance download. She has not done this. I advised that she needs to bring her memory card by here this week for download in order for [place] to proper treat her fatigue. Also advised that if this is not completed- we may consider not refilling her Adderall in the future. Patient voiced understanding. She will follow-up in 3 months or sooner if needed.   Ward Givens, MSN, NP-C 05/25/2016, 1:58 PM [facility] [address] [place],  [address] [phone]

## 2016-05-26 ENCOUNTER — Encounter: Payer: Self-pay | Admitting: *Deleted

## 2016-05-26 ENCOUNTER — Ambulatory Visit: Payer: Federal, State, Local not specified - PPO | Admitting: Physical Therapy

## 2016-05-26 DIAGNOSIS — M79641 Pain in right hand: Secondary | ICD-10-CM

## 2016-05-26 DIAGNOSIS — M25511 Pain in right shoulder: Secondary | ICD-10-CM | POA: Diagnosis not present

## 2016-05-26 DIAGNOSIS — M79601 Pain in right arm: Secondary | ICD-10-CM | POA: Diagnosis not present

## 2016-05-26 NOTE — Progress Notes (Signed)
Pt dropped off her cpap machine.  SD card downloaded.  Last dates 2013.  I showed to MM/NP and pt to take her machine to [facility] on [address].  Pt states she is using this machine.   She will see them and see what needs to be done.  She took the machine with her.

## 2016-05-26 NOTE — Therapy (Addendum)
[name], [place], [address] Phone: [phone]   Fax:  [phone]  Physical Therapy Treatment/Discharge Summary  Patient Details  Name: [name] MRN: [id number] Date of Birth: Jan 17, 1971 Referring Provider: Esmond Plants, Padilla  Encounter Date: 05/26/2016      PT End of Session - 05/26/16 1420    Visit Number 5   Number of Visits 13   Date for PT Re-Evaluation 06/18/16   Authorization Type [employer] 30 visit limit   PT Start Time 1329   PT Stop Time 1425   PT Time Calculation (min) 56 min   Activity Tolerance Patient tolerated treatment well   Behavior During Therapy [name] for tasks assessed/performed      Past Medical History:  Diagnosis Date  . Allergy   . Anxiety   . Asthma   . Bipolar 1 disorder ([place])   . Chronic lower back pain 02/07/2014  . Depression    excessive daytime sleepiness-MSLT confirmed pathological degree of hypersomnia-12/13  . Esophageal reflux   . Gastroparesis   . Hypersomnia   . Hypertension   . Medication monitoring encounter 02/07/2014  . Migraine   . Migraine   . Migraines   . Narcolepsy   . OCD (obsessive compulsive disorder)   . Opiate use 07/02/2015  . Osteoarthritis    knees, hands, shoulder, lower back  . Other iron deficiency anemias 08/28/2015  . Overactive bladder   . Psychosis   . Restless leg syndrome   . S/P lumbar fusion 2011  . Schizophrenia ([place])   . Schizophrenia, schizo-affective ([place])   . Seasonal allergies   . Sleep apnea    uses CPAP every night  . Torn rotator cuff it:2012,rt:2013    Past Surgical History:  Procedure Laterality Date  . ABDOMINAL HYSTERECTOMY  2001  . ANKLE FUSION     right  . CHOLECYSTECTOMY N/A 07/20/2013   Procedure: LAPAROSCOPIC CHOLECYSTECTOMY;  Surgeon: Courtney Bowie, Padilla;  Location: [facility];  Service: General;  Laterality: N/A;  . COLONOSCOPY WITH PROPOFOL N/A 10/02/2013   Procedure: COLONOSCOPY WITH  PROPOFOL;  Surgeon: Courtney Fair, Padilla;  Location: [facility];  Service: Endoscopy;  Laterality: N/A;  . ESOPHAGOGASTRODUODENOSCOPY (EGD) WITH PROPOFOL N/A 10/02/2013   Procedure: ESOPHAGOGASTRODUODENOSCOPY (EGD) WITH PROPOFOL;  Surgeon: Courtney Fair, Padilla;  Location: [facility];  Service: Endoscopy;  Laterality: N/A;  . FLEXIBLE SIGMOIDOSCOPY Left 04/07/2015   Procedure: FLEXIBLE SIGMOIDOSCOPY;  Surgeon: Courtney Lobo, Padilla;  Location: [facility];  Service: Endoscopy;  Laterality: Left;  . FRACTURE SURGERY    . LAPAROSCOPIC SALPINGO OOPHERECTOMY Left 12/05/2013   Procedure: LAPAROSCOPIC SALPINGO OOPHORECTOMY;  Surgeon: Courtney Lose, Padilla;  Location: [facility];  Service: Gynecology;  Laterality: Left;  . LUMBAR FUSION  2011   L4-L5  . OOPHORECTOMY   2004   right  . ROTATOR CUFF REPAIR  2012,2013   torn   . SHOULDER SURGERY     left and right , bone spurs, torn muscle  . SPINE SURGERY    . spurs,ruptured disc bone  2009  . TUBAL LIGATION  2004  . VESICOVAGINAL FISTULA CLOSURE W/ TAH  2001  . WISDOM TOOTH EXTRACTION      There were no vitals filed for this visit.      Subjective Assessment - 05/26/16 1327    Subjective " I felt pretty sore after the last session that lsat for about 48 hours. The pain isn't as bad but it isn't gone"  Currently in Pain? Yes   Pain Score 8   despite being explained pain tolerance   Pain Location Neck   Pain Orientation Right   Pain Descriptors / Indicators Sore;Pins and needles;Aching   Pain Type Chronic pain   Pain Frequency Constant                         [facility] Adult PT Treatment/Exercise - 05/26/16 0001      Shoulder Exercises: Stretch   Other Shoulder Stretches strap first rib depression     Moist Heat Therapy   Number Minutes Moist Heat 10 Minutes   Moist Heat Location Cervical     Manual Therapy   Manual Therapy Neural Stretch   Joint Mobilization Grade 2-3 C7-C3 R lateral joint opening, Grade 3 R 1st rib  inferior joint mobs   Soft tissue mobilization IASTM over  Rupper trap   Manual Traction manual cervical traction x 5 min   Neural Stretch median nerve   given as HEP          Trigger Point Dry Needling - 05/26/16 1336    Consent Given? Yes   Education Handout Provided Yes  given previously   Muscles Treated Upper Body --  scalenes on the R   Upper Trapezius Response Twitch reponse elicited;Palpable increased muscle length   x 3 with pistoning/ twisting techinque              PT Education - 05/26/16 1420    Education provided Yes   Education Details updated HEP for nerve glides for median nerve   Person(s) Educated Patient   Methods Explanation;Verbal cues   Comprehension Verbalized understanding;Verbal cues required          PT Short Term Goals - 05/04/16 1300      PT SHORT TERM GOAL #1   Title pt will be independent with HEP as it has been established by 9/22   Baseline began establishing at eval   Time 3   Period Weeks   Status New     PT SHORT TERM GOAL #2   Title Pt will demo 5 deg increase in R cervical sidebend   Baseline 10 deg to R   Time 3   Period Weeks   Status New     PT SHORT TERM GOAL #3   Title Verbalize decrease in hand cramping   Baseline frequent at eval   Time 3   Period Weeks   Status New     PT SHORT TERM GOAL #4   Title average pain <7/10   Baseline 9/10 at eval   Time 3   Period Weeks   Status New           PT Long Term Goals - 05/04/16 1303      PT LONG TERM GOAL #1   Title Pt will be able to use R hand to open tight jars by 10/13   Baseline unable at eval   Time 6   Period Weeks   Status New     PT LONG TERM GOAL #2   Title Minimal difficulty with bathing and self care activities   Baseline severe difficulty at eval   Time 6   Period Weeks   Status New     PT LONG TERM GOAL #3   Title Average pain <5/10 to decrease pain effects on daily activities   Baseline 9/10 at eval   Time 6   Period Weeks  Status New     PT LONG TERM GOAL #4   Title FOTO to 64% ability   Baseline 52% ability at eval   Time 6   Period Weeks   Status New               Plan - 05/26/16 1421    Clinical Impression Statement [name] reports that she is still at a 8/10 despite her having improved mobility and decreased tightness since last session. continued DN on the R upper trap and Scalenes, following with IASTM. pt reported decreased pain following throacic mobs and median nerve glides. Post session pt continues to report pain was at 8/10.    PT Next Visit Plan assess response to DN, manual traction, R facet opening, first rib depression, periscapular retraction strength; assess benefit of IFC, manual to right upper trap , TPDN soon.    Consulted and Agree with Plan of Care Patient      Patient will benefit from skilled therapeutic intervention in order to improve the following deficits and impairments:  Impaired UE functional use, Increased muscle spasms, Decreased activity tolerance, Pain, Impaired sensation  Visit Diagnosis: Pain in right shoulder  Pain In Right Arm  Pain in right hand     Problem List Patient Active Problem List   Diagnosis Date Noted  . Neurogenic pain 01/26/2016  . Other iron deficiency anemias 08/28/2015  . Nondiabetic gastroparesis 08/28/2015  . Chronic lower back pain (L>R) (Primary Pain) 07/02/2015  . Medication monitoring encounter 07/02/2015  . Chronic pain 07/02/2015  . Opiate use 07/02/2015  . Long term prescription opiate use 07/02/2015  . Long term current use of opiate analgesic 07/02/2015  . Encounter for therapeutic drug level monitoring 07/02/2015  . 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 Epidural fibrosis 07/02/2015  . Chronic lumbar radicular pain (Bilateral) (L>R) 07/02/2015  . Bilateral lower extremity pain (L>R) 07/02/2015  . Drug tolerance to opioids 07/02/2015  . Opiate analgesic contract exists 07/02/2015  . Lumbar  spondylosis 07/02/2015  . Lumbar facet hypertrophy 07/02/2015  . Grade 1 Retrolisthesis of L1 over L2 07/02/2015  . Grade 1 Anterolisthesis of L5 over S1 07/02/2015  . Lumbar facet arthropathy 07/02/2015  . 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 Enteritis 04/06/2015  . Gastroparesis due to DM ([place])   . Colitis, acute 04/04/2015  . Dental infection 04/04/2015  . Nausea with vomiting   . Leukocytosis 04/03/2015  . Nausea and vomiting 04/02/2015  . Nausea vomiting and diarrhea 04/02/2015  . Abdominal pain 04/02/2015  . Extrinsic asthma 04/02/2015  . Chronic cholecystitis 07/02/2013  . 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 UARS (upper airway resistance syndrome) 02/14/2013  . Dyspnea 06/09/2011   Starr Lake PT, DPT, LAT, ATC  05/26/16  3:30 PM      [facility] [address] [place], [place], [address] Phone: [phone]   Fax:  [phone]  Name: [name] MRN: [id number] Date of Birth: 1971-07-30  PHYSICAL THERAPY DISCHARGE SUMMARY  Visits from Start of Care: 5  Current functional level related to goals / functional outcomes: See above   Remaining deficits: See above   Education / Equipment: Anatomy of condition, POC, HEP, exercise form/rationale  Plan: Patient agrees to discharge.  Patient goals were not met. Patient is being discharged due to not returning since the last visit.  ?????     Jessica C. Hightower PT, DPT 06/17/16 11:04 AM

## 2016-05-27 DIAGNOSIS — M542 Cervicalgia: Secondary | ICD-10-CM | POA: Diagnosis not present

## 2016-05-27 DIAGNOSIS — M25511 Pain in right shoulder: Secondary | ICD-10-CM | POA: Diagnosis not present

## 2016-05-31 ENCOUNTER — Ambulatory Visit: Payer: Federal, State, Local not specified - PPO | Admitting: Physical Therapy

## 2016-06-02 ENCOUNTER — Encounter: Payer: Federal, State, Local not specified - PPO | Admitting: Physical Therapy

## 2016-06-02 DIAGNOSIS — K5909 Other constipation: Secondary | ICD-10-CM | POA: Diagnosis not present

## 2016-06-03 ENCOUNTER — Ambulatory Visit: Payer: Federal, State, Local not specified - PPO | Admitting: Physical Therapy

## 2016-06-05 DIAGNOSIS — M542 Cervicalgia: Secondary | ICD-10-CM | POA: Diagnosis not present

## 2016-06-08 DIAGNOSIS — G43009 Migraine without aura, not intractable, without status migrainosus: Secondary | ICD-10-CM | POA: Diagnosis not present

## 2016-06-08 DIAGNOSIS — G43719 Chronic migraine without aura, intractable, without status migrainosus: Secondary | ICD-10-CM | POA: Diagnosis not present

## 2016-06-10 DIAGNOSIS — M50123 Cervical disc disorder at C6-C7 level with radiculopathy: Secondary | ICD-10-CM | POA: Diagnosis not present

## 2016-06-10 DIAGNOSIS — M50122 Cervical disc disorder at C5-C6 level with radiculopathy: Secondary | ICD-10-CM | POA: Diagnosis not present

## 2016-06-25 DIAGNOSIS — M50122 Cervical disc disorder at C5-C6 level with radiculopathy: Secondary | ICD-10-CM | POA: Diagnosis not present

## 2016-06-25 DIAGNOSIS — M50123 Cervical disc disorder at C6-C7 level with radiculopathy: Secondary | ICD-10-CM | POA: Diagnosis not present

## 2016-06-25 DIAGNOSIS — M5412 Radiculopathy, cervical region: Secondary | ICD-10-CM | POA: Diagnosis not present

## 2016-06-28 ENCOUNTER — Other Ambulatory Visit: Payer: Self-pay | Admitting: Family Medicine

## 2016-06-28 DIAGNOSIS — Z1231 Encounter for screening mammogram for malignant neoplasm of breast: Secondary | ICD-10-CM

## 2016-06-30 ENCOUNTER — Other Ambulatory Visit: Payer: Self-pay | Admitting: Adult Health

## 2016-06-30 DIAGNOSIS — E8941 Symptomatic postprocedural ovarian failure: Secondary | ICD-10-CM | POA: Diagnosis not present

## 2016-06-30 DIAGNOSIS — Z79890 Hormone replacement therapy (postmenopausal): Secondary | ICD-10-CM | POA: Diagnosis not present

## 2016-06-30 NOTE — Telephone Encounter (Signed)
I have spoken with Ruhama.  She sts. she has spoken with [facility] and they told her she will need to send her CPAP machine in to a different location to have her machine evaluated. Sts. she was given an 800 # to call to arrange to have machine serviced, but when she called this # she was told they don't repair machines. She has not pursued getting machine serviced beyond that. I have explained that without a download, Megan/Dr. D are unable to be sure CPAP is controlling her OSA.  She verbalized understanding of same, sts. she will follow up on this tomorrow/fim

## 2016-06-30 NOTE — Telephone Encounter (Signed)
Pt request refill for amphetamine-dextroamphetamine (ADDERALL XR) 20 MG 24 hr capsule and amphetamine-dextroamphetamine (ADDERALL) 5 MG tablet

## 2016-07-01 ENCOUNTER — Encounter: Payer: Self-pay | Admitting: Neurology

## 2016-07-02 DIAGNOSIS — M5012 Mid-cervical disc disorder, unspecified level: Secondary | ICD-10-CM | POA: Diagnosis not present

## 2016-07-02 DIAGNOSIS — M961 Postlaminectomy syndrome, not elsewhere classified: Secondary | ICD-10-CM | POA: Diagnosis not present

## 2016-07-07 ENCOUNTER — Ambulatory Visit
Admission: RE | Admit: 2016-07-07 | Discharge: 2016-07-07 | Disposition: A | Discharge status: Home / Self Care | Payer: Federal, State, Local not specified - PPO | Source: Ambulatory Visit | Attending: Family Medicine | Admitting: Family Medicine

## 2016-07-07 ENCOUNTER — Telehealth: Payer: Self-pay | Admitting: Neurology

## 2016-07-07 DIAGNOSIS — Z1231 Encounter for screening mammogram for malignant neoplasm of breast: Secondary | ICD-10-CM

## 2016-07-07 NOTE — Telephone Encounter (Signed)
I believe [name] was working on this. Pt hasn't been using her cpap for the last 8 months but used it one night recently and [name] was able to get that download of 1 night. Will fax the download tomorrow morning.

## 2016-07-07 NOTE — Telephone Encounter (Signed)
[name] Christy/[facility] [phone] or cell [phone] called in reference to CPAP, states we tried to download in our office and machine wasn't downloading. [name] advised, the machine is downloading, patient hasn't been using for the past 8 months, machine worked for him the night she used it, even gave patient a Designer, multimedia in case her machine didn't work. Will fax the (1) day download tomorrow morning. Also advised of new fax number [phone].

## 2016-07-12 NOTE — Telephone Encounter (Signed)
This download is only for one day? 07/01/16.  Can her DME do a 30 day download?

## 2016-07-12 NOTE — Telephone Encounter (Signed)
Not until she uses it for  30 days.   Apt with Dr. Brett Fairy 08-24-16.

## 2016-07-12 NOTE — Telephone Encounter (Signed)
Received fax.  Will forward to MM/NP.

## 2016-07-12 NOTE — Telephone Encounter (Signed)
I called and he will fax to [phone] (pharmacy).   States that did download, but also left her loaner machine as well.  He did not think she was using machine.

## 2016-07-13 NOTE — Telephone Encounter (Signed)
Noted. Patient has not been using the machine. A compliance download will be obtained at the next office visit. At that time her adderall may be resumed.

## 2016-07-15 DIAGNOSIS — M50122 Cervical disc disorder at C5-C6 level with radiculopathy: Secondary | ICD-10-CM | POA: Diagnosis not present

## 2016-07-15 DIAGNOSIS — M50123 Cervical disc disorder at C6-C7 level with radiculopathy: Secondary | ICD-10-CM | POA: Diagnosis not present

## 2016-07-20 DIAGNOSIS — R198 Other specified symptoms and signs involving the digestive system and abdomen: Secondary | ICD-10-CM | POA: Diagnosis not present

## 2016-07-20 DIAGNOSIS — N3946 Mixed incontinence: Secondary | ICD-10-CM | POA: Diagnosis not present

## 2016-07-20 DIAGNOSIS — M6289 Other specified disorders of muscle: Secondary | ICD-10-CM | POA: Diagnosis not present

## 2016-07-20 DIAGNOSIS — K5900 Constipation, unspecified: Secondary | ICD-10-CM | POA: Diagnosis not present

## 2016-07-20 DIAGNOSIS — K5901 Slow transit constipation: Secondary | ICD-10-CM | POA: Diagnosis not present

## 2016-08-02 DIAGNOSIS — G43719 Chronic migraine without aura, intractable, without status migrainosus: Secondary | ICD-10-CM | POA: Diagnosis not present

## 2016-08-03 DIAGNOSIS — M50123 Cervical disc disorder at C6-C7 level with radiculopathy: Secondary | ICD-10-CM | POA: Diagnosis not present

## 2016-08-04 DIAGNOSIS — K5900 Constipation, unspecified: Secondary | ICD-10-CM | POA: Diagnosis not present

## 2016-08-04 DIAGNOSIS — M6289 Other specified disorders of muscle: Secondary | ICD-10-CM | POA: Diagnosis not present

## 2016-08-04 DIAGNOSIS — N3946 Mixed incontinence: Secondary | ICD-10-CM | POA: Diagnosis not present

## 2016-08-04 DIAGNOSIS — K5901 Slow transit constipation: Secondary | ICD-10-CM | POA: Diagnosis not present

## 2016-08-04 DIAGNOSIS — R198 Other specified symptoms and signs involving the digestive system and abdomen: Secondary | ICD-10-CM | POA: Diagnosis not present

## 2016-08-18 DIAGNOSIS — M6289 Other specified disorders of muscle: Secondary | ICD-10-CM | POA: Diagnosis not present

## 2016-08-18 DIAGNOSIS — K5900 Constipation, unspecified: Secondary | ICD-10-CM | POA: Diagnosis not present

## 2016-08-18 DIAGNOSIS — R198 Other specified symptoms and signs involving the digestive system and abdomen: Secondary | ICD-10-CM | POA: Diagnosis not present

## 2016-08-18 DIAGNOSIS — K5901 Slow transit constipation: Secondary | ICD-10-CM | POA: Diagnosis not present

## 2016-08-18 DIAGNOSIS — N3946 Mixed incontinence: Secondary | ICD-10-CM | POA: Diagnosis not present

## 2016-08-24 ENCOUNTER — Encounter: Payer: Self-pay | Admitting: Neurology

## 2016-08-24 ENCOUNTER — Ambulatory Visit (INDEPENDENT_AMBULATORY_CARE_PROVIDER_SITE_OTHER): Payer: Federal, State, Local not specified - PPO | Admitting: Neurology

## 2016-08-24 VITALS — BP 128/78 | HR 82 | Resp 16 | Ht 62.0 in | Wt 172.0 lb

## 2016-08-24 DIAGNOSIS — G4733 Obstructive sleep apnea (adult) (pediatric): Secondary | ICD-10-CM | POA: Diagnosis not present

## 2016-08-24 DIAGNOSIS — Z9989 Dependence on other enabling machines and devices: Secondary | ICD-10-CM

## 2016-08-24 DIAGNOSIS — K50014 Crohn's disease of small intestine with abscess: Secondary | ICD-10-CM | POA: Diagnosis not present

## 2016-08-24 DIAGNOSIS — G47419 Narcolepsy without cataplexy: Secondary | ICD-10-CM

## 2016-08-24 MED ORDER — AMPHETAMINE-DEXTROAMPHETAMINE 10 MG PO TABS: ORAL_TABLET | ORAL | 0 refills | Status: DC

## 2016-08-24 NOTE — Progress Notes (Signed)
PATIENT: [name] DOB: November 03, 1970  REASON FOR VISIT: follow up- narcolepsy, sleep apnea HISTORY FROM: patient  HISTORY OF PRESENT ILLNESS:   [name] has been established patient in our sleep clinic and carries a diagnosis of narcolepsy as well as sleep apnea. [name] compliant CPAP user followed by [facility], out of [place], [place]. [name] average AHI on CPAP was 1.1 at a setting of only 6 cm water. However she had not the ability to store therapeutic data on [name] current CPAP machine, and a loaner machine was given to [name] for 10 days. She has 100% compliance for 10 days, with an average user time of 3 hours and 2 minutes of sleep.  She promised improvement, using the machine during naps from now on Adderall has been beneficial , but she still naps. There going to change [name] Adderall XR 20 mg today to 2 of 10 mg tablets, and she will keep the 10 mg dose at lunchtime. Total dose will not exceed 30 mg.  The Epworth score was endorsed at 16 points, fatigue severity was endorsed at 61 points close to the maximum of 63.   [name] is a 45 year old female with a history of narcolepsy and sleep apnea. She returns today for follow-up. The patient has again not brought [name] card with [name] to this visit. She states that she is more fatigued. She continues Adderall XR release 20 mg in the morning and Adderall immediate release lunchtime 65m at lunchtime. She reports that she does find this beneficial. She returns today for an evaluation.    HISTORY  02/23/2016 :[name] a 45year old female with a history of narcolepsy and sleep apnea. She returns today for follow-up. At the last visit [name] Adderall was adjusted. She is taking Adderall extended release 20 mg in the morning and Adderall immediate release 10 mg at lunchtime if needed. She reports that this has been helpful although she still gets sleepy later in the afternoons. The patient did bring [name] card with [name] today however she  states that when she removed the card from [name] device she noticed that it was not pressed and all the way. We are unable to obtain a download today. It appears that the patient has not had a download since 2013. The patient's Epworth sleepiness score is 14 and fatigue severity score is 56. She returns today for evaluation.   HISTORY 02/07/14 ([facility]: [name] underwent a gallbladder surgery november 2014 under Dr BRush Farmerafter losing 20 pounds with constant nausea, indigestion, abdominal cramping. Meanwhile there was another exploratory surgery April 12th 2015 and was diagnosed with a cyst on the ovary and fallopian tube. The ovary,[name] last remaining was removed. She has gastroparesis. Dr. SMichail Sermonplaced [name] on Reglan and Linsette. [facility] She was placed on fentanyl patches for pain control. She started these after [facility]and now reports taking the fentanyl every 2 days instead of every 3 days. This is contraindicated for XYREM users. [name] here for [name] regular 6 month revisit for [name] Xyrem therapy .She has responded very well she is much less excessive daytime sleepy she has much less frequent narcoleptic and cataplectic attacks. As I have described in my past visit note , [name] MSLT documented a pathological degree of hypersomnia but was lacking the REM onset naps, which would diagnose narcolepsy. 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  [name] response to Xyrem also was extremely preferable.After [name] last visit I had wanted the patient to be tested by the HDL -  narcolepsy factorEpworth 15 on meds, 20 without XYREM and FSS 63 points. Based on [name] narcotic pain therapy needs, I doubt that [name] pain management physician sees any alternative to the current regimen.I will not refill [name] Xyrem she takes 9 g daily divided into doses at night. I will change [name] to Adderall 20 mg bid po. I will  recheck [name] liver function tests. She was just discharged from the hospital; 6 weeks ago - RV in 6 month with NP Hassell Done for adderall refill, check up- Epworth and FSS, weight , and LFT. Cc [employer]   REVIEW OF SYSTEMS: Out of a complete 14 system review of symptoms, the patient complains only of the following symptoms, and all other reviewed systems are negative.  Activity change, appetite change, chills, fatigue, ringing in ears, runny nose, drooling, shortness of breath, blurred vision, eye redness, eye itching, cold intolerance, flushing, diarrhea, nausea and vomiting restless leg, insomnia, apnea, daytime sleepiness, snoring, aching muscles, muscle cramps, neck pain, neck stiffness, depression, nervous/anxious and hyperactive  ALLERGIES: Allergies  Allergen Reactions  . Adhesive [Tape] Rash    Glue from steri strips  . Aspirin Nausea Only  . Benadryl [Diphenhydramine Hcl] Other (See Comments)    Makes [name] hyper, jittery  . Codeine Nausea And Vomiting  . Dust Mite Extract Other (See Comments)    Cough, sneeze, eyes runny  . Mold Extract [Trichophyton] Other (See Comments)    Cough, sneeze, eyes/nose runny  . Pollen Extract Other (See Comments)    Cough, sneeze, eyes/nose runny  . Prednisone Other (See Comments)    Makes [name] hyper, jittery   . Toradol [Ketorolac Tromethamine] Anxiety  . Zofran [Ondansetron] Nausea And Vomiting    HOME MEDICATIONS: Outpatient Medications Prior to Visit  Medication Sig Dispense Refill  . albuterol (PROVENTIL HFA;VENTOLIN HFA) 108 (90 BASE) MCG/ACT inhaler Inhale 2 puffs into the lungs every 6 (six) hours as needed for wheezing or shortness of breath.    . amphetamine-dextroamphetamine (ADDERALL XR) 20 MG 24 hr capsule Take 1 capsule (20 mg total) by mouth daily. 30 capsule 0  . amphetamine-dextroamphetamine (ADDERALL) 5 MG tablet Take 1 tablet daily at 1PM. 30 tablet 0  . budesonide-formoterol (SYMBICORT) 160-4.5 MCG/ACT inhaler Inhale 2  puffs into the lungs 2 (two) times daily.      . Clobetasol Propionate (CLOBEX) 0.05 % shampoo Apply 1 application topically every Wednesday.     . cycloSPORINE (RESTASIS) 0.05 % ophthalmic emulsion Place 1 drop into both eyes every 12 (twelve) hours.      . diclofenac (VOLTAREN) 50 MG EC tablet Take 1 tablet (50 mg total) by mouth 2 (two) times daily. 60 tablet 0  . dicyclomine (BENTYL) 10 MG capsule Take 10 mg by mouth 4 (four) times daily as needed for spasms.    Marland Kitchen dronabinol (MARINOL) 2.5 MG capsule TK 1 C PO BID  3  . fentaNYL (DURAGESIC - DOSED MCG/HR) 75 MCG/HR Place 75 mcg onto the skin every 3 (three) days.    Marland Kitchen FLUOCINOLONE ACETONIDE SCALP 0.01 % OIL Apply 1 application topically daily.  0  . fluocinonide (LIDEX) 0.05 % cream Apply 1 application topically 3 (three) times a week.     . gabapentin (NEURONTIN) 300 MG capsule Take 1 capsule (300 mg total) by mouth 3 (three) times daily. 90 capsule 2  . hydrochlorothiazide (HYDRODIURIL)  25 MG tablet Take 25 mg by mouth every morning.     . hydrocortisone (PROCTOCORT) 1 % CREA Apply topically.    . lidocaine (XYLOCAINE) 2 % jelly 1 application by Other route as needed (rectally).    Marland Kitchen LINZESS 290 MCG CAPS capsule Reported on 12/15/2015  3  . LORazepam (ATIVAN) 0.5 MG tablet Take 0.5 mg by mouth every 6 (six) hours as needed for anxiety.    Marland Kitchen loteprednol (LOTEMAX) 0.2 % SUSP Place 1 drop into both eyes daily.    Marland Kitchen lurasidone (LATUDA) 40 MG TABS Take 40 mg by mouth at bedtime. Reported on 08/28/2015    . MYRBETRIQ 25 MG TB24 tablet TK 1 T PO QD  3  . nitroGLYCERIN (NITROGLYN) 2 % ointment Apply 1 inch topically at bedtime.    . Olopatadine HCl (PATADAY) 0.2 % SOLN Place 1 drop into both eyes 2 (two) times daily as needed (for allergies).     . OnabotulinumtoxinA (BOTOX IJ) Inject as directed every 3 (three) months.    . pantoprazole (PROTONIX) 40 MG tablet Take 1 tablet (40 mg total) by mouth daily. (Patient taking differently: Take 40 mg by  mouth 2 (two) times daily. ) 30 tablet 0  . pramipexole (MIRAPEX) 1 MG tablet Take 1 mg by mouth at bedtime.  0  . PREMARIN 1.25 MG tablet     . promethazine (PHENERGAN) 25 MG tablet Take 50 mg by mouth every 6 (six) hours as needed for nausea or vomiting.   3  . PROMETHEGAN 25 MG suppository PLACE 1 SUPPOSITORY RECTALLY Q 6 H PRN N FOR UP TO 7 DAYS  11  . RECTIV 0.4 % OINT APPLY PEA-SIZED AMOUNT TO RECTAL AREA NIGHTLY FOR 14 DAYS FOR FISSURE TREATMENT.  0  . RELPAX 40 MG tablet Take 40 mg by mouth every 2 (two) hours as needed for migraine or headache.     . saccharomyces boulardii (FLORASTOR) 250 MG capsule Take 1 capsule (250 mg total) by mouth 2 (two) times daily. 60 capsule 1  . tiZANidine (ZANAFLEX) 4 MG tablet Take 1 tablet (4 mg total) by mouth every 8 (eight) hours as needed for muscle spasms. 90 tablet 2  . topiramate (TOPAMAX) 100 MG tablet Take 100-200 mg by mouth 2 (two) times daily. 1 tab every AM and 2 at night    . triamcinolone cream (KENALOG) 0.1 % Apply 1 application topically daily as needed (to affected area).     . verapamil (VERELAN PM) 240 MG 24 hr capsule Take 240 mg by mouth at bedtime.      . Vitamin D, Ergocalciferol, (DRISDOL) 50000 UNITS CAPS capsule Take 50,000 Units by mouth 2 (two) times a week.    Marland Kitchen HYPERCARE 20 % external solution Apply 1 application topically daily.      No facility-administered medications prior to visit.     PAST MEDICAL HISTORY: Past Medical History:  Diagnosis Date  . Allergy   . Anxiety   . Asthma   . Bipolar 1 disorder ([place])   . Chronic lower back pain 02/07/2014  . Depression    excessive daytime sleepiness-MSLT confirmed pathological degree of hypersomnia-12/13  . Esophageal reflux   . Gastroparesis   . Hypersomnia   . Hypertension   . Medication monitoring encounter 02/07/2014  . Migraine   . Migraine   . Migraines   . Narcolepsy   . OCD (obsessive compulsive disorder)   . Opiate use 07/02/2015  . Osteoarthritis  knees, hands, shoulder, lower back  . Other iron deficiency anemias 08/28/2015  . Overactive Padilla   . Psychosis   . Restless leg syndrome   . S/P lumbar fusion 2011  . Schizophrenia ([place])   . Schizophrenia, schizo-affective ([place])   . Seasonal allergies   . Sleep apnea    uses CPAP every night  . Torn rotator cuff it:2012,rt:2013    PAST SURGICAL HISTORY: Past Surgical History:  Procedure Laterality Date  . ABDOMINAL HYSTERECTOMY  2001  . ANKLE FUSION     right  . CHOLECYSTECTOMY N/A 07/20/2013   Procedure: LAPAROSCOPIC CHOLECYSTECTOMY;  Surgeon: Harl Bowie, MD;  Location: [facility];  Service: General;  Laterality: N/A;  . COLONOSCOPY WITH PROPOFOL N/A 10/02/2013   Procedure: COLONOSCOPY WITH PROPOFOL;  Surgeon: Garlan Fair, MD;  Location: [facility];  Service: Endoscopy;  Laterality: N/A;  . ESOPHAGOGASTRODUODENOSCOPY (EGD) WITH PROPOFOL N/A 10/02/2013   Procedure: ESOPHAGOGASTRODUODENOSCOPY (EGD) WITH PROPOFOL;  Surgeon: Garlan Fair, MD;  Location: [facility];  Service: Endoscopy;  Laterality: N/A;  . FLEXIBLE SIGMOIDOSCOPY Left 04/07/2015   Procedure: FLEXIBLE SIGMOIDOSCOPY;  Surgeon: Ronald Lobo, MD;  Location[facility];  Service: Endoscopy;  Laterality: Left;  . FRACTURE SURGERY    . LAPAROSCOPIC SALPINGO OOPHERECTOMY Left 12/05/2013   Procedure: LAPAROSCOPIC SALPINGO OOPHORECTOMY;  Surgeon: Thurnell Lose, MD;  Location: [facility];  Service: Gynecology;  Laterality: Left;  . LUMBAR FUSION  2011   L4-L5  . OOPHORECTOMY   2004   right  . ROTATOR CUFF REPAIR  2012,2013   torn   . SHOULDER SURGERY     left and right , bone spurs, torn muscle  . SPINE SURGERY    . spurs,ruptured disc bone  2009  . TUBAL LIGATION  2004  . VESICOVAGINAL FISTULA CLOSURE W/ TAH  2001  . WISDOM TOOTH EXTRACTION      FAMILY HISTORY: Family History  Problem Relation Age of Onset  . Cancer Mother     breast  . Cancer Father     prostate,colon  . Hypertension Father   .  Thyroid disease Father   . High Cholesterol Father   . Thyroid disease Sister   . Cancer Paternal Aunt     colon  . Cancer Maternal Grandmother     kidney  . Cancer Paternal Grandmother     colon  . Cancer Paternal Aunt     lung  . Narcolepsy Neg Hx     SOCIAL HISTORY: Social History   Social History  . Marital status: Married    Spouse name: Elberta Fortis  . Number of children: 3  . Years of education: Master's   Occupational History  . unemployed   .  Unemployed   Social History Main Topics  . Smoking status: Never Smoker  . Smokeless tobacco: Never Used  . Alcohol use No  . Drug use: No  . Sexual activity: Yes    Birth control/ protection: Surgical   Other Topics Concern  . Not on file   Social History Narrative    This patient has an RDI of 12 and AHI of 4.8,  titrated to 6 cm water after PSG, she did qualify for MSLT, Epworth 22 today on 6 cm water.      Based on [name] MSLT  of 3.6 minutes without SREM and Epworth of 22 on CPAP with residual AHI of 1.8, she should be treated  in a trial base for narcolepsy. She is on many REM supressant medications,  and may not be able to  express the narcolepsy in an MSLT.   Patient is married Elberta Fortis) and lives at home with [name] husband and three children.   Patient is currently unemployed.   Patient has a Master's degree.   Patient is right-handed.   Patient drinks two cups of coffee on average but none lately for 6 months, 3 cups of tea daily.      PHYSICAL EXAM  Vitals:   08/24/16 1317  BP: 128/78  Pulse: 82  Resp: 16  Weight: 172 lb (78 kg)  Height: _0  (1.575 m)   Body mass index is 31.46 kg/m.  Generalized: Well developed, in no acute distress   Neurological examination  Mentation: Alert oriented to time, place, history taking. Follows all commands speech and language fluent Cranial nerve II-XII: Pupils were equal round reactive to light. Extraocular movements were full, visual field were full on  confrontational test. Facial sensation and strength were normal. Uvula tongue midline. Head turning and shoulder shrug  were normal and symmetric. Motor: The motor testing reveals 5 over 5 strength of all 4 extremities. Good symmetric motor tone is noted throughout.  Sensory: Sensory testing is intact to soft touch on all 4 extremities. No evidence of extinction is noted.  Coordination: Cerebellar testing reveals good finger-nose-finger and heel-to-shin bilaterally.  Gait and station: Gait is normal. Tandem gait is normal. Romberg is negative. No drift is seen.  Reflexes: Deep tendon reflexes are symmetric and normal bilaterally.   DIAGNOSTIC DATA (LABS, IMAGING, TESTING) - I reviewed patient records, labs, notes, testing and imaging myself where available.  Patient is on Fentanyl and cannot use XYREM for this reason.   Lab Results  Component Value Date   WBC 5.7 08/28/2015   HGB 10.9 (L) 04/09/2015   HCT 37.3 08/28/2015   MCV 80 08/28/2015   PLT 235 08/28/2015      Component Value Date/Time   NA 138 12/15/2015 1043   NA 143 08/28/2015 1557   K 3.7 12/15/2015 1043   CL 103 12/15/2015 1043   CO2 30 12/15/2015 1043   GLUCOSE 85 12/15/2015 1043   BUN 8 12/15/2015 1043   BUN 7 08/28/2015 1557   CREATININE 0.54 12/15/2015 1043   CALCIUM 9.0 12/15/2015 1043   PROT 7.7 12/15/2015 1043   PROT 7.1 08/28/2015 1557   ALBUMIN 4.2 12/15/2015 1043   ALBUMIN 4.3 08/28/2015 1557   AST 30 12/15/2015 1043   ALT 30 12/15/2015 1043   ALKPHOS 55 12/15/2015 1043   BILITOT 0.4 12/15/2015 1043   BILITOT 0.2 08/28/2015 1557   GFRNONAA >60 12/15/2015 1043   GFRAA >60 12/15/2015 1043      ASSESSMENT AND PLAN 45 y.o. year old female  has a past medical history of Allergy; Anxiety; Asthma; Bipolar 1 disorder ([place]); Chronic lower back pain (02/07/2014); Depression; Esophageal reflux; Gastroparesis; Hypersomnia; Hypertension; Medication monitoring encounter (02/07/2014); Migraine; Migraine; Migraines;  Narcolepsy; OCD (obsessive compulsive disorder); Opiate use (07/02/2015); Osteoarthritis; Other iron deficiency anemias (08/28/2015); Overactive Padilla; Psychosis; Restless leg syndrome; S/P lumbar fusion (2011); Schizophrenia ([place]); Schizophrenia, schizo-affective ([place]); Seasonal allergies; Sleep apnea; and Torn rotator cuff (it:2012,rt:2013). here with:  1. Obstructive sleep apnea on CPAP 2. Narcolepsy, HLA positive, On Adderall- changed to 10 mg tab Adderall IR three a day.  3 chronic pain on Fentanyl  The patient has been asked repeatedly to bring [name] memory card/CPAP machine to the office visits for compliance download. She has not done this. I advised that she  needs to bring [name] memory card by here this week for download in order for [place] to proper treat [name] fatigue. Also advised that if this is not completed- we may consider not refilling [name] Adderall in the future. Patient voiced understanding. She will follow-up in 3 months or sooner if needed.   Ancel Easler, MD  08/24/2016, 1:38 PM [facility] [address] [place] [employer], [place] [address] [phone]

## 2016-08-25 DIAGNOSIS — N3946 Mixed incontinence: Secondary | ICD-10-CM | POA: Diagnosis not present

## 2016-08-25 DIAGNOSIS — R198 Other specified symptoms and signs involving the digestive system and abdomen: Secondary | ICD-10-CM | POA: Diagnosis not present

## 2016-08-25 DIAGNOSIS — K5900 Constipation, unspecified: Secondary | ICD-10-CM | POA: Diagnosis not present

## 2016-08-25 DIAGNOSIS — M6289 Other specified disorders of muscle: Secondary | ICD-10-CM | POA: Diagnosis not present

## 2016-08-25 DIAGNOSIS — K5901 Slow transit constipation: Secondary | ICD-10-CM | POA: Diagnosis not present

## 2016-08-31 DIAGNOSIS — B373 Candidiasis of vulva and vagina: Secondary | ICD-10-CM | POA: Diagnosis not present

## 2016-09-07 DIAGNOSIS — M5032 Other cervical disc degeneration, mid-cervical region, unspecified level: Secondary | ICD-10-CM | POA: Diagnosis not present

## 2016-09-07 DIAGNOSIS — M5012 Mid-cervical disc disorder, unspecified level: Secondary | ICD-10-CM | POA: Diagnosis not present

## 2016-09-09 ENCOUNTER — Telehealth: Payer: Self-pay

## 2016-09-09 NOTE — Telephone Encounter (Signed)
PA done with [facility] for Adderall 10mg , 3 tab a day. Approval was given to [place] today, once faxed confirmation is sent to [place] we will fax to [facility]. Aprroved 07/11/2016-09/09/2017

## 2016-09-10 DIAGNOSIS — M5013 Cervical disc disorder with radiculopathy, cervicothoracic region: Secondary | ICD-10-CM | POA: Diagnosis not present

## 2016-09-10 DIAGNOSIS — M5012 Mid-cervical disc disorder, unspecified level: Secondary | ICD-10-CM | POA: Diagnosis not present

## 2016-09-10 DIAGNOSIS — M50123 Cervical disc disorder at C6-C7 level with radiculopathy: Secondary | ICD-10-CM | POA: Diagnosis not present

## 2016-09-10 DIAGNOSIS — M4802 Spinal stenosis, cervical region: Secondary | ICD-10-CM | POA: Diagnosis not present

## 2016-09-14 NOTE — Telephone Encounter (Signed)
This encounter was created in error - please disregard.

## 2016-10-01 ENCOUNTER — Telehealth: Payer: Self-pay | Admitting: Neurology

## 2016-10-01 NOTE — Telephone Encounter (Signed)
Pt request refill for amphetamine-dextroamphetamine (ADDERALL) 10 MG tablet . The pt is aware the office closes at noon today and was advised to call back on Monday to check for status of refill.

## 2016-10-04 DIAGNOSIS — M5012 Mid-cervical disc disorder, unspecified level: Secondary | ICD-10-CM | POA: Diagnosis not present

## 2016-10-05 ENCOUNTER — Other Ambulatory Visit: Payer: Self-pay

## 2016-10-05 DIAGNOSIS — G47419 Narcolepsy without cataplexy: Secondary | ICD-10-CM

## 2016-10-05 DIAGNOSIS — K50014 Crohn's disease of small intestine with abscess: Secondary | ICD-10-CM

## 2016-10-05 DIAGNOSIS — Z9989 Dependence on other enabling machines and devices: Secondary | ICD-10-CM

## 2016-10-05 DIAGNOSIS — G4733 Obstructive sleep apnea (adult) (pediatric): Secondary | ICD-10-CM

## 2016-10-05 MED ORDER — AMPHETAMINE-DEXTROAMPHETAMINE 10 MG PO TABS
ORAL_TABLET | ORAL | 0 refills | Status: DC
Start: 2016-10-05 — End: 2016-11-19

## 2016-10-05 NOTE — Telephone Encounter (Signed)
Pt's representative showed up today to pick up adderall RX. RX printed and given to Dr. Brett Fairy to sign. Dr. Brett Fairy signed RX.  Signed in RX to front desk.

## 2016-10-06 DIAGNOSIS — R1084 Generalized abdominal pain: Secondary | ICD-10-CM | POA: Diagnosis not present

## 2016-10-06 DIAGNOSIS — R110 Nausea: Secondary | ICD-10-CM | POA: Diagnosis not present

## 2016-10-06 DIAGNOSIS — K5900 Constipation, unspecified: Secondary | ICD-10-CM | POA: Diagnosis not present

## 2016-10-06 DIAGNOSIS — R112 Nausea with vomiting, unspecified: Secondary | ICD-10-CM | POA: Diagnosis not present

## 2016-10-07 DIAGNOSIS — E8941 Symptomatic postprocedural ovarian failure: Secondary | ICD-10-CM | POA: Diagnosis not present

## 2016-10-28 ENCOUNTER — Ambulatory Visit: Payer: Federal, State, Local not specified - PPO | Attending: Orthopaedic Surgery | Admitting: Physical Therapy

## 2016-10-28 DIAGNOSIS — M542 Cervicalgia: Secondary | ICD-10-CM | POA: Diagnosis not present

## 2016-10-28 DIAGNOSIS — R29898 Other symptoms and signs involving the musculoskeletal system: Secondary | ICD-10-CM | POA: Insufficient documentation

## 2016-10-28 NOTE — Patient Instructions (Signed)
    Head Press With [place] chin SLIGHTLY toward chest, keep mouth closed. Feel weight on back of head. Increase weight by pressing head down. Hold __5-10_ seconds. Relax. Repeat _10__ times. Surface: floor   Copyright  VHI. All rights reserved.  Stretch Break - Chin Tuck    Looking straight forward, tuck chin and hold __5-10__ seconds. Relax and return to starting position. Repeat ___10_ times every _2-3_ hours.  Copyright  VHI. All rights reserved.   Copyright  VHI. All rights reserved.  Scapular Retraction (Standing)   With arms at sides, pinch shoulder blades together. Repeat _10___ times per set. Do __1-2__ sets per session. Do ___2_ sessions per day.  http://orth.exer.us/944   Copyright  VHI. All rights reserved.    AROM: Neck Rotation    Turn head slowly to look over one shoulder, then the other. Hold each position __10-15__ seconds. Repeat ___5-10_ times per set. Do ___1-2_ sets per session. Do _2___ sessions per day.  http://orth.exer.us/295   Copyright  VHI. All rights reserved.

## 2016-10-28 NOTE — Therapy (Signed)
[place], [place], [address] Phone: [phone]   Fax:  [phone]  Physical Therapy Evaluation  Patient Details  Name: [name] MRN: [id number] Date of Birth: [birth year] Referring Provider: Dr. Melina Schools  Encounter Date: 10/28/2016      PT End of Session - 10/28/16 2153    Visit Number 1   Number of Visits 16   Date for PT Re-Evaluation 12/23/16   PT Start Time 1500   PT Stop Time 1543   PT Time Calculation (min) 43 min   Activity Tolerance Patient tolerated treatment well   Behavior During Therapy [facility] for tasks assessed/performed      Past Medical History:  Diagnosis Date  . Allergy   . Anxiety   . Asthma   . Bipolar 1 disorder ([place])   . Chronic lower back pain 02/07/2014  . Depression    excessive daytime sleepiness-MSLT confirmed pathological degree of hypersomnia-12/13  . Esophageal reflux   . Gastroparesis   . Hypersomnia   . Hypertension   . Medication monitoring encounter 02/07/2014  . Migraine   . Migraine   . Migraines   . Narcolepsy   . OCD (obsessive compulsive disorder)   . Opiate use 07/02/2015  . Osteoarthritis    knees, hands, shoulder, lower back  . Other iron deficiency anemias 08/28/2015  . Overactive bladder   . Psychosis   . Restless leg syndrome   . S/P lumbar fusion 2011  . Schizophrenia ([place])   . Schizophrenia, schizo-affective ([place])   . Seasonal allergies   . Sleep apnea    uses CPAP every night  . Torn rotator cuff it:2012,rt:2013    Past Surgical History:  Procedure Laterality Date  . ABDOMINAL HYSTERECTOMY  2001  . ANKLE FUSION     right  . CHOLECYSTECTOMY N/A 07/20/2013   Procedure: LAPAROSCOPIC CHOLECYSTECTOMY;  Surgeon: Harl Bowie, MD;  Location: [place];  Service: General;  Laterality: N/A;  . COLONOSCOPY WITH PROPOFOL N/A 10/02/2013   Procedure: COLONOSCOPY WITH PROPOFOL;  Surgeon: Garlan Fair, MD;  Location: [facility];   Service: Endoscopy;  Laterality: N/A;  . ESOPHAGOGASTRODUODENOSCOPY (EGD) WITH PROPOFOL N/A 10/02/2013   Procedure: ESOPHAGOGASTRODUODENOSCOPY (EGD) WITH PROPOFOL;  Surgeon: Garlan Fair, MD;  [facility]: [facility];  Service: Endoscopy;  Laterality: N/A;  . FLEXIBLE SIGMOIDOSCOPY Left 04/07/2015   Procedure: FLEXIBLE SIGMOIDOSCOPY;  Surgeon: Ronald Lobo, MD;  Location: [facility];  Service: Endoscopy;  Laterality: Left;  . FRACTURE SURGERY    . LAPAROSCOPIC SALPINGO OOPHERECTOMY Left 12/05/2013   Procedure: LAPAROSCOPIC SALPINGO OOPHORECTOMY;  Surgeon: Thurnell Lose, MD;  Location: [facility];  Service: Gynecology;  Laterality: Left;  . LUMBAR FUSION  2011   L4-L5  . OOPHORECTOMY   2004   right  . ROTATOR CUFF REPAIR  2012,2013   torn   . SHOULDER SURGERY     left and right , bone spurs, torn muscle  . SPINE SURGERY    . spurs,ruptured disc bone  2009  . TUBAL LIGATION  2004  . VESICOVAGINAL FISTULA CLOSURE W/ TAH  2001  . WISDOM TOOTH EXTRACTION      There were no vitals filed for this visit.       Subjective Assessment - 10/28/16 1505    Subjective Pt underwent cervical Disc replacement by Melina Schools on 09/10/16.   She had PT this past Fall 2017 for her pain without lasting benefit, other conservative measures failed.   Patient has  some difficulty with sleeping, using her Rt. hand.  She has fatigue and bilateral shoulder and neck pain.   She has neck stiffness.  She is pleased with the outcome of her surgery.     Pertinent History C5-C6-C7 Disc surgery.  Lumbar fusions , chronic pain, OCD, HTN, ankle fusion, shoulder surgery.    Limitations Lifting;Sitting;Reading;Writing;House hold activities;Other (comment)  sleep positioning   Diagnostic tests MRI not available.    Patient Stated Goals Pt would like to be able to get back to 100% with Rt hand.     Currently in Pain? Yes   Pain Score 7    Pain Location Neck   Pain Orientation Right;Left;Lower   Pain Descriptors /  Indicators Aching;Sore;Tightness   Pain Type Surgical pain   Pain Radiating Towards shoulder blades    Pain Onset More than a month ago   Pain Frequency Constant   Aggravating Factors  reading, lifting, squatting, bending   Pain Relieving Factors meds, patch ,ice, heat    Effect of Pain on Daily Activities makes everything difficult            [facility] PT Assessment - 10/28/16 1514      Assessment   Medical Diagnosis Cervical disc replacement    Referring Provider Dr. Melina Schools   Onset Date/Surgical Date 09/10/16   Hand Dominance Right   Next MD Visit unknown   Prior Therapy Yes      Precautions   Precautions None   Precaution Comments wears cervical collar in car      Restrictions   Weight Bearing Restrictions No   Other Position/Activity Restrictions MD advised only 30 min outings     Balance Screen   Has the patient fallen in the past 6 months Yes   How many times? 1   Has the patient had a decrease in activity level because of a fear of falling?  Yes   Is the patient reluctant to leave their home because of a fear of falling?  Yes  mostly due to fatigue      [place] residence   Living Arrangements Spouse/significant other;Children   Type of [place]     Prior Function   Level of [place] Unemployed   Leisure family, church      Cognition   Overall Cognitive Status Within Functional Limits for tasks assessed     Observation/Other Assessments   Focus on Therapeutic Outcomes (FOTO)  60%     Sensation   Light Touch Appears Intact     Posture/Postural Control   Posture/Postural Control Postural limitations   Postural Limitations Forward head     AROM   Cervical Flexion 30   Cervical Extension 35   Cervical - Right Side Bend 26   Cervical - Left Side Bend 26   Cervical - Right Rotation 50   Cervical - Left Rotation 54     Strength   Right Shoulder Flexion 4/5   Right Shoulder  ABduction 4/5   Left Shoulder Flexion 4+/5   Left Shoulder ABduction 4+/5   Right/Left hand Right;Left   Right Hand Grip (lbs) 21, 27, 25 lbs    Left Hand Grip (lbs) 40, 38, 26 lbs      Palpation   Palpation comment pain bilateral posterior cervicals and TTP along bilateral upper traps  PT Education - 10/28/16 2152    Education provided Yes   Education Details PT/POC, HEP, stability   Person(s) Educated Patient   Methods Explanation;Demonstration;Handout   Comprehension Verbalized understanding;Returned demonstration;Need further instruction          PT Short Term Goals - 10/28/16 2201      PT SHORT TERM GOAL #1   Title pt will be independent with simple HEP    Time 4   Period Weeks   Status New     PT SHORT TERM GOAL #2   Title Pt will be able to go on an outing for 30-45 min with min fatigue   Time 4   Period Weeks   Status New     PT SHORT TERM GOAL #3   Title Pt will be able to perform grooming, ADLs without increasing neck pain.    Time 4   Period Weeks   Status New           PT Long Term Goals - 10/28/16 2207      PT LONG TERM GOAL #1   Title Pt will have normal grip strength equal to that of L hand (or more).    Baseline 34 L and 24 R    Time 8   Period Weeks   Status New     PT LONG TERM GOAL #2   Title Pt will be able to demo full AROM in all planes without pain.    Time 8   Period Weeks   Status New     PT LONG TERM GOAL #3   Title Pt will be I with concepts of posture, lifting and body mechanics to prevent further injury.    Time 8   Period Weeks   Status New     PT LONG TERM GOAL #4   Title FOTO score will improve to 50% impaired or less to demo overall functional improvement.    Baseline 66%   Time 8   Period Weeks   Status New               Plan - 10/28/16 2154    Clinical Impression Statement Patient presents for low complexity eval for post operative pain in cervical  spine.  No operative report available. Per patient she hd cervical disc replacement in C5-C6-C7.  For this reason, today's eval was simple and did not offer treatment beyond simple chin tuck and education on posture.  Please send a protocol and operative report if available to guide in POC. Had a mild headache after session.  She now has more muscular pain and soreness that will likely resolve with corrective exercise and proper movement patterning.    Rehab Potential Excellent   PT Frequency 2x / week   PT Duration 8 weeks   PT Treatment/Interventions ADLs/Self Care Home Management;Moist Heat;Taping;Passive range of motion;Functional mobility training;Electrical Stimulation;Cryotherapy;Patient/family education;Neuromuscular re-education;Manual techniques;Therapeutic exercise;Ultrasound   PT Next Visit Plan check HEP, posture, try UBE and supine stab. , modalities prn, gentle light AROM (no overstretching)    PT Home Exercise Plan chin tuck, scapualr retraction, gentle rotation   Consulted and Agree with Plan of Care Patient      Patient will benefit from skilled therapeutic intervention in order to improve the following deficits and impairments:  Decreased strength, Pain, Impaired flexibility, Impaired UE functional use, Increased fascial restricitons, Decreased range of motion  Visit Diagnosis: Cervicalgia  Other symptoms and signs involving the musculoskeletal system     Problem  List Patient Active Problem List   Diagnosis Date Noted  . Neurogenic pain 01/26/2016  . Other iron deficiency anemias 08/28/2015  . Nondiabetic gastroparesis 08/28/2015  . Chronic lower back pain (L>R) (Primary Pain) 07/02/2015  . Medication monitoring encounter 07/02/2015  . Chronic pain 07/02/2015  . Opiate use 07/02/2015  . Long term prescription opiate use 07/02/2015  . Long term current use of opiate analgesic 07/02/2015  . Encounter for therapeutic drug level monitoring 07/02/2015  . Failed back  surgical syndrome (x2) 07/02/2015  . Epidural fibrosis 07/02/2015  . Chronic lumbar radicular pain (Bilateral) (L>R) 07/02/2015  . Bilateral lower extremity pain (L>R) 07/02/2015  . Drug tolerance to opioids 07/02/2015  . Opiate analgesic contract exists 07/02/2015  . Lumbar spondylosis 07/02/2015  . Lumbar facet hypertrophy 07/02/2015  . Grade 1 Retrolisthesis of L1 over L2 07/02/2015  . Grade 1 Anterolisthesis of L5 over S1 07/02/2015  . Lumbar facet arthropathy 07/02/2015  . 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 Enteritis 04/06/2015  . Gastroparesis due to DM ([place])   . Colitis, acute 04/04/2015  . Dental infection 04/04/2015  . Nausea with vomiting   . Leukocytosis 04/03/2015  . Nausea and vomiting 04/02/2015  . Nausea vomiting and diarrhea 04/02/2015  . Abdominal pain 04/02/2015  . Extrinsic asthma 04/02/2015  . Chronic cholecystitis 07/02/2013  . 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 UARS (upper airway resistance syndrome) 02/14/2013  . Dyspnea 06/09/2011    PAA,JENNIFER 10/28/2016, 10:14 PM  [facility] [address] [place], [place], [address] Phone: [phone]   Fax:  [phone]  Name: KHALIL BRUMLEVE MRN: [id number] Date of Birth: [birth year]   Raeford Razor, PT 10/28/16 10:14 PM Phone: [phone] Fax: [phone]

## 2016-11-02 ENCOUNTER — Ambulatory Visit: Payer: Federal, State, Local not specified - PPO | Admitting: Physical Therapy

## 2016-11-02 DIAGNOSIS — R29898 Other symptoms and signs involving the musculoskeletal system: Secondary | ICD-10-CM | POA: Diagnosis not present

## 2016-11-02 DIAGNOSIS — M542 Cervicalgia: Secondary | ICD-10-CM | POA: Diagnosis not present

## 2016-11-02 NOTE — Patient Instructions (Signed)
Over Head Pull: Narrow Grip       On back, knees bent, feet flat, band across thighs, elbows straight but relaxed. Pull hands apart (start). Keeping elbows straight, bring arms up and over head, hands toward floor. Keep pull steady on band. Hold momentarily. Return slowly, keeping pull steady, back to start. Repeat __10_ times. Band color ____Y__   Side Pull: Double Arm   On back, knees bent, feet flat. Arms perpendicular to body, shoulder level, elbows straight but relaxed. Pull arms out to sides, elbows straight. Resistance band comes across collarbones, hands toward floor. Hold momentarily. Slowly return to starting position. Repeat _10__ times. Band color __Y___     Shoulder Rotation: Double Arm   On back, knees bent, feet flat, elbows tucked at sides, bent 90, hands palms up. Pull hands apart and down toward floor, keeping elbows near sides. Hold momentarily. Slowly return to starting position. Repeat _10__ times. Band color __Y____

## 2016-11-02 NOTE — Therapy (Signed)
[place], [place], [address] Phone: [phone]   Fax:  [phone]  Physical Therapy Treatment  Patient Details  Name: [name] MRN: [id number] Date of Birth: [birth year] Referring Provider: Dr. Melina Schools  Encounter Date: 11/02/2016      PT End of Session - 11/02/16 1510    Visit Number 2   Number of Visits 16   Date for PT Re-Evaluation 12/23/16   PT Start Time 0300   PT Stop Time 0348   PT Time Calculation (min) 48 min      Past Medical History:  Diagnosis Date  . Allergy   . Anxiety   . Asthma   . Bipolar 1 disorder ([place])   . Chronic lower back pain 02/07/2014  . Depression    excessive daytime sleepiness-MSLT confirmed pathological degree of hypersomnia-12/13  . Esophageal reflux   . Gastroparesis   . Hypersomnia   . Hypertension   . Medication monitoring encounter 02/07/2014  . Migraine   . Migraine   . Migraines   . Narcolepsy   . OCD (obsessive compulsive disorder)   . Opiate use 07/02/2015  . Osteoarthritis    knees, hands, shoulder, lower back  . Other iron deficiency anemias 08/28/2015  . Overactive bladder   . Psychosis   . Restless leg syndrome   . S/P lumbar fusion 2011  . Schizophrenia ([place])   . Schizophrenia, schizo-affective ([place])   . Seasonal allergies   . Sleep apnea    uses CPAP every night  . Torn rotator cuff it:2012,rt:2013    Past Surgical History:  Procedure Laterality Date  . ABDOMINAL HYSTERECTOMY  2001  . ANKLE FUSION     right  . CHOLECYSTECTOMY N/A 07/20/2013   Procedure: LAPAROSCOPIC CHOLECYSTECTOMY;  Surgeon: Harl Bowie, MD;  Location: [place];  Service: General;  Laterality: N/A;  . COLONOSCOPY WITH PROPOFOL N/A 10/02/2013   Procedure: COLONOSCOPY WITH PROPOFOL;  Surgeon: Garlan Fair, MD;  [facility];  Service: Endoscopy;  Laterality: N/A;  . ESOPHAGOGASTRODUODENOSCOPY (EGD) WITH PROPOFOL N/A 10/02/2013    Procedure: ESOPHAGOGASTRODUODENOSCOPY (EGD) WITH PROPOFOL;  Surgeon: Garlan Fair, MD;  [facility];  Service: Endoscopy;  Laterality: N/A;  . FLEXIBLE SIGMOIDOSCOPY Left 04/07/2015   Procedure: FLEXIBLE SIGMOIDOSCOPY;  Surgeon: Ronald Lobo, MD;  Location: [facility]COPY;  Service: Endoscopy;  Laterality: Left;  . FRACTURE SURGERY    . LAPAROSCOPIC SALPINGO OOPHERECTOMY Left 12/05/2013   Procedure: LAPAROSCOPIC SALPINGO OOPHORECTOMY;  Surgeon: Thurnell Lose, MD;  Location: [facility];  Service: Gynecology;  Laterality: Left;  . LUMBAR FUSION  2011   L4-L5  . OOPHORECTOMY   2004   right  . ROTATOR CUFF REPAIR  2012,2013   torn   . SHOULDER SURGERY     left and right , bone spurs, torn muscle  . SPINE SURGERY    . spurs,ruptured disc bone  2009  . TUBAL LIGATION  2004  . VESICOVAGINAL FISTULA CLOSURE W/ TAH  2001  . WISDOM TOOTH EXTRACTION      There were no vitals filed for this visit.      Subjective Assessment - 11/02/16 1503    Subjective Really sore, tired and tense after prolonged outing yesterday for funeral.    Currently in Pain? Yes   Pain Score 7    Pain Location Neck   Pain Orientation Posterior;Distal  and upper back    Pain Descriptors / Indicators Sore;Aching;Tightness   Pain Type Surgical  pain   Pain Radiating Towards shoulder blades                         [facility] Adult PT Treatment/Exercise - 11/02/16 0001      Neck Exercises: Seated   Neck Retraction 10 reps   Neck Retraction Limitations cues required for correct form   Cervical Rotation 10 reps   Other Seated Exercise scap retrcaction   Other Seated Exercise towl squeeze sec x 10 for grip      Neck Exercises: Supine   Neck Retraction 10 reps   Other Supine Exercise supine yellow scap stab horiz abdct, ER, pullovers x 10 each      Shoulder Exercises: ROM/Strengthening   UBE (Upper Arm Bike) L1 minutes forward , 2 minutes backward      Modalities   Modalities Moist Heat      Moist Heat Therapy   Number Minutes Moist Heat 15 Minutes   Moist Heat Location Cervical                PT Education - 11/02/16 1534    Education provided Yes   Education Details HEP   Person(s) Educated Patient   Methods Explanation;Handout   Comprehension Verbalized understanding          PT Short Term Goals - 10/28/16 2201      PT SHORT TERM GOAL #1   Title pt will be independent with simple HEP    Time 4   Period Weeks   Status New     PT SHORT TERM GOAL #2   Title Pt will be able to go on an outing for 30-45 min with min fatigue   Time 4   Period Weeks   Status New     PT SHORT TERM GOAL #3   Title Pt will be able to perform grooming, ADLs without increasing neck pain.    Time 4   Period Weeks   Status New           PT Long Term Goals - 10/28/16 2207      PT LONG TERM GOAL #1   Title Pt will have normal grip strength equal to that of L hand (or more).    Baseline 34 L and 24 R    Time 8   Period Weeks   Status New     PT LONG TERM GOAL #2   Title Pt will be able to demo full AROM in all planes without pain.    Time 8   Period Weeks   Status New     PT LONG TERM GOAL #3   Title Pt will be I with concepts of posture, lifting and body mechanics to prevent further injury.    Time 8   Period Weeks   Status New     PT LONG TERM GOAL #4   Title FOTO score will improve to 50% impaired or less to demo overall functional improvement.    Baseline 66%   Time 8   Period Weeks   Status New               Plan - 11/02/16 1534    Clinical Impression Statement Pt is independent with initial HEP. Began UBE and supine stabilization per POC. Pt reports right hand/wrist fatgiue due to gripping theraband for extended period of time. Made loops/handles on theraband to decrease strain on hand. HMP at end of session. No increased pain, only muscle  fatigue.    PT Next Visit Plan assess response to treatment, check HEP, posture, cont UBE and review  supine stab (add sash). , modalities prn, gentle light AROM (no overstretching) grip stretngth    PT Home Exercise Plan chin tuck, scapualr retraction, gentle rotation, yellow band scap stab (except sash)   Consulted and Agree with Plan of Care Patient      Patient will benefit from skilled therapeutic intervention in order to improve the following deficits and impairments:  Decreased strength, Pain, Impaired flexibility, Impaired UE functional use, Increased fascial restricitons, Decreased range of motion  Visit Diagnosis: Cervicalgia  Other symptoms and signs involving the musculoskeletal system     Problem List Patient Active Problem List   Diagnosis Date Noted  . Neurogenic pain 01/26/2016  . Other iron deficiency anemias 08/28/2015  . Nondiabetic gastroparesis 08/28/2015  . Chronic lower back pain (L>R) (Primary Pain) 07/02/2015  . Medication monitoring encounter 07/02/2015  . Chronic pain 07/02/2015  . Opiate use 07/02/2015  . Long term prescription opiate use 07/02/2015  . Long term current use of opiate analgesic 07/02/2015  . Encounter for therapeutic drug level monitoring 07/02/2015  . 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 Epidural fibrosis 07/02/2015  . Chronic lumbar radicular pain (Bilateral) (L>R) 07/02/2015  . Bilateral lower extremity pain (L>R) 07/02/2015  . Drug tolerance to opioids 07/02/2015  . Opiate analgesic contract exists 07/02/2015  . Lumbar spondylosis 07/02/2015  . Lumbar facet hypertrophy 07/02/2015  . Grade 1 Retrolisthesis of L1 over L2 07/02/2015  . Grade 1 Anterolisthesis of L5 over S1 07/02/2015  . Lumbar facet arthropathy 07/02/2015  . 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 Enteritis 04/06/2015  . Gastroparesis due to DM ([place])   . Colitis, acute 04/04/2015  . Dental infection 04/04/2015  . Nausea with vomiting   . Leukocytosis 04/03/2015  . Nausea and vomiting 04/02/2015  . Nausea vomiting and diarrhea 04/02/2015  . Abdominal pain  04/02/2015  . Extrinsic asthma 04/02/2015  . Chronic cholecystitis 07/02/2013  . 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 UARS (upper airway resistance syndrome) 02/14/2013  . Dyspnea 06/09/2011    Dorene Ar, PTA 11/02/2016, 3:56 PM  [facility] [address] [place], [place], [address] Phone: [phone]   Fax:  [phone]  Name: [name] MRN: [id number] Date of Birth: [birth year]

## 2016-11-03 DIAGNOSIS — I10 Essential (primary) hypertension: Secondary | ICD-10-CM | POA: Diagnosis not present

## 2016-11-03 DIAGNOSIS — N3281 Overactive bladder: Secondary | ICD-10-CM | POA: Diagnosis not present

## 2016-11-04 ENCOUNTER — Ambulatory Visit: Payer: Federal, State, Local not specified - PPO | Attending: Orthopaedic Surgery | Admitting: Physical Therapy

## 2016-11-04 DIAGNOSIS — M542 Cervicalgia: Secondary | ICD-10-CM | POA: Insufficient documentation

## 2016-11-04 DIAGNOSIS — M79641 Pain in right hand: Secondary | ICD-10-CM | POA: Insufficient documentation

## 2016-11-04 DIAGNOSIS — M79601 Pain in right arm: Secondary | ICD-10-CM | POA: Diagnosis not present

## 2016-11-04 DIAGNOSIS — R29898 Other symptoms and signs involving the musculoskeletal system: Secondary | ICD-10-CM | POA: Diagnosis not present

## 2016-11-04 DIAGNOSIS — M25511 Pain in right shoulder: Secondary | ICD-10-CM | POA: Diagnosis not present

## 2016-11-04 NOTE — Therapy (Signed)
[place], [place], [address] Phone: [phone]   Fax:  [phone]  Physical Therapy Treatment  Patient Details  Name: [name] MRN: [id number] Date of Birth: [birth year] Referring Provider: Dr. Melina Schools  Encounter Date: 11/04/2016      PT End of Session - 11/04/16 1018    Visit Number 3   Number of Visits 16   Date for PT Re-Evaluation 12/23/16   PT Start Time [id number]   PT Stop Time 1115   PT Time Calculation (min) 60 min      Past Medical History:  Diagnosis Date  . Allergy   . Anxiety   . Asthma   . Bipolar 1 disorder ([place])   . Chronic lower back pain 02/07/2014  . Depression    excessive daytime sleepiness-MSLT confirmed pathological degree of hypersomnia-12/13  . Esophageal reflux   . Gastroparesis   . Hypersomnia   . Hypertension   . Medication monitoring encounter 02/07/2014  . Migraine   . Migraine   . Migraines   . Narcolepsy   . OCD (obsessive compulsive disorder)   . Opiate use 07/02/2015  . Osteoarthritis    knees, hands, shoulder, lower back  . Other iron deficiency anemias 08/28/2015  . Overactive bladder   . Psychosis   . Restless leg syndrome   . S/P lumbar fusion 2011  . Schizophrenia ([place])   . Schizophrenia, schizo-affective ([place])   . Seasonal allergies   . Sleep apnea    uses CPAP every night  . Torn rotator cuff it:2012,rt:2013    Past Surgical History:  Procedure Laterality Date  . ABDOMINAL HYSTERECTOMY  2001  . ANKLE FUSION     right  . CHOLECYSTECTOMY N/A 07/20/2013   Procedure: LAPAROSCOPIC CHOLECYSTECTOMY;  Surgeon: Harl Bowie, MD;  Location: [facility];  Service: General;  Laterality: N/A;  . COLONOSCOPY WITH PROPOFOL N/A 10/02/2013   Procedure: COLONOSCOPY WITH PROPOFOL;  Surgeon: Garlan Fair, MD;  Location: [facility];  Service: Endoscopy;  Laterality: N/A;  . ESOPHAGOGASTRODUODENOSCOPY (EGD) WITH PROPOFOL N/A 10/02/2013   Procedure:  ESOPHAGOGASTRODUODENOSCOPY (EGD) WITH PROPOFOL;  Surgeon: Garlan Fair, MD;  Location: [facility];  Service: Endoscopy;  Laterality: N/A;  . FLEXIBLE SIGMOIDOSCOPY Left 04/07/2015   Procedure: FLEXIBLE SIGMOIDOSCOPY;  Surgeon: Ronald Lobo, MD;  Location: [facility];  Service: Endoscopy;  Laterality: Left;  . FRACTURE SURGERY    . LAPAROSCOPIC SALPINGO OOPHERECTOMY Left 12/05/2013   Procedure: LAPAROSCOPIC SALPINGO OOPHORECTOMY;  Surgeon: Thurnell Lose, MD;  Location: [facility];  Service: Gynecology;  Laterality: Left;  . LUMBAR FUSION  2011   L4-L5  . OOPHORECTOMY   2004   right  . ROTATOR CUFF REPAIR  2012,2013   torn   . SHOULDER SURGERY     left and right , bone spurs, torn muscle  . SPINE SURGERY    . spurs,ruptured disc bone  2009  . TUBAL LIGATION  2004  . VESICOVAGINAL FISTULA CLOSURE W/ TAH  2001  . WISDOM TOOTH EXTRACTION      There were no vitals filed for this visit.      Subjective Assessment - 11/04/16 1019    Subjective I hope It is not cold in here like last time. I was freezing and it made me achey.    Currently in Pain? Yes   Pain Score 7    Pain Location Neck   Pain Orientation Posterior;Distal   Pain Descriptors / Indicators Sore;Aching;Tightness  Aggravating Factors  reading, lifting, squatting, bending   Pain Relieving Factors meds, heat                          [facility] Adult PT Treatment/Exercise - 11/04/16 0001      Neck Exercises: Seated   Neck Retraction 10 reps   Cervical Rotation 10 reps   Lateral Flexion 5 reps   Other Seated Exercise scap retrcaction     Neck Exercises: Supine   Neck Retraction 10 reps   Other Supine Exercise supine yellow scap stab horiz abdct, ER, pullovers x 10 each   added sashes, use black PT handles for grip comfort     Shoulder Exercises: ROM/Strengthening   UBE (Upper Arm Bike) L1 3 minutes forward , 3 minutes backward      Modalities   Modalities Electrical Stimulation     Moist Heat  Therapy   Number Minutes Moist Heat 15 Minutes   Moist Heat Location Cervical     Electrical Stimulation   Electrical Stimulation Location posterior neck    Electrical Stimulation Action IFC x 15 minutes   Electrical Stimulation Parameters 52ma   Electrical Stimulation Goals Pain     Manual Therapy   Manual Therapy Soft tissue mobilization   Soft tissue mobilization cervical paraspinals, upper traps, bilateral                   PT Short Term Goals - 10/28/16 2201      PT SHORT TERM GOAL #1   Title pt will be independent with simple HEP    Time 4   Period Weeks   Status New     PT SHORT TERM GOAL #2   Title Pt will be able to go on an outing for 30-45 min with min fatigue   Time 4   Period Weeks   Status New     PT SHORT TERM GOAL #3   Title Pt will be able to perform grooming, ADLs without increasing neck pain.    Time 4   Period Weeks   Status New           PT Long Term Goals - 10/28/16 2207      PT LONG TERM GOAL #1   Title Pt will have normal grip strength equal to that of L hand (or more).    Baseline 34 L and 24 R    Time 8   Period Weeks   Status New     PT LONG TERM GOAL #2   Title Pt will be able to demo full AROM in all planes without pain.    Time 8   Period Weeks   Status New     PT LONG TERM GOAL #3   Title Pt will be I with concepts of posture, lifting and body mechanics to prevent further injury.    Time 8   Period Weeks   Status New     PT LONG TERM GOAL #4   Title FOTO score will improve to 50% impaired or less to demo overall functional improvement.    Baseline 66%   Time 8   Period Weeks   Status New               Plan - 11/04/16 1104    Clinical Impression Statement Pt reports continued 7/10 pain psterior neck. SHe moves well with exercises and does not c/o increased pain during treatement. She has a  TENS unit however does not have electrodes. Information given on where to purchase. Performed IFC to decrease  neck pain today.    PT Next Visit Plan assess response to treatment, check HEP, posture, cont UBE and review supine stab (add sash). , modalities prn, gentle light AROM (no overstretching) grip stretngth    PT Home Exercise Plan chin tuck, scapualr retraction, gentle rotation, yellow band scap stab (except sash)   Consulted and Agree with Plan of Care Patient      Patient will benefit from skilled therapeutic intervention in order to improve the following deficits and impairments:  Decreased strength, Pain, Impaired flexibility, Impaired UE functional use, Increased fascial restricitons, Decreased range of motion  Visit Diagnosis: Cervicalgia  Other symptoms and signs involving the musculoskeletal system     Problem List Patient Active Problem List   Diagnosis Date Noted  . Neurogenic pain 01/26/2016  . Other iron deficiency anemias 08/28/2015  . Nondiabetic gastroparesis 08/28/2015  . Chronic lower back pain (L>R) (Primary Pain) 07/02/2015  . Medication monitoring encounter 07/02/2015  . Chronic pain 07/02/2015  . Opiate use 07/02/2015  . Long term prescription opiate use 07/02/2015  . Long term current use of opiate analgesic 07/02/2015  . Encounter for therapeutic drug level monitoring 07/02/2015  . 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 Epidural fibrosis 07/02/2015  . Chronic lumbar radicular pain (Bilateral) (L>R) 07/02/2015  . Bilateral lower extremity pain (L>R) 07/02/2015  . Drug tolerance to opioids 07/02/2015  . Opiate analgesic contract exists 07/02/2015  . Lumbar spondylosis 07/02/2015  . Lumbar facet hypertrophy 07/02/2015  . Grade 1 Retrolisthesis of L1 over L2 07/02/2015  . Grade 1 Anterolisthesis of L5 over S1 07/02/2015  . Lumbar facet arthropathy 07/02/2015  . 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 Enteritis 04/06/2015  . Gastroparesis due to DM ([place])   . Colitis, acute 04/04/2015  . Dental infection 04/04/2015  . Nausea with vomiting   . Leukocytosis  04/03/2015  . Nausea and vomiting 04/02/2015  . Nausea vomiting and diarrhea 04/02/2015  . Abdominal pain 04/02/2015  . Extrinsic asthma 04/02/2015  . Chronic cholecystitis 07/02/2013  . 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 UARS (upper airway resistance syndrome) 02/14/2013  . Dyspnea 06/09/2011    Dorene Ar, PTA 11/04/2016, 11:05 AM  [facility] [address] [place], [place], [address] Phone: [phone]   Fax:  [phone]  Name: REDONNA SCHATZ MRN: [id number] Date of Birth: Mar 07, 1971

## 2016-11-05 DIAGNOSIS — M5012 Mid-cervical disc disorder, unspecified level: Secondary | ICD-10-CM | POA: Diagnosis not present

## 2016-11-08 ENCOUNTER — Ambulatory Visit: Payer: Federal, State, Local not specified - PPO | Admitting: Physical Therapy

## 2016-11-08 DIAGNOSIS — M542 Cervicalgia: Secondary | ICD-10-CM | POA: Diagnosis not present

## 2016-11-08 DIAGNOSIS — R29898 Other symptoms and signs involving the musculoskeletal system: Secondary | ICD-10-CM

## 2016-11-08 DIAGNOSIS — M79601 Pain in right arm: Secondary | ICD-10-CM

## 2016-11-08 DIAGNOSIS — M25511 Pain in right shoulder: Secondary | ICD-10-CM

## 2016-11-08 DIAGNOSIS — M79641 Pain in right hand: Secondary | ICD-10-CM | POA: Diagnosis not present

## 2016-11-08 NOTE — Therapy (Addendum)
[place], [place], [address] Phone: [phone]   Fax:  [phone]  Physical Therapy Treatment, Addendum Discharge  Patient Details  Name: [name] MRN: [id number] Date of Birth: November 04, 1970 Referring Provider: Dr. Melina Schools  Encounter Date: 11/08/2016      PT End of Session - 11/08/16 1423    Visit Number 4   Number of Visits 16   Date for PT Re-Evaluation 12/23/16   PT Start Time 1332   PT Stop Time 1430   PT Time Calculation (min) 58 min   Activity Tolerance Patient tolerated treatment well   Behavior During Therapy [facility] for tasks assessed/performed      Past Medical History:  Diagnosis Date  . Allergy   . Anxiety   . Asthma   . Bipolar 1 disorder ([place])   . Chronic lower back pain 02/07/2014  . Depression    excessive daytime sleepiness-MSLT confirmed pathological degree of hypersomnia-12/13  . Esophageal reflux   . Gastroparesis   . Hypersomnia   . Hypertension   . Medication monitoring encounter 02/07/2014  . Migraine   . Migraine   . Migraines   . Narcolepsy   . OCD (obsessive compulsive disorder)   . Opiate use 07/02/2015  . Osteoarthritis    knees, hands, shoulder, lower back  . Other iron deficiency anemias 08/28/2015  . Overactive bladder   . Psychosis   . Restless leg syndrome   . S/P lumbar fusion 2011  . Schizophrenia ([place])   . Schizophrenia, schizo-affective ([place])   . Seasonal allergies   . Sleep apnea    uses CPAP every night  . Torn rotator cuff it:2012,rt:2013    Past Surgical History:  Procedure Laterality Date  . ABDOMINAL HYSTERECTOMY  2001  . ANKLE FUSION     right  . CHOLECYSTECTOMY N/A 07/20/2013   Procedure: LAPAROSCOPIC CHOLECYSTECTOMY;  Surgeon: Harl Bowie, MD;  Location: [place];  Service: General;  Laterality: N/A;  . COLONOSCOPY WITH PROPOFOL N/A 10/02/2013   Procedure: COLONOSCOPY WITH PROPOFOL;  Surgeon: Garlan Fair, MD;   Location[facility];  Service: Endoscopy;  Laterality: N/A;  . ESOPHAGOGASTRODUODENOSCOPY (EGD) WITH PROPOFOL N/A 10/02/2013   Procedure: ESOPHAGOGASTRODUODENOSCOPY (EGD) WITH PROPOFOL;  Surgeon: Garlan Fair, MD;  Location[facility];  Service: Endoscopy;  Laterality: N/A;  . FLEXIBLE SIGMOIDOSCOPY Left 04/07/2015   Procedure: FLEXIBLE SIGMOIDOSCOPY;  Surgeon: Ronald Lobo, MD;  Location: [facility];  Service: Endoscopy;  Laterality: Left;  . FRACTURE SURGERY    . LAPAROSCOPIC SALPINGO OOPHERECTOMY Left 12/05/2013   Procedure: LAPAROSCOPIC SALPINGO OOPHORECTOMY;  Surgeon: Thurnell Lose, MD;  Location: [facility];  Service: Gynecology;  Laterality: Left;  . LUMBAR FUSION  2011   L4-L5  . OOPHORECTOMY   2004   right  . ROTATOR CUFF REPAIR  2012,2013   torn   . SHOULDER SURGERY     left and right , bone spurs, torn muscle  . SPINE SURGERY    . spurs,ruptured disc bone  2009  . TUBAL LIGATION  2004  . VESICOVAGINAL FISTULA CLOSURE W/ TAH  2001  . WISDOM TOOTH EXTRACTION      There were no vitals filed for this visit.      Subjective Assessment - 11/08/16 1333    Subjective Brings in a Rx for Home TENS unit.  She had alot of soreness when she walks around the grocery store.  Today, 8/10.    Currently in Pain? Yes  Pain Score 8    Pain Location Neck   Pain Orientation Right;Posterior   Pain Onset More than a month ago   Pain Frequency Constant   Aggravating Factors  walking, activity    Pain Relieving Factors meds, heat and stim                          [facility] Adult PT Treatment/Exercise - 11/08/16 1341      Self-Care   Self-Care Other Self-Care Comments   Other Self-Care Comments  HEP, TENS unit for pain relief      Neck Exercises: Supine   Neck Retraction 10 reps   Other Supine Exercise supine yellow scap stab horiz abdct, ER, pullovers x 10 each   Sash on Rt.      Moist Heat Therapy   Number Minutes Moist Heat 15 Minutes   Moist Heat Location  Cervical     Electrical Stimulation   Electrical Stimulation Location post neck    Electrical Stimulation Action TENS for teaching, self care for home unit    Electrical Stimulation Goals Pain     Manual Therapy   Manual Therapy Soft tissue mobilization   Soft tissue mobilization cervical paraspinals, upper traps, bilateral                   PT Short Term Goals - 11/08/16 1347      PT SHORT TERM GOAL #1   Title pt will be independent with simple HEP    Status Achieved     PT SHORT TERM GOAL #2   Title Pt will be able to go on an outing for 30-45 min with min fatigue   Status On-going     PT SHORT TERM GOAL #3   Title Pt will be able to perform grooming, ADLs without increasing neck pain.    Status On-going           PT Long Term Goals - 11/08/16 1351      PT LONG TERM GOAL #1   Title Pt will have normal grip strength equal to that of L hand (or more).    Status On-going     PT LONG TERM GOAL #2   Title Pt will be able to demo full AROM in all planes without pain.    Status On-going     PT LONG TERM GOAL #3   Title Pt will be I with concepts of posture, lifting and body mechanics to prevent further injury.    Status On-going     PT LONG TERM GOAL #4   Title FOTO score will improve to 50% impaired or less to demo overall functional improvement.    Status On-going               Plan - 11/08/16 1452    Clinical Impression Statement Pt pleased with the Home TENS unit and understands precaution and contraindications. for use.  No goal met othe than meeting STG.     PT Next Visit Plan how's TEN working? cervical rotation, check HEP, posture, cont UBE and review supine stab (add sash). , modalities prn, gentle light AROM (no overstretching) grip stretngth    PT Home Exercise Plan chin tuck, scapualr retraction, gentle rotation, yellow band scap stab (except sash)   Consulted and Agree with Plan of Care Patient      Patient will benefit from skilled  therapeutic intervention in order to improve the following deficits and impairments:    Decreased strength, Pain, Impaired flexibility, Impaired UE functional use, Increased fascial restricitons, Decreased range of motion  Visit Diagnosis: Cervicalgia  Other symptoms and signs involving the musculoskeletal system  Pain in right arm  Pain in right hand  Acute pain of right shoulder     Problem List Patient Active Problem List   Diagnosis Date Noted  . Neurogenic pain 01/26/2016  . Other iron deficiency anemias 08/28/2015  . Nondiabetic gastroparesis 08/28/2015  . Chronic lower back pain (L>R) (Primary Pain) 07/02/2015  . Medication monitoring encounter 07/02/2015  . Chronic pain 07/02/2015  . Opiate use 07/02/2015  . Long term prescription opiate use 07/02/2015  . Long term current use of opiate analgesic 07/02/2015  . Encounter for therapeutic drug level monitoring 07/02/2015  . 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 Epidural fibrosis 07/02/2015  . Chronic lumbar radicular pain (Bilateral) (L>R) 07/02/2015  . Bilateral lower extremity pain (L>R) 07/02/2015  . Drug tolerance to opioids 07/02/2015  . Opiate analgesic contract exists 07/02/2015  . Lumbar spondylosis 07/02/2015  . Lumbar facet hypertrophy 07/02/2015  . Grade 1 Retrolisthesis of L1 over L2 07/02/2015  . Grade 1 Anterolisthesis of L5 over S1 07/02/2015  . Lumbar facet arthropathy 07/02/2015  . 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 Enteritis 04/06/2015  . Gastroparesis due to DM ([facility])   . Colitis, acute 04/04/2015  . Dental infection 04/04/2015  . Nausea with vomiting   . Leukocytosis 04/03/2015  . Nausea and vomiting 04/02/2015  . Nausea vomiting and diarrhea 04/02/2015  . Abdominal pain 04/02/2015  . Extrinsic asthma 04/02/2015  . Chronic cholecystitis 07/02/2013  . 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 UARS (upper airway resistance syndrome) 02/14/2013  . Dyspnea 06/09/2011     , 11/08/2016, 3:12 PM  [facility] [address] [place], [place], [address] Phone: [phone]   Fax:  [phone]  Name: Saraiya M Magro MRN: [id number] Date of Birth: 12/16/1970   , PT 11/08/16 3:12 PM Phone: [phone] Fax: [phone]    PHYSICAL THERAPY DISCHARGE SUMMARY  Visits from Start of Care: 4  Current functional level related to goals / functional outcomes: See above for most recent info   Remaining deficits: Unknown, did not return   Education / Equipment: HEP, posture Plan: Patient agrees to discharge.  Patient goals were not met. Patient is being discharged due to not returning since the last visit.  ?????     , PT 12/15/16 3:04 PM Phone: [phone] Fax: [phone]  

## 2016-11-10 ENCOUNTER — Ambulatory Visit: Payer: Federal, State, Local not specified - PPO | Admitting: Physical Therapy

## 2016-11-14 ENCOUNTER — Emergency Department (HOSPITAL_COMMUNITY)
Admission: EM | Admit: 2016-11-14 | Discharge: 2016-11-14 | Disposition: A | Discharge status: Home / Self Care | Payer: Federal, State, Local not specified - PPO | Attending: Emergency Medicine | Admitting: Emergency Medicine

## 2016-11-14 ENCOUNTER — Emergency Department (HOSPITAL_COMMUNITY): Payer: Federal, State, Local not specified - PPO

## 2016-11-14 ENCOUNTER — Encounter (HOSPITAL_COMMUNITY): Payer: Self-pay | Admitting: Emergency Medicine

## 2016-11-14 DIAGNOSIS — R197 Diarrhea, unspecified: Secondary | ICD-10-CM

## 2016-11-14 DIAGNOSIS — I10 Essential (primary) hypertension: Secondary | ICD-10-CM | POA: Diagnosis not present

## 2016-11-14 DIAGNOSIS — J45909 Unspecified asthma, uncomplicated: Secondary | ICD-10-CM | POA: Diagnosis not present

## 2016-11-14 DIAGNOSIS — R112 Nausea with vomiting, unspecified: Secondary | ICD-10-CM | POA: Diagnosis not present

## 2016-11-14 DIAGNOSIS — R1013 Epigastric pain: Secondary | ICD-10-CM | POA: Insufficient documentation

## 2016-11-14 LAB — CBC
HEMATOCRIT: 39.4 % (ref 36.0–46.0)
Hemoglobin: 13.5 g/dL (ref 12.0–15.0)
MCH: 26.7 pg (ref 26.0–34.0)
MCHC: 34.3 g/dL (ref 30.0–36.0)
MCV: 78 fL (ref 78.0–100.0)
PLATELETS: 286 10*3/uL (ref 150–400)
RBC: 5.05 MIL/uL (ref 3.87–5.11)
RDW: 13.4 % (ref 11.5–15.5)
WBC: 13.1 10*3/uL — AB (ref 4.0–10.5)

## 2016-11-14 LAB — COMPREHENSIVE METABOLIC PANEL
ALBUMIN: 4.7 g/dL (ref 3.5–5.0)
ALK PHOS: 61 U/L (ref 38–126)
ALT: 19 U/L (ref 14–54)
AST: 21 U/L (ref 15–41)
Anion gap: 10 (ref 5–15)
BILIRUBIN TOTAL: 0.7 mg/dL (ref 0.3–1.2)
BUN: 8 mg/dL (ref 6–20)
CO2: 28 mmol/L (ref 22–32)
Calcium: 10 mg/dL (ref 8.9–10.3)
Chloride: 99 mmol/L — ABNORMAL LOW (ref 101–111)
Creatinine, Ser: 0.79 mg/dL (ref 0.44–1.00)
GFR calc Af Amer: >60 mL/min (ref >60)
GLUCOSE: 127 mg/dL — AB (ref 65–99)
Potassium: 4.7 mmol/L (ref 3.5–5.1)
Sodium: 137 mmol/L (ref 135–145)
TOTAL PROTEIN: 9.2 g/dL — AB (ref 6.5–8.1)

## 2016-11-14 LAB — LIPASE, BLOOD: Lipase: 22 U/L (ref 11–51)

## 2016-11-14 MED ORDER — LORAZEPAM 1 MG PO TABS
1.0000 mg | ORAL_TABLET | Freq: Once | ORAL | Status: AC
  Administered 2016-11-14: 1 mg via ORAL
  Filled 2016-11-14: qty 1

## 2016-11-14 MED ORDER — METOCLOPRAMIDE HCL 5 MG/ML IJ SOLN
10.0000 mg | Freq: Once | INTRAMUSCULAR | Status: AC
  Administered 2016-11-14: 10 mg via INTRAVENOUS
  Filled 2016-11-14: qty 2

## 2016-11-14 MED ORDER — SODIUM CHLORIDE 0.9 % IV BOLUS (SEPSIS)
1000.0000 mL | Freq: Once | INTRAVENOUS | Status: AC
  Administered 2016-11-14: 1000 mL via INTRAVENOUS

## 2016-11-14 MED ORDER — LORAZEPAM 2 MG/ML IJ SOLN
1.0000 mg | Freq: Once | INTRAMUSCULAR | Status: DC
  Filled 2016-11-14: qty 1

## 2016-11-14 MED ORDER — HYDROCHLOROTHIAZIDE 12.5 MG PO CAPS
25.0000 mg | ORAL_CAPSULE | Freq: Once | ORAL | Status: DC
  Filled 2016-11-14: qty 2

## 2016-11-14 NOTE — ED Provider Notes (Signed)
[place] DEPT Provider Note   CSN: [id number] Arrival date & time: 11/14/16  1404     History   Chief Complaint Chief Complaint  Patient presents with  . Abdominal Pain  . Emesis    HPI [name] is a 46 y.o. female.  HPI  46 y.o. female with a hx of Bipolar Disorder, Gastroparesis, HTN, presents to the Emergency Department today due to lethargy with associated N/V/D since 0930. Notes over 10 episodes since this morning of emesis as well as 4 days of diarrhea since then. Notes headache with back pain as well as generalized abdominal pain. Notes hx same with noted hx Gastroparesis. Seen by GI at [facility] for same. Notes no fevers. No CP/SOB. Unable to take PO meds this AM due to emesis. No other symptoms noted.   Past Medical History:  Diagnosis Date  . Allergy   . Anxiety   . Asthma   . Bipolar 1 disorder ([place])   . Chronic lower back pain 02/07/2014  . Depression    excessive daytime sleepiness-MSLT confirmed pathological degree of hypersomnia-12/13  . Esophageal reflux   . Gastroparesis   . Hypersomnia   . Hypertension   . Medication monitoring encounter 02/07/2014  . Migraine   . Migraine   . Migraines   . Narcolepsy   . OCD (obsessive compulsive disorder)   . Opiate use 07/02/2015  . Osteoarthritis    knees, hands, shoulder, lower back  . Other iron deficiency anemias 08/28/2015  . Overactive bladder   . Psychosis   . Restless leg syndrome   . S/P lumbar fusion 2011  . Schizophrenia ([place])   . Schizophrenia, schizo-affective ([place])   . Seasonal allergies   . Sleep apnea    uses CPAP every night  . Torn rotator cuff it:2012,rt:2013    Patient Active Problem List   Diagnosis Date Noted  . Neurogenic pain 01/26/2016  . Other iron deficiency anemias 08/28/2015  . Nondiabetic gastroparesis 08/28/2015  . Chronic lower back pain (L>R) (Primary Pain) 07/02/2015  . Medication monitoring encounter 07/02/2015  . Chronic pain 07/02/2015  . Opiate use  07/02/2015  . Long term prescription opiate use 07/02/2015  . Long term current use of opiate analgesic 07/02/2015  . Encounter for therapeutic drug level monitoring 07/02/2015  . 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 Epidural fibrosis 07/02/2015  . Chronic lumbar radicular pain (Bilateral) (L>R) 07/02/2015  . Bilateral lower extremity pain (L>R) 07/02/2015  . Drug tolerance to opioids 07/02/2015  . Opiate analgesic contract exists 07/02/2015  . Lumbar spondylosis 07/02/2015  . Lumbar facet hypertrophy 07/02/2015  . Grade 1 Retrolisthesis of L1 over L2 07/02/2015  . Grade 1 Anterolisthesis of L5 over S1 07/02/2015  . Lumbar facet arthropathy 07/02/2015  . 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 Enteritis 04/06/2015  . Gastroparesis due to DM ([place])   . Colitis, acute 04/04/2015  . Dental infection 04/04/2015  . Nausea with vomiting   . Leukocytosis 04/03/2015  . Nausea and vomiting 04/02/2015  . Nausea vomiting and diarrhea 04/02/2015  . Abdominal pain 04/02/2015  . Extrinsic asthma 04/02/2015  . Chronic cholecystitis 07/02/2013  . 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 UARS (upper airway resistance syndrome) 02/14/2013  . Dyspnea 06/09/2011    Past Surgical History:  Procedure Laterality Date  . ABDOMINAL HYSTERECTOMY  2001  . ANKLE FUSION     right  . CHOLECYSTECTOMY N/A 07/20/2013   Procedure: LAPAROSCOPIC CHOLECYSTECTOMY;  Surgeon: Harl Bowie, MD;  Location: [facility] OR;  Service: General;  Laterality: N/A;  . COLONOSCOPY WITH PROPOFOL N/A 10/02/2013   Procedure: COLONOSCOPY WITH PROPOFOL;  Surgeon: Garlan Fair, MD;  Location: [facility] ENDOSCOPY;  Service: Endoscopy;  Laterality: N/A;  . ESOPHAGOGASTRODUODENOSCOPY (EGD) WITH PROPOFOL N/A 10/02/2013   Procedure: ESOPHAGOGASTRODUODENOSCOPY (EGD) WITH PROPOFOL;  Surgeon: Garlan Fair, MD;  Location: [facility] ENDOSCOPY;  Service: Endoscopy;  Laterality: N/A;  . FLEXIBLE SIGMOIDOSCOPY Left 04/07/2015   Procedure: FLEXIBLE  SIGMOIDOSCOPY;  Surgeon: Ronald Lobo, MD;  Location: [facility] ENDOSCOPY;  Service: Endoscopy;  Laterality: Left;  . FRACTURE SURGERY    . LAPAROSCOPIC SALPINGO OOPHERECTOMY Left 12/05/2013   Procedure: LAPAROSCOPIC SALPINGO OOPHORECTOMY;  Surgeon: Thurnell Lose, MD;  Location: [facility];  Service: Gynecology;  Laterality: Left;  . LUMBAR FUSION  2011   L4-L5  . OOPHORECTOMY   2004   right  . ROTATOR CUFF REPAIR  2012,2013   torn   . SHOULDER SURGERY     left and right , bone spurs, torn muscle  . SPINE SURGERY    . spurs,ruptured disc bone  2009  . TUBAL LIGATION  2004  . VESICOVAGINAL FISTULA CLOSURE W/ TAH  2001  . WISDOM TOOTH EXTRACTION      OB History    No data available       Home Medications    Prior to Admission medications   Medication Sig Start Date End Date Taking? Authorizing Provider  albuterol (PROVENTIL HFA;VENTOLIN HFA) 108 (90 BASE) MCG/ACT inhaler Inhale 2 puffs into the lungs every 6 (six) hours as needed for wheezing or shortness of breath.    Historical Provider, MD  amphetamine-dextroamphetamine (ADDERALL) 10 MG tablet 2 in AM and one tab at lunch. 10/05/16   Asencion Partridge Dohmeier, MD  budesonide-formoterol (SYMBICORT) 160-4.5 MCG/ACT inhaler Inhale 2 puffs into the lungs 2 (two) times daily.      Historical Provider, MD  Clobetasol Propionate (CLOBEX) 0.05 % shampoo Apply 1 application topically every Wednesday.     Historical Provider, MD  cycloSPORINE (RESTASIS) 0.05 % ophthalmic emulsion Place 1 drop into both eyes every 12 (twelve) hours.      Historical Provider, MD  diclofenac (VOLTAREN) 50 MG EC tablet Take 1 tablet (50 mg total) by mouth 2 (two) times daily. 12/15/15   Milinda Pointer, MD  dicyclomine (BENTYL) 10 MG capsule Take 10 mg by mouth 4 (four) times daily as needed for spasms.    Historical Provider, MD  dronabinol (MARINOL) 2.5 MG capsule TK 1 C PO BID 11/18/15   Historical Provider, MD  esomeprazole (NEXIUM) 40 MG capsule Take 40 mg by mouth daily at  12 noon.    Historical Provider, MD  fentaNYL (DURAGESIC - DOSED MCG/HR) 75 MCG/HR Place 75 mcg onto the skin every 3 (three) days.    Historical Provider, MD  FLUOCINOLONE ACETONIDE SCALP 0.01 % OIL Apply 1 application topically daily. 02/19/15   Historical Provider, MD  fluocinonide (LIDEX) 0.05 % cream Apply 1 application topically 3 (three) times a week.     Historical Provider, MD  gabapentin (NEURONTIN) 300 MG capsule Take 1 capsule (300 mg total) by mouth 3 (three) times daily. 01/26/16   Milinda Pointer, MD  hydrochlorothiazide (HYDRODIURIL) 25 MG tablet Take 25 mg by mouth every morning.     Historical Provider, MD  hydrocortisone (PROCTOCORT) 1 % CREA Apply topically.    Historical Provider, MD  lidocaine (XYLOCAINE) 2 % jelly 1 application by Other route as needed (rectally).    Historical Provider,  MD  Rolan Lipa 290 MCG CAPS capsule Reported on 12/15/2015 11/26/15   Historical Provider, MD  LORazepam (ATIVAN) 0.5 MG tablet Take 0.5 mg by mouth every 6 (six) hours as needed for anxiety.    Historical Provider, MD  loteprednol (LOTEMAX) 0.2 % SUSP Place 1 drop into both eyes daily.    Historical Provider, MD  lurasidone (LATUDA) 40 MG TABS Take 40 mg by mouth at bedtime. Reported on 08/28/2015    Historical Provider, MD  MYRBETRIQ 25 MG TB24 tablet TK 1 T PO QD 11/29/15   Historical Provider, MD  nitroGLYCERIN (NITROGLYN) 2 % ointment Apply 1 inch topically at bedtime.    Historical Provider, MD  Olopatadine HCl (PATADAY) 0.2 % SOLN Place 1 drop into both eyes 2 (two) times daily as needed (for allergies).     Historical Provider, MD  OnabotulinumtoxinA (BOTOX IJ) Inject as directed every 3 (three) months.    Historical Provider, MD  pantoprazole (PROTONIX) 40 MG tablet Take 1 tablet (40 mg total) by mouth daily. Patient not taking: Reported on 10/28/2016 04/10/15   Orson Eva, MD  pramipexole (MIRAPEX) 1 MG tablet Take 1 mg by mouth at bedtime. 02/19/15   Historical Provider, MD  PREMARIN 1.25 MG  tablet  05/17/16   Historical Provider, MD  promethazine (PHENERGAN) 25 MG tablet Take 50 mg by mouth every 6 (six) hours as needed for nausea or vomiting.  01/28/15   Historical Provider, MD  PROMETHEGAN 25 MG suppository PLACE 1 SUPPOSITORY RECTALLY Q 6 H PRN N FOR UP TO 7 DAYS 01/08/16   Historical Provider, MD  RECTIV 0.4 % OINT APPLY PEA-SIZED AMOUNT TO RECTAL AREA NIGHTLY FOR 14 DAYS FOR FISSURE TREATMENT. 01/14/16   Historical Provider, MD  RELPAX 40 MG tablet Take 40 mg by mouth every 2 (two) hours as needed for migraine or headache.  12/04/13   Historical Provider, MD  saccharomyces boulardii (FLORASTOR) 250 MG capsule Take 1 capsule (250 mg total) by mouth 2 (two) times daily. 04/10/15   Orson Eva, MD  tapentadol (NUCYNTA) 50 MG tablet Take 50 mg by mouth.    Historical Provider, MD  tiZANidine (ZANAFLEX) 4 MG tablet Take 1 tablet (4 mg total) by mouth every 8 (eight) hours as needed for muscle spasms. 01/26/16   Milinda Pointer, MD  topiramate (TOPAMAX) 100 MG tablet Take 100-200 mg by mouth 2 (two) times daily. 1 tab every AM and 2 at night    Historical Provider, MD  triamcinolone cream (KENALOG) 0.1 % Apply 1 application topically daily as needed (to affected area).  11/21/12   Historical Provider, MD  verapamil (VERELAN PM) 240 MG 24 hr capsule Take 240 mg by mouth at bedtime.      Historical Provider, MD  Vitamin D, Ergocalciferol, (DRISDOL) 50000 UNITS CAPS capsule Take 50,000 Units by mouth 2 (two) times a week.    Historical Provider, MD    Family History Family History  Problem Relation Age of Onset  . Cancer Mother     breast  . Cancer Father     prostate,colon  . Hypertension Father   . Thyroid disease Father   . High Cholesterol Father   . Thyroid disease Sister   . Cancer Paternal Aunt     colon  . Cancer Maternal Grandmother     kidney  . Cancer Paternal Grandmother     colon  . Cancer Paternal Aunt     lung  . Narcolepsy Neg Hx  Social History Social History    Substance Use Topics  . Smoking status: Never Smoker  . Smokeless tobacco: Never Used  . Alcohol use No     Allergies   Adhesive [tape]; Aspirin; Benadryl [diphenhydramine hcl]; Codeine; Dust mite extract; Mold extract [trichophyton]; Pollen extract; Prednisone; Toradol [ketorolac tromethamine]; and Zofran [ondansetron]   Review of Systems Review of Systems ROS reviewed and all are negative for acute change except as noted in the HPI.  Physical Exam Updated Vital Signs BP 109/73 (BP Location: Left Arm)   Pulse 100   Temp 98.8 F (37.1 C) (Oral)   Resp 16   SpO2 98%   Physical Exam  Constitutional: She is oriented to person, place, and time. Vital signs are normal. She appears well-developed and well-nourished.  NAD. No active vomiting  HENT:  Head: Normocephalic and atraumatic.  Right Ear: Hearing normal.  Left Ear: Hearing normal.  Eyes: Conjunctivae and EOM are normal. Pupils are equal, round, and reactive to light.  Neck: Normal range of motion. Neck supple.  Cardiovascular: Normal rate, regular rhythm, normal heart sounds and intact distal pulses.   Pulmonary/Chest: Effort normal and breath sounds normal. No respiratory distress. She has no wheezes. She has no rales. She exhibits no tenderness.  Abdominal: Soft. There is generalized tenderness. There is no rigidity, no rebound, no CVA tenderness, no tenderness at McBurney's point and negative Murphy's sign.  Abdomen soft  Musculoskeletal: Normal range of motion.  Neurological: She is alert and oriented to person, place, and time.  Skin: Skin is warm and dry.  Psychiatric: She has a normal mood and affect. Her speech is normal and behavior is normal. Thought content normal.  Nursing note and vitals reviewed.  ED Treatments / Results  Labs (all labs ordered are listed, but only abnormal results are displayed) Labs Reviewed  COMPREHENSIVE METABOLIC PANEL - Abnormal; Notable for the following:       Result Value    Chloride 99 (*)    Glucose, Bld 127 (*)    Total Protein 9.2 (*)    All other components within normal limits  CBC - Abnormal; Notable for the following:    WBC 13.1 (*)    All other components within normal limits  LIPASE, BLOOD  URINALYSIS, ROUTINE W REFLEX MICROSCOPIC   EKG  EKG Interpretation None      Radiology Dg Abdomen Acute W/chest  Result Date: 11/14/2016 CLINICAL DATA:  Severe nausea, vomiting and diarrhea today. History of asthma. EXAM: DG ABDOMEN ACUTE W/ 1V CHEST COMPARISON:  CT abdomen and pelvis April 03, 2015 FINDINGS: Cardiomediastinal silhouette is normal. Lungs are clear, no pleural effusions. No pneumothorax. Soft tissue planes and included osseous structures are nonacute. ACDF. Bowel gas pattern is nondilated and nonobstructive. Surgical clips in the included right abdomen compatible with cholecystectomy. No intra-abdominal mass effect, pathologic calcifications or free air. Phleboliths project in the pelvis. Soft tissue planes and included osseous structures are non-suspicious. RIGHT L5-S1 TLIF in sacral screw. IMPRESSION: Normal chest. Normal bowel gas pattern. Electronically Signed   By: Elon Alas M.D.   On: 11/14/2016 16:07   Procedures Procedures (including critical care time)  Medications Ordered in ED Medications  sodium chloride 0.9 % bolus 1,000 mL (1,000 mLs Intravenous New Bag/Given 11/14/16 1553)  metoCLOPramide (REGLAN) injection 10 mg (10 mg Intravenous Given 11/14/16 1546)   Initial Impression / Assessment and Plan / ED Course  I have reviewed the triage vital signs and the nursing notes.  Pertinent labs &  imaging results that were available during my care of the patient were reviewed by me and considered in my medical decision making (see chart for details).  Final Clinical Impressions(s) / ED Diagnoses  {I have reviewed and evaluated the relevant laboratory values. {I have reviewed and evaluated the relevant imaging studies.  {I have  reviewed the relevant previous healthcare records.  {I obtained HPI from historian.   ED Course:  Assessment: Patient is a 46 y.o. female presents with abdominal pain x 24 hours. Associated N/V/D. Hx same with Gastroparesis. Seen by GI at [facility] for same. No fevers. No CP/SOB. On exam, nontoxic, nonseptic appearing, in no apparent distress. Patient's pain and other symptoms adequately managed in emergency department.  Fluid bolus given.  Labs, imaging and vitals reviewed.  Patient does not meet the SIRS or Sepsis criteria.  On repeat exam patient does not have a surgical abdomen and there are no peritoneal signs.  No indication of appendicitis, bowel obstruction, bowel perforation, cholecystitis, diverticulitis, PID or ectopic pregnancy. Given Reglan and fluids in ED with improvement of symptoms. Able to tolerate PO in ED. Patient discharged home with symptomatic treatment and given strict instructions for follow-up with their primary care physician.  I have also discussed reasons to return immediately to the ER.  Patient expresses understanding and agrees with plan.  Disposition/Plan:  DC Home Additional Verbal discharge instructions given and discussed with patient.  Pt Instructed to f/u with GI at [facility] in the next week for evaluation and treatment of symptoms. Return precautions given Pt acknowledges and agrees with plan  Supervising Physician Merrily Pew, MD  Final diagnoses:  Epigastric pain  Nausea vomiting and diarrhea    New Prescriptions New Prescriptions   No medications on file     Shary Decamp, PA-C 11/14/16 [place], MD 11/14/16 1815

## 2016-11-14 NOTE — ED Notes (Signed)
ED Provider at bedside. 

## 2016-11-14 NOTE — ED Triage Notes (Signed)
Pt presents lethargic c/o emesis and diarrhea onset of 930. States over 10 episodes of emesis and about 4 episodes of diarrhea in last 24 hours. Pt c/o headache, back pain and generalized abdominal pain.

## 2016-11-14 NOTE — ED Notes (Signed)
Patient given sprite.

## 2016-11-14 NOTE — Discharge Instructions (Signed)
Please read and follow all provided instructions.  Your diagnoses today include:  1. Epigastric pain   2. Nausea vomiting and diarrhea     Tests performed today include: Blood counts and electrolytes Blood tests to check liver and kidney function Blood tests to check pancreas function Urine test to look for infection and pregnancy (in women) Vital signs. See below for your results today.   Medications prescribed:   Take any prescribed medications only as directed.  Home care instructions:  Follow any educational materials contained in this packet.  Follow-up instructions: Please follow-up with your primary care provider in the next 2 days for further evaluation of your symptoms.    Return instructions:  SEEK IMMEDIATE MEDICAL ATTENTION IF: The pain does not go away or becomes severe  A temperature above 101F develops  Repeated vomiting occurs (multiple episodes)  The pain becomes localized to portions of the abdomen. The right side could possibly be appendicitis. In an adult, the left lower portion of the abdomen could be colitis or diverticulitis.  Blood is being passed in stools or vomit (bright red or black tarry stools)  You develop chest pain, difficulty breathing, dizziness or fainting, or become confused, poorly responsive, or inconsolable (young children) If you have any other emergent concerns regarding your health  Additional Information: Abdominal (belly) pain can be caused by many things. Your caregiver performed an examination and possibly ordered blood/urine tests and imaging (CT scan, x-rays, ultrasound). Many cases can be observed and treated at home after initial evaluation in the emergency department. Even though you are being discharged home, abdominal pain can be unpredictable. Therefore, you need a repeated exam if your pain does not resolve, returns, or worsens. Most patients with abdominal pain don't have to be admitted to the hospital or have surgery, but  serious problems like appendicitis and gallbladder attacks can start out as nonspecific pain. Many abdominal conditions cannot be diagnosed in one visit, so follow-up evaluations are very important.  Your vital signs today were: BP 109/73 (BP Location: Left Arm)    Pulse 100    Temp 98.8 F (37.1 C) (Oral)    Resp 16    SpO2 98%  If your blood pressure (bp) was elevated above 135/85 this visit, please have this repeated by your doctor within one month. --------------

## 2016-11-14 NOTE — ED Notes (Signed)
Patient tolerating PO fluids. Patient reports feeling agitated. PA made aware.

## 2016-11-15 ENCOUNTER — Ambulatory Visit: Payer: Federal, State, Local not specified - PPO | Admitting: Physical Therapy

## 2016-11-17 ENCOUNTER — Ambulatory Visit: Payer: Federal, State, Local not specified - PPO | Admitting: Physical Therapy

## 2016-11-19 ENCOUNTER — Telehealth: Payer: Self-pay | Admitting: Neurology

## 2016-11-19 DIAGNOSIS — G4733 Obstructive sleep apnea (adult) (pediatric): Secondary | ICD-10-CM

## 2016-11-19 DIAGNOSIS — G47419 Narcolepsy without cataplexy: Secondary | ICD-10-CM

## 2016-11-19 DIAGNOSIS — K50014 Crohn's disease of small intestine with abscess: Secondary | ICD-10-CM

## 2016-11-19 DIAGNOSIS — Z9989 Dependence on other enabling machines and devices: Secondary | ICD-10-CM

## 2016-11-19 MED ORDER — AMPHETAMINE-DEXTROAMPHETAMINE 10 MG PO TABS: ORAL_TABLET | ORAL | 0 refills | Status: DC

## 2016-11-19 NOTE — Addendum Note (Signed)
Addended by: Larey Seat on: 11/19/2016 11:37 AM   Modules accepted: Orders

## 2016-11-19 NOTE — Telephone Encounter (Signed)
Patient called office requesting refill for amphetamine-dextroamphetamine (ADDERALL) 10 MG tablet

## 2016-11-22 ENCOUNTER — Telehealth: Payer: Self-pay | Admitting: Physical Therapy

## 2016-11-22 ENCOUNTER — Ambulatory Visit: Payer: Federal, State, Local not specified - PPO | Admitting: Physical Therapy

## 2016-11-22 NOTE — Telephone Encounter (Signed)
Called patient regarding her missed appt today and 2 cancelled appts last week.  I understand she is having other unrelated medical issues but asked she call us for an update (continue PT?)

## 2016-11-22 NOTE — Telephone Encounter (Signed)
I called pt. I advised her that her RX for adderall is ready for pick up at the front desk. Pt verbalized understanding.

## 2016-11-24 ENCOUNTER — Ambulatory Visit: Payer: Federal, State, Local not specified - PPO | Admitting: Physical Therapy

## 2016-11-24 ENCOUNTER — Telehealth: Payer: Self-pay | Admitting: Physical Therapy

## 2016-11-24 DIAGNOSIS — G43719 Chronic migraine without aura, intractable, without status migrainosus: Secondary | ICD-10-CM | POA: Diagnosis not present

## 2016-11-24 DIAGNOSIS — G43009 Migraine without aura, not intractable, without status migrainosus: Secondary | ICD-10-CM | POA: Diagnosis not present

## 2016-11-24 NOTE — Telephone Encounter (Signed)
Patient missed her appointment today for PT.  Left message.  She has not more appts scheduled.  Will await her call prior to discharging her.

## 2016-11-30 DIAGNOSIS — M961 Postlaminectomy syndrome, not elsewhere classified: Secondary | ICD-10-CM | POA: Diagnosis not present

## 2016-11-30 DIAGNOSIS — Z4789 Encounter for other orthopedic aftercare: Secondary | ICD-10-CM | POA: Diagnosis not present

## 2016-11-30 DIAGNOSIS — M5012 Mid-cervical disc disorder, unspecified level: Secondary | ICD-10-CM | POA: Diagnosis not present

## 2016-11-30 DIAGNOSIS — M5442 Lumbago with sciatica, left side: Secondary | ICD-10-CM | POA: Diagnosis not present

## 2016-12-06 DIAGNOSIS — Z4789 Encounter for other orthopedic aftercare: Secondary | ICD-10-CM | POA: Diagnosis not present

## 2016-12-06 DIAGNOSIS — M50123 Cervical disc disorder at C6-C7 level with radiculopathy: Secondary | ICD-10-CM | POA: Diagnosis not present

## 2016-12-07 DIAGNOSIS — R42 Dizziness and giddiness: Secondary | ICD-10-CM | POA: Diagnosis not present

## 2016-12-07 DIAGNOSIS — G43719 Chronic migraine without aura, intractable, without status migrainosus: Secondary | ICD-10-CM | POA: Diagnosis not present

## 2016-12-07 DIAGNOSIS — G2581 Restless legs syndrome: Secondary | ICD-10-CM | POA: Diagnosis not present

## 2016-12-07 DIAGNOSIS — G43009 Migraine without aura, not intractable, without status migrainosus: Secondary | ICD-10-CM | POA: Diagnosis not present

## 2016-12-23 ENCOUNTER — Other Ambulatory Visit: Payer: Self-pay | Admitting: Neurology

## 2016-12-23 DIAGNOSIS — K50014 Crohn's disease of small intestine with abscess: Secondary | ICD-10-CM

## 2016-12-23 DIAGNOSIS — Z9989 Dependence on other enabling machines and devices: Secondary | ICD-10-CM

## 2016-12-23 DIAGNOSIS — G47419 Narcolepsy without cataplexy: Secondary | ICD-10-CM

## 2016-12-23 DIAGNOSIS — G4733 Obstructive sleep apnea (adult) (pediatric): Secondary | ICD-10-CM

## 2016-12-23 NOTE — Telephone Encounter (Signed)
Dr. Brett Fairy patient. Rx due, up to date on appts, has pending appt in June. Ok to fill?

## 2016-12-23 NOTE — Telephone Encounter (Signed)
Ok to fill but obviously patient needs to come Monday since the office is closed thanks

## 2016-12-23 NOTE — Telephone Encounter (Signed)
Patient called office requesting refill for amphetamine-dextroamphetamine (ADDERALL) 10 MG tablet.  Patient notified office will be closed tomorrow per patients request she ask if she can pick it up this afternoon before 5.

## 2016-12-24 DIAGNOSIS — K5901 Slow transit constipation: Secondary | ICD-10-CM | POA: Diagnosis not present

## 2016-12-24 DIAGNOSIS — N3281 Overactive bladder: Secondary | ICD-10-CM | POA: Diagnosis not present

## 2016-12-24 DIAGNOSIS — M6289 Other specified disorders of muscle: Secondary | ICD-10-CM | POA: Diagnosis not present

## 2016-12-24 DIAGNOSIS — K5904 Chronic idiopathic constipation: Secondary | ICD-10-CM | POA: Diagnosis not present

## 2016-12-27 MED ORDER — AMPHETAMINE-DEXTROAMPHETAMINE 10 MG PO TABS: ORAL_TABLET | ORAL | 0 refills | Status: DC

## 2016-12-27 NOTE — Telephone Encounter (Signed)
Rx printed waiting for signature.

## 2016-12-27 NOTE — Addendum Note (Signed)
Addended by: Laurence Spates on: 12/27/2016 08:38 AM   Modules accepted: Orders

## 2016-12-27 NOTE — Telephone Encounter (Signed)
Rx signed and placed up front for pickup. 

## 2017-01-04 DIAGNOSIS — Z79891 Long term (current) use of opiate analgesic: Secondary | ICD-10-CM | POA: Diagnosis not present

## 2017-01-04 DIAGNOSIS — M961 Postlaminectomy syndrome, not elsewhere classified: Secondary | ICD-10-CM | POA: Diagnosis not present

## 2017-01-04 DIAGNOSIS — G8929 Other chronic pain: Secondary | ICD-10-CM | POA: Diagnosis not present

## 2017-01-04 DIAGNOSIS — M5136 Other intervertebral disc degeneration, lumbar region: Secondary | ICD-10-CM | POA: Diagnosis not present

## 2017-01-14 DIAGNOSIS — K5901 Slow transit constipation: Secondary | ICD-10-CM | POA: Diagnosis not present

## 2017-01-14 DIAGNOSIS — N3281 Overactive bladder: Secondary | ICD-10-CM | POA: Diagnosis not present

## 2017-01-14 DIAGNOSIS — M6289 Other specified disorders of muscle: Secondary | ICD-10-CM | POA: Diagnosis not present

## 2017-01-26 ENCOUNTER — Other Ambulatory Visit: Payer: Self-pay | Admitting: Neurology

## 2017-01-26 DIAGNOSIS — G4733 Obstructive sleep apnea (adult) (pediatric): Secondary | ICD-10-CM

## 2017-01-26 DIAGNOSIS — G47419 Narcolepsy without cataplexy: Secondary | ICD-10-CM

## 2017-01-26 DIAGNOSIS — Z9989 Dependence on other enabling machines and devices: Secondary | ICD-10-CM

## 2017-01-26 DIAGNOSIS — K50014 Crohn's disease of small intestine with abscess: Secondary | ICD-10-CM

## 2017-01-26 MED ORDER — AMPHETAMINE-DEXTROAMPHETAMINE 10 MG PO TABS: ORAL_TABLET | ORAL | 0 refills | Status: DC

## 2017-01-26 NOTE — Telephone Encounter (Signed)
Dr. Brett Fairy printed the adderall RX on plain paper. Reprinted on RX paper. Will await Dr. Edwena Felty signature.

## 2017-01-26 NOTE — Telephone Encounter (Signed)
Pt request refill for amphetamine-dextroamphetamine (ADDERALL) 10 MG tablet °

## 2017-01-26 NOTE — Telephone Encounter (Signed)
I called pt to advise her that her RX for adderall is ready for pick up at the front desk. No answer, left a message asking her to call me back. If pt calls back, please advise her of this and of clinic hours.

## 2017-02-08 DIAGNOSIS — G43719 Chronic migraine without aura, intractable, without status migrainosus: Secondary | ICD-10-CM | POA: Diagnosis not present

## 2017-02-08 DIAGNOSIS — G2581 Restless legs syndrome: Secondary | ICD-10-CM | POA: Diagnosis not present

## 2017-02-22 ENCOUNTER — Encounter: Payer: Self-pay | Admitting: Adult Health

## 2017-02-22 ENCOUNTER — Ambulatory Visit (INDEPENDENT_AMBULATORY_CARE_PROVIDER_SITE_OTHER): Payer: Federal, State, Local not specified - PPO | Admitting: Adult Health

## 2017-02-22 VITALS — BP 127/80 | HR 86 | Wt 167.0 lb

## 2017-02-22 DIAGNOSIS — G4733 Obstructive sleep apnea (adult) (pediatric): Secondary | ICD-10-CM

## 2017-02-22 DIAGNOSIS — G47419 Narcolepsy without cataplexy: Secondary | ICD-10-CM | POA: Diagnosis not present

## 2017-02-22 DIAGNOSIS — Z9989 Dependence on other enabling machines and devices: Secondary | ICD-10-CM

## 2017-02-22 MED ORDER — AMPHETAMINE-DEXTROAMPHETAMINE 10 MG PO TABS: ORAL_TABLET | ORAL | 0 refills | Status: DC

## 2017-02-22 NOTE — Progress Notes (Addendum)
PATIENT: [name] DOB: September 21, 1970  REASON FOR VISIT: follow up- obstructive sleep apnea on CPAP, narcolepsy HISTORY FROM: patient  HISTORY OF PRESENT ILLNESS: [name] is a 46 year old female with a history of obstructive sleep apnea on CPAP and narcolepsy. She returns today for follow-up. The patient CPAP machine does not hold data. That reason we've been unable to obtain a CPAP download. The patient will be given a prescription today to have a new machine. She did do a 10 day download from the machine and it showed that she as compliant. The patient continues to use Adderall 20 mg she reports that he recently she has noticed more daytime sleepiness. She states that she did get a little sleepy driving to the office visit today. She returns today for an evaluation.  HISTORY 08/24/16 Copied from Dr. Edwena Felty notes: [name] has been established patient in our sleep clinic and carries a diagnosis of narcolepsy as well as sleep apnea. His bladder compliant CPAP user followed by [facility], out of [place], [place]. Her average AHI on CPAP was 1.1 at a setting of only 6 cm water. However she had not the ability to store therapeutic data on her current CPAP machine, and a loaner machine was given to her for 10 days. She has 100% compliance for 10 days, with an average user time of 3 hours and 2 minutes of sleep.  She promised improvement, using the machine during naps from now on Adderall has been beneficial , but she still naps. There going to change her Adderall XR 20 mg today to 2 of 10 mg tablets, and she will keep the 10 mg dose at lunchtime. Total dose will not exceed 30 mg.  The Epworth score was endorsed at 16 points, fatigue severity was endorsed at 61 points close to the maximum of 63.   REVIEW OF SYSTEMS: Out of a complete 14 system review of symptoms, the patient complains only of the following symptoms, and all other reviewed systems are negative.  Chills fatigue,  activity change, drooling, runny nose, restless leg, insomnia, apnea, frequent waking, daytime sleepiness, snoring, vomiting, nausea, constipation, rectal bleeding, abdominal pain, swollen abdomen, heat intolerance, environmental allergies, food allergies, incontinence of bladder, frequency of urination, urgency, joint pain, joint swelling, back pain, aching muscles, muscle cramps, neck pain, neck stiffness, agitation, decreased concentration, depression, dizziness, headache, numbness, weakness  ALLERGIES: Allergies  Allergen Reactions  . Adhesive [Tape] Rash    Glue from steri strips  . Aspirin Nausea Only  . Benadryl [Diphenhydramine Hcl] Other (See Comments)    Makes her hyper, jittery  . Codeine Nausea And Vomiting  . Dust Mite Extract Other (See Comments)    Cough, sneeze, eyes runny  . Mold Extract [Trichophyton] Other (See Comments)    Cough, sneeze, eyes/nose runny  . Pollen Extract Other (See Comments)    Cough, sneeze, eyes/nose runny  . Prednisone Other (See Comments)    Makes her hyper, jittery   . Toradol [Ketorolac Tromethamine] Anxiety  . Zofran [Ondansetron] Nausea And Vomiting    HOME MEDICATIONS: Outpatient Medications Prior to Visit  Medication Sig Dispense Refill  . albuterol (PROVENTIL HFA;VENTOLIN HFA) 108 (90 BASE) MCG/ACT inhaler Inhale 2 puffs into the lungs every 6 (six) hours as needed for wheezing or shortness of breath.    . amphetamine-dextroamphetamine (ADDERALL) 10 MG tablet 2 in AM and one tab at lunch. 90 tablet 0  . budesonide-formoterol (SYMBICORT) 160-4.5 MCG/ACT inhaler Inhale 2 puffs into the lungs  2 (two) times daily.      . Clobetasol Propionate (CLOBEX) 0.05 % shampoo Apply 1 application topically every Wednesday.     . cycloSPORINE (RESTASIS) 0.05 % ophthalmic emulsion Place 1 drop into both eyes every 12 (twelve) hours.      . diclofenac (VOLTAREN) 50 MG EC tablet Take 1 tablet (50 mg total) by mouth 2 (two) times daily. 60 tablet 0  .  dicyclomine (BENTYL) 10 MG capsule Take 10 mg by mouth 4 (four) times daily as needed for spasms.    Marland Kitchen dronabinol (MARINOL) 5 MG capsule TK 1 C PO BID  3  . esomeprazole (NEXIUM) 40 MG capsule Take 40 mg by mouth daily at 12 noon.    Marland Kitchen FLUOCINOLONE ACETONIDE SCALP 0.01 % OIL Apply 1 application topically daily.  0  . fluocinonide (LIDEX) 0.05 % cream Apply 1 application topically 3 (three) times a week.     . gabapentin (NEURONTIN) 300 MG capsule Take 1 capsule (300 mg total) by mouth 3 (three) times daily. 90 capsule 2  . hydrochlorothiazide (HYDRODIURIL) 25 MG tablet Take 25 mg by mouth every morning.     . hydrocortisone (PROCTOCORT) 1 % CREA Apply topically.    . lidocaine (XYLOCAINE) 2 % jelly 1 application by Other route as needed (rectally).    Marland Kitchen LINZESS 290 MCG CAPS capsule Reported on 12/15/2015  3  . LORazepam (ATIVAN) 0.5 MG tablet Take 0.5 mg by mouth every 6 (six) hours as needed for anxiety.    Marland Kitchen loteprednol (LOTEMAX) 0.2 % SUSP Place 1 drop into both eyes daily.    Marland Kitchen lurasidone (LATUDA) 40 MG TABS Take 40 mg by mouth at bedtime. Reported on 08/28/2015    . MYRBETRIQ 25 MG TB24 tablet TK 1 T PO QD  3  . nitroGLYCERIN (NITROGLYN) 2 % ointment Apply 1 inch topically at bedtime.    . Olopatadine HCl (PATADAY) 0.2 % SOLN Place 1 drop into both eyes 2 (two) times daily as needed (for allergies).     . OnabotulinumtoxinA (BOTOX IJ) Inject as directed every 3 (three) months.    . pramipexole (MIRAPEX) 1 MG tablet Take 1 mg by mouth at bedtime.  0  . PREMARIN 0.3 MG tablet Take 0.3 mg by mouth daily.    . promethazine (PHENERGAN) 25 MG tablet Take 50 mg by mouth every 6 (six) hours as needed for nausea or vomiting.   3  . PROMETHEGAN 25 MG suppository PLACE 1 SUPPOSITORY RECTALLY Q 6 H PRN N FOR UP TO 7 DAYS  11  . RECTIV 0.4 % OINT APPLY PEA-SIZED AMOUNT TO RECTAL AREA NIGHTLY FOR 14 DAYS FOR FISSURE TREATMENT.  0  . RELPAX 40 MG tablet Take 40 mg by mouth every 2 (two) hours as needed  for migraine or headache.     . saccharomyces boulardii (FLORASTOR) 250 MG capsule Take 1 capsule (250 mg total) by mouth 2 (two) times daily. 60 capsule 1  . tapentadol (NUCYNTA) 50 MG tablet Take 100 mg by mouth.     . terconazole (TERAZOL 7) 0.4 % vaginal cream Place 1 applicator vaginally daily.    Marland Kitchen tiZANidine (ZANAFLEX) 4 MG tablet Take 1 tablet (4 mg total) by mouth every 8 (eight) hours as needed for muscle spasms. 90 tablet 2  . topiramate (TOPAMAX) 100 MG tablet Take 100-200 mg by mouth 2 (two) times daily. 1 tab every AM and 2 at night    . triamcinolone cream (KENALOG) 0.1 %  Apply 1 application topically daily as needed (to affected area).     . verapamil (VERELAN PM) 240 MG 24 hr capsule Take 240 mg by mouth at bedtime.      . Vitamin D, Ergocalciferol, (DRISDOL) 50000 UNITS CAPS capsule Take 50,000 Units by mouth every 7 (seven) days.     . fentaNYL (DURAGESIC - DOSED MCG/HR) 75 MCG/HR Place 75 mcg onto the skin every 3 (three) days.    . methocarbamol (ROBAXIN) 500 MG tablet Take 500 mg by mouth daily.  0   No facility-administered medications prior to visit.     PAST MEDICAL HISTORY: Past Medical History:  Diagnosis Date  . Allergy   . Anxiety   . Asthma   . Bipolar 1 disorder ([place])   . Chronic lower back pain 02/07/2014  . Depression    excessive daytime sleepiness-MSLT confirmed pathological degree of hypersomnia-12/13  . Esophageal reflux   . Gastroparesis   . Hypersomnia   . Hypertension   . Medication monitoring encounter 02/07/2014  . Migraine   . Migraine   . Migraines   . Narcolepsy   . OCD (obsessive compulsive disorder)   . Opiate use 07/02/2015  . Osteoarthritis    knees, hands, shoulder, lower back  . Other iron deficiency anemias 08/28/2015  . Overactive bladder   . Psychosis   . Restless leg syndrome   . S/P lumbar fusion 2011  . Schizophrenia ([place])   . Schizophrenia, schizo-affective ([place])   . Seasonal allergies   . Sleep apnea    uses CPAP  every night  . Torn rotator cuff it:2012,rt:2013    PAST SURGICAL HISTORY: Past Surgical History:  Procedure Laterality Date  . ABDOMINAL HYSTERECTOMY  2001  . ANKLE FUSION     right  . CHOLECYSTECTOMY N/A 07/20/2013   Procedure: LAPAROSCOPIC CHOLECYSTECTOMY;  Surgeon: Harl Bowie, MD;  Location: [facility];  Service: General;  Laterality: N/A;  . COLONOSCOPY WITH PROPOFOL N/A 10/02/2013   Procedure: COLONOSCOPY WITH PROPOFOL;  Surgeon: Garlan Fair, MD;  Location: [facility];  Service: Endoscopy;  Laterality: N/A;  . ESOPHAGOGASTRODUODENOSCOPY (EGD) WITH PROPOFOL N/A 10/02/2013   Procedure: ESOPHAGOGASTRODUODENOSCOPY (EGD) WITH PROPOFOL;  Surgeon: Garlan Fair, MD;  Location: [facility];  Service: Endoscopy;  Laterality: N/A;  . FLEXIBLE SIGMOIDOSCOPY Left 04/07/2015   Procedure: FLEXIBLE SIGMOIDOSCOPY;  Surgeon: Ronald Lobo, MD;  Location: [facility];  Service: Endoscopy;  Laterality: Left;  . FRACTURE SURGERY    . LAPAROSCOPIC SALPINGO OOPHERECTOMY Left 12/05/2013   Procedure: LAPAROSCOPIC SALPINGO OOPHORECTOMY;  Surgeon: Thurnell Lose, MD;  Location: [facility];  Service: Gynecology;  Laterality: Left;  . LUMBAR FUSION  2011   L4-L5  . OOPHORECTOMY   2004   right  . ROTATOR CUFF REPAIR  2012,2013   torn   . SHOULDER SURGERY     left and right , bone spurs, torn muscle  . SPINE SURGERY    . spurs,ruptured disc bone  2009  . TUBAL LIGATION  2004  . VESICOVAGINAL FISTULA CLOSURE W/ TAH  2001  . WISDOM TOOTH EXTRACTION      FAMILY HISTORY: Family History  Problem Relation Age of Onset  . Cancer Mother        breast  . Cancer Father        prostate,colon  . Hypertension Father   . Thyroid disease Father   . High Cholesterol Father   . Thyroid disease Sister   . Cancer Paternal Aunt  colon  . Cancer Maternal Grandmother        kidney  . Cancer Paternal Grandmother        colon  . Cancer Paternal Aunt        lung  . Narcolepsy Neg Hx     SOCIAL  HISTORY: Social History   Social History  . Marital status: Married    Spouse name: Elberta Fortis  . Number of children: 3  . Years of education: Master's   Occupational History  . unemployed   .  Unemployed   Social History Main Topics  . Smoking status: Never Smoker  . Smokeless tobacco: Never Used  . Alcohol use No  . Drug use: No  . Sexual activity: Yes    Birth control/ protection: Surgical   Other Topics Concern  . Not on file   Social History Narrative    This patient has an RDI of 12 and AHI of 4.8,  titrated to 6 cm water after PSG, she did qualify for MSLT, Epworth 22 today on 6 cm water.      Based on her MSLT  of 3.6 minutes without SREM and Epworth of 22 on CPAP with residual AHI of 1.8, she should be treated  in a trial base for narcolepsy. She is on many REM supressant medications, and may not be able to  express the narcolepsy in an MSLT.   Patient is married Elberta Fortis) and lives at home with her husband and three children.   Patient is currently unemployed.   Patient has a Master's degree.   Patient is right-handed.   Patient drinks two cups of coffee on average but none lately for 6 months, 3 cups of tea daily.      PHYSICAL EXAM  Vitals:   02/22/17 1440  BP: 127/80  Pulse: 86  Weight: 167 lb (75.8 kg)   Body mass index is 30.54 kg/m.  Generalized: Well developed, in no acute distress   Neurological examination  Mentation: Alert oriented to time, place, history taking. Follows all commands speech and language fluent Cranial nerve II-XII: Pupils were equal round reactive to light. Extraocular movements were full, visual field were full on confrontational test. Facial sensation and strength were normal. Uvula tongue midline. Head turning and shoulder shrug  were normal and symmetric. Motor: The motor testing reveals 5 over 5 strength of all 4 extremities. Good symmetric motor tone is noted throughout.  Sensory: Sensory testing is intact to soft touch on  all 4 extremities. No evidence of extinction is noted.  Coordination: Cerebellar testing reveals good finger-nose-finger and heel-to-shin bilaterally.  Gait and station: Gait is normal. .  Reflexes: Deep tendon reflexes are symmetric and normal bilaterally.   DIAGNOSTIC DATA (LABS, IMAGING, TESTING) - I reviewed patient records, labs, notes, testing and imaging myself where available.  Lab Results  Component Value Date   WBC 13.1 (H) 11/14/2016   HGB 13.5 11/14/2016   HCT 39.4 11/14/2016   MCV 78.0 11/14/2016   PLT 286 11/14/2016      Component Value Date/Time   NA 137 11/14/2016 1542   NA 143 08/28/2015 1557   K 4.7 11/14/2016 1542   CL 99 (L) 11/14/2016 1542   CO2 28 11/14/2016 1542   GLUCOSE 127 (H) 11/14/2016 1542   BUN 8 11/14/2016 1542   BUN 7 08/28/2015 1557   CREATININE 0.79 11/14/2016 1542   CALCIUM 10.0 11/14/2016 1542   PROT 9.2 (H) 11/14/2016 1542   PROT 7.1 08/28/2015 1557  ALBUMIN 4.7 11/14/2016 1542   ALBUMIN 4.3 08/28/2015 1557   AST 21 11/14/2016 1542   ALT 19 11/14/2016 1542   ALKPHOS 61 11/14/2016 1542   BILITOT 0.7 11/14/2016 1542   BILITOT 0.2 08/28/2015 1557   GFRNONAA >60 11/14/2016 1542   GFRAA >60 11/14/2016 1542    Lab Results  Component Value Date   VITAMINB12 918 (H) 12/15/2015      ASSESSMENT AND PLAN 46 y.o. year old female  has a past medical history of Allergy; Anxiety; Asthma; Bipolar 1 disorder ([place]); Chronic lower back pain (02/07/2014); Depression; Esophageal reflux; Gastroparesis; Hypersomnia; Hypertension; Medication monitoring encounter (02/07/2014); Migraine; Migraine; Migraines; Narcolepsy; OCD (obsessive compulsive disorder); Opiate use (07/02/2015); Osteoarthritis; Other iron deficiency anemias (08/28/2015); Overactive bladder; Psychosis; Restless leg syndrome; S/P lumbar fusion (2011); Schizophrenia ([place]); Schizophrenia, schizo-affective ([place]); Seasonal allergies; Sleep apnea; and Torn rotator cuff (it:2012,rt:2013). here  with:  1. Obstructive sleep apnea on CPAP 2. Narcolepsy  We are unable to view a download as her CPAP machine does not hold data. I have given her a prescription that will be faxed over to her DME company for a new machine. She will continue on Adderall 20 mg morning and 10 mg in the evening. If her daytime sleepiness significantly worsens we will have to consider either increasing her medication or trying something different. The patient verbalized understanding. She will follow-up in 6 months or sooner if needed.       Ward Givens, MSN, NP-C 02/22/2017, 2:42 PM [facility] [address], [place], [place] [address] [phone]  I reviewed the above note and documentation by the Nurse Practitioner and agree with the history, physical exam, assessment and plan as outlined above. I was immediately available for face-to-face consultation. Star Age, MD, PhD [facility])

## 2017-02-22 NOTE — Patient Instructions (Signed)
Continue Adderall Continue using CPAP nightly- new cpap ordered If your symptoms worsen or you develop new symptoms please let us know.

## 2017-02-23 NOTE — Progress Notes (Signed)
Order for new CPAP sent to [facility], pt's current DME. Received a receipt of confirmation.

## 2017-03-01 NOTE — Progress Notes (Signed)
Received [facility] cpap/bi-pap referral form from [facility]. Signed and faxed with confirmation. 03-01-17

## 2017-03-04 DIAGNOSIS — G4733 Obstructive sleep apnea (adult) (pediatric): Secondary | ICD-10-CM | POA: Diagnosis not present

## 2017-03-04 DIAGNOSIS — Z4789 Encounter for other orthopedic aftercare: Secondary | ICD-10-CM | POA: Diagnosis not present

## 2017-03-04 DIAGNOSIS — M50123 Cervical disc disorder at C6-C7 level with radiculopathy: Secondary | ICD-10-CM | POA: Diagnosis not present

## 2017-03-07 DIAGNOSIS — Z01411 Encounter for gynecological examination (general) (routine) with abnormal findings: Secondary | ICD-10-CM | POA: Diagnosis not present

## 2017-03-18 DIAGNOSIS — G43719 Chronic migraine without aura, intractable, without status migrainosus: Secondary | ICD-10-CM | POA: Diagnosis not present

## 2017-04-13 ENCOUNTER — Other Ambulatory Visit: Payer: Self-pay | Admitting: Adult Health

## 2017-04-13 DIAGNOSIS — M5012 Mid-cervical disc disorder, unspecified level: Secondary | ICD-10-CM | POA: Diagnosis not present

## 2017-04-13 DIAGNOSIS — G47419 Narcolepsy without cataplexy: Secondary | ICD-10-CM

## 2017-04-13 DIAGNOSIS — G8929 Other chronic pain: Secondary | ICD-10-CM | POA: Diagnosis not present

## 2017-04-13 DIAGNOSIS — M5136 Other intervertebral disc degeneration, lumbar region: Secondary | ICD-10-CM | POA: Diagnosis not present

## 2017-04-13 DIAGNOSIS — Z9989 Dependence on other enabling machines and devices: Secondary | ICD-10-CM

## 2017-04-13 DIAGNOSIS — G4733 Obstructive sleep apnea (adult) (pediatric): Secondary | ICD-10-CM

## 2017-04-13 DIAGNOSIS — M961 Postlaminectomy syndrome, not elsewhere classified: Secondary | ICD-10-CM | POA: Diagnosis not present

## 2017-04-13 MED ORDER — AMPHETAMINE-DEXTROAMPHETAMINE 10 MG PO TABS: ORAL_TABLET | ORAL | 0 refills | Status: DC

## 2017-04-13 NOTE — Telephone Encounter (Signed)
Patient requesting refill of amphetamine-dextroamphetamine (ADDERALL) 10 MG tablet. ° ° °

## 2017-04-13 NOTE — Telephone Encounter (Signed)
Printed/signed given to [place]

## 2017-04-13 NOTE — Addendum Note (Signed)
Addended by: Trudie Buckler on: 04/13/2017 11:07 AM   Modules accepted: Orders

## 2017-04-13 NOTE — Telephone Encounter (Signed)
Notified pt Rx Adderall is ready to be pick up front office.

## 2017-04-26 DIAGNOSIS — R112 Nausea with vomiting, unspecified: Secondary | ICD-10-CM | POA: Diagnosis not present

## 2017-04-26 DIAGNOSIS — K219 Gastro-esophageal reflux disease without esophagitis: Secondary | ICD-10-CM | POA: Diagnosis not present

## 2017-04-26 DIAGNOSIS — K5900 Constipation, unspecified: Secondary | ICD-10-CM | POA: Diagnosis not present

## 2017-04-26 DIAGNOSIS — N8189 Other female genital prolapse: Secondary | ICD-10-CM | POA: Diagnosis not present

## 2017-04-27 DIAGNOSIS — G43719 Chronic migraine without aura, intractable, without status migrainosus: Secondary | ICD-10-CM | POA: Diagnosis not present

## 2017-04-27 DIAGNOSIS — G2581 Restless legs syndrome: Secondary | ICD-10-CM | POA: Diagnosis not present

## 2017-04-27 DIAGNOSIS — G894 Chronic pain syndrome: Secondary | ICD-10-CM | POA: Diagnosis not present

## 2017-05-10 DIAGNOSIS — H35033 Hypertensive retinopathy, bilateral: Secondary | ICD-10-CM | POA: Diagnosis not present

## 2017-05-11 DIAGNOSIS — I10 Essential (primary) hypertension: Secondary | ICD-10-CM | POA: Diagnosis not present

## 2017-05-11 DIAGNOSIS — Z23 Encounter for immunization: Secondary | ICD-10-CM | POA: Diagnosis not present

## 2017-05-11 DIAGNOSIS — N3281 Overactive bladder: Secondary | ICD-10-CM | POA: Diagnosis not present

## 2017-05-11 DIAGNOSIS — Z0000 Encounter for general adult medical examination without abnormal findings: Secondary | ICD-10-CM | POA: Diagnosis not present

## 2017-05-11 DIAGNOSIS — E7800 Pure hypercholesterolemia, unspecified: Secondary | ICD-10-CM | POA: Diagnosis not present

## 2017-05-11 DIAGNOSIS — F99 Mental disorder, not otherwise specified: Secondary | ICD-10-CM | POA: Diagnosis not present

## 2017-05-12 ENCOUNTER — Other Ambulatory Visit: Payer: Self-pay | Admitting: Neurology

## 2017-05-12 ENCOUNTER — Telehealth: Payer: Self-pay | Admitting: Neurology

## 2017-05-12 DIAGNOSIS — G4733 Obstructive sleep apnea (adult) (pediatric): Secondary | ICD-10-CM

## 2017-05-12 DIAGNOSIS — Z9989 Dependence on other enabling machines and devices: Secondary | ICD-10-CM

## 2017-05-12 DIAGNOSIS — G47419 Narcolepsy without cataplexy: Secondary | ICD-10-CM

## 2017-05-12 MED ORDER — AMPHETAMINE-DEXTROAMPHETAMINE 10 MG PO TABS: ORAL_TABLET | ORAL | 0 refills | Status: DC

## 2017-05-12 NOTE — Telephone Encounter (Signed)
Pt calling for refill of amphetamine-dextroamphetamine (ADDERALL) 10 MG tablet °

## 2017-05-12 NOTE — Telephone Encounter (Signed)
Script will be ready at the front for the patient at the front for pick up

## 2017-06-06 ENCOUNTER — Other Ambulatory Visit: Payer: Self-pay | Admitting: Pain Medicine

## 2017-06-06 DIAGNOSIS — M545 Low back pain, unspecified: Secondary | ICD-10-CM

## 2017-06-06 DIAGNOSIS — G8929 Other chronic pain: Secondary | ICD-10-CM

## 2017-06-08 ENCOUNTER — Telehealth: Payer: Self-pay | Admitting: Neurology

## 2017-06-08 ENCOUNTER — Other Ambulatory Visit: Payer: Self-pay | Admitting: Neurology

## 2017-06-08 DIAGNOSIS — G4733 Obstructive sleep apnea (adult) (pediatric): Secondary | ICD-10-CM

## 2017-06-08 DIAGNOSIS — G47419 Narcolepsy without cataplexy: Secondary | ICD-10-CM

## 2017-06-08 DIAGNOSIS — Z9989 Dependence on other enabling machines and devices: Secondary | ICD-10-CM

## 2017-06-08 MED ORDER — AMPHETAMINE-DEXTROAMPHETAMINE 10 MG PO TABS: ORAL_TABLET | ORAL | 0 refills | Status: DC

## 2017-06-08 NOTE — Telephone Encounter (Signed)
Medication will be ready for pickup after lunch

## 2017-06-08 NOTE — Telephone Encounter (Signed)
Pt calling for refill of amphetamine-dextroamphetamine (ADDERALL) 10 MG tablet °

## 2017-06-16 DIAGNOSIS — M50123 Cervical disc disorder at C6-C7 level with radiculopathy: Secondary | ICD-10-CM | POA: Diagnosis not present

## 2017-06-21 ENCOUNTER — Other Ambulatory Visit: Payer: Self-pay | Admitting: Family Medicine

## 2017-06-21 DIAGNOSIS — Z1231 Encounter for screening mammogram for malignant neoplasm of breast: Secondary | ICD-10-CM

## 2017-06-23 DIAGNOSIS — G43719 Chronic migraine without aura, intractable, without status migrainosus: Secondary | ICD-10-CM | POA: Diagnosis not present

## 2017-06-27 DIAGNOSIS — M50123 Cervical disc disorder at C6-C7 level with radiculopathy: Secondary | ICD-10-CM | POA: Diagnosis not present

## 2017-07-04 DIAGNOSIS — M50123 Cervical disc disorder at C6-C7 level with radiculopathy: Secondary | ICD-10-CM | POA: Diagnosis not present

## 2017-07-06 ENCOUNTER — Other Ambulatory Visit: Payer: Self-pay | Admitting: Neurology

## 2017-07-06 ENCOUNTER — Telehealth: Payer: Self-pay | Admitting: Neurology

## 2017-07-06 DIAGNOSIS — G47419 Narcolepsy without cataplexy: Secondary | ICD-10-CM

## 2017-07-06 DIAGNOSIS — Z9989 Dependence on other enabling machines and devices: Secondary | ICD-10-CM

## 2017-07-06 DIAGNOSIS — G4733 Obstructive sleep apnea (adult) (pediatric): Secondary | ICD-10-CM

## 2017-07-06 MED ORDER — AMPHETAMINE-DEXTROAMPHETAMINE 10 MG PO TABS: ORAL_TABLET | ORAL | 0 refills | Status: DC

## 2017-07-06 NOTE — Telephone Encounter (Signed)
Pt calling for refill of amphetamine-dextroamphetamine (ADDERALL) 10 MG tablet °

## 2017-07-06 NOTE — Telephone Encounter (Signed)
Prescription will be ready and placed at the front desk after lunch.

## 2017-07-11 ENCOUNTER — Ambulatory Visit: Payer: Federal, State, Local not specified - PPO

## 2017-07-13 ENCOUNTER — Ambulatory Visit: Payer: Federal, State, Local not specified - PPO

## 2017-07-13 DIAGNOSIS — K5900 Constipation, unspecified: Secondary | ICD-10-CM | POA: Diagnosis not present

## 2017-07-13 DIAGNOSIS — K210 Gastro-esophageal reflux disease with esophagitis: Secondary | ICD-10-CM | POA: Diagnosis not present

## 2017-07-13 DIAGNOSIS — R110 Nausea: Secondary | ICD-10-CM | POA: Diagnosis not present

## 2017-07-21 DIAGNOSIS — G8929 Other chronic pain: Secondary | ICD-10-CM | POA: Diagnosis not present

## 2017-07-21 DIAGNOSIS — G894 Chronic pain syndrome: Secondary | ICD-10-CM | POA: Diagnosis not present

## 2017-07-21 DIAGNOSIS — M961 Postlaminectomy syndrome, not elsewhere classified: Secondary | ICD-10-CM | POA: Diagnosis not present

## 2017-07-21 DIAGNOSIS — Z79891 Long term (current) use of opiate analgesic: Secondary | ICD-10-CM | POA: Diagnosis not present

## 2017-07-21 DIAGNOSIS — M5136 Other intervertebral disc degeneration, lumbar region: Secondary | ICD-10-CM | POA: Diagnosis not present

## 2017-07-29 DIAGNOSIS — K3184 Gastroparesis: Secondary | ICD-10-CM | POA: Diagnosis not present

## 2017-07-29 DIAGNOSIS — K5900 Constipation, unspecified: Secondary | ICD-10-CM | POA: Diagnosis not present

## 2017-07-29 DIAGNOSIS — G47419 Narcolepsy without cataplexy: Secondary | ICD-10-CM | POA: Diagnosis not present

## 2017-07-29 DIAGNOSIS — R112 Nausea with vomiting, unspecified: Secondary | ICD-10-CM | POA: Diagnosis not present

## 2017-07-29 DIAGNOSIS — F909 Attention-deficit hyperactivity disorder, unspecified type: Secondary | ICD-10-CM | POA: Diagnosis not present

## 2017-07-29 DIAGNOSIS — F419 Anxiety disorder, unspecified: Secondary | ICD-10-CM | POA: Diagnosis not present

## 2017-07-29 DIAGNOSIS — K219 Gastro-esophageal reflux disease without esophagitis: Secondary | ICD-10-CM | POA: Diagnosis not present

## 2017-07-29 DIAGNOSIS — K589 Irritable bowel syndrome without diarrhea: Secondary | ICD-10-CM | POA: Diagnosis not present

## 2017-07-29 DIAGNOSIS — G2581 Restless legs syndrome: Secondary | ICD-10-CM | POA: Diagnosis not present

## 2017-07-29 DIAGNOSIS — G8929 Other chronic pain: Secondary | ICD-10-CM | POA: Diagnosis not present

## 2017-07-29 DIAGNOSIS — E872 Acidosis: Secondary | ICD-10-CM | POA: Diagnosis not present

## 2017-07-29 DIAGNOSIS — J45909 Unspecified asthma, uncomplicated: Secondary | ICD-10-CM | POA: Diagnosis not present

## 2017-07-29 DIAGNOSIS — R1110 Vomiting, unspecified: Secondary | ICD-10-CM | POA: Diagnosis not present

## 2017-07-29 DIAGNOSIS — I10 Essential (primary) hypertension: Secondary | ICD-10-CM | POA: Diagnosis not present

## 2017-07-29 DIAGNOSIS — R1084 Generalized abdominal pain: Secondary | ICD-10-CM | POA: Diagnosis not present

## 2017-07-29 DIAGNOSIS — G43909 Migraine, unspecified, not intractable, without status migrainosus: Secondary | ICD-10-CM | POA: Diagnosis not present

## 2017-07-29 DIAGNOSIS — R109 Unspecified abdominal pain: Secondary | ICD-10-CM | POA: Diagnosis not present

## 2017-07-29 DIAGNOSIS — Z79899 Other long term (current) drug therapy: Secondary | ICD-10-CM | POA: Diagnosis not present

## 2017-07-30 DIAGNOSIS — I10 Essential (primary) hypertension: Secondary | ICD-10-CM | POA: Diagnosis not present

## 2017-07-30 DIAGNOSIS — E872 Acidosis: Secondary | ICD-10-CM | POA: Diagnosis not present

## 2017-07-30 DIAGNOSIS — K3184 Gastroparesis: Secondary | ICD-10-CM | POA: Diagnosis not present

## 2017-07-30 DIAGNOSIS — K219 Gastro-esophageal reflux disease without esophagitis: Secondary | ICD-10-CM | POA: Diagnosis not present

## 2017-08-03 DIAGNOSIS — R109 Unspecified abdominal pain: Secondary | ICD-10-CM | POA: Diagnosis not present

## 2017-08-03 DIAGNOSIS — G43719 Chronic migraine without aura, intractable, without status migrainosus: Secondary | ICD-10-CM | POA: Diagnosis not present

## 2017-08-03 DIAGNOSIS — G2581 Restless legs syndrome: Secondary | ICD-10-CM | POA: Diagnosis not present

## 2017-08-03 DIAGNOSIS — R1084 Generalized abdominal pain: Secondary | ICD-10-CM | POA: Diagnosis not present

## 2017-08-10 ENCOUNTER — Other Ambulatory Visit: Payer: Self-pay | Admitting: Neurology

## 2017-08-10 ENCOUNTER — Telehealth: Payer: Self-pay

## 2017-08-10 DIAGNOSIS — G47419 Narcolepsy without cataplexy: Secondary | ICD-10-CM

## 2017-08-10 DIAGNOSIS — Z9989 Dependence on other enabling machines and devices: Secondary | ICD-10-CM

## 2017-08-10 DIAGNOSIS — G4733 Obstructive sleep apnea (adult) (pediatric): Secondary | ICD-10-CM

## 2017-08-10 MED ORDER — AMPHETAMINE-DEXTROAMPHETAMINE 10 MG PO TABS: ORAL_TABLET | ORAL | 0 refills | Status: DC

## 2017-08-10 NOTE — Telephone Encounter (Signed)
Patient is calling for a refill on her Adderall. She would like a call once it is ready for pick up.

## 2017-08-10 NOTE — Telephone Encounter (Signed)
Called the pt and made her aware that her script will be ready for pick up. Pt verbalized understanding.

## 2017-08-22 DIAGNOSIS — K3184 Gastroparesis: Secondary | ICD-10-CM | POA: Diagnosis not present

## 2017-08-22 DIAGNOSIS — R109 Unspecified abdominal pain: Secondary | ICD-10-CM | POA: Diagnosis not present

## 2017-08-22 DIAGNOSIS — R1013 Epigastric pain: Secondary | ICD-10-CM | POA: Diagnosis not present

## 2017-08-23 ENCOUNTER — Encounter: Payer: Self-pay | Admitting: Adult Health

## 2017-08-24 ENCOUNTER — Encounter: Payer: Self-pay | Admitting: Adult Health

## 2017-08-24 ENCOUNTER — Ambulatory Visit (INDEPENDENT_AMBULATORY_CARE_PROVIDER_SITE_OTHER): Payer: Federal, State, Local not specified - PPO | Admitting: Adult Health

## 2017-08-24 VITALS — BP 148/99 | HR 94 | Ht 62.0 in | Wt 159.2 lb

## 2017-08-24 DIAGNOSIS — G4733 Obstructive sleep apnea (adult) (pediatric): Secondary | ICD-10-CM | POA: Diagnosis not present

## 2017-08-24 DIAGNOSIS — Z9989 Dependence on other enabling machines and devices: Secondary | ICD-10-CM

## 2017-08-24 DIAGNOSIS — G47419 Narcolepsy without cataplexy: Secondary | ICD-10-CM | POA: Diagnosis not present

## 2017-08-24 NOTE — Patient Instructions (Addendum)
Your Plan:  Start using CPAP nightly >4 hours  Continue Adderall  If your symptoms worsen or you develop new symptoms please let us know.    Thank you for coming to see [place] at [facility]. I hope we have been able to provide you high quality care today.  You may receive a patient satisfaction survey over the next few weeks. We would appreciate your feedback and comments so that we may continue to improve ourselves and the health of our patients.

## 2017-08-24 NOTE — Progress Notes (Signed)
PATIENT: [name] DOB: [birth year]  REASON FOR VISIT: follow up- osa on cpap, narcolepsy HISTORY FROM: patient  HISTORY OF PRESENT ILLNESS: Today 08/24/17  [name] is a 46 year old female with a history of obstructive sleep apnea on CPAP and narcolepsy.  She returns today for follow-up.  The patient CPAP download indicates that she has not been using her machine.  The download was printed for the last year.  The patient only use her machine 54 days.  She states that she did receive a new machine back over the summer.  Reports that since then she has had issues with gastroparesis and unable to use the machine.  She also reports that she was allergic to to mask.  She does state that she recently received a new mask but has not started using the machine again.  She continues to take Adderall for narcolepsy.  She returns today for an evaluation.  HISTORY 02/19/17: [name] is a 46 year old female with a history of obstructive sleep apnea on CPAP and narcolepsy. She returns today for follow-up. The patient CPAP machine does not hold data. That reason we've been unable to obtain a CPAP download. The patient will be given a prescription today to have a new machine. She did do a 10 day download from the machine and it showed that she as compliant. The patient continues to use Adderall 20 mg she reports that he recently she has noticed more daytime sleepiness. She states that she did get a little sleepy driving to the office visit today. She returns today for an evaluation.  REVIEW OF SYSTEMS: Out of a complete 14 system review of symptoms, the patient complains only of the following symptoms, and all other reviewed systems are negative.  ALLERGIES: Allergies  Allergen Reactions  . Adhesive [Tape] Rash    Glue from steri strips  . Aspirin Nausea Only  . Benadryl [Diphenhydramine Hcl] Other (See Comments)    Makes her hyper, jittery  . Codeine Nausea And Vomiting  . Dust Mite Extract Other  (See Comments)    Cough, sneeze, eyes runny  . Mold Extract [Trichophyton] Other (See Comments)    Cough, sneeze, eyes/nose runny  . Pollen Extract Other (See Comments)    Cough, sneeze, eyes/nose runny  . Prednisone Other (See Comments)    Makes her hyper, jittery   . Toradol [Ketorolac Tromethamine] Anxiety  . Zofran [Ondansetron] Nausea And Vomiting    HOME MEDICATIONS: Outpatient Medications Prior to Visit  Medication Sig Dispense Refill  . albuterol (PROVENTIL HFA;VENTOLIN HFA) 108 (90 BASE) MCG/ACT inhaler Inhale 2 puffs into the lungs every 6 (six) hours as needed for wheezing or shortness of breath.    . amphetamine-dextroamphetamine (ADDERALL) 10 MG tablet 2 tablets PO in AM and one tab at lunch. 90 tablet 0  . budesonide-formoterol (SYMBICORT) 160-4.5 MCG/ACT inhaler Inhale 2 puffs into the lungs 2 (two) times daily.      . Clobetasol Propionate (CLOBEX) 0.05 % shampoo Apply 1 application topically every Wednesday.     . cycloSPORINE (RESTASIS) 0.05 % ophthalmic emulsion Place 1 drop into both eyes every 12 (twelve) hours.      . diclofenac (VOLTAREN) 50 MG EC tablet Take 1 tablet (50 mg total) by mouth 2 (two) times daily. 60 tablet 0  . dicyclomine (BENTYL) 10 MG capsule Take 10 mg by mouth 4 (four) times daily as needed for spasms.    Marland Kitchen dronabinol (MARINOL) 5 MG capsule TK 1 C PO BID  3  . esomeprazole (NEXIUM) 40 MG capsule Take 40 mg by mouth daily at 12 noon.    Marland Kitchen FLUOCINOLONE ACETONIDE SCALP 0.01 % OIL Apply 1 application topically daily.  0  . fluocinonide (LIDEX) 0.05 % cream Apply 1 application topically 3 (three) times a week.     . gabapentin (NEURONTIN) 300 MG capsule Take 1 capsule (300 mg total) by mouth 3 (three) times daily. 90 capsule 2  . hydrochlorothiazide (HYDRODIURIL) 25 MG tablet Take 25 mg by mouth every morning.     . hydrocortisone (PROCTOCORT) 1 % CREA Apply topically.    . lidocaine (XYLOCAINE) 2 % jelly 1 application by Other route as needed  (rectally).    . LORazepam (ATIVAN) 0.5 MG tablet Take 0.5 mg by mouth every 6 (six) hours as needed for anxiety.    Marland Kitchen loteprednol (LOTEMAX) 0.2 % SUSP Place 1 drop into both eyes daily.    Marland Kitchen MYRBETRIQ 25 MG TB24 tablet TK 1 T PO QD  3  . nitroGLYCERIN (NITROGLYN) 2 % ointment Apply 1 inch topically at bedtime.    . Olopatadine HCl (PATADAY) 0.2 % SOLN Place 1 drop into both eyes 2 (two) times daily as needed (for allergies).     . OnabotulinumtoxinA (BOTOX IJ) Inject as directed every 3 (three) months.    . Plecanatide (TRULANCE) 3 MG TABS Take by mouth.    . pramipexole (MIRAPEX) 1 MG tablet Take 1 mg by mouth at bedtime.  0  . PREMARIN 0.3 MG tablet Take 0.3 mg by mouth daily.    . promethazine (PHENERGAN) 25 MG tablet Take 50 mg by mouth every 6 (six) hours as needed for nausea or vomiting.   3  . PROMETHEGAN 25 MG suppository PLACE 1 SUPPOSITORY RECTALLY Q 6 H PRN N FOR UP TO 7 DAYS  11  . RECTIV 0.4 % OINT APPLY PEA-SIZED AMOUNT TO RECTAL AREA NIGHTLY FOR 14 DAYS FOR FISSURE TREATMENT.  0  . RELPAX 40 MG tablet Take 40 mg by mouth every 2 (two) hours as needed for migraine or headache.     . saccharomyces boulardii (FLORASTOR) 250 MG capsule Take 1 capsule (250 mg total) by mouth 2 (two) times daily. 60 capsule 1  . tapentadol (NUCYNTA) 50 MG tablet Take 100 mg by mouth.     . terconazole (TERAZOL 7) 0.4 % vaginal cream Place 1 applicator vaginally daily.    Marland Kitchen tiZANidine (ZANAFLEX) 4 MG tablet Take 1 tablet (4 mg total) by mouth every 8 (eight) hours as needed for muscle spasms. 90 tablet 2  . topiramate (TOPAMAX) 100 MG tablet Take 100-200 mg by mouth 2 (two) times daily. 1 tab every AM and 2 at night    . triamcinolone cream (KENALOG) 0.1 % Apply 1 application topically daily as needed (to affected area).     . verapamil (VERELAN PM) 240 MG 24 hr capsule Take 240 mg by mouth at bedtime.      . Vitamin D, Ergocalciferol, (DRISDOL) 50000 UNITS CAPS capsule Take 50,000 Units by mouth every  7 (seven) days.     Marland Kitchen LINZESS 290 MCG CAPS capsule Reported on 12/15/2015  3  . lurasidone (LATUDA) 40 MG TABS Take 40 mg by mouth at bedtime. Reported on 08/28/2015     No facility-administered medications prior to visit.     PAST MEDICAL HISTORY: Past Medical History:  Diagnosis Date  . Allergy   . Anxiety   . Asthma   . Bipolar 1  disorder ([place])   . Chronic lower back pain 02/07/2014  . Depression    excessive daytime sleepiness-MSLT confirmed pathological degree of hypersomnia-12/13  . Esophageal reflux   . Gastroparesis   . Hypersomnia   . Hypertension   . Medication monitoring encounter 02/07/2014  . Migraine   . Migraine   . Migraines   . Narcolepsy   . OCD (obsessive compulsive disorder)   . Opiate use 07/02/2015  . Osteoarthritis    knees, hands, shoulder, lower back  . Other iron deficiency anemias 08/28/2015  . Overactive bladder   . Psychosis ([place])   . Restless leg syndrome   . S/P lumbar fusion 2011  . Schizophrenia ([place])   . Schizophrenia, schizo-affective ([place])   . Seasonal allergies   . Sleep apnea    uses CPAP every night  . Torn rotator cuff it:2012,rt:2013    PAST SURGICAL HISTORY: Past Surgical History:  Procedure Laterality Date  . ABDOMINAL HYSTERECTOMY  2001  . ANKLE FUSION     right  . CHOLECYSTECTOMY N/A 07/20/2013   Procedure: LAPAROSCOPIC CHOLECYSTECTOMY;  Surgeon: Harl Bowie, MD;  Location: [facility];  Service: General;  Laterality: N/A;  . COLONOSCOPY WITH PROPOFOL N/A 10/02/2013   Procedure: COLONOSCOPY WITH PROPOFOL;  Surgeon: Garlan Fair, MD;  Location: [facility];  Service: Endoscopy;  Laterality: N/A;  . ESOPHAGOGASTRODUODENOSCOPY (EGD) WITH PROPOFOL N/A 10/02/2013   Procedure: ESOPHAGOGASTRODUODENOSCOPY (EGD) WITH PROPOFOL;  Surgeon: Garlan Fair, MD;  Location: [facility];  Service: Endoscopy;  Laterality: N/A;  . FLEXIBLE SIGMOIDOSCOPY Left 04/07/2015   Procedure: FLEXIBLE SIGMOIDOSCOPY;  Surgeon: Ronald Lobo, MD;   Location: [facility];  Service: Endoscopy;  Laterality: Left;  . FRACTURE SURGERY    . LAPAROSCOPIC SALPINGO OOPHERECTOMY Left 12/05/2013   Procedure: LAPAROSCOPIC SALPINGO OOPHORECTOMY;  Surgeon: Thurnell Lose, MD;  Location: [facility];  Service: Gynecology;  Laterality: Left;  . LUMBAR FUSION  2011   L4-L5  . OOPHORECTOMY   2004   right  . ROTATOR CUFF REPAIR  2012,2013   torn   . SHOULDER SURGERY     left and right , bone spurs, torn muscle  . SPINE SURGERY    . spurs,ruptured disc bone  2009  . TUBAL LIGATION  2004  . VESICOVAGINAL FISTULA CLOSURE W/ TAH  2001  . WISDOM TOOTH EXTRACTION      FAMILY HISTORY: Family History  Problem Relation Age of Onset  . Cancer Mother        breast  . Cancer Father        prostate,colon  . Hypertension Father   . Thyroid disease Father   . High Cholesterol Father   . Thyroid disease Sister   . Cancer Paternal Aunt        colon  . Cancer Maternal Grandmother        kidney  . Cancer Paternal Grandmother        colon  . Cancer Paternal Aunt        lung  . Narcolepsy Neg Hx     SOCIAL HISTORY: Social History   Socioeconomic History  . Marital status: Married    Spouse name: Elberta Fortis  . Number of children: 3  . Years of education: Master's  . Highest education level: Not on file  Social Needs  . Financial resource strain: Not on file  . Food insecurity - worry: Not on file  . Food insecurity - inability: Not on file  . Transportation needs - medical: Not on  file  . Transportation needs - non-medical: Not on file  Occupational History  . Occupation: unemployed    Fish farm manager: UNEMPLOYED  Tobacco Use  . Smoking status: Never Smoker  . Smokeless tobacco: Never Used  Substance and Sexual Activity  . Alcohol use: No  . Drug use: No  . Sexual activity: Yes    Birth control/protection: Surgical  Other Topics Concern  . Not on file  Social History Narrative    This patient has an RDI of 12 and AHI of 4.8,  titrated to 6 cm  water after PSG, she did qualify for MSLT, Epworth 22 today on 6 cm water.      Based on her MSLT  of 3.6 minutes without SREM and Epworth of 22 on CPAP with residual AHI of 1.8, she should be treated  in a trial base for narcolepsy. She is on many REM supressant medications, and may not be able to  express the narcolepsy in an MSLT.   Patient is married Elberta Fortis) and lives at home with her husband and three children.   Patient is currently unemployed.   Patient has a Master's degree.   Patient is right-handed.   Patient drinks two cups of coffee on average but none lately for 6 months, 3 cups of tea daily.      PHYSICAL EXAM  Vitals:   08/24/17 1421  BP: (!) 148/99  Pulse: 94  Weight: 159 lb 3.2 oz (72.2 kg)  Height: 5\' 2"  (1.575 m)   Body mass index is 29.12 kg/m.  Generalized: Well developed, in no acute distress   Neurological examination  Mentation: Alert oriented to time, place, history taking. Follows all commands speech and language fluent Cranial nerve II-XII: Pupils were equal round reactive to light. Extraocular movements were full, visual field were full on confrontational test. Facial sensation and strength were normal. Uvula tongue midline. Head turning and shoulder shrug  were normal and symmetric. Motor: The motor testing reveals 5 over 5 strength of all 4 extremities. Good symmetric motor tone is noted throughout.  Sensory: Sensory testing is intact to soft touch on all 4 extremities. No evidence of extinction is noted.  Coordination: Cerebellar testing reveals good finger-nose-finger and heel-to-shin bilaterally.  Gait and station: Gait is normal.  Reflexes: Deep tendon reflexes are symmetric and normal bilaterally.   DIAGNOSTIC DATA (LABS, IMAGING, TESTING) - I reviewed patient records, labs, notes, testing and imaging myself where available.  Lab Results  Component Value Date   WBC 13.1 (H) 11/14/2016   HGB 13.5 11/14/2016   HCT 39.4 11/14/2016   MCV  78.0 11/14/2016   PLT 286 11/14/2016      Component Value Date/Time   NA 137 11/14/2016 1542   NA 143 08/28/2015 1557   K 4.7 11/14/2016 1542   CL 99 (L) 11/14/2016 1542   CO2 28 11/14/2016 1542   GLUCOSE 127 (H) 11/14/2016 1542   BUN 8 11/14/2016 1542   BUN 7 08/28/2015 1557   CREATININE 0.79 11/14/2016 1542   CALCIUM 10.0 11/14/2016 1542   PROT 9.2 (H) 11/14/2016 1542   PROT 7.1 08/28/2015 1557   ALBUMIN 4.7 11/14/2016 1542   ALBUMIN 4.3 08/28/2015 1557   AST 21 11/14/2016 1542   ALT 19 11/14/2016 1542   ALKPHOS 61 11/14/2016 1542   BILITOT 0.7 11/14/2016 1542   BILITOT 0.2 08/28/2015 1557   GFRNONAA >60 11/14/2016 1542   GFRAA >60 11/14/2016 1542      ASSESSMENT AND PLAN 46 y.o.  year old female  has a past medical history of Allergy, Anxiety, Asthma, Bipolar 1 disorder ([place]), Chronic lower back pain (02/07/2014), Depression, Esophageal reflux, Gastroparesis, Hypersomnia, Hypertension, Medication monitoring encounter (02/07/2014), Migraine, Migraine, Migraines, Narcolepsy, OCD (obsessive compulsive disorder), Opiate use (07/02/2015), Osteoarthritis, Other iron deficiency anemias (08/28/2015), Overactive bladder, Psychosis ([place]), Restless leg syndrome, S/P lumbar fusion (2011), Schizophrenia ([place]), Schizophrenia, schizo-affective ([place]), Seasonal allergies, Sleep apnea, and Torn rotator cuff (it:2012,rt:2013). here with:  1.  Obstructive sleep apnea on CPAP 2.  Narcolepsy  I advised the patient that she should start using the CPAP nightly and for greater than 4 hours each night.  I discussed the risk associated with untreated sleep apnea.  She voiced understanding.  She will continue on Adderall for narcolepsy.  Advised that if her symptoms worsen or she develops new symptoms she should let us know.  She will follow-up in 3 months for another download.    Ward Givens, MSN, NP-C 08/24/2017, 2:41 PM [facility] [address], [place], [place]  [address] [phone]

## 2017-08-25 NOTE — Progress Notes (Signed)
I agree with the assessment and plan as directed by NP .The patient is known to me .   Derwin Reddy, MD  

## 2017-09-14 DIAGNOSIS — R1013 Epigastric pain: Secondary | ICD-10-CM | POA: Diagnosis not present

## 2017-09-14 DIAGNOSIS — R109 Unspecified abdominal pain: Secondary | ICD-10-CM | POA: Diagnosis not present

## 2017-09-14 DIAGNOSIS — K3184 Gastroparesis: Secondary | ICD-10-CM | POA: Diagnosis not present

## 2017-09-19 ENCOUNTER — Telehealth: Payer: Self-pay | Admitting: Adult Health

## 2017-09-19 DIAGNOSIS — G4733 Obstructive sleep apnea (adult) (pediatric): Secondary | ICD-10-CM

## 2017-09-19 DIAGNOSIS — Z9989 Dependence on other enabling machines and devices: Secondary | ICD-10-CM

## 2017-09-19 DIAGNOSIS — G47419 Narcolepsy without cataplexy: Secondary | ICD-10-CM

## 2017-09-19 MED ORDER — AMPHETAMINE-DEXTROAMPHETAMINE 10 MG PO TABS: ORAL_TABLET | ORAL | 0 refills | Status: DC

## 2017-09-19 NOTE — Telephone Encounter (Signed)
Printed, signed and given to [place]

## 2017-09-19 NOTE — Telephone Encounter (Signed)
Pt request refill for amphetamine-dextroamphetamine (ADDERALL) 10 MG tablet °

## 2017-09-19 NOTE — Telephone Encounter (Signed)
Adderall refill Rx at front desk for pick up.

## 2017-09-20 ENCOUNTER — Ambulatory Visit: Payer: Federal, State, Local not specified - PPO

## 2017-10-05 ENCOUNTER — Telehealth: Payer: Self-pay | Admitting: *Deleted

## 2017-10-05 NOTE — Telephone Encounter (Signed)
Per fax received from [facility], called to initiate PA for Adderall, spoke with [facility] and received approval, Adderall 10 mg tabs, takes total of 3 tabs daily. Approved 10/05/17 through 10/05/2018. She stated that patient must receive medication from a pharmacy.  [facility], spoke with Clair Gulling and gave him PA dates. He ran medication through without issue. He verbalized understanding of call.

## 2017-10-07 NOTE — Telephone Encounter (Signed)
PA approval for adderall 10mg  tabs.  #270 every 90 days.  09/05/2017 thru 10/05/2018.  [employer].  [phone].

## 2017-10-24 DIAGNOSIS — M545 Low back pain: Secondary | ICD-10-CM | POA: Diagnosis not present

## 2017-10-24 DIAGNOSIS — Z79891 Long term (current) use of opiate analgesic: Secondary | ICD-10-CM | POA: Diagnosis not present

## 2017-11-02 DIAGNOSIS — K5909 Other constipation: Secondary | ICD-10-CM | POA: Diagnosis not present

## 2017-11-02 DIAGNOSIS — R140 Abdominal distension (gaseous): Secondary | ICD-10-CM | POA: Diagnosis not present

## 2017-11-02 DIAGNOSIS — R110 Nausea: Secondary | ICD-10-CM | POA: Diagnosis not present

## 2017-11-08 DIAGNOSIS — N3281 Overactive bladder: Secondary | ICD-10-CM | POA: Diagnosis not present

## 2017-11-08 DIAGNOSIS — I10 Essential (primary) hypertension: Secondary | ICD-10-CM | POA: Diagnosis not present

## 2017-11-11 DIAGNOSIS — G43719 Chronic migraine without aura, intractable, without status migrainosus: Secondary | ICD-10-CM | POA: Diagnosis not present

## 2017-11-11 DIAGNOSIS — G43009 Migraine without aura, not intractable, without status migrainosus: Secondary | ICD-10-CM | POA: Diagnosis not present

## 2017-12-06 ENCOUNTER — Ambulatory Visit: Payer: Federal, State, Local not specified - PPO | Admitting: Adult Health

## 2017-12-20 ENCOUNTER — Telehealth: Payer: Self-pay | Admitting: Adult Health

## 2017-12-20 ENCOUNTER — Other Ambulatory Visit: Payer: Self-pay | Admitting: Neurology

## 2017-12-20 DIAGNOSIS — Z9989 Dependence on other enabling machines and devices: Secondary | ICD-10-CM

## 2017-12-20 DIAGNOSIS — G4733 Obstructive sleep apnea (adult) (pediatric): Secondary | ICD-10-CM

## 2017-12-20 DIAGNOSIS — G47419 Narcolepsy without cataplexy: Secondary | ICD-10-CM

## 2017-12-20 MED ORDER — AMPHETAMINE-DEXTROAMPHETAMINE 10 MG PO TABS: ORAL_TABLET | ORAL | 0 refills | Status: DC

## 2017-12-20 NOTE — Telephone Encounter (Signed)
Have sent to the NP for refill request to be sent to [facility].

## 2017-12-20 NOTE — Telephone Encounter (Signed)
Pt requesting a refill for amphetamine-dextroamphetamine (ADDERALL) 10 MG tablet sent to [facility]

## 2017-12-22 DIAGNOSIS — G43009 Migraine without aura, not intractable, without status migrainosus: Secondary | ICD-10-CM | POA: Diagnosis not present

## 2017-12-22 DIAGNOSIS — G47411 Narcolepsy with cataplexy: Secondary | ICD-10-CM | POA: Diagnosis not present

## 2017-12-22 DIAGNOSIS — G43719 Chronic migraine without aura, intractable, without status migrainosus: Secondary | ICD-10-CM | POA: Diagnosis not present

## 2018-02-08 DIAGNOSIS — K5909 Other constipation: Secondary | ICD-10-CM | POA: Diagnosis not present

## 2018-02-08 DIAGNOSIS — R110 Nausea: Secondary | ICD-10-CM | POA: Diagnosis not present

## 2018-02-08 DIAGNOSIS — K219 Gastro-esophageal reflux disease without esophagitis: Secondary | ICD-10-CM | POA: Diagnosis not present

## 2018-03-02 DIAGNOSIS — R12 Heartburn: Secondary | ICD-10-CM | POA: Diagnosis not present

## 2018-03-02 DIAGNOSIS — K219 Gastro-esophageal reflux disease without esophagitis: Secondary | ICD-10-CM | POA: Diagnosis not present

## 2018-03-02 DIAGNOSIS — R110 Nausea: Secondary | ICD-10-CM | POA: Diagnosis not present

## 2018-03-02 DIAGNOSIS — R1310 Dysphagia, unspecified: Secondary | ICD-10-CM | POA: Diagnosis not present

## 2018-03-07 DIAGNOSIS — R1310 Dysphagia, unspecified: Secondary | ICD-10-CM | POA: Diagnosis not present

## 2018-03-07 DIAGNOSIS — R12 Heartburn: Secondary | ICD-10-CM | POA: Diagnosis not present

## 2018-03-07 DIAGNOSIS — R110 Nausea: Secondary | ICD-10-CM | POA: Diagnosis not present

## 2018-03-15 ENCOUNTER — Ambulatory Visit: Payer: Federal, State, Local not specified - PPO | Admitting: Adult Health

## 2018-03-15 ENCOUNTER — Encounter: Payer: Self-pay | Admitting: Adult Health

## 2018-03-15 ENCOUNTER — Encounter

## 2018-03-15 DIAGNOSIS — G47419 Narcolepsy without cataplexy: Secondary | ICD-10-CM | POA: Diagnosis not present

## 2018-03-15 DIAGNOSIS — Z9989 Dependence on other enabling machines and devices: Secondary | ICD-10-CM | POA: Diagnosis not present

## 2018-03-15 DIAGNOSIS — G4733 Obstructive sleep apnea (adult) (pediatric): Secondary | ICD-10-CM

## 2018-03-15 MED ORDER — AMPHETAMINE-DEXTROAMPHETAMINE 10 MG PO TABS: ORAL_TABLET | ORAL | 0 refills | Status: DC

## 2018-03-15 NOTE — Patient Instructions (Signed)
Your Plan:  Restart CPAP adderall refilled If your symptoms worsen or you develop new symptoms please let us know.    Thank you for coming to see [place] at [facility]. I hope we have been able to provide you high quality care today.  You may receive a patient satisfaction survey over the next few weeks. We would appreciate your feedback and comments so that we may continue to improve ourselves and the health of our patients.

## 2018-03-15 NOTE — Progress Notes (Signed)
PATIENT: [name] DOB: 11/26/1970  REASON FOR VISIT: follow up HISTORY FROM: patient  HISTORY OF PRESENT ILLNESS: Today 03/15/18 [name] is a 47 year old female with a history of obstructive sleep apnea on CPAP and narcolepsy.  She returns today for follow-up.  The patient reports that she had a NG tube placed in January due to digestive issues.  She recently got this removed on July 5th.  She states that she had a tube she was not using the CPAP machine.  She states that she has continue using Adderall for narcolepsy.  She states that if she is having a good day she would forego using the medicine as she can.  She returns today for evaluation.  HISTORY Today 08/24/17  [name] is a 47 year old female with a history of obstructive sleep apnea on CPAP and narcolepsy.  She returns today for follow-up.  The patient CPAP download indicates that she has not been using her machine.  The download was printed for the last year.  The patient only use her machine 54 days.  She states that she did receive a new machine back over the summer.  Reports that since then she has had issues with gastroparesis and unable to use the machine.  She also reports that she was allergic to to mask.  She does state that she recently received a new mask but has not started using the machine again.  She continues to take Adderall for narcolepsy.  She returns today for an evaluation.   REVIEW OF SYSTEMS: Out of a complete 14 system review of symptoms, the patient complains only of the following symptoms, and all other reviewed systems are negative.  See HPI  ALLERGIES: Allergies  Allergen Reactions  . Adhesive [Tape] Rash    Glue from steri strips  . Aspirin Nausea Only  . Benadryl [Diphenhydramine Hcl] Other (See Comments)    Makes her hyper, jittery  . Codeine Nausea And Vomiting  . Dust Mite Extract Other (See Comments)    Cough, sneeze, eyes runny  . Mold Extract [Trichophyton] Other (See Comments)     Cough, sneeze, eyes/nose runny  . Pollen Extract Other (See Comments)    Cough, sneeze, eyes/nose runny  . Prednisone Other (See Comments)    Makes her hyper, jittery   . Toradol [Ketorolac Tromethamine] Anxiety  . Zofran [Ondansetron] Nausea And Vomiting    HOME MEDICATIONS: Outpatient Medications Prior to Visit  Medication Sig Dispense Refill  . albuterol (PROVENTIL HFA;VENTOLIN HFA) 108 (90 BASE) MCG/ACT inhaler Inhale 2 puffs into the lungs every 6 (six) hours as needed for wheezing or shortness of breath.    . amphetamine-dextroamphetamine (ADDERALL) 10 MG tablet 2 tablets PO in AM and one tab at lunch. 90 tablet 0  . budesonide-formoterol (SYMBICORT) 160-4.5 MCG/ACT inhaler Inhale 2 puffs into the lungs 2 (two) times daily.      . Clobetasol Propionate (CLOBEX) 0.05 % shampoo Apply 1 application topically every Wednesday.     . cycloSPORINE (RESTASIS) 0.05 % ophthalmic emulsion Place 1 drop into both eyes every 12 (twelve) hours.      . diclofenac (VOLTAREN) 50 MG EC tablet Take 1 tablet (50 mg total) by mouth 2 (two) times daily. 60 tablet 0  . dicyclomine (BENTYL) 10 MG capsule Take 10 mg by mouth 4 (four) times daily as needed for spasms.    Marland Kitchen dronabinol (MARINOL) 5 MG capsule TK 1 C PO BID  3  . esomeprazole (NEXIUM) 40 MG  capsule Take 40 mg by mouth daily at 12 noon.    Marland Kitchen FLUOCINOLONE ACETONIDE SCALP 0.01 % OIL Apply 1 application topically daily.  0  . fluocinonide (LIDEX) 0.05 % cream Apply 1 application topically 3 (three) times a week.     . gabapentin (NEURONTIN) 300 MG capsule Take 1 capsule (300 mg total) by mouth 3 (three) times daily. 90 capsule 2  . hydrochlorothiazide (HYDRODIURIL) 25 MG tablet Take 25 mg by mouth every morning.     . hydrocortisone (PROCTOCORT) 1 % CREA Apply topically.    . lidocaine (XYLOCAINE) 2 % jelly 1 application by Other route as needed (rectally).    . LORazepam (ATIVAN) 0.5 MG tablet Take 0.5 mg by mouth every 6 (six) hours as needed  for anxiety.    Marland Kitchen loteprednol (LOTEMAX) 0.2 % SUSP Place 1 drop into both eyes daily.    Marland Kitchen MYRBETRIQ 25 MG TB24 tablet TK 1 T PO QD  3  . nitroGLYCERIN (NITROGLYN) 2 % ointment Apply 1 inch topically at bedtime.    . Olopatadine HCl (PATADAY) 0.2 % SOLN Place 1 drop into both eyes 2 (two) times daily as needed (for allergies).     . OnabotulinumtoxinA (BOTOX IJ) Inject as directed every 3 (three) months.    . pramipexole (MIRAPEX) 1 MG tablet Take 1 mg by mouth at bedtime.  0  . PREMARIN 0.3 MG tablet Take 0.3 mg by mouth daily.    . promethazine (PHENERGAN) 25 MG tablet Take 50 mg by mouth every 6 (six) hours as needed for nausea or vomiting.   3  . PROMETHEGAN 25 MG suppository PLACE 1 SUPPOSITORY RECTALLY Q 6 H PRN N FOR UP TO 7 DAYS  11  . RECTIV 0.4 % OINT APPLY PEA-SIZED AMOUNT TO RECTAL AREA NIGHTLY FOR 14 DAYS FOR FISSURE TREATMENT.  0  . RELPAX 40 MG tablet Take 40 mg by mouth every 2 (two) hours as needed for migraine or headache.     . saccharomyces boulardii (FLORASTOR) 250 MG capsule Take 1 capsule (250 mg total) by mouth 2 (two) times daily. 60 capsule 1  . Tapentadol HCl 100 MG TABS Take 100 mg by mouth.     . terconazole (TERAZOL 7) 0.4 % vaginal cream Place 1 applicator vaginally daily.    Marland Kitchen tiZANidine (ZANAFLEX) 4 MG tablet Take 1 tablet (4 mg total) by mouth every 8 (eight) hours as needed for muscle spasms. 90 tablet 2  . topiramate (TOPAMAX) 100 MG tablet Take 100-200 mg by mouth 2 (two) times daily. 1 tab every AM and 2 at night    . triamcinolone cream (KENALOG) 0.1 % Apply 1 application topically daily as needed (to affected area).     . verapamil (VERELAN PM) 240 MG 24 hr capsule Take 240 mg by mouth at bedtime.      . Vitamin D, Ergocalciferol, (DRISDOL) 50000 UNITS CAPS capsule Take 50,000 Units by mouth every 7 (seven) days.     Marland Kitchen Plecanatide (TRULANCE) 3 MG TABS Take by mouth.     No facility-administered medications prior to visit.     PAST MEDICAL  HISTORY: Past Medical History:  Diagnosis Date  . Allergy   . Anxiety   . Asthma   . Bipolar 1 disorder ([place])   . Chronic lower back pain 02/07/2014  . Depression    excessive daytime sleepiness-MSLT confirmed pathological degree of hypersomnia-12/13  . Esophageal reflux   . Gastroparesis   . Hypersomnia   .  Hypertension   . Medication monitoring encounter 02/07/2014  . Migraine   . Migraine   . Migraines   . Narcolepsy   . OCD (obsessive compulsive disorder)   . Opiate use 07/02/2015  . Osteoarthritis    knees, hands, shoulder, lower back  . Other iron deficiency anemias 08/28/2015  . Overactive bladder   . Psychosis ([place])   . Restless leg syndrome   . S/P lumbar fusion 2011  . Schizophrenia ([place])   . Schizophrenia, schizo-affective ([place])   . Seasonal allergies   . Sleep apnea    uses CPAP every night  . Torn rotator cuff it:2012,rt:2013    PAST SURGICAL HISTORY: Past Surgical History:  Procedure Laterality Date  . ABDOMINAL HYSTERECTOMY  2001  . ANKLE FUSION     right  . CHOLECYSTECTOMY N/A 07/20/2013   Procedure: LAPAROSCOPIC CHOLECYSTECTOMY;  Surgeon: Harl Bowie, MD;  Location: [facility];  Service: General;  Laterality: N/A;  . COLONOSCOPY WITH PROPOFOL N/A 10/02/2013   Procedure: COLONOSCOPY WITH PROPOFOL;  Surgeon: Garlan Fair, MD;  Location: [facility];  Service: Endoscopy;  Laterality: N/A;  . ESOPHAGOGASTRODUODENOSCOPY (EGD) WITH PROPOFOL N/A 10/02/2013   Procedure: ESOPHAGOGASTRODUODENOSCOPY (EGD) WITH PROPOFOL;  Surgeon: Garlan Fair, MD;  Location: [facility];  Service: Endoscopy;  Laterality: N/A;  . FLEXIBLE SIGMOIDOSCOPY Left 04/07/2015   Procedure: FLEXIBLE SIGMOIDOSCOPY;  Surgeon: Ronald Lobo, MD;  Location: [facility];  Service: Endoscopy;  Laterality: Left;  . FRACTURE SURGERY    . LAPAROSCOPIC SALPINGO OOPHERECTOMY Left 12/05/2013   Procedure: LAPAROSCOPIC SALPINGO OOPHORECTOMY;  Surgeon: Thurnell Lose, MD;  Location: [facility];   Service: Gynecology;  Laterality: Left;  . LUMBAR FUSION  2011   L4-L5  . OOPHORECTOMY   2004   right  . ROTATOR CUFF REPAIR  2012,2013   torn   . SHOULDER SURGERY     left and right , bone spurs, torn muscle  . SPINE SURGERY    . spurs,ruptured disc bone  2009  . TUBAL LIGATION  2004  . VESICOVAGINAL FISTULA CLOSURE W/ TAH  2001  . WISDOM TOOTH EXTRACTION      FAMILY HISTORY: Family History  Problem Relation Age of Onset  . Cancer Mother        breast  . Cancer Father        prostate,colon  . Hypertension Father   . Thyroid disease Father   . High Cholesterol Father   . Thyroid disease Sister   . Cancer Paternal Aunt        colon  . Cancer Maternal Grandmother        kidney  . Cancer Paternal Grandmother        colon  . Cancer Paternal Aunt        lung  . Narcolepsy Neg Hx     SOCIAL HISTORY: Social History   Socioeconomic History  . Marital status: Married    Spouse name: Elberta Fortis  . Number of children: 3  . Years of education: Master's  . Highest education level: Not on file  Occupational History  . Occupation: unemployed    Fish farm manager: UNEMPLOYED  Social Needs  . Financial resource strain: Not on file  . Food insecurity:    Worry: Not on file    Inability: Not on file  . Transportation needs:    Medical: Not on file    Non-medical: Not on file  Tobacco Use  . Smoking status: Never Smoker  . Smokeless tobacco: Never Used  Substance  and Sexual Activity  . Alcohol use: No  . Drug use: No  . Sexual activity: Yes    Birth control/protection: Surgical  Lifestyle  . Physical activity:    Days per week: Not on file    Minutes per session: Not on file  . Stress: Not on file  Relationships  . Social connections:    Talks on phone: Not on file    Gets together: Not on file    Attends religious service: Not on file    Active member of club or organization: Not on file    Attends meetings of clubs or organizations: Not on file    Relationship status:  Not on file  . Intimate partner violence:    Fear of current or ex partner: Not on file    Emotionally abused: Not on file    Physically abused: Not on file    Forced sexual activity: Not on file  Other Topics Concern  . Not on file  Social History Narrative    This patient has an RDI of 12 and AHI of 4.8,  titrated to 6 cm water after PSG, she did qualify for MSLT, Epworth 22 today on 6 cm water.      Based on her MSLT  of 3.6 minutes without SREM and Epworth of 22 on CPAP with residual AHI of 1.8, she should be treated  in a trial base for narcolepsy. She is on many REM supressant medications, and may not be able to  express the narcolepsy in an MSLT.   Patient is married Elberta Fortis) and lives at home with her husband and three children.   Patient is currently unemployed.   Patient has a Master's degree.   Patient is right-handed.   Patient drinks two cups of coffee on average but none lately for 6 months, 3 cups of tea daily.      PHYSICAL EXAM  Vitals:   03/15/18 1405  BP: (!) 150/99  Pulse: 92  Weight: 165 lb (74.8 kg)  Height: 5\' 2"  (1.575 m)   Body mass index is 30.18 kg/m.  Generalized: Well developed, in no acute distress   Neurological examination  Mentation: Alert oriented to time, place, history taking. Follows all commands speech and language fluent Cranial nerve II-XII: Pupils were equal round reactive to light. Extraocular movements were full, visual field were full on confrontational test. Facial sensation and strength were normal. Uvula tongue midline. Head turning and shoulder shrug  were normal and symmetric. Motor: The motor testing reveals 5 over 5 strength of all 4 extremities. Good symmetric motor tone is noted throughout.  Sensory: Sensory testing is intact to soft touch on all 4 extremities. No evidence of extinction is noted.  Coordination: Cerebellar testing reveals good finger-nose-finger and heel-to-shin bilaterally.  Gait and station: Gait is  normal.  Reflexes: Deep tendon reflexes are symmetric and normal bilaterally.   DIAGNOSTIC DATA (LABS, IMAGING, TESTING) - I reviewed patient records, labs, notes, testing and imaging myself where available.  Lab Results  Component Value Date   WBC 13.1 (H) 11/14/2016   HGB 13.5 11/14/2016   HCT 39.4 11/14/2016   MCV 78.0 11/14/2016   PLT 286 11/14/2016      Component Value Date/Time   NA 137 11/14/2016 1542   NA 143 08/28/2015 1557   K 4.7 11/14/2016 1542   CL 99 (L) 11/14/2016 1542   CO2 28 11/14/2016 1542   GLUCOSE 127 (H) 11/14/2016 1542   BUN 8 11/14/2016 1542  BUN 7 08/28/2015 1557   CREATININE 0.79 11/14/2016 1542   CALCIUM 10.0 11/14/2016 1542   PROT 9.2 (H) 11/14/2016 1542   PROT 7.1 08/28/2015 1557   ALBUMIN 4.7 11/14/2016 1542   ALBUMIN 4.3 08/28/2015 1557   AST 21 11/14/2016 1542   ALT 19 11/14/2016 1542   ALKPHOS 61 11/14/2016 1542   BILITOT 0.7 11/14/2016 1542   BILITOT 0.2 08/28/2015 1557   GFRNONAA >60 11/14/2016 1542   GFRAA >60 11/14/2016 1542      ASSESSMENT AND PLAN 47 y.o. year old female  has a past medical history of Allergy, Anxiety, Asthma, Bipolar 1 disorder ([place]), Chronic lower back pain (02/07/2014), Depression, Esophageal reflux, Gastroparesis, Hypersomnia, Hypertension, Medication monitoring encounter (02/07/2014), Migraine, Migraine, Migraines, Narcolepsy, OCD (obsessive compulsive disorder), Opiate use (07/02/2015), Osteoarthritis, Other iron deficiency anemias (08/28/2015), Overactive bladder, Psychosis ([place]), Restless leg syndrome, S/P lumbar fusion (2011), Schizophrenia ([place]), Schizophrenia, schizo-affective ([place]), Seasonal allergies, Sleep apnea, and Torn rotator cuff (it:2012,rt:2013). here with :  1.  Obstructive sleep apnea on CPAP 2.  Narcolepsy  The patient has not been using her CPAP due to a NG tube.  I have encouraged patient to restart the CPAP since NG tube has been removed.  Adderall will be refilled today.  She is advised  that if her symptoms worsen or she develops new symptoms she should let us know.  She will follow-up in 6 months or sooner if needed.    Ward Givens, MSN, NP-C 03/15/2018, 2:18 PM [facility] [address] [place], [place] [address] [phone]

## 2018-04-07 NOTE — Progress Notes (Signed)
I agree with the assessment and plan as directed by NP .The patient is known to me .   Denica Web, MD  

## 2018-04-10 DIAGNOSIS — Z01411 Encounter for gynecological examination (general) (routine) with abnormal findings: Secondary | ICD-10-CM | POA: Diagnosis not present

## 2018-04-10 DIAGNOSIS — N761 Subacute and chronic vaginitis: Secondary | ICD-10-CM | POA: Diagnosis not present

## 2018-04-10 DIAGNOSIS — R21 Rash and other nonspecific skin eruption: Secondary | ICD-10-CM | POA: Diagnosis not present

## 2018-04-10 DIAGNOSIS — N951 Menopausal and female climacteric states: Secondary | ICD-10-CM | POA: Diagnosis not present

## 2018-04-14 ENCOUNTER — Other Ambulatory Visit: Payer: Self-pay | Admitting: Obstetrics and Gynecology

## 2018-04-14 DIAGNOSIS — Z1231 Encounter for screening mammogram for malignant neoplasm of breast: Secondary | ICD-10-CM

## 2018-04-24 DIAGNOSIS — Z79891 Long term (current) use of opiate analgesic: Secondary | ICD-10-CM | POA: Diagnosis not present

## 2018-04-24 DIAGNOSIS — M545 Low back pain: Secondary | ICD-10-CM | POA: Diagnosis not present

## 2018-04-24 DIAGNOSIS — M5136 Other intervertebral disc degeneration, lumbar region: Secondary | ICD-10-CM | POA: Diagnosis not present

## 2018-04-25 DIAGNOSIS — D3122 Benign neoplasm of left retina: Secondary | ICD-10-CM | POA: Diagnosis not present

## 2018-05-16 ENCOUNTER — Ambulatory Visit
Admission: RE | Admit: 2018-05-16 | Discharge: 2018-05-16 | Disposition: A | Discharge status: Home / Self Care | Payer: Federal, State, Local not specified - PPO | Source: Ambulatory Visit | Attending: Obstetrics and Gynecology | Admitting: Obstetrics and Gynecology

## 2018-05-16 DIAGNOSIS — Z1231 Encounter for screening mammogram for malignant neoplasm of breast: Secondary | ICD-10-CM | POA: Diagnosis not present

## 2018-05-26 ENCOUNTER — Telehealth: Payer: Self-pay | Admitting: Adult Health

## 2018-05-26 NOTE — Telephone Encounter (Signed)
I LMVM for pt that from drug registry she last filled prescription on 12-20-17. There  Should be prescription from 03-15-18.  I called [facility] and spoke to [facility] and they did have this prescription and will get ready for pt.

## 2018-05-26 NOTE — Telephone Encounter (Signed)
Pt request refill for amphetamine-dextroamphetamine (ADDERALL) 10 MG tablet sent to [id number] - [place], [address] AT [address]. Pt is aware the clinic closes at noon today

## 2018-05-31 DIAGNOSIS — K219 Gastro-esophageal reflux disease without esophagitis: Secondary | ICD-10-CM | POA: Diagnosis not present

## 2018-06-16 DIAGNOSIS — N898 Other specified noninflammatory disorders of vagina: Secondary | ICD-10-CM | POA: Diagnosis not present

## 2018-06-16 DIAGNOSIS — Z23 Encounter for immunization: Secondary | ICD-10-CM | POA: Diagnosis not present

## 2018-06-21 DIAGNOSIS — K219 Gastro-esophageal reflux disease without esophagitis: Secondary | ICD-10-CM | POA: Diagnosis not present

## 2018-06-21 DIAGNOSIS — R110 Nausea: Secondary | ICD-10-CM | POA: Diagnosis not present

## 2018-06-23 DIAGNOSIS — K219 Gastro-esophageal reflux disease without esophagitis: Secondary | ICD-10-CM | POA: Diagnosis not present

## 2018-06-29 ENCOUNTER — Other Ambulatory Visit: Payer: Self-pay | Admitting: Adult Health

## 2018-06-29 DIAGNOSIS — G47419 Narcolepsy without cataplexy: Secondary | ICD-10-CM

## 2018-06-29 DIAGNOSIS — G4733 Obstructive sleep apnea (adult) (pediatric): Secondary | ICD-10-CM

## 2018-06-29 DIAGNOSIS — Z9989 Dependence on other enabling machines and devices: Secondary | ICD-10-CM

## 2018-06-29 MED ORDER — AMPHETAMINE-DEXTROAMPHETAMINE 10 MG PO TABS: ORAL_TABLET | ORAL | 0 refills | Status: DC

## 2018-06-29 NOTE — Telephone Encounter (Signed)
Patient called in wanting a refill on her Adderall.

## 2018-06-29 NOTE — Addendum Note (Signed)
Addended by: Brandon Melnick on: 06/29/2018 10:46 AM   Modules accepted: Orders

## 2018-06-29 NOTE — Telephone Encounter (Signed)
Drug registry last fill 05/26/18 for #[id number].

## 2018-07-25 DIAGNOSIS — M6289 Other specified disorders of muscle: Secondary | ICD-10-CM | POA: Diagnosis not present

## 2018-07-25 DIAGNOSIS — K5909 Other constipation: Secondary | ICD-10-CM | POA: Diagnosis not present

## 2018-07-27 DIAGNOSIS — N898 Other specified noninflammatory disorders of vagina: Secondary | ICD-10-CM | POA: Diagnosis not present

## 2018-08-21 ENCOUNTER — Other Ambulatory Visit: Payer: Self-pay | Admitting: Orthopedic Surgery

## 2018-08-21 DIAGNOSIS — M5412 Radiculopathy, cervical region: Secondary | ICD-10-CM

## 2018-08-31 ENCOUNTER — Ambulatory Visit
Admission: RE | Admit: 2018-08-31 | Discharge: 2018-08-31 | Disposition: A | Discharge status: Home / Self Care | Payer: Federal, State, Local not specified - PPO | Source: Ambulatory Visit | Attending: Orthopedic Surgery | Admitting: Orthopedic Surgery

## 2018-08-31 DIAGNOSIS — M5412 Radiculopathy, cervical region: Secondary | ICD-10-CM

## 2018-08-31 MED ORDER — ONDANSETRON HCL 4 MG/2ML IJ SOLN: 4.0000 mg | Freq: Four times a day (QID) | INTRAMUSCULAR | Status: DC | PRN

## 2018-08-31 MED ORDER — DIAZEPAM 5 MG PO TABS
10.0000 mg | ORAL_TABLET | Freq: Once | ORAL | Status: DC
Start: 2018-08-31 — End: 2018-09-01

## 2018-08-31 MED ORDER — IOPAMIDOL (ISOVUE-M 300) INJECTION 61%
10.0000 mL | Freq: Once | INTRAMUSCULAR | Status: AC | PRN
  Administered 2018-08-31: 10 mL via INTRATHECAL

## 2018-08-31 NOTE — Discharge Instructions (Signed)
Myelogram Discharge Instructions  1. Go home and rest quietly for the next 24 hours.  It is important to lie flat for the next 24 hours.  Get up only to go to the restroom.  You may lie in the bed or on a couch on your back, your stomach, your left side or your right side.  You may have one pillow under your head.  You may have pillows between your knees while you are on your side or under your knees while you are on your back.  2. DO NOT drive today.  Recline the seat as far back as it will go, while still wearing your seat belt, on the way home.  3. You may get up to go to the bathroom as needed.  You may sit up for 10 minutes to eat.  You may resume your normal diet and medications unless otherwise indicated.  Drink lots of extra fluids today and tomorrow.  4. The incidence of headache, nausea, or vomiting is about 5% (one in 20 patients).  If you develop a headache, lie flat and drink plenty of fluids until the headache goes away.  Caffeinated beverages may be helpful.  If you develop severe nausea and vomiting or a headache that does not go away with flat bed rest, call [phone].  5. You may resume normal activities after your 24 hours of bed rest is over; however, do not exert yourself strongly or do any heavy lifting tomorrow. If when you get up you have a headache when standing, go back to bed and force fluids for another 24 hours.  6. Call your physician for a follow-up appointment.  The results of your myelogram will be sent directly to your physician by the following day.  7. If you have any questions or if complications develop after you arrive home, please call [phone].  Discharge instructions have been explained to the patient.  The patient, or the person responsible for the patient, fully understands these instructions.  YOU MAY RESTART YOUR ADDERALL, RELPAX AND PHENERGAN TOMORROW 12/27 AT 01:00 PM.

## 2018-09-14 DIAGNOSIS — G47411 Narcolepsy with cataplexy: Secondary | ICD-10-CM | POA: Diagnosis not present

## 2018-09-14 DIAGNOSIS — G43719 Chronic migraine without aura, intractable, without status migrainosus: Secondary | ICD-10-CM | POA: Diagnosis not present

## 2018-09-14 DIAGNOSIS — G2581 Restless legs syndrome: Secondary | ICD-10-CM | POA: Diagnosis not present

## 2018-09-20 DIAGNOSIS — B009 Herpesviral infection, unspecified: Secondary | ICD-10-CM | POA: Diagnosis not present

## 2018-09-20 DIAGNOSIS — N390 Urinary tract infection, site not specified: Secondary | ICD-10-CM | POA: Diagnosis not present

## 2018-09-20 DIAGNOSIS — N760 Acute vaginitis: Secondary | ICD-10-CM | POA: Diagnosis not present

## 2018-09-20 DIAGNOSIS — B9689 Other specified bacterial agents as the cause of diseases classified elsewhere: Secondary | ICD-10-CM | POA: Diagnosis not present

## 2018-09-26 DIAGNOSIS — R102 Pelvic and perineal pain: Secondary | ICD-10-CM | POA: Diagnosis not present

## 2018-10-02 DIAGNOSIS — N760 Acute vaginitis: Secondary | ICD-10-CM | POA: Diagnosis not present

## 2018-10-02 DIAGNOSIS — B009 Herpesviral infection, unspecified: Secondary | ICD-10-CM | POA: Diagnosis not present

## 2018-10-02 DIAGNOSIS — N898 Other specified noninflammatory disorders of vagina: Secondary | ICD-10-CM | POA: Diagnosis not present

## 2018-10-11 ENCOUNTER — Ambulatory Visit: Payer: Federal, State, Local not specified - PPO | Admitting: Adult Health

## 2018-10-19 ENCOUNTER — Encounter: Payer: Self-pay | Admitting: Family Medicine

## 2018-10-19 ENCOUNTER — Ambulatory Visit: Payer: Federal, State, Local not specified - PPO | Admitting: Family Medicine

## 2018-10-19 VITALS — BP 118/80 | HR 84 | Ht 62.0 in | Wt 159.5 lb

## 2018-10-19 DIAGNOSIS — Z9989 Dependence on other enabling machines and devices: Secondary | ICD-10-CM

## 2018-10-19 DIAGNOSIS — G4730 Sleep apnea, unspecified: Secondary | ICD-10-CM | POA: Diagnosis not present

## 2018-10-19 DIAGNOSIS — G4733 Obstructive sleep apnea (adult) (pediatric): Secondary | ICD-10-CM

## 2018-10-19 NOTE — Progress Notes (Addendum)
PATIENT: [name] DOB: 1971-03-13  REASON FOR VISIT: follow up HISTORY FROM: patient  Chief Complaint  Patient presents with  . Follow-up    CPAP follow up. Alone. New room. Patient mentione that her fabric mask is hard for her use/breathe so she hasn't been able use it.      HISTORY OF PRESENT ILLNESS: Today 10/19/18 [name] is a 48 y.o. female here today for follow up.  This is [name] admits that she has not been using her CPAP in the last 3 months.  She was allergic to the latex mask and had requested a fabric mask.  She reports that since getting the fabric mask she does not feel that she has a good fit.  She feels that the air may be too strong.  She is uncertain as the mask is not fitting as well as her previous mask in.  She is requesting for [place] to order a mask refitting today.  HISTORY: (copied from [place] note on 03/15/2018) Ms. Korte is a 48 year old female with a history of obstructive sleep apnea on CPAP and narcolepsy.  She returns today for follow-up.  The patient reports that she had a NG tube placed in January due to digestive issues.  She recently got this removed on July 5th.  She states that she had a tube she was not using the CPAP machine.  She states that she has continue using Adderall for narcolepsy.  She states that if she is having a good day she would forego using the medicine as she can.  She returns today for evaluation.  REVIEW OF SYSTEMS: Out of a complete 14 system review of symptoms, the patient complains only of the following symptoms, none reported and all other reviewed systems are negative.  Epworth Sleepiness Scale: 11  FSS: 53   ALLERGIES: Allergies  Allergen Reactions  . Tramadol   . Naproxen Sodium   . Adhesive [Tape] Rash    Glue from steri strips  . Aspirin Nausea Only  . Benadryl [Diphenhydramine Hcl] Other (See Comments)    Makes her hyper, jittery  . Codeine Nausea And Vomiting  . Dust Mite Extract Other (See  Comments)    Cough, sneeze, eyes runny  . Metoclopramide     nervous  . Mold Extract [Trichophyton] Other (See Comments)    Cough, sneeze, eyes/nose runny  . Molds & Smuts     Cough, sneeze, eyes/nose runny  . Pollen Extract Other (See Comments)    Cough, sneeze, eyes/nose runny  . Prednisone Other (See Comments)    Makes her hyper, jittery   . Toradol [Ketorolac Tromethamine] Anxiety  . Zofran [Ondansetron] Nausea And Vomiting    HOME MEDICATIONS: Outpatient Medications Prior to Visit  Medication Sig Dispense Refill  . albuterol (PROVENTIL HFA;VENTOLIN HFA) 108 (90 BASE) MCG/ACT inhaler Inhale 2 puffs into the lungs every 6 (six) hours as needed for wheezing or shortness of breath.    . amphetamine-dextroamphetamine (ADDERALL) 10 MG tablet 2 tablets PO in AM and one tab at lunch. 90 tablet 0  . budesonide-formoterol (SYMBICORT) 160-4.5 MCG/ACT inhaler Inhale 2 puffs into the lungs 2 (two) times daily.      . Clobetasol Propionate (CLOBEX) 0.05 % shampoo Apply 1 application topically every Wednesday.     . cycloSPORINE (RESTASIS) 0.05 % ophthalmic emulsion Place 1 drop into both eyes every 12 (twelve) hours.      . diclofenac (VOLTAREN) 50 MG EC tablet Take 1 tablet (  50 mg total) by mouth 2 (two) times daily. 60 tablet 0  . dicyclomine (BENTYL) 10 MG capsule Take 10 mg by mouth 4 (four) times daily as needed for spasms.    Marland Kitchen dronabinol (MARINOL) 5 MG capsule TK 1 C PO BID  3  . esomeprazole (NEXIUM) 40 MG capsule Take 40 mg by mouth daily at 12 noon.    Marland Kitchen FLUOCINOLONE ACETONIDE SCALP 0.01 % OIL Apply 1 application topically daily.  0  . fluocinonide (LIDEX) 0.05 % cream Apply 1 application topically 3 (three) times a week.     . gabapentin (NEURONTIN) 300 MG capsule Take 1 capsule (300 mg total) by mouth 3 (three) times daily. 90 capsule 2  . hydrochlorothiazide (HYDRODIURIL) 25 MG tablet Take 25 mg by mouth every morning.     . hydrocortisone (PROCTOCORT) 1 % CREA Apply topically.     . lidocaine (XYLOCAINE) 2 % jelly 1 application by Other route as needed (rectally).    . LORazepam (ATIVAN) 0.5 MG tablet Take 0.5 mg by mouth every 6 (six) hours as needed for anxiety.    Marland Kitchen loteprednol (LOTEMAX) 0.2 % SUSP Place 1 drop into both eyes daily.    Marland Kitchen MYRBETRIQ 25 MG TB24 tablet TK 1 T PO QD  3  . nitroGLYCERIN (NITROGLYN) 2 % ointment Apply 1 inch topically at bedtime.    . Olopatadine HCl (PATADAY) 0.2 % SOLN Place 1 drop into both eyes 2 (two) times daily as needed (for allergies).     . OnabotulinumtoxinA (BOTOX IJ) Inject as directed every 3 (three) months.    . pramipexole (MIRAPEX) 1 MG tablet Take 1 mg by mouth at bedtime.  0  . PREMARIN 0.3 MG tablet Take 0.3 mg by mouth daily.    . promethazine (PHENERGAN) 25 MG tablet Take 50 mg by mouth every 6 (six) hours as needed for nausea or vomiting.   3  . PROMETHEGAN 25 MG suppository PLACE 1 SUPPOSITORY RECTALLY Q 6 H PRN N FOR UP TO 7 DAYS  11  . RECTIV 0.4 % OINT APPLY PEA-SIZED AMOUNT TO RECTAL AREA NIGHTLY FOR 14 DAYS FOR FISSURE TREATMENT.  0  . RELPAX 40 MG tablet Take 40 mg by mouth every 2 (two) hours as needed for migraine or headache.     . saccharomyces boulardii (FLORASTOR) 250 MG capsule Take 1 capsule (250 mg total) by mouth 2 (two) times daily. 60 capsule 1  . tiZANidine (ZANAFLEX) 4 MG tablet Take 1 tablet (4 mg total) by mouth every 8 (eight) hours as needed for muscle spasms. 90 tablet 2  . topiramate (TOPAMAX) 100 MG tablet Take 100-200 mg by mouth 2 (two) times daily. 1 tab every AM and 2 at night    . triamcinolone cream (KENALOG) 0.1 % Apply 1 application topically daily as needed (to affected area).     . verapamil (VERELAN PM) 240 MG 24 hr capsule Take 240 mg by mouth at bedtime.      . Vitamin D, Ergocalciferol, (DRISDOL) 50000 UNITS CAPS capsule Take 50,000 Units by mouth every 7 (seven) days.      No facility-administered medications prior to visit.     PAST MEDICAL HISTORY: Past Medical History:    Diagnosis Date  . Allergy   . Anxiety   . Asthma   . Bipolar 1 disorder ([place])   . Chronic lower back pain 02/07/2014  . Depression    excessive daytime sleepiness-MSLT confirmed pathological degree of hypersomnia-12/13  .  Esophageal reflux   . Gastroparesis   . Hypersomnia   . Hypertension   . Medication monitoring encounter 02/07/2014  . Migraine   . Migraine   . Migraines   . Narcolepsy   . OCD (obsessive compulsive disorder)   . Opiate use 07/02/2015  . Osteoarthritis    knees, hands, shoulder, lower back  . Other iron deficiency anemias 08/28/2015  . Overactive bladder   . Psychosis ([place])   . Restless leg syndrome   . S/P lumbar fusion 2011  . Schizophrenia ([place])   . Schizophrenia, schizo-affective ([place])   . Seasonal allergies   . Sleep apnea    uses CPAP every night  . Torn rotator cuff it:2012,rt:2013    PAST SURGICAL HISTORY: Past Surgical History:  Procedure Laterality Date  . ABDOMINAL HYSTERECTOMY  2001  . ANKLE FUSION     right  . CHOLECYSTECTOMY N/A 07/20/2013   Procedure: LAPAROSCOPIC CHOLECYSTECTOMY;  Surgeon: Harl Bowie, MD;  Location: [facility];  Service: General;  Laterality: N/A;  . COLONOSCOPY WITH PROPOFOL N/A 10/02/2013   Procedure: COLONOSCOPY WITH PROPOFOL;  Surgeon: Garlan Fair, MD;  Location: [facility];  Service: Endoscopy;  Laterality: N/A;  . ESOPHAGOGASTRODUODENOSCOPY (EGD) WITH PROPOFOL N/A 10/02/2013   Procedure: ESOPHAGOGASTRODUODENOSCOPY (EGD) WITH PROPOFOL;  Surgeon: Garlan Fair, MD;  Location: [facility];  Service: Endoscopy;  Laterality: N/A;  . FLEXIBLE SIGMOIDOSCOPY Left 04/07/2015   Procedure: FLEXIBLE SIGMOIDOSCOPY;  Surgeon: Ronald Lobo, MD;  Location: [facility];  Service: Endoscopy;  Laterality: Left;  . FRACTURE SURGERY    . LAPAROSCOPIC SALPINGO OOPHERECTOMY Left 12/05/2013   Procedure: LAPAROSCOPIC SALPINGO OOPHORECTOMY;  Surgeon: Thurnell Lose, MD;  Location: [facility];  Service: Gynecology;  Laterality:  Left;  . LUMBAR FUSION  2011   L4-L5  . OOPHORECTOMY   2004   right  . ROTATOR CUFF REPAIR  2012,2013   torn   . SHOULDER SURGERY     left and right , bone spurs, torn muscle  . SPINE SURGERY    . spurs,ruptured disc bone  2009  . TUBAL LIGATION  2004  . VESICOVAGINAL FISTULA CLOSURE W/ TAH  2001  . WISDOM TOOTH EXTRACTION      FAMILY HISTORY: Family History  Problem Relation Age of Onset  . Cancer Mother        breast  . Breast cancer Mother   . Cancer Father        prostate,colon  . Hypertension Father   . Thyroid disease Father   . High Cholesterol Father   . Thyroid disease Sister   . Cancer Paternal Aunt        colon  . Cancer Maternal Grandmother        kidney  . Cancer Paternal Grandmother        colon  . Cancer Paternal Aunt        lung  . Breast cancer Maternal Aunt   . Breast cancer Cousin   . Narcolepsy Neg Hx     SOCIAL HISTORY: Social History   Socioeconomic History  . Marital status: Married    Spouse name: Elberta Fortis  . Number of children: 3  . Years of education: Master's  . Highest education level: Not on file  Occupational History  . Occupation: unemployed    Fish farm manager: UNEMPLOYED  Social Needs  . Financial resource strain: Not on file  . Food insecurity:    Worry: Not on file    Inability: Not on file  . Transportation  needs:    Medical: Not on file    Non-medical: Not on file  Tobacco Use  . Smoking status: Never Smoker  . Smokeless tobacco: Never Used  Substance and Sexual Activity  . Alcohol use: No  . Drug use: No  . Sexual activity: Yes    Birth control/protection: Surgical  Lifestyle  . Physical activity:    Days per week: Not on file    Minutes per session: Not on file  . Stress: Not on file  Relationships  . Social connections:    Talks on phone: Not on file    Gets together: Not on file    Attends religious service: Not on file    Active member of club or organization: Not on file    Attends meetings of clubs or  organizations: Not on file    Relationship status: Not on file  . Intimate partner violence:    Fear of current or ex partner: Not on file    Emotionally abused: Not on file    Physically abused: Not on file    Forced sexual activity: Not on file  Other Topics Concern  . Not on file  Social History Narrative    This patient has an RDI of 12 and AHI of 4.8,  titrated to 6 cm water after PSG, she did qualify for MSLT, Epworth 22 today on 6 cm water.      Based on her MSLT  of 3.6 minutes without SREM and Epworth of 22 on CPAP with residual AHI of 1.8, she should be treated  in a trial base for narcolepsy. She is on many REM supressant medications, and may not be able to  express the narcolepsy in an MSLT.   Patient is married Elberta Fortis) and lives at home with her husband and three children.   Patient is currently unemployed.   Patient has a Master's degree.   Patient is right-handed.   Patient drinks two cups of coffee on average but none lately for 6 months, 3 cups of tea daily.      PHYSICAL EXAM  Vitals:   10/19/18 1440  BP: 118/80  Pulse: 84  Weight: 159 lb 8 oz (72.3 kg)  Height: 5\' 2"  (1.575 m)   Body mass index is 29.17 kg/m.  Generalized: Well developed, in no acute distress  Cardiology: normal rate and rhythm, no murmur noted Respiratory: Lungs are clear to auscultation bilaterally Mallampati 3+, neck circumference is 14.5 inches Neurological examination  Mentation: Alert oriented to time, place, history taking. Follows all commands speech and language fluent Cranial nerve II-XII: Pupils were equal round reactive to light. Extraocular movements were full, visual field were full on confrontational test. Facial sensation and strength were normal. Uvula tongue midline. Head turning and shoulder shrug  were normal and symmetric. Motor: The motor testing reveals 5 over 5 strength of all 4 extremities. Good symmetric motor tone is noted throughout.  Sensory: Sensory testing  is intact to soft touch on all 4 extremities. No evidence of extinction is noted.  Coordination: Cerebellar testing reveals good finger-nose-finger bilaterally.  Gait and station: Gait is normal.   DIAGNOSTIC DATA (LABS, IMAGING, TESTING) - I reviewed patient records, labs, notes, testing and imaging myself where available.  No flowsheet data found.   Lab Results  Component Value Date   WBC 13.1 (H) 11/14/2016   HGB 13.5 11/14/2016   HCT 39.4 11/14/2016   MCV 78.0 11/14/2016   PLT 286 11/14/2016  Component Value Date/Time   NA 137 11/14/2016 1542   NA 143 08/28/2015 1557   K 4.7 11/14/2016 1542   CL 99 (L) 11/14/2016 1542   CO2 28 11/14/2016 1542   GLUCOSE 127 (H) 11/14/2016 1542   BUN 8 11/14/2016 1542   BUN 7 08/28/2015 1557   CREATININE 0.79 11/14/2016 1542   CALCIUM 10.0 11/14/2016 1542   PROT 9.2 (H) 11/14/2016 1542   PROT 7.1 08/28/2015 1557   ALBUMIN 4.7 11/14/2016 1542   ALBUMIN 4.3 08/28/2015 1557   AST 21 11/14/2016 1542   ALT 19 11/14/2016 1542   ALKPHOS 61 11/14/2016 1542   BILITOT 0.7 11/14/2016 1542   BILITOT 0.2 08/28/2015 1557   GFRNONAA >60 11/14/2016 1542   GFRAA >60 11/14/2016 1542   No results found for: CHOL, HDL, LDLCALC, LDLDIRECT, TRIG, CHOLHDL No results found for: HGBA1C Lab Results  Component Value Date   VITAMINB12 918 (H) 12/15/2015   No results found for: TSH     ASSESSMENT AND PLAN 48 y.o. year old female  has a past medical history of Allergy, Anxiety, Asthma, Bipolar 1 disorder ([place]), Chronic lower back pain (02/07/2014), Depression, Esophageal reflux, Gastroparesis, Hypersomnia, Hypertension, Medication monitoring encounter (02/07/2014), Migraine, Migraine, Migraines, Narcolepsy, OCD (obsessive compulsive disorder), Opiate use (07/02/2015), Osteoarthritis, Other iron deficiency anemias (08/28/2015), Overactive bladder, Psychosis ([place]), Restless leg syndrome, S/P lumbar fusion (2011), Schizophrenia ([place]), Schizophrenia,  schizo-affective ([place]), Seasonal allergies, Sleep apnea, and Torn rotator cuff (it:2012,rt:2013). here with     ICD-10-CM   1. OSA on CPAP G47.33 For home use only DME continuous positive airway pressure (CPAP)   Z99.89   2. Sleep apnea, unspecified type G47.30     We will send her for refitting. She was encouraged to call us after mask refitting should any other concerns arise.  She was encouraged to use CPAP nightly and greater then 4 hours each night.  We will follow-up with her in 3 months.   Orders Placed This Encounter  Procedures  . For home use only DME continuous positive airway pressure (CPAP)    She needs mask refitting    Order Specific Question:   Patient has OSA or probable OSA    Answer:   Yes    Order Specific Question:   Settings    Answer:   Other see comments    Order Specific Question:   CPAP supplies needed    Answer:   Mask, headgear, cushions, filters, heated tubing and water chamber     No orders of the defined types were placed in this encounter.     I spent 15 minutes with the patient. 50% of this time was spent counseling and educating patient on plan of care and medications.    Debbora Presto, FNP-C 10/19/2018, 2:57 PM [facility] [address] [place], [place] [address] [phone]

## 2018-10-19 NOTE — Patient Instructions (Addendum)
We will order mask refitting Use CPAP nightly and for greater than 4 hours each night.   Sleep Apnea Sleep apnea affects breathing during sleep. It causes breathing to stop for a short time or to become shallow. It can also increase the risk of:  Heart attack.  Stroke.  Being very overweight (obese).  Diabetes.  Heart failure.  Irregular heartbeat. The goal of treatment is to help you breathe normally again. What are the causes? There are three kinds of sleep apnea:  Obstructive sleep apnea. This is caused by a blocked or collapsed airway.  Central sleep apnea. This happens when the brain does not send the right signals to the muscles that control breathing.  Mixed sleep apnea. This is a combination of obstructive and central sleep apnea. The most common cause of this condition is a collapsed or blocked airway. This can happen if:  Your throat muscles are too relaxed.  Your tongue and tonsils are too large.  You are overweight.  Your airway is too small. What increases the risk?  Being overweight.  Smoking.  Having a small airway.  Being older.  Being female.  Drinking alcohol.  Taking medicines to calm yourself (sedatives or tranquilizers).  Having family members with the condition. What are the signs or symptoms?  Trouble staying asleep.  Being sleepy or tired during the day.  Getting angry a lot.  Loud snoring.  Headaches in the morning.  Not being able to focus your mind (concentrate).  Forgetting things.  Less interest in sex.  Mood swings.  Personality changes.  Feelings of sadness (depression).  Waking up a lot during the night to pee (urinate).  Dry mouth.  Sore throat. How is this diagnosed?  Your medical history.  A physical exam.  A test that is done when you are sleeping (sleep study). The test is most often done in a sleep lab but may also be done at home. How is this treated?   Sleeping on your side.  Using a  medicine to get rid of mucus in your nose (decongestant).  Avoiding the use of alcohol, medicines to help you relax, or certain pain medicines (narcotics).  Losing weight, if needed.  Changing your diet.  Not smoking.  Using a machine to open your airway while you sleep, such as: ? An oral appliance. This is a mouthpiece that shifts your lower jaw forward. ? A CPAP device. This device blows air through a mask when you breathe out (exhale). ? An EPAP device. This has valves that you put in each nostril. ? A BPAP device. This device blows air through a mask when you breathe in (inhale) and breathe out.  Having surgery if other treatments do not work. It is important to get treatment for sleep apnea. Without treatment, it can lead to:  High blood pressure.  Coronary artery disease.  In men, not being able to have an erection (impotence).  Reduced thinking ability. Follow these instructions at home: Lifestyle  Make changes that your doctor recommends.  Eat a healthy diet.  Lose weight if needed.  Avoid alcohol, medicines to help you relax, and some pain medicines.  Do not use any products that contain nicotine or tobacco, such as cigarettes, e-cigarettes, and chewing tobacco. If you need help quitting, ask your doctor. General instructions  Take over-the-counter and prescription medicines only as told by your doctor.  If you were given a machine to use while you sleep, use it only as told by your  doctor.  If you are having surgery, make sure to tell your doctor you have sleep apnea. You may need to bring your device with you.  Keep all follow-up visits as told by your doctor. This is important. Contact a doctor if:  The machine that you were given to use during sleep bothers you or does not seem to be working.  You do not get better.  You get worse. Get help right away if:  Your chest hurts.  You have trouble breathing in enough air.  You have an uncomfortable  feeling in your back, arms, or stomach.  You have trouble talking.  One side of your body feels weak.  A part of your face is hanging down. These symptoms may be an emergency. Do not wait to see if the symptoms will go away. Get medical help right away. Call your local emergency services (911 in the [place] Do not drive yourself to the hospital. Summary  This condition affects breathing during sleep.  The most common cause is a collapsed or blocked airway.  The goal of treatment is to help you breathe normally while you sleep. This information is not intended to replace advice given to you by your health care provider. Make sure you discuss any questions you have with your health care provider. Document Released: 06/01/2008 Document Revised: 04/18/2018 Document Reviewed: 04/18/2018 Elsevier Interactive Patient Education  [employer].

## 2018-10-23 DIAGNOSIS — B009 Herpesviral infection, unspecified: Secondary | ICD-10-CM | POA: Diagnosis not present

## 2018-10-23 DIAGNOSIS — N760 Acute vaginitis: Secondary | ICD-10-CM | POA: Diagnosis not present

## 2018-10-24 DIAGNOSIS — M6289 Other specified disorders of muscle: Secondary | ICD-10-CM | POA: Diagnosis not present

## 2018-10-24 DIAGNOSIS — K5909 Other constipation: Secondary | ICD-10-CM | POA: Diagnosis not present

## 2018-11-09 DIAGNOSIS — G47411 Narcolepsy with cataplexy: Secondary | ICD-10-CM | POA: Diagnosis not present

## 2018-11-09 DIAGNOSIS — G2581 Restless legs syndrome: Secondary | ICD-10-CM | POA: Diagnosis not present

## 2018-11-09 DIAGNOSIS — G43719 Chronic migraine without aura, intractable, without status migrainosus: Secondary | ICD-10-CM | POA: Diagnosis not present

## 2018-11-10 DIAGNOSIS — F4320 Adjustment disorder, unspecified: Secondary | ICD-10-CM | POA: Diagnosis not present

## 2018-11-30 DIAGNOSIS — R3989 Other symptoms and signs involving the genitourinary system: Secondary | ICD-10-CM | POA: Diagnosis not present

## 2018-11-30 DIAGNOSIS — N898 Other specified noninflammatory disorders of vagina: Secondary | ICD-10-CM | POA: Diagnosis not present

## 2018-11-30 DIAGNOSIS — B009 Herpesviral infection, unspecified: Secondary | ICD-10-CM | POA: Diagnosis not present

## 2018-12-08 DIAGNOSIS — F4320 Adjustment disorder, unspecified: Secondary | ICD-10-CM | POA: Diagnosis not present

## 2018-12-15 DIAGNOSIS — G43719 Chronic migraine without aura, intractable, without status migrainosus: Secondary | ICD-10-CM | POA: Diagnosis not present

## 2018-12-15 DIAGNOSIS — F4320 Adjustment disorder, unspecified: Secondary | ICD-10-CM | POA: Diagnosis not present

## 2018-12-22 DIAGNOSIS — F4320 Adjustment disorder, unspecified: Secondary | ICD-10-CM | POA: Diagnosis not present

## 2018-12-29 DIAGNOSIS — F4323 Adjustment disorder with mixed anxiety and depressed mood: Secondary | ICD-10-CM | POA: Diagnosis not present

## 2019-01-12 DIAGNOSIS — F4323 Adjustment disorder with mixed anxiety and depressed mood: Secondary | ICD-10-CM | POA: Diagnosis not present

## 2019-01-17 ENCOUNTER — Telehealth: Payer: Self-pay | Admitting: *Deleted

## 2019-01-17 ENCOUNTER — Ambulatory Visit: Payer: Federal, State, Local not specified - PPO | Admitting: Family Medicine

## 2019-01-17 NOTE — Telephone Encounter (Signed)
01-17-2019 Pt has called to r/s her appointment Pt gave verbal consent to file insurance for a [email or web address] VV Pt email:[email or web address]  Pt understands that although there may be some limitations with this type of visit, we will take all precautions to reduce any security or privacy concerns.  Pt understands that this will be treated like an in office visit and we will file with pt's insurance, and there may be a patient responsible charge related to this service.

## 2019-01-17 NOTE — Telephone Encounter (Signed)
LMVM for pt that was cancelling appt for today scheduled at 1300. This was 2nd VM.  Please call back to reschedule.

## 2019-01-17 NOTE — Telephone Encounter (Signed)
email sent to [email or web address].

## 2019-01-21 DIAGNOSIS — Z9989 Dependence on other enabling machines and devices: Secondary | ICD-10-CM | POA: Insufficient documentation

## 2019-01-21 DIAGNOSIS — G4733 Obstructive sleep apnea (adult) (pediatric): Secondary | ICD-10-CM | POA: Insufficient documentation

## 2019-01-21 NOTE — Progress Notes (Signed)
PATIENT: [name] DOB: 1971/04/11  REASON FOR VISIT: follow up HISTORY FROM: patient  Virtual Visit via Telephone Note  I connected with [name] on 01/22/19 at  7:30 AM EDT by telephone and verified that I am speaking with the correct person using two identifiers.   I discussed the limitations, risks, security and privacy concerns of performing an evaluation and management service by telephone and the availability of in person appointments. I also discussed with the patient that there may be a patient responsible charge related to this service. The patient expressed understanding and agreed to proceed.   History of Present Illness:  01/22/19 [name] is a 48 y.o. female for follow up. She was refitted for a fabric mask in February. She has not used her CPAP since refitting. She states that the pressure settings are too strong and she can not tolerate it. She reports a couple of nights where she attempted using CPAP. No data is available for review of the past 30 days. She does complain of daytime sleepiness. She previously took Adderall but has not been on this medication in several months. Last refill 06/29/2018. She is asking to restart Adderall.    HISTORY: Today 01/21/19 [name] is a 48 y.o. female here today for follow up.  [name] admits that she has not been using her CPAP in the last 3 months.  She was allergic to the latex mask and had requested a fabric mask.  She reports that since getting the fabric mask she does not feel that she has a good fit.  She feels that the air may be too strong.  She is uncertain as the mask is not fitting as well as her previous mask in.  She is requesting for [place] to order a mask refitting today.  HISTORY: (copied from [place] note on 03/15/2018) [name] is a 48 year old female with a history of obstructive sleep apnea on CPAP and narcolepsy.  She returns today for follow-up.  The patient reports that she had a  NG tube placed in January due to digestive issues.  She recently got this removed on July 5th.  She states that she had a tube she was not using the CPAP machine.  She states that she has continue using Adderall for narcolepsy.  She states that if she is having a good day she would forego using the medicine as she can.  She returns today for evaluation.    Observations/Objective:  Generalized: Well developed, in no acute distress  Mentation: Alert oriented to time, place, history taking. Follows all commands speech and language fluent   Assessment and Plan:  48 y.o. year old female  has a past medical history of Allergy, Anxiety, Asthma, Bipolar 1 disorder ([place]), Chronic lower back pain (02/07/2014), Depression, Esophageal reflux, Gastroparesis, Hypersomnia, Hypertension, Medication monitoring encounter (02/07/2014), Migraine, Migraine, Migraines, Narcolepsy, OCD (obsessive compulsive disorder), Opiate use (07/02/2015), Osteoarthritis, Other iron deficiency anemias (08/28/2015), Overactive bladder, Psychosis ([place]), Restless leg syndrome, S/P lumbar fusion (2011), Schizophrenia ([place]), Schizophrenia, schizo-affective ([place]), Seasonal allergies, Sleep apnea, and Torn rotator cuff (it:2012,rt:2013). with    ICD-10-CM   1. OSA on CPAP G47.33 For home use only DME continuous positive airway pressure (CPAP)   Z99.89   2. Daytime sleepiness R40.0    Unfortunately, [name] continues to be non compliant with CPAP therapy. Review of her chart shows persistent non compliance for nearly 2 years. She continues to suffer from daytime sleepiness as  well. I am hesitant to continue Adderall until OSA is better managed. I have discussed concerns of continue Adderall until OSA is managed. We have also discussed risks of untreated sleep apnea. I will send order for new pressure setting of auto titrating 5-15cmH20. We will follow up in 1 month to assess compliance. At that time, we will consider Adderall renewal if  warranted. She verbalizes agreement and understanding of plan.    Orders Placed This Encounter  Procedures  . For home use only DME continuous positive airway pressure (CPAP)    Please adjust pressures to auto titration of 5cmH20 -15cmH20.    Order Specific Question:   Patient has OSA or probable OSA    Answer:   Yes    Order Specific Question:   Is the patient currently using CPAP in the home    Answer:   Yes    Order Specific Question:   Settings    Answer:   Autotitration    Order Specific Question:   CPAP supplies needed    Answer:   Mask, headgear, cushions, filters, heated tubing and water chamber    No orders of the defined types were placed in this encounter.    Follow Up Instructions:  I discussed the assessment and treatment plan with the patient. The patient was provided an opportunity to ask questions and all were answered. The patient agreed with the plan and demonstrated an understanding of the instructions.   The patient was advised to call back or seek an in-person evaluation if the symptoms worsen or if the condition fails to improve as anticipated.  I provided 20 minutes of non-face-to-face time during this encounter. Patient was located at her place of residence during video conference.  Provider is located at her place of residence.  Liane Comber, RN helped to facilitate visit.   Debbora Presto, NP

## 2019-01-22 ENCOUNTER — Other Ambulatory Visit: Payer: Self-pay

## 2019-01-22 ENCOUNTER — Encounter: Payer: Self-pay | Admitting: Family Medicine

## 2019-01-22 ENCOUNTER — Ambulatory Visit (INDEPENDENT_AMBULATORY_CARE_PROVIDER_SITE_OTHER): Payer: Federal, State, Local not specified - PPO | Admitting: Family Medicine

## 2019-01-22 DIAGNOSIS — G4733 Obstructive sleep apnea (adult) (pediatric): Secondary | ICD-10-CM | POA: Diagnosis not present

## 2019-01-22 DIAGNOSIS — R400 Somnolence: Secondary | ICD-10-CM | POA: Diagnosis not present

## 2019-01-22 DIAGNOSIS — Z9989 Dependence on other enabling machines and devices: Secondary | ICD-10-CM | POA: Diagnosis not present

## 2019-01-26 DIAGNOSIS — G47411 Narcolepsy with cataplexy: Secondary | ICD-10-CM | POA: Diagnosis not present

## 2019-01-26 DIAGNOSIS — F4323 Adjustment disorder with mixed anxiety and depressed mood: Secondary | ICD-10-CM | POA: Diagnosis not present

## 2019-01-26 DIAGNOSIS — G4733 Obstructive sleep apnea (adult) (pediatric): Secondary | ICD-10-CM | POA: Diagnosis not present

## 2019-01-26 DIAGNOSIS — G43719 Chronic migraine without aura, intractable, without status migrainosus: Secondary | ICD-10-CM | POA: Diagnosis not present

## 2019-01-26 DIAGNOSIS — G2581 Restless legs syndrome: Secondary | ICD-10-CM | POA: Diagnosis not present

## 2019-02-09 DIAGNOSIS — F4323 Adjustment disorder with mixed anxiety and depressed mood: Secondary | ICD-10-CM | POA: Diagnosis not present

## 2019-03-16 DIAGNOSIS — F4323 Adjustment disorder with mixed anxiety and depressed mood: Secondary | ICD-10-CM | POA: Diagnosis not present

## 2019-05-18 DIAGNOSIS — F4323 Adjustment disorder with mixed anxiety and depressed mood: Secondary | ICD-10-CM | POA: Diagnosis not present

## 2019-07-24 ENCOUNTER — Other Ambulatory Visit: Payer: Self-pay | Admitting: Obstetrics and Gynecology

## 2019-07-24 DIAGNOSIS — Z1231 Encounter for screening mammogram for malignant neoplasm of breast: Secondary | ICD-10-CM

## 2019-07-25 DIAGNOSIS — Z79899 Other long term (current) drug therapy: Secondary | ICD-10-CM | POA: Diagnosis not present

## 2019-07-25 DIAGNOSIS — G894 Chronic pain syndrome: Secondary | ICD-10-CM | POA: Diagnosis not present

## 2019-07-25 DIAGNOSIS — Z79891 Long term (current) use of opiate analgesic: Secondary | ICD-10-CM | POA: Diagnosis not present

## 2019-07-26 DIAGNOSIS — Z23 Encounter for immunization: Secondary | ICD-10-CM | POA: Diagnosis not present

## 2019-07-26 DIAGNOSIS — B009 Herpesviral infection, unspecified: Secondary | ICD-10-CM | POA: Diagnosis not present

## 2019-07-26 DIAGNOSIS — E8941 Symptomatic postprocedural ovarian failure: Secondary | ICD-10-CM | POA: Diagnosis not present

## 2019-07-26 DIAGNOSIS — Z01411 Encounter for gynecological examination (general) (routine) with abnormal findings: Secondary | ICD-10-CM | POA: Diagnosis not present

## 2019-07-31 DIAGNOSIS — R112 Nausea with vomiting, unspecified: Secondary | ICD-10-CM | POA: Diagnosis not present

## 2019-07-31 DIAGNOSIS — R1084 Generalized abdominal pain: Secondary | ICD-10-CM | POA: Diagnosis not present

## 2019-07-31 DIAGNOSIS — K219 Gastro-esophageal reflux disease without esophagitis: Secondary | ICD-10-CM | POA: Diagnosis not present

## 2019-07-31 DIAGNOSIS — R110 Nausea: Secondary | ICD-10-CM | POA: Diagnosis not present

## 2019-09-14 ENCOUNTER — Ambulatory Visit
Admission: RE | Admit: 2019-09-14 | Discharge: 2019-09-14 | Disposition: A | Discharge status: Home / Self Care | Payer: Federal, State, Local not specified - PPO | Source: Ambulatory Visit | Attending: Obstetrics and Gynecology | Admitting: Obstetrics and Gynecology

## 2019-09-14 ENCOUNTER — Other Ambulatory Visit: Payer: Self-pay

## 2019-09-14 DIAGNOSIS — Z1231 Encounter for screening mammogram for malignant neoplasm of breast: Secondary | ICD-10-CM

## 2019-11-21 DIAGNOSIS — Z79899 Other long term (current) drug therapy: Secondary | ICD-10-CM | POA: Diagnosis not present

## 2020-01-03 DIAGNOSIS — S8002XA Contusion of left knee, initial encounter: Secondary | ICD-10-CM | POA: Diagnosis not present

## 2020-01-03 DIAGNOSIS — S8001XA Contusion of right knee, initial encounter: Secondary | ICD-10-CM | POA: Diagnosis not present

## 2020-02-20 DIAGNOSIS — Z79891 Long term (current) use of opiate analgesic: Secondary | ICD-10-CM | POA: Diagnosis not present

## 2020-02-20 DIAGNOSIS — M9904 Segmental and somatic dysfunction of sacral region: Secondary | ICD-10-CM | POA: Diagnosis not present

## 2020-02-20 DIAGNOSIS — M5412 Radiculopathy, cervical region: Secondary | ICD-10-CM | POA: Diagnosis not present

## 2020-02-20 DIAGNOSIS — M5030 Other cervical disc degeneration, unspecified cervical region: Secondary | ICD-10-CM | POA: Diagnosis not present

## 2020-07-08 DIAGNOSIS — Z1211 Encounter for screening for malignant neoplasm of colon: Secondary | ICD-10-CM | POA: Diagnosis not present

## 2020-07-16 DIAGNOSIS — G894 Chronic pain syndrome: Secondary | ICD-10-CM | POA: Diagnosis not present

## 2020-07-24 DIAGNOSIS — S93491A Sprain of other ligament of right ankle, initial encounter: Secondary | ICD-10-CM | POA: Diagnosis not present

## 2020-07-24 DIAGNOSIS — S9031XA Contusion of right foot, initial encounter: Secondary | ICD-10-CM | POA: Diagnosis not present

## 2020-08-21 DIAGNOSIS — R21 Rash and other nonspecific skin eruption: Secondary | ICD-10-CM | POA: Diagnosis not present

## 2020-09-03 DIAGNOSIS — Z0000 Encounter for general adult medical examination without abnormal findings: Secondary | ICD-10-CM | POA: Diagnosis not present

## 2020-09-03 DIAGNOSIS — G43909 Migraine, unspecified, not intractable, without status migrainosus: Secondary | ICD-10-CM | POA: Diagnosis not present

## 2020-09-03 DIAGNOSIS — E7800 Pure hypercholesterolemia, unspecified: Secondary | ICD-10-CM | POA: Diagnosis not present

## 2020-09-03 DIAGNOSIS — Z23 Encounter for immunization: Secondary | ICD-10-CM | POA: Diagnosis not present

## 2020-09-03 DIAGNOSIS — K219 Gastro-esophageal reflux disease without esophagitis: Secondary | ICD-10-CM | POA: Diagnosis not present

## 2020-09-03 DIAGNOSIS — I10 Essential (primary) hypertension: Secondary | ICD-10-CM | POA: Diagnosis not present

## 2020-09-16 ENCOUNTER — Other Ambulatory Visit: Payer: Self-pay | Admitting: Obstetrics and Gynecology

## 2020-09-16 DIAGNOSIS — Z1231 Encounter for screening mammogram for malignant neoplasm of breast: Secondary | ICD-10-CM

## 2020-09-25 DIAGNOSIS — K5900 Constipation, unspecified: Secondary | ICD-10-CM | POA: Diagnosis not present

## 2020-09-25 DIAGNOSIS — K219 Gastro-esophageal reflux disease without esophagitis: Secondary | ICD-10-CM | POA: Diagnosis not present

## 2020-09-25 DIAGNOSIS — R112 Nausea with vomiting, unspecified: Secondary | ICD-10-CM | POA: Diagnosis not present

## 2020-09-25 DIAGNOSIS — R197 Diarrhea, unspecified: Secondary | ICD-10-CM | POA: Diagnosis not present

## 2020-10-01 DIAGNOSIS — R300 Dysuria: Secondary | ICD-10-CM | POA: Diagnosis not present

## 2020-10-01 DIAGNOSIS — Z01419 Encounter for gynecological examination (general) (routine) without abnormal findings: Secondary | ICD-10-CM | POA: Diagnosis not present

## 2020-10-16 DIAGNOSIS — G894 Chronic pain syndrome: Secondary | ICD-10-CM | POA: Diagnosis not present

## 2020-10-28 ENCOUNTER — Other Ambulatory Visit: Payer: Self-pay

## 2020-10-28 ENCOUNTER — Ambulatory Visit
Admission: RE | Admit: 2020-10-28 | Discharge: 2020-10-28 | Disposition: A | Discharge status: Home / Self Care | Payer: Federal, State, Local not specified - PPO | Source: Ambulatory Visit | Attending: Obstetrics and Gynecology | Admitting: Obstetrics and Gynecology

## 2020-10-28 DIAGNOSIS — Z1231 Encounter for screening mammogram for malignant neoplasm of breast: Secondary | ICD-10-CM | POA: Diagnosis not present

## 2020-11-06 DIAGNOSIS — K635 Polyp of colon: Secondary | ICD-10-CM | POA: Diagnosis not present

## 2020-11-06 DIAGNOSIS — Z1211 Encounter for screening for malignant neoplasm of colon: Secondary | ICD-10-CM | POA: Diagnosis not present

## 2020-11-06 DIAGNOSIS — Z86010 Personal history of colonic polyps: Secondary | ICD-10-CM | POA: Diagnosis not present

## 2020-11-06 DIAGNOSIS — D123 Benign neoplasm of transverse colon: Secondary | ICD-10-CM | POA: Diagnosis not present

## 2020-11-06 DIAGNOSIS — G4730 Sleep apnea, unspecified: Secondary | ICD-10-CM | POA: Diagnosis not present

## 2020-11-06 DIAGNOSIS — Z800 Family history of malignant neoplasm of digestive organs: Secondary | ICD-10-CM | POA: Diagnosis not present

## 2020-11-13 DIAGNOSIS — F3132 Bipolar disorder, current episode depressed, moderate: Secondary | ICD-10-CM | POA: Diagnosis not present

## 2020-11-13 DIAGNOSIS — F429 Obsessive-compulsive disorder, unspecified: Secondary | ICD-10-CM | POA: Diagnosis not present

## 2020-11-13 DIAGNOSIS — F411 Generalized anxiety disorder: Secondary | ICD-10-CM | POA: Diagnosis not present

## 2020-11-20 DIAGNOSIS — G4733 Obstructive sleep apnea (adult) (pediatric): Secondary | ICD-10-CM | POA: Diagnosis not present

## 2020-11-20 DIAGNOSIS — S76211A Strain of adductor muscle, fascia and tendon of right thigh, initial encounter: Secondary | ICD-10-CM | POA: Diagnosis not present

## 2020-11-20 DIAGNOSIS — G47411 Narcolepsy with cataplexy: Secondary | ICD-10-CM | POA: Diagnosis not present

## 2020-11-20 DIAGNOSIS — G43719 Chronic migraine without aura, intractable, without status migrainosus: Secondary | ICD-10-CM | POA: Diagnosis not present

## 2020-11-20 DIAGNOSIS — G2581 Restless legs syndrome: Secondary | ICD-10-CM | POA: Diagnosis not present

## 2020-12-03 DIAGNOSIS — K219 Gastro-esophageal reflux disease without esophagitis: Secondary | ICD-10-CM | POA: Diagnosis not present

## 2020-12-03 DIAGNOSIS — K581 Irritable bowel syndrome with constipation: Secondary | ICD-10-CM | POA: Diagnosis not present

## 2020-12-03 DIAGNOSIS — D123 Benign neoplasm of transverse colon: Secondary | ICD-10-CM | POA: Diagnosis not present

## 2020-12-03 DIAGNOSIS — G43909 Migraine, unspecified, not intractable, without status migrainosus: Secondary | ICD-10-CM | POA: Diagnosis not present

## 2020-12-03 DIAGNOSIS — D126 Benign neoplasm of colon, unspecified: Secondary | ICD-10-CM | POA: Diagnosis not present

## 2020-12-03 DIAGNOSIS — Z6841 Body Mass Index (BMI) 40.0 and over, adult: Secondary | ICD-10-CM | POA: Diagnosis not present

## 2020-12-03 DIAGNOSIS — R110 Nausea: Secondary | ICD-10-CM | POA: Diagnosis not present

## 2020-12-03 DIAGNOSIS — E669 Obesity, unspecified: Secondary | ICD-10-CM | POA: Diagnosis not present

## 2020-12-03 DIAGNOSIS — K635 Polyp of colon: Secondary | ICD-10-CM | POA: Diagnosis not present

## 2020-12-09 DIAGNOSIS — F411 Generalized anxiety disorder: Secondary | ICD-10-CM | POA: Diagnosis not present

## 2020-12-09 DIAGNOSIS — F429 Obsessive-compulsive disorder, unspecified: Secondary | ICD-10-CM | POA: Diagnosis not present

## 2020-12-09 DIAGNOSIS — F3132 Bipolar disorder, current episode depressed, moderate: Secondary | ICD-10-CM | POA: Diagnosis not present

## 2020-12-23 DIAGNOSIS — G2581 Restless legs syndrome: Secondary | ICD-10-CM | POA: Diagnosis not present

## 2020-12-23 DIAGNOSIS — G43719 Chronic migraine without aura, intractable, without status migrainosus: Secondary | ICD-10-CM | POA: Diagnosis not present

## 2020-12-23 DIAGNOSIS — G4733 Obstructive sleep apnea (adult) (pediatric): Secondary | ICD-10-CM | POA: Diagnosis not present

## 2020-12-23 DIAGNOSIS — G47411 Narcolepsy with cataplexy: Secondary | ICD-10-CM | POA: Diagnosis not present

## 2021-01-05 DIAGNOSIS — F429 Obsessive-compulsive disorder, unspecified: Secondary | ICD-10-CM | POA: Diagnosis not present

## 2021-01-05 DIAGNOSIS — F3132 Bipolar disorder, current episode depressed, moderate: Secondary | ICD-10-CM | POA: Diagnosis not present

## 2021-01-05 DIAGNOSIS — H35033 Hypertensive retinopathy, bilateral: Secondary | ICD-10-CM | POA: Diagnosis not present

## 2021-01-05 DIAGNOSIS — F411 Generalized anxiety disorder: Secondary | ICD-10-CM | POA: Diagnosis not present

## 2021-01-22 DIAGNOSIS — F411 Generalized anxiety disorder: Secondary | ICD-10-CM | POA: Diagnosis not present

## 2021-02-03 DIAGNOSIS — G2581 Restless legs syndrome: Secondary | ICD-10-CM | POA: Diagnosis not present

## 2021-02-03 DIAGNOSIS — G43719 Chronic migraine without aura, intractable, without status migrainosus: Secondary | ICD-10-CM | POA: Diagnosis not present

## 2021-02-03 DIAGNOSIS — G47411 Narcolepsy with cataplexy: Secondary | ICD-10-CM | POA: Diagnosis not present

## 2021-02-05 DIAGNOSIS — F411 Generalized anxiety disorder: Secondary | ICD-10-CM | POA: Diagnosis not present

## 2021-02-12 DIAGNOSIS — G894 Chronic pain syndrome: Secondary | ICD-10-CM | POA: Diagnosis not present

## 2021-02-12 DIAGNOSIS — Z5181 Encounter for therapeutic drug level monitoring: Secondary | ICD-10-CM | POA: Diagnosis not present

## 2021-02-12 DIAGNOSIS — M25551 Pain in right hip: Secondary | ICD-10-CM | POA: Diagnosis not present

## 2021-02-12 DIAGNOSIS — Z79899 Other long term (current) drug therapy: Secondary | ICD-10-CM | POA: Diagnosis not present

## 2021-02-19 DIAGNOSIS — F411 Generalized anxiety disorder: Secondary | ICD-10-CM | POA: Diagnosis not present

## 2021-03-04 DIAGNOSIS — M25551 Pain in right hip: Secondary | ICD-10-CM | POA: Diagnosis not present

## 2021-03-24 DIAGNOSIS — G43719 Chronic migraine without aura, intractable, without status migrainosus: Secondary | ICD-10-CM | POA: Diagnosis not present

## 2021-03-30 DIAGNOSIS — M5136 Other intervertebral disc degeneration, lumbar region: Secondary | ICD-10-CM | POA: Diagnosis not present

## 2021-03-30 DIAGNOSIS — M25551 Pain in right hip: Secondary | ICD-10-CM | POA: Diagnosis not present

## 2021-04-30 DIAGNOSIS — K219 Gastro-esophageal reflux disease without esophagitis: Secondary | ICD-10-CM | POA: Diagnosis not present

## 2021-04-30 DIAGNOSIS — Z86010 Personal history of colonic polyps: Secondary | ICD-10-CM | POA: Diagnosis not present

## 2021-04-30 DIAGNOSIS — E611 Iron deficiency: Secondary | ICD-10-CM | POA: Diagnosis not present

## 2021-04-30 DIAGNOSIS — R1319 Other dysphagia: Secondary | ICD-10-CM | POA: Diagnosis not present

## 2021-04-30 DIAGNOSIS — R197 Diarrhea, unspecified: Secondary | ICD-10-CM | POA: Diagnosis not present

## 2021-04-30 DIAGNOSIS — R1310 Dysphagia, unspecified: Secondary | ICD-10-CM | POA: Diagnosis not present

## 2021-04-30 DIAGNOSIS — D509 Iron deficiency anemia, unspecified: Secondary | ICD-10-CM | POA: Diagnosis not present

## 2021-05-05 DIAGNOSIS — G43719 Chronic migraine without aura, intractable, without status migrainosus: Secondary | ICD-10-CM | POA: Diagnosis not present

## 2021-05-18 DIAGNOSIS — M5459 Other low back pain: Secondary | ICD-10-CM | POA: Diagnosis not present

## 2021-05-18 DIAGNOSIS — M5412 Radiculopathy, cervical region: Secondary | ICD-10-CM | POA: Diagnosis not present

## 2021-05-18 DIAGNOSIS — M5030 Other cervical disc degeneration, unspecified cervical region: Secondary | ICD-10-CM | POA: Diagnosis not present

## 2021-05-18 DIAGNOSIS — Z79891 Long term (current) use of opiate analgesic: Secondary | ICD-10-CM | POA: Diagnosis not present

## 2021-05-29 DIAGNOSIS — K219 Gastro-esophageal reflux disease without esophagitis: Secondary | ICD-10-CM | POA: Diagnosis not present

## 2021-05-29 DIAGNOSIS — R197 Diarrhea, unspecified: Secondary | ICD-10-CM | POA: Diagnosis not present

## 2021-05-31 ENCOUNTER — Emergency Department (HOSPITAL_BASED_OUTPATIENT_CLINIC_OR_DEPARTMENT_OTHER)
Admission: EM | Admit: 2021-05-31 | Discharge: 2021-05-31 | Disposition: A | Discharge status: Home / Self Care | Payer: Federal, State, Local not specified - PPO | Attending: Emergency Medicine | Admitting: Emergency Medicine

## 2021-05-31 ENCOUNTER — Emergency Department (HOSPITAL_BASED_OUTPATIENT_CLINIC_OR_DEPARTMENT_OTHER): Payer: Federal, State, Local not specified - PPO

## 2021-05-31 ENCOUNTER — Encounter (HOSPITAL_BASED_OUTPATIENT_CLINIC_OR_DEPARTMENT_OTHER): Payer: Self-pay | Admitting: *Deleted

## 2021-05-31 ENCOUNTER — Other Ambulatory Visit: Payer: Self-pay

## 2021-05-31 DIAGNOSIS — Z79899 Other long term (current) drug therapy: Secondary | ICD-10-CM | POA: Diagnosis not present

## 2021-05-31 DIAGNOSIS — Z7951 Long term (current) use of inhaled steroids: Secondary | ICD-10-CM | POA: Insufficient documentation

## 2021-05-31 DIAGNOSIS — M545 Low back pain, unspecified: Secondary | ICD-10-CM | POA: Insufficient documentation

## 2021-05-31 DIAGNOSIS — M25571 Pain in right ankle and joints of right foot: Secondary | ICD-10-CM | POA: Diagnosis not present

## 2021-05-31 DIAGNOSIS — G8929 Other chronic pain: Secondary | ICD-10-CM | POA: Diagnosis not present

## 2021-05-31 DIAGNOSIS — I10 Essential (primary) hypertension: Secondary | ICD-10-CM | POA: Diagnosis not present

## 2021-05-31 DIAGNOSIS — J45909 Unspecified asthma, uncomplicated: Secondary | ICD-10-CM | POA: Diagnosis not present

## 2021-05-31 DIAGNOSIS — M7989 Other specified soft tissue disorders: Secondary | ICD-10-CM | POA: Diagnosis not present

## 2021-05-31 MED ORDER — IBUPROFEN 600 MG PO TABS: 600.0000 mg | ORAL_TABLET | Freq: Four times a day (QID) | ORAL | 0 refills | Status: DC | PRN

## 2021-05-31 MED ORDER — IBUPROFEN 800 MG PO TABS
800.0000 mg | ORAL_TABLET | Freq: Once | ORAL | Status: AC
  Administered 2021-05-31: 800 mg via ORAL
  Filled 2021-05-31: qty 1

## 2021-05-31 NOTE — ED Notes (Signed)
Bp remains high, provider made aware, naproxen ordered pt cont to c/o pain 10/10

## 2021-05-31 NOTE — ED Provider Notes (Addendum)
[facility] Provider Note   CSN: [id number] Arrival date & time: 05/31/21  1337     History Chief Complaint  Patient presents with   Ankle Pain    [name] is a 50 y.o. female with a past medical history of chronic lower back pain and osteoporosis presenting today with a complaint of right ankle pain for 1 week.  Patient reports that her ankle began to bother her a week ago however she is not sure why.  No injuries, falls or trauma.  She describes the pain as gnawing on both sides of her right ankle.  No radiation.  It is worsened by twisting and pivoting.  Alleviated with rest.  Patient is on oxycodone and gabapentin for chronic pain and reports neither of these have helped her ankle pain.  Has not tried NSAIDs or ice.  Denies any numbness or tingling.  Is able to ambulate despite pain.   Past Medical History:  Diagnosis Date   Allergy    Anxiety    Asthma    Bipolar 1 disorder ([place])    Chronic lower back pain 02/07/2014   Depression    excessive daytime sleepiness-MSLT confirmed pathological degree of hypersomnia-12/13   Esophageal reflux    Gastroparesis    Hypersomnia    Hypertension    Medication monitoring encounter 02/07/2014   Migraine    Migraine    Migraines    Narcolepsy    OCD (obsessive compulsive disorder)    Opiate use 07/02/2015   Osteoarthritis    knees, hands, shoulder, lower back   Other iron deficiency anemias 08/28/2015   Overactive bladder    Psychosis ([place])    Restless leg syndrome    S/P lumbar fusion 2011   Schizophrenia ([place])    Schizophrenia, schizo-affective ([facility])    Seasonal allergies    Sleep apnea    uses CPAP every night   Torn rotator cuff it:2012,rt:2013    Patient Active Problem List   Diagnosis Date Noted   Daytime sleepiness 01/22/2019   OSA on CPAP 01/21/2019   Neurogenic pain 01/26/2016   Other iron deficiency anemias 08/28/2015   Nondiabetic gastroparesis 08/28/2015   Chronic lower  back pain (L>R) (Primary Pain) 07/02/2015   Medication monitoring encounter 07/02/2015   Chronic pain 07/02/2015   Opiate use 07/02/2015   Long term prescription opiate use 07/02/2015   Long term current use of opiate analgesic 07/02/2015   Encounter for therapeutic drug level monitoring 07/02/2015   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   Epidural fibrosis 07/02/2015   Chronic lumbar radicular pain (Bilateral) (L>R) 07/02/2015   Bilateral lower extremity pain (L>R) 07/02/2015   Drug tolerance to opioids 07/02/2015   Opiate analgesic contract exists 07/02/2015   Lumbar spondylosis 07/02/2015   Lumbar facet hypertrophy 07/02/2015   Grade 1 Retrolisthesis of L1 over L2 07/02/2015   Grade 1 Anterolisthesis of L5 over S1 07/02/2015   Lumbar facet arthropathy 07/02/2015   Gastric atony 05/22/2015   Enteritis 04/06/2015   Gastroparesis due to DM ([place])    Colitis, acute 04/04/2015   Dental infection 04/04/2015   Nausea with vomiting    Leukocytosis 04/03/2015   Nausea and vomiting 04/02/2015   Nausea vomiting and diarrhea 04/02/2015   Abdominal pain 04/02/2015   Extrinsic asthma 04/02/2015   Chronic cholecystitis 07/02/2013   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   UARS (upper airway resistance syndrome) 02/14/2013   Dyspnea 06/09/2011    Past Surgical History:  Procedure  Laterality Date   ABDOMINAL HYSTERECTOMY  2001   ANKLE FUSION     right   CHOLECYSTECTOMY N/A 07/20/2013   Procedure: LAPAROSCOPIC CHOLECYSTECTOMY;  Surgeon: Harl Bowie, MD;  Location: [facility];  Service: General;  Laterality: N/A;   COLONOSCOPY WITH PROPOFOL N/A 10/02/2013   Procedure: COLONOSCOPY WITH PROPOFOL;  Surgeon: Garlan Fair, MD;  Location: [facility];  Service: Endoscopy;  Laterality: N/A;   ESOPHAGOGASTRODUODENOSCOPY (EGD) WITH PROPOFOL N/A 10/02/2013   Procedure: ESOPHAGOGASTRODUODENOSCOPY (EGD) WITH PROPOFOL;  Surgeon: Garlan Fair, MD;  Location: [facility];  Service:  Endoscopy;  Laterality: N/A;   FLEXIBLE SIGMOIDOSCOPY Left 04/07/2015   Procedure: FLEXIBLE SIGMOIDOSCOPY;  Surgeon: Ronald Lobo, MD;  Location: [facility];  Service: Endoscopy;  Laterality: Left;   FRACTURE SURGERY     LAPAROSCOPIC SALPINGO OOPHERECTOMY Left 12/05/2013   Procedure: LAPAROSCOPIC SALPINGO OOPHORECTOMY;  Surgeon: Thurnell Lose, MD;  Location: [facility];  Service: Gynecology;  Laterality: Left;   LUMBAR FUSION  2011   L4-L5   OOPHORECTOMY   2004   right   ROTATOR CUFF REPAIR  2012,2013   torn    SHOULDER SURGERY     left and right , bone spurs, torn muscle   SPINE SURGERY     spurs,ruptured disc bone  2009   TUBAL LIGATION  2004   VESICOVAGINAL FISTULA CLOSURE W/ TAH  2001   WISDOM TOOTH EXTRACTION       OB History   No obstetric history on file.     Family History  Problem Relation Age of Onset   Cancer Mother        breast   Breast cancer Mother        in 68's   Cancer Father        prostate,colon   Hypertension Father    Thyroid disease Father    High Cholesterol Father    Thyroid disease Sister    Cancer Paternal Aunt        colon   Cancer Maternal Grandmother        kidney   Cancer Paternal Grandmother        colon   Cancer Paternal Aunt        lung   Breast cancer Maternal Aunt    Breast cancer Cousin    Narcolepsy Neg Hx     Social History   Tobacco Use   Smoking status: Never   Smokeless tobacco: Never  Substance Use Topics   Alcohol use: No   Drug use: No    Home Medications Prior to Admission medications   Medication Sig Start Date End Date Taking? Authorizing Provider  albuterol (PROVENTIL HFA;VENTOLIN HFA) 108 (90 BASE) MCG/ACT inhaler Inhale 2 puffs into the lungs every 6 (six) hours as needed for wheezing or shortness of breath.    [provider]  amphetamine-dextroamphetamine (ADDERALL) 10 MG tablet 2 tablets PO in AM and one tab at lunch. 06/29/18   Ward Givens, NP  budesonide-formoterol (SYMBICORT) 160-4.5  MCG/ACT inhaler Inhale 2 puffs into the lungs 2 (two) times daily.      [provider]  Clobetasol Propionate (CLOBEX) 0.05 % shampoo Apply 1 application topically every Wednesday.     [provider]  cycloSPORINE (RESTASIS) 0.05 % ophthalmic emulsion Place 1 drop into both eyes every 12 (twelve) hours.      [provider]  diclofenac (VOLTAREN) 50 MG EC tablet Take 1 tablet (50 mg total) by mouth 2 (two) times daily. 12/15/15  Milinda Pointer, MD  dicyclomine (BENTYL) 10 MG capsule Take 10 mg by mouth 4 (four) times daily as needed for spasms.    [provider]  dronabinol (MARINOL) 5 MG capsule TK 1 C PO BID 11/18/15   [provider]  esomeprazole (NEXIUM) 40 MG capsule Take 40 mg by mouth daily at 12 noon.    [provider]  FLUOCINOLONE ACETONIDE SCALP 0.01 % OIL Apply 1 application topically daily. 02/19/15   [provider]  fluocinonide (LIDEX) 0.05 % cream Apply 1 application topically 3 (three) times a week.     [provider]  gabapentin (NEURONTIN) 300 MG capsule Take 1 capsule (300 mg total) by mouth 3 (three) times daily. 01/26/16   Milinda Pointer, MD  hydrochlorothiazide (HYDRODIURIL) 25 MG tablet Take 25 mg by mouth every morning.     [provider]  hydrocortisone (PROCTOCORT) 1 % CREA Apply topically.    [provider]  lidocaine (XYLOCAINE) 2 % jelly 1 application by Other route as needed (rectally).    [provider]  LORazepam (ATIVAN) 0.5 MG tablet Take 0.5 mg by mouth every 6 (six) hours as needed for anxiety.    [provider]  loteprednol (LOTEMAX) 0.2 % SUSP Place 1 drop into both eyes daily.    [provider]  MYRBETRIQ 25 MG TB24 tablet TK 1 T PO QD 11/29/15   [provider]  nitroGLYCERIN (NITROGLYN) 2 % ointment Apply 1 inch topically at bedtime.    [provider]  Olopatadine HCl (PATADAY) 0.2 % SOLN Place 1 drop into  both eyes 2 (two) times daily as needed (for allergies).     [provider]  OnabotulinumtoxinA (BOTOX IJ) Inject as directed every 3 (three) months.    [provider]  pramipexole (MIRAPEX) 1 MG tablet Take 1 mg by mouth at bedtime. 02/19/15   [provider]  PREMARIN 0.3 MG tablet Take 0.3 mg by mouth daily. 10/21/16   [provider]  promethazine (PHENERGAN) 25 MG tablet Take 50 mg by mouth every 6 (six) hours as needed for nausea or vomiting.  01/28/15   [provider]  PROMETHEGAN 25 MG suppository PLACE 1 SUPPOSITORY RECTALLY Q 6 H PRN N FOR UP TO 7 DAYS 01/08/16   [provider]  RECTIV 0.4 % OINT APPLY PEA-SIZED AMOUNT TO RECTAL AREA NIGHTLY FOR 14 DAYS FOR FISSURE TREATMENT. 01/14/16   [provider]  RELPAX 40 MG tablet Take 40 mg by mouth every 2 (two) hours as needed for migraine or headache.  12/04/13   [provider]  saccharomyces boulardii (FLORASTOR) 250 MG capsule Take 1 capsule (250 mg total) by mouth 2 (two) times daily. 04/10/15   Orson Eva, MD  tiZANidine (ZANAFLEX) 4 MG tablet Take 1 tablet (4 mg total) by mouth every 8 (eight) hours as needed for muscle spasms. 01/26/16   Milinda Pointer, MD  topiramate (TOPAMAX) 100 MG tablet Take 100-200 mg by mouth 2 (two) times daily. 1 tab every AM and 2 at night    [provider]  triamcinolone cream (KENALOG) 0.1 % Apply 1 application topically daily as needed (to affected area).  11/21/12   [provider]  verapamil (VERELAN PM) 240 MG 24 hr capsule Take 240 mg by mouth at bedtime.      [provider]  Vitamin D, Ergocalciferol, (DRISDOL) 50000 UNITS CAPS capsule Take 50,000 Units by mouth every 7 (seven) days.  [provider]    Allergies    Tramadol, Naproxen sodium, Adhesive [tape], Aspirin, Benadryl [diphenhydramine hcl], Codeine, Dust mite extract, Metoclopramide, Mold extract [trichophyton], Molds & smuts, Pollen  extract, Prednisone, Toradol [ketorolac tromethamine], and Zofran [ondansetron]  Review of Systems   Review of Systems  Constitutional:  Negative for fever.  Musculoskeletal:  Positive for back pain, gait problem and joint swelling.       No warmth  Skin:  Negative for wound.  Neurological:  Negative for weakness and numbness.  All other systems reviewed and are negative.  Physical Exam Updated Vital Signs BP (!) 199/100 (BP Location: Right Arm)   Pulse 66   Temp 98.5 F (36.9 C) (Oral)   Resp 16   Ht $R'5\' 2"'yQ$  (1.575 m)   Wt 98.9 kg   SpO2 100%   BMI 39.87 kg/m   Physical Exam Vitals and nursing note reviewed.  Constitutional:      Appearance: Normal appearance.  HENT:     Head: Normocephalic and atraumatic.  Eyes:     General: No scleral icterus.    Conjunctiva/sclera: Conjunctivae normal.  Pulmonary:     Effort: Pulmonary effort is normal. No respiratory distress.  Musculoskeletal:        General: Swelling and tenderness present. No deformity. Normal range of motion.     Comments: Patient with full range of motion of the joint.  5 out of 5 strength with dorsiflexion and plantar flexion.  2+ DP pulses.  Normal cap refill.  Patient does have swelling around both the lateral and medial malleolus of her ankle.  Tender to both areas.  Sensation intact.  No wound or obvious deformity.  Skin:    General: Skin is warm and dry.     Findings: No bruising or rash.  Neurological:     Mental Status: She is alert.     Sensory: No sensory deficit.     Motor: No weakness.     Gait: Gait normal.  Psychiatric:        Mood and Affect: Mood normal.        Behavior: Behavior normal.    ED Results / Procedures / Treatments   Labs (all labs ordered are listed, but only abnormal results are displayed) Labs Reviewed - No data to display  EKG None  Radiology DG Ankle Complete Right  Result Date: 05/31/2021 CLINICAL DATA:  Right ankle pain and swelling for 1 week. EXAM: RIGHT ANKLE  - COMPLETE 3+ VIEW COMPARISON:  None. FINDINGS: There is no evidence of acute fracture, dislocation, or joint effusion. There is no evidence of arthropathy. Status post surgical internal fixation of old medial malleolar fracture. Soft tissues are unremarkable. IMPRESSION: No acute abnormality is noted. Electronically Signed   By: Marijo Conception M.D.   On: 05/31/2021 14:51    Procedures Procedures   Medications Ordered in ED Medications - No data to display  ED Course  I have reviewed the triage vital signs and the nursing notes.  Pertinent labs & imaging results that were available during my care of the patient were reviewed by me and considered in my medical decision making (see chart for details).    MDM Rules/Calculators/A&P [name] is a 49 y.o. female with a past medical history of chronic lower back pain and osteoporosis presenting today with a complaint of right ankle pain for 1 week.  Patient reports that her ankle began to bother her a week ago however she is not sure  why.  No injuries, falls or trauma.  She describes the pain as gnawing on both sides of her right ankle.  No radiation.  It is worsened by twisting and pivoting.  Alleviated with rest.  Patient is on oxycodone and gabapentin for chronic pain and reports neither of these have helped her ankle pain.  Has not tried NSAIDs or ice.  Denies any numbness or tingling.  Is able to ambulate despite pain.  Patient evaluated by me in triage.  No signs of warmth or overlying infection of the joint.  I have low suspicion for septic joint and do not believe patient needs CRP or ESR today. Radiograph without signs of fracture.  Due to patient's unexplained symptoms I believe she should follow-up with sports medicine.  I have placed her in an ankle brace for more comfortable ambulation.  We discussed that NSAIDs may better treat her ankle pain than oxycodone and muscle relaxants. I will sent prescription strength ibuprofen  to her  pharmacy and attach a referral to sports medicine for follow-up if her symptoms are not improving over 2 weeks. Patient understanding of this plan.  Patient with elevated blood pressure readings.  These readings came down after pain management.  Still hypertensive however reports some higher pressures at home as well.  Without signs of hypertensive urgency or emergency.  She will follow-up with her primary care provider about her blood pressure control.  Stable for discharge.  Final Clinical Impression(s) / ED Diagnoses Final diagnoses:  Acute right ankle pain    Rx / DC Orders Results and diagnoses were explained to the patient. Return precautions discussed in full. Patient had no additional questions and expressed complete understanding.     Rhae Hammock, PA-C 05/31/21 1625    Rhae Hammock, PA-C 05/31/21 1757    Drenda Freeze, MD 05/31/21 2240

## 2021-05-31 NOTE — ED Triage Notes (Signed)
Pt reports right ankle pain and swelling x 1 week. Denies specific injury. States swelling never completely goes away. States she broke the ankle in 2000 but hasn't given her trouble until now

## 2021-06-15 DIAGNOSIS — M25551 Pain in right hip: Secondary | ICD-10-CM | POA: Diagnosis not present

## 2021-06-18 DIAGNOSIS — M25571 Pain in right ankle and joints of right foot: Secondary | ICD-10-CM | POA: Diagnosis not present

## 2021-06-27 DIAGNOSIS — M25551 Pain in right hip: Secondary | ICD-10-CM | POA: Diagnosis not present

## 2021-06-30 ENCOUNTER — Other Ambulatory Visit: Payer: Self-pay | Admitting: Obstetrics and Gynecology

## 2021-06-30 DIAGNOSIS — Z1231 Encounter for screening mammogram for malignant neoplasm of breast: Secondary | ICD-10-CM

## 2021-07-03 DIAGNOSIS — M87851 Other osteonecrosis, right femur: Secondary | ICD-10-CM | POA: Diagnosis not present

## 2021-07-09 DIAGNOSIS — G43719 Chronic migraine without aura, intractable, without status migrainosus: Secondary | ICD-10-CM | POA: Diagnosis not present

## 2021-07-22 DIAGNOSIS — Z23 Encounter for immunization: Secondary | ICD-10-CM | POA: Diagnosis not present

## 2021-07-22 DIAGNOSIS — J45909 Unspecified asthma, uncomplicated: Secondary | ICD-10-CM | POA: Diagnosis not present

## 2021-07-22 DIAGNOSIS — J309 Allergic rhinitis, unspecified: Secondary | ICD-10-CM | POA: Diagnosis not present

## 2021-08-17 DIAGNOSIS — G894 Chronic pain syndrome: Secondary | ICD-10-CM | POA: Diagnosis not present

## 2021-08-20 DIAGNOSIS — G47419 Narcolepsy without cataplexy: Secondary | ICD-10-CM | POA: Diagnosis not present

## 2021-08-20 DIAGNOSIS — G43719 Chronic migraine without aura, intractable, without status migrainosus: Secondary | ICD-10-CM | POA: Diagnosis not present

## 2021-08-20 DIAGNOSIS — G2581 Restless legs syndrome: Secondary | ICD-10-CM | POA: Diagnosis not present

## 2021-09-04 DIAGNOSIS — K5090 Crohn's disease, unspecified, without complications: Secondary | ICD-10-CM | POA: Diagnosis not present

## 2021-09-04 DIAGNOSIS — I10 Essential (primary) hypertension: Secondary | ICD-10-CM | POA: Diagnosis not present

## 2021-09-04 DIAGNOSIS — E7800 Pure hypercholesterolemia, unspecified: Secondary | ICD-10-CM | POA: Diagnosis not present

## 2021-09-04 DIAGNOSIS — N3281 Overactive bladder: Secondary | ICD-10-CM | POA: Diagnosis not present

## 2021-09-04 DIAGNOSIS — Z0000 Encounter for general adult medical examination without abnormal findings: Secondary | ICD-10-CM | POA: Diagnosis not present

## 2021-10-01 DIAGNOSIS — Z01419 Encounter for gynecological examination (general) (routine) without abnormal findings: Secondary | ICD-10-CM | POA: Diagnosis not present

## 2021-10-12 DIAGNOSIS — G43719 Chronic migraine without aura, intractable, without status migrainosus: Secondary | ICD-10-CM | POA: Diagnosis not present

## 2021-10-12 DIAGNOSIS — R42 Dizziness and giddiness: Secondary | ICD-10-CM | POA: Diagnosis not present

## 2021-10-29 ENCOUNTER — Ambulatory Visit
Admission: RE | Admit: 2021-10-29 | Discharge: 2021-10-29 | Disposition: A | Discharge status: Home / Self Care | Payer: Federal, State, Local not specified - PPO | Source: Ambulatory Visit | Attending: Obstetrics and Gynecology | Admitting: Obstetrics and Gynecology

## 2021-10-29 DIAGNOSIS — Z1231 Encounter for screening mammogram for malignant neoplasm of breast: Secondary | ICD-10-CM

## 2021-10-30 ENCOUNTER — Other Ambulatory Visit: Payer: Self-pay | Admitting: Obstetrics and Gynecology

## 2021-10-30 DIAGNOSIS — R928 Other abnormal and inconclusive findings on diagnostic imaging of breast: Secondary | ICD-10-CM

## 2021-11-03 DIAGNOSIS — H538 Other visual disturbances: Secondary | ICD-10-CM | POA: Diagnosis not present

## 2021-11-03 DIAGNOSIS — Z9181 History of falling: Secondary | ICD-10-CM | POA: Diagnosis not present

## 2021-11-03 DIAGNOSIS — R110 Nausea: Secondary | ICD-10-CM | POA: Diagnosis not present

## 2021-11-03 DIAGNOSIS — R42 Dizziness and giddiness: Secondary | ICD-10-CM | POA: Diagnosis not present

## 2021-11-03 DIAGNOSIS — R2689 Other abnormalities of gait and mobility: Secondary | ICD-10-CM | POA: Diagnosis not present

## 2021-11-11 ENCOUNTER — Ambulatory Visit
Admission: RE | Admit: 2021-11-11 | Discharge: 2021-11-11 | Disposition: A | Discharge status: Home / Self Care | Payer: Federal, State, Local not specified - PPO | Source: Ambulatory Visit | Attending: Obstetrics and Gynecology | Admitting: Obstetrics and Gynecology

## 2021-11-11 DIAGNOSIS — R922 Inconclusive mammogram: Secondary | ICD-10-CM | POA: Diagnosis not present

## 2021-11-11 DIAGNOSIS — R928 Other abnormal and inconclusive findings on diagnostic imaging of breast: Secondary | ICD-10-CM

## 2021-11-16 DIAGNOSIS — Z79891 Long term (current) use of opiate analgesic: Secondary | ICD-10-CM | POA: Diagnosis not present

## 2021-11-24 DIAGNOSIS — G43719 Chronic migraine without aura, intractable, without status migrainosus: Secondary | ICD-10-CM | POA: Diagnosis not present

## 2021-12-07 DIAGNOSIS — K219 Gastro-esophageal reflux disease without esophagitis: Secondary | ICD-10-CM | POA: Diagnosis not present

## 2022-01-02 IMAGING — MG DIGITAL SCREENING BILAT W/ TOMO W/ CAD
8 series · 8 of 24 positions shown · non-contrast
Comparison: Previous exam(s).

CLINICAL DATA: Screening.

EXAM:
DIGITAL SCREENING BILATERAL MAMMOGRAM WITH TOMO AND CAD

[L CC synth-2D]
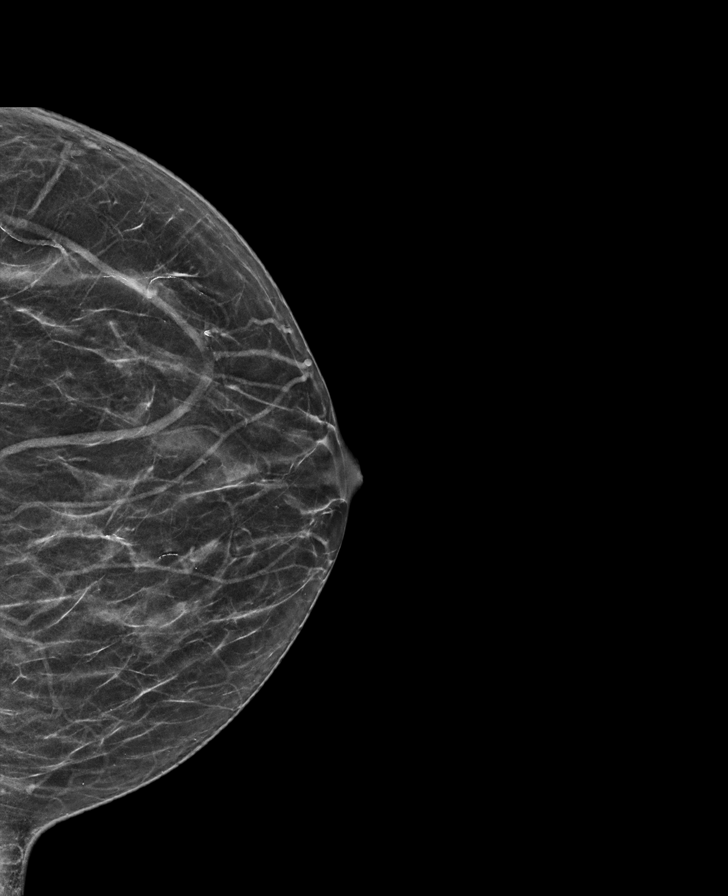

[R CC synth-2D]
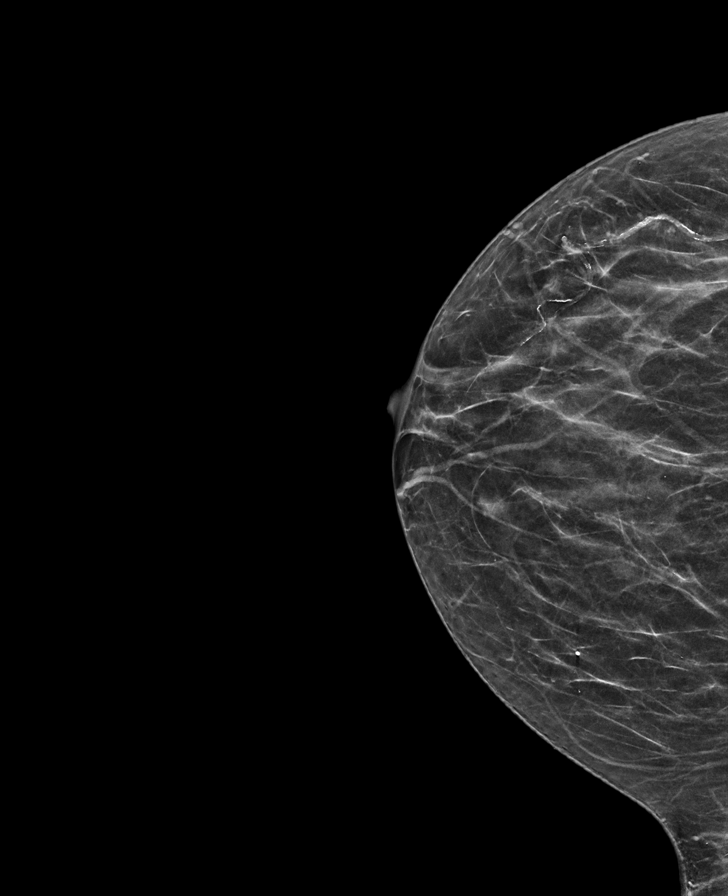

[R MLO synth-2D]
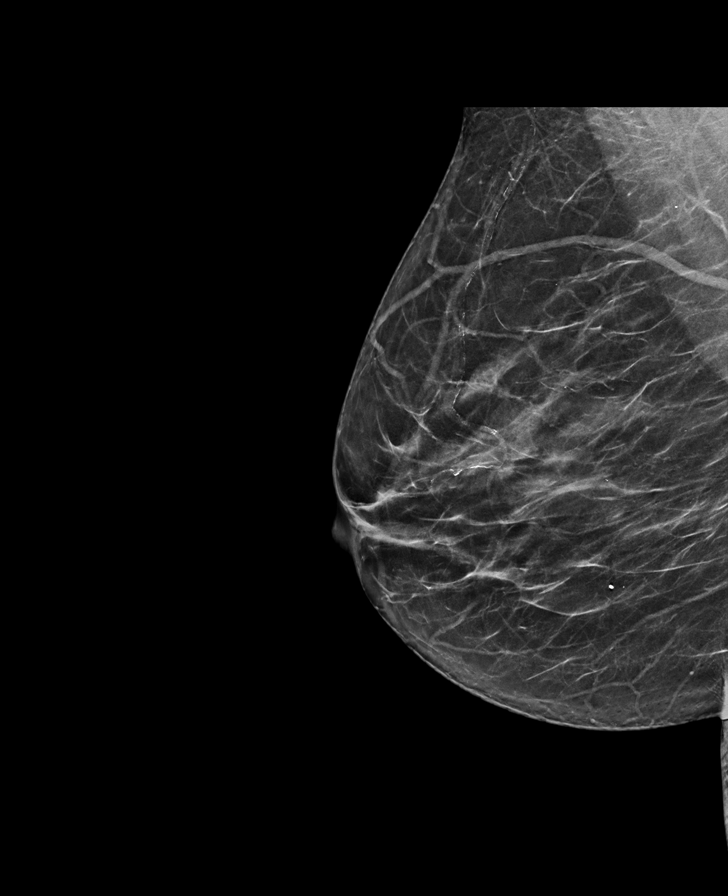

[L MLO synth-2D]
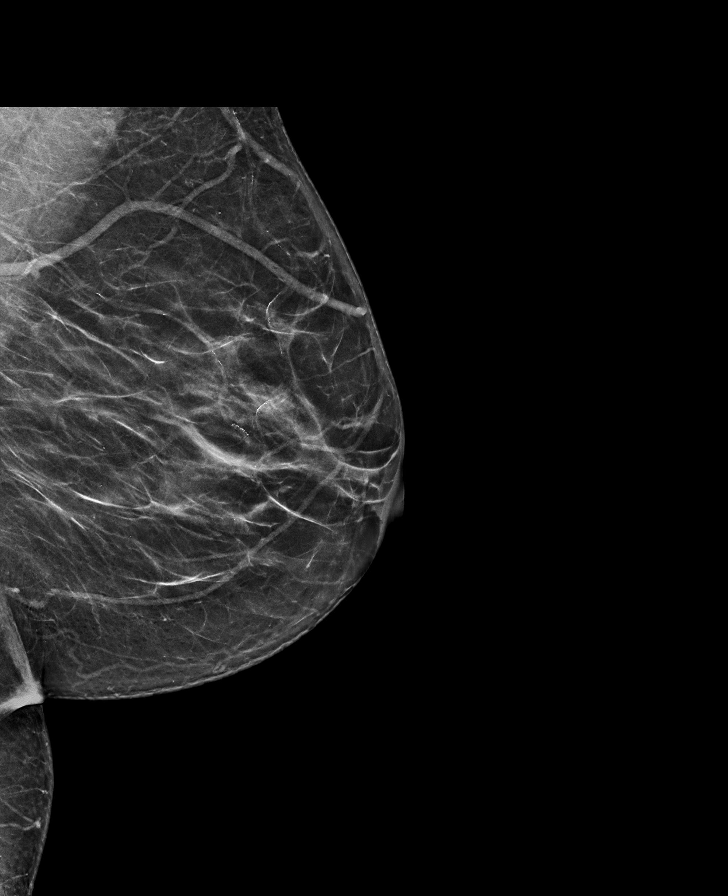

[R MLO tomo · tomo slice 33/64.0]
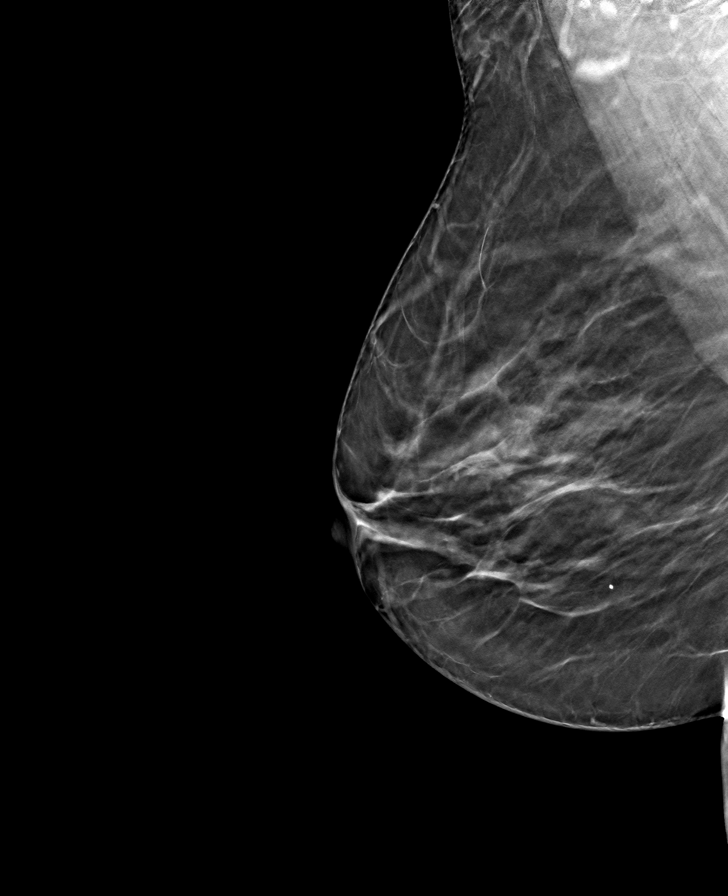

[L MLO tomo · tomo slice 33/64.0]
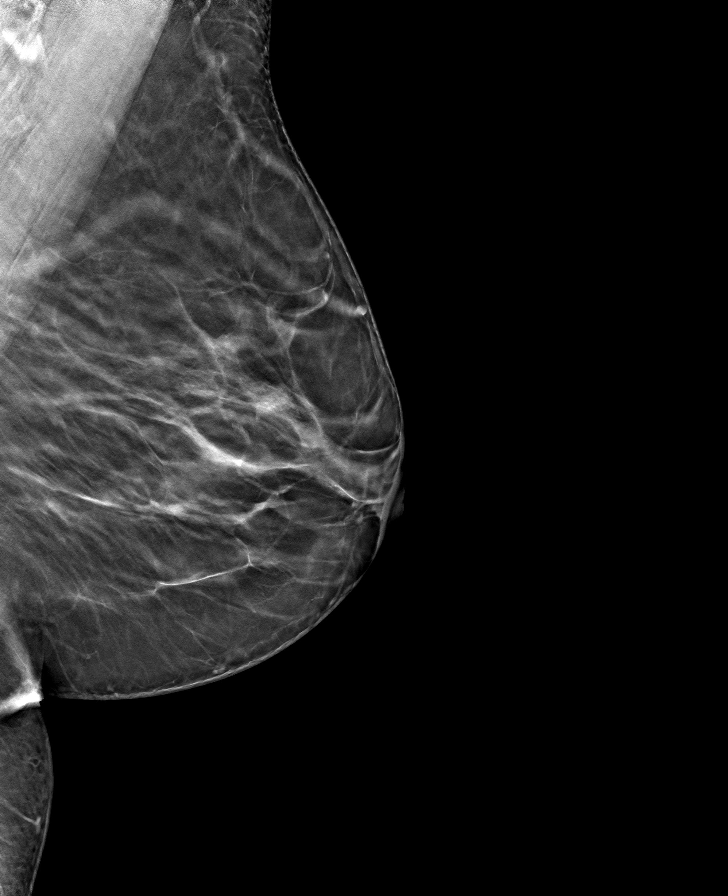

[L CC tomo · tomo slice 29/56.0]
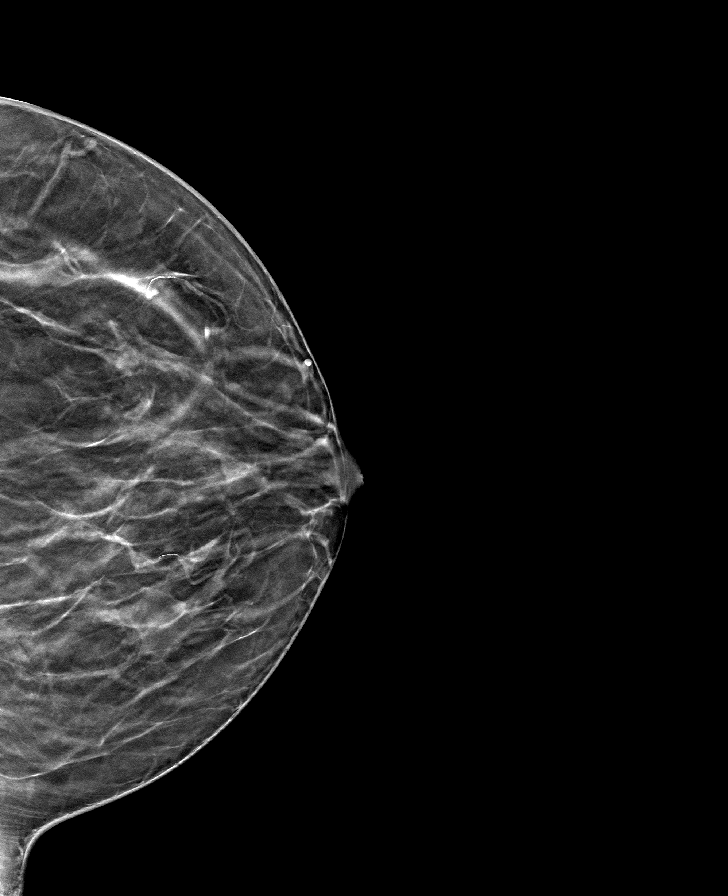

[R CC tomo · tomo slice 27/53.0]
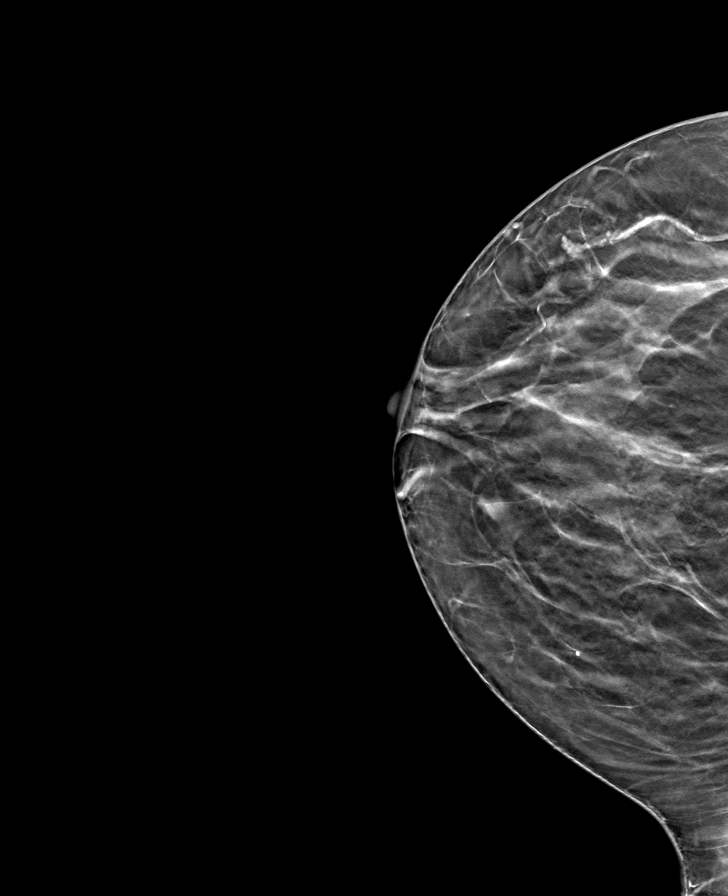

[8 of 24 positions shown; findings below may reference images not displayed]

ACR Breast Density Category b: There are scattered areas of
fibroglandular density.
FINDINGS: There are no findings suspicious for malignancy. Images were
processed with CAD.
IMPRESSION: No mammographic evidence of malignancy. A result letter of this
screening mammogram will be mailed directly to the patient.

RECOMMENDATION:
Screening mammogram in one year. (Code:[id number])

BI-RADS CATEGORY  1: Negative.

## 2022-02-05 DIAGNOSIS — G43719 Chronic migraine without aura, intractable, without status migrainosus: Secondary | ICD-10-CM | POA: Diagnosis not present

## 2022-02-08 DIAGNOSIS — H35033 Hypertensive retinopathy, bilateral: Secondary | ICD-10-CM | POA: Diagnosis not present

## 2022-03-11 DIAGNOSIS — Z6838 Body mass index (BMI) 38.0-38.9, adult: Secondary | ICD-10-CM | POA: Diagnosis not present

## 2022-03-11 DIAGNOSIS — I10 Essential (primary) hypertension: Secondary | ICD-10-CM | POA: Diagnosis not present

## 2022-03-11 DIAGNOSIS — E7800 Pure hypercholesterolemia, unspecified: Secondary | ICD-10-CM | POA: Diagnosis not present

## 2022-03-19 DIAGNOSIS — G43719 Chronic migraine without aura, intractable, without status migrainosus: Secondary | ICD-10-CM | POA: Diagnosis not present

## 2022-03-22 DIAGNOSIS — M5412 Radiculopathy, cervical region: Secondary | ICD-10-CM | POA: Diagnosis not present

## 2022-03-22 DIAGNOSIS — Z5181 Encounter for therapeutic drug level monitoring: Secondary | ICD-10-CM | POA: Diagnosis not present

## 2022-03-22 DIAGNOSIS — Z79891 Long term (current) use of opiate analgesic: Secondary | ICD-10-CM | POA: Diagnosis not present

## 2022-03-22 DIAGNOSIS — M5459 Other low back pain: Secondary | ICD-10-CM | POA: Diagnosis not present

## 2022-03-22 DIAGNOSIS — M5030 Other cervical disc degeneration, unspecified cervical region: Secondary | ICD-10-CM | POA: Diagnosis not present

## 2022-04-08 DIAGNOSIS — R112 Nausea with vomiting, unspecified: Secondary | ICD-10-CM | POA: Diagnosis not present

## 2022-04-08 DIAGNOSIS — R197 Diarrhea, unspecified: Secondary | ICD-10-CM | POA: Diagnosis not present

## 2022-04-08 DIAGNOSIS — R21 Rash and other nonspecific skin eruption: Secondary | ICD-10-CM | POA: Diagnosis not present

## 2022-05-06 DIAGNOSIS — G43719 Chronic migraine without aura, intractable, without status migrainosus: Secondary | ICD-10-CM | POA: Diagnosis not present

## 2022-05-19 DIAGNOSIS — F4310 Post-traumatic stress disorder, unspecified: Secondary | ICD-10-CM | POA: Diagnosis not present

## 2022-05-19 DIAGNOSIS — F411 Generalized anxiety disorder: Secondary | ICD-10-CM | POA: Diagnosis not present

## 2022-05-19 DIAGNOSIS — F314 Bipolar disorder, current episode depressed, severe, without psychotic features: Secondary | ICD-10-CM | POA: Diagnosis not present

## 2022-05-27 DIAGNOSIS — F4310 Post-traumatic stress disorder, unspecified: Secondary | ICD-10-CM | POA: Diagnosis not present

## 2022-05-27 DIAGNOSIS — F314 Bipolar disorder, current episode depressed, severe, without psychotic features: Secondary | ICD-10-CM | POA: Diagnosis not present

## 2022-05-27 DIAGNOSIS — F411 Generalized anxiety disorder: Secondary | ICD-10-CM | POA: Diagnosis not present

## 2022-06-03 DIAGNOSIS — F4310 Post-traumatic stress disorder, unspecified: Secondary | ICD-10-CM | POA: Diagnosis not present

## 2022-06-03 DIAGNOSIS — F411 Generalized anxiety disorder: Secondary | ICD-10-CM | POA: Diagnosis not present

## 2022-06-03 DIAGNOSIS — F314 Bipolar disorder, current episode depressed, severe, without psychotic features: Secondary | ICD-10-CM | POA: Diagnosis not present

## 2022-06-07 DIAGNOSIS — K224 Dyskinesia of esophagus: Secondary | ICD-10-CM | POA: Diagnosis not present

## 2022-06-07 DIAGNOSIS — K5902 Outlet dysfunction constipation: Secondary | ICD-10-CM | POA: Diagnosis not present

## 2022-06-07 DIAGNOSIS — F4320 Adjustment disorder, unspecified: Secondary | ICD-10-CM | POA: Diagnosis not present

## 2022-06-07 DIAGNOSIS — K580 Irritable bowel syndrome with diarrhea: Secondary | ICD-10-CM | POA: Diagnosis not present

## 2022-06-14 DIAGNOSIS — Z23 Encounter for immunization: Secondary | ICD-10-CM | POA: Diagnosis not present

## 2022-06-14 DIAGNOSIS — E7800 Pure hypercholesterolemia, unspecified: Secondary | ICD-10-CM | POA: Diagnosis not present

## 2022-06-17 DIAGNOSIS — G47411 Narcolepsy with cataplexy: Secondary | ICD-10-CM | POA: Diagnosis not present

## 2022-06-17 DIAGNOSIS — G43719 Chronic migraine without aura, intractable, without status migrainosus: Secondary | ICD-10-CM | POA: Diagnosis not present

## 2022-06-17 DIAGNOSIS — G2581 Restless legs syndrome: Secondary | ICD-10-CM | POA: Diagnosis not present

## 2022-06-18 DIAGNOSIS — F314 Bipolar disorder, current episode depressed, severe, without psychotic features: Secondary | ICD-10-CM | POA: Diagnosis not present

## 2022-06-18 DIAGNOSIS — F411 Generalized anxiety disorder: Secondary | ICD-10-CM | POA: Diagnosis not present

## 2022-06-18 DIAGNOSIS — F4310 Post-traumatic stress disorder, unspecified: Secondary | ICD-10-CM | POA: Diagnosis not present

## 2022-06-26 DIAGNOSIS — F4310 Post-traumatic stress disorder, unspecified: Secondary | ICD-10-CM | POA: Diagnosis not present

## 2022-06-26 DIAGNOSIS — F411 Generalized anxiety disorder: Secondary | ICD-10-CM | POA: Diagnosis not present

## 2022-06-26 DIAGNOSIS — F314 Bipolar disorder, current episode depressed, severe, without psychotic features: Secondary | ICD-10-CM | POA: Diagnosis not present

## 2022-07-16 DIAGNOSIS — M5030 Other cervical disc degeneration, unspecified cervical region: Secondary | ICD-10-CM | POA: Diagnosis not present

## 2022-07-16 DIAGNOSIS — M5412 Radiculopathy, cervical region: Secondary | ICD-10-CM | POA: Diagnosis not present

## 2022-07-20 DIAGNOSIS — F4310 Post-traumatic stress disorder, unspecified: Secondary | ICD-10-CM | POA: Diagnosis not present

## 2022-07-20 DIAGNOSIS — F314 Bipolar disorder, current episode depressed, severe, without psychotic features: Secondary | ICD-10-CM | POA: Diagnosis not present

## 2022-07-20 DIAGNOSIS — F411 Generalized anxiety disorder: Secondary | ICD-10-CM | POA: Diagnosis not present

## 2022-07-22 DIAGNOSIS — R059 Cough, unspecified: Secondary | ICD-10-CM | POA: Diagnosis not present

## 2022-07-22 DIAGNOSIS — R0989 Other specified symptoms and signs involving the circulatory and respiratory systems: Secondary | ICD-10-CM | POA: Diagnosis not present

## 2022-07-22 DIAGNOSIS — J069 Acute upper respiratory infection, unspecified: Secondary | ICD-10-CM | POA: Diagnosis not present

## 2022-07-26 ENCOUNTER — Encounter (HOSPITAL_COMMUNITY): Payer: Self-pay

## 2022-07-26 ENCOUNTER — Emergency Department (HOSPITAL_COMMUNITY): Payer: Federal, State, Local not specified - PPO

## 2022-07-26 ENCOUNTER — Emergency Department (HOSPITAL_COMMUNITY)
Admission: EM | Admit: 2022-07-26 | Discharge: 2022-07-26 | Disposition: A | Discharge status: Home / Self Care | Payer: Federal, State, Local not specified - PPO | Attending: Emergency Medicine | Admitting: Emergency Medicine

## 2022-07-26 ENCOUNTER — Other Ambulatory Visit: Payer: Self-pay

## 2022-07-26 DIAGNOSIS — T7840XA Allergy, unspecified, initial encounter: Secondary | ICD-10-CM | POA: Diagnosis not present

## 2022-07-26 DIAGNOSIS — R059 Cough, unspecified: Secondary | ICD-10-CM | POA: Diagnosis not present

## 2022-07-26 DIAGNOSIS — L299 Pruritus, unspecified: Secondary | ICD-10-CM | POA: Diagnosis not present

## 2022-07-26 DIAGNOSIS — T360X5A Adverse effect of penicillins, initial encounter: Secondary | ICD-10-CM | POA: Diagnosis not present

## 2022-07-26 MED ORDER — PREDNISONE 20 MG PO TABS: 10.0000 mg | ORAL_TABLET | Freq: Every day | ORAL | 0 refills | Status: DC

## 2022-07-26 MED ORDER — CETIRIZINE HCL 5 MG/5ML PO SOLN
10.0000 mg | Freq: Once | ORAL | Status: AC
  Administered 2022-07-26: 10 mg via ORAL
  Filled 2022-07-26: qty 10

## 2022-07-26 MED ORDER — PREDNISONE 5 MG PO TABS
10.0000 mg | ORAL_TABLET | Freq: Once | ORAL | Status: AC
  Administered 2022-07-26: 10 mg via ORAL
  Filled 2022-07-26: qty 2

## 2022-07-26 MED ORDER — FAMOTIDINE 20 MG PO TABS
20.0000 mg | ORAL_TABLET | Freq: Once | ORAL | Status: AC
  Administered 2022-07-26: 20 mg via ORAL
  Filled 2022-07-26: qty 1

## 2022-07-26 MED ORDER — FAMOTIDINE 20 MG PO TABS: 20.0000 mg | ORAL_TABLET | Freq: Two times a day (BID) | ORAL | 0 refills | Status: DC

## 2022-07-26 NOTE — ED Triage Notes (Addendum)
Reports allergic reaction that started this am to amoxicillin and a decongestant.  Patient reports tongue and throat are itching along with eyes are burning.  Reports now she has dark circles under the eyes. No breathing difficulty noted. Patient is able to swallow and handle secretions. Patient refuses benadryl says it sends her nervous system into over drive. Patient has a long list of allergies.

## 2022-07-26 NOTE — ED Provider Notes (Signed)
[facility] Emergency Department Provider Note MRN:  [id number]  Arrival date & time: 07/26/22     Chief Complaint   Allergic Reaction   History of Present Illness   [name] is a 51 y.o. year-old female presents to the ED with chief complaint of allergic reaction.  States that she started antibiotics a day or so ago with amoxicillin and a decongestant.  She denies fever, vomiting, rash.  She states that she has had some itching around her eyes and some itchy sensation of her tongue.  Was treated with pepcid and zyrtec in triage.  States that she can't take benadryl.  History provided by patient.   Review of Systems  Pertinent positive and negative review of systems noted in HPI.    Physical Exam   Vitals:   07/26/22 2053 07/26/22 2234  BP: (!) 155/101 (!) 186/113  Pulse: 76 77  Resp: 16 14  Temp:  98.2 F (36.8 C)  SpO2: 99% 100%    CONSTITUTIONAL:  non toxic-appearing, NAD NEURO:  Alert and oriented x 3, CN 3-12 grossly intact EYES:  eyes equal and reactive ENT/NECK:  Supple, no stridor, airway intact, normal phonation CARDIO:  normal rate, regular rhythm, appears well-perfused  PULM:  No respiratory distress, CTAB GI/GU:  non-distended,  MSK/SPINE:  No gross deformities, no edema, moves all extremities  SKIN:  no rash, atraumatic   *Additional and/or pertinent findings included in MDM below  Diagnostic and Interventional Summary    EKG Interpretation  Date/Time:    Ventricular Rate:    PR Interval:    QRS Duration:   QT Interval:    QTC Calculation:   R Axis:     Text Interpretation:         Labs Reviewed - No data to display  DG Chest 2 View  Final Result      Medications  cetirizine HCl (Zyrtec) 5 MG/5ML solution 10 mg (10 mg Oral Given 07/26/22 2245)  famotidine (PEPCID) tablet 20 mg (20 mg Oral Given 07/26/22 1636)  predniSONE (DELTASONE) tablet 10 mg (10 mg Oral Given 07/26/22 2244)     Procedures  /   Critical Care Procedures  ED Course and Medical Decision Making  I have reviewed the triage vital signs, the nursing notes, and pertinent available records from the EMR.  Social Determinants Affecting Complexity of Care: Patient has no clinically significant social determinants affecting this chief complaint..   ED Course:    Medical Decision Making Patient here with suspected allergic reaction to amox.  Will discontinue this.  She's had a cough for about a month.  CXR is negative.  She's not had a fever or productive cough. I doubt that abx would even be beneficial.  Will discontinue abx all together and trial short course of low dose prednisone which should help with reaction as well.  Patient is comfortable with this plan.   Amount and/or Complexity of Data Reviewed Radiology: ordered and independent interpretation performed.    Details: No effusion or opacity.  Risk Prescription drug management.     Consultants: No consultations were needed in caring for this patient.   Treatment and Plan: Emergency department workup does not suggest an emergent condition requiring admission or immediate intervention beyond  what has been performed at this time. The patient is safe for discharge and has  been instructed to return immediately for worsening symptoms, change in  symptoms or any other concerns    Final Clinical Impressions(s) /  ED Diagnoses     ICD-10-CM   1. Allergic reaction, initial encounter  T78.40XA       ED Discharge Orders          Ordered    predniSONE (DELTASONE) 20 MG tablet  Daily        07/26/22 2254    famotidine (PEPCID) 20 MG tablet  2 times daily        07/26/22 2254              Discharge Instructions Discussed with and Provided to Patient:     Discharge Instructions      Discontinue the antibiotic and the decongestant.  Continue with your regular medications.  If symptoms change or worsen, please return to the ER or follow-up with  your doctor.       Montine Circle, PA-C 07/26/22 2259    Davonna Belling, MD 07/26/22 [phone]

## 2022-07-26 NOTE — Discharge Instructions (Signed)
Discontinue the antibiotic and the decongestant.  Continue with your regular medications.  If symptoms change or worsen, please return to the ER or follow-up with your doctor.

## 2022-07-26 NOTE — ED Provider Triage Note (Signed)
Emergency Medicine Provider Triage Evaluation Note  [name] , a 51 y.o. female  was evaluated in triage.  Pt complains of allergic reaction.  Patient states that symptoms began around 930 this morning.  She states she has been taking amoxicillin and decongestion given by her primary care provider for the last 4 days or so.  She reports taking Xyzal daily but missed her dose this morning.  Current symptoms include bilateral eye itching/burning as well as mouth/tongue itching.  Denies difficulty breathing/swallowing.  Is taking no medication for this.  Presents emergency department for further evaluation.  Review of Systems  Positive: See above Negative:   Physical Exam  BP (!) 165/96 (BP Location: Left Arm)   Pulse 100   Temp 98.1 F (36.7 C) (Oral)   Resp 18   Ht '5\' 3"'$  (1.6 m)   Wt 98.9 kg   SpO2 100%   BMI 38.62 kg/m  Gen:   Awake, no distress   Resp:  Normal effort  MSK:   Moves extremities without difficulty  Other:  No obvious stridor.  Patient tolerating oral secretions without difficulty.  No obvious sublingual or submandibular swelling noted.  Medical Decision Making  Medically screening exam initiated at 4:26 PM.  Appropriate orders placed.  [name] was informed that the remainder of the evaluation will be completed by another provider, this initial triage assessment does not replace that evaluation, and the importance of remaining in the ED until their evaluation is complete.     Wilnette Kales, [place] 07/26/22 1649

## 2022-07-26 NOTE — ED Notes (Signed)
Patients bp 186/113 (134) Patient stated that is high for her, but that is how it has been since she has been here. Xray here for patient. NT will take bp again when patient returns. RN notified.

## 2022-08-12 DIAGNOSIS — G43719 Chronic migraine without aura, intractable, without status migrainosus: Secondary | ICD-10-CM | POA: Diagnosis not present

## 2022-09-07 DIAGNOSIS — K219 Gastro-esophageal reflux disease without esophagitis: Secondary | ICD-10-CM | POA: Diagnosis not present

## 2022-09-07 DIAGNOSIS — I10 Essential (primary) hypertension: Secondary | ICD-10-CM | POA: Diagnosis not present

## 2022-09-07 DIAGNOSIS — Z5181 Encounter for therapeutic drug level monitoring: Secondary | ICD-10-CM | POA: Diagnosis not present

## 2022-09-07 DIAGNOSIS — E7800 Pure hypercholesterolemia, unspecified: Secondary | ICD-10-CM | POA: Diagnosis not present

## 2022-09-07 DIAGNOSIS — G43909 Migraine, unspecified, not intractable, without status migrainosus: Secondary | ICD-10-CM | POA: Diagnosis not present

## 2022-09-07 DIAGNOSIS — Z0000 Encounter for general adult medical examination without abnormal findings: Secondary | ICD-10-CM | POA: Diagnosis not present

## 2022-09-07 DIAGNOSIS — Z6841 Body Mass Index (BMI) 40.0 and over, adult: Secondary | ICD-10-CM | POA: Diagnosis not present

## 2022-09-28 DIAGNOSIS — Z79899 Other long term (current) drug therapy: Secondary | ICD-10-CM | POA: Diagnosis not present

## 2022-09-28 DIAGNOSIS — G2581 Restless legs syndrome: Secondary | ICD-10-CM | POA: Diagnosis not present

## 2022-09-28 DIAGNOSIS — G43719 Chronic migraine without aura, intractable, without status migrainosus: Secondary | ICD-10-CM | POA: Diagnosis not present

## 2022-10-12 ENCOUNTER — Other Ambulatory Visit: Payer: Self-pay | Admitting: Obstetrics and Gynecology

## 2022-10-12 DIAGNOSIS — Z1231 Encounter for screening mammogram for malignant neoplasm of breast: Secondary | ICD-10-CM

## 2022-10-14 DIAGNOSIS — Z01419 Encounter for gynecological examination (general) (routine) without abnormal findings: Secondary | ICD-10-CM | POA: Diagnosis not present

## 2022-10-26 DIAGNOSIS — R928 Other abnormal and inconclusive findings on diagnostic imaging of breast: Secondary | ICD-10-CM | POA: Diagnosis not present

## 2022-11-10 DIAGNOSIS — G47419 Narcolepsy without cataplexy: Secondary | ICD-10-CM | POA: Diagnosis not present

## 2022-11-10 DIAGNOSIS — G43719 Chronic migraine without aura, intractable, without status migrainosus: Secondary | ICD-10-CM | POA: Diagnosis not present

## 2022-11-10 DIAGNOSIS — Z79899 Other long term (current) drug therapy: Secondary | ICD-10-CM | POA: Diagnosis not present

## 2022-11-10 DIAGNOSIS — G2581 Restless legs syndrome: Secondary | ICD-10-CM | POA: Diagnosis not present

## 2022-11-10 DIAGNOSIS — G4733 Obstructive sleep apnea (adult) (pediatric): Secondary | ICD-10-CM | POA: Diagnosis not present

## 2022-11-15 DIAGNOSIS — M5412 Radiculopathy, cervical region: Secondary | ICD-10-CM | POA: Diagnosis not present

## 2022-11-15 DIAGNOSIS — M5030 Other cervical disc degeneration, unspecified cervical region: Secondary | ICD-10-CM | POA: Diagnosis not present

## 2022-11-15 DIAGNOSIS — M5459 Other low back pain: Secondary | ICD-10-CM | POA: Diagnosis not present

## 2022-11-15 DIAGNOSIS — Z79891 Long term (current) use of opiate analgesic: Secondary | ICD-10-CM | POA: Diagnosis not present

## 2022-11-25 DIAGNOSIS — I10 Essential (primary) hypertension: Secondary | ICD-10-CM | POA: Diagnosis not present

## 2022-11-25 DIAGNOSIS — N3281 Overactive bladder: Secondary | ICD-10-CM | POA: Diagnosis not present

## 2022-11-25 DIAGNOSIS — E7800 Pure hypercholesterolemia, unspecified: Secondary | ICD-10-CM | POA: Diagnosis not present

## 2022-11-25 DIAGNOSIS — F99 Mental disorder, not otherwise specified: Secondary | ICD-10-CM | POA: Diagnosis not present

## 2022-12-09 DIAGNOSIS — R109 Unspecified abdominal pain: Secondary | ICD-10-CM | POA: Diagnosis not present

## 2022-12-09 DIAGNOSIS — K2100 Gastro-esophageal reflux disease with esophagitis, without bleeding: Secondary | ICD-10-CM | POA: Diagnosis not present

## 2022-12-09 DIAGNOSIS — R110 Nausea: Secondary | ICD-10-CM | POA: Diagnosis not present

## 2022-12-09 DIAGNOSIS — K9089 Other intestinal malabsorption: Secondary | ICD-10-CM | POA: Diagnosis not present

## 2022-12-15 DIAGNOSIS — M6281 Muscle weakness (generalized): Secondary | ICD-10-CM | POA: Diagnosis not present

## 2022-12-15 DIAGNOSIS — M62838 Other muscle spasm: Secondary | ICD-10-CM | POA: Diagnosis not present

## 2022-12-15 DIAGNOSIS — R351 Nocturia: Secondary | ICD-10-CM | POA: Diagnosis not present

## 2022-12-15 DIAGNOSIS — R3915 Urgency of urination: Secondary | ICD-10-CM | POA: Diagnosis not present

## 2022-12-15 DIAGNOSIS — R197 Diarrhea, unspecified: Secondary | ICD-10-CM | POA: Diagnosis not present

## 2022-12-15 DIAGNOSIS — N393 Stress incontinence (female) (male): Secondary | ICD-10-CM | POA: Diagnosis not present

## 2023-01-04 DIAGNOSIS — R448 Other symptoms and signs involving general sensations and perceptions: Secondary | ICD-10-CM | POA: Diagnosis not present

## 2023-01-04 DIAGNOSIS — J302 Other seasonal allergic rhinitis: Secondary | ICD-10-CM | POA: Diagnosis not present

## 2023-01-04 DIAGNOSIS — J320 Chronic maxillary sinusitis: Secondary | ICD-10-CM | POA: Diagnosis not present

## 2023-01-04 DIAGNOSIS — Z8709 Personal history of other diseases of the respiratory system: Secondary | ICD-10-CM | POA: Diagnosis not present

## 2023-01-05 DIAGNOSIS — M6250 Muscle wasting and atrophy, not elsewhere classified, unspecified site: Secondary | ICD-10-CM | POA: Diagnosis not present

## 2023-01-05 DIAGNOSIS — R197 Diarrhea, unspecified: Secondary | ICD-10-CM | POA: Diagnosis not present

## 2023-01-05 DIAGNOSIS — R351 Nocturia: Secondary | ICD-10-CM | POA: Diagnosis not present

## 2023-01-05 DIAGNOSIS — M62838 Other muscle spasm: Secondary | ICD-10-CM | POA: Diagnosis not present

## 2023-01-05 DIAGNOSIS — R3915 Urgency of urination: Secondary | ICD-10-CM | POA: Diagnosis not present

## 2023-01-05 DIAGNOSIS — N393 Stress incontinence (female) (male): Secondary | ICD-10-CM | POA: Diagnosis not present

## 2023-01-12 DIAGNOSIS — M6281 Muscle weakness (generalized): Secondary | ICD-10-CM | POA: Diagnosis not present

## 2023-01-12 DIAGNOSIS — M62838 Other muscle spasm: Secondary | ICD-10-CM | POA: Diagnosis not present

## 2023-01-12 DIAGNOSIS — N3941 Urge incontinence: Secondary | ICD-10-CM | POA: Diagnosis not present

## 2023-01-12 DIAGNOSIS — R197 Diarrhea, unspecified: Secondary | ICD-10-CM | POA: Diagnosis not present

## 2023-01-12 DIAGNOSIS — R3915 Urgency of urination: Secondary | ICD-10-CM | POA: Diagnosis not present

## 2023-01-12 DIAGNOSIS — R351 Nocturia: Secondary | ICD-10-CM | POA: Diagnosis not present

## 2023-01-25 DIAGNOSIS — M1711 Unilateral primary osteoarthritis, right knee: Secondary | ICD-10-CM | POA: Diagnosis not present

## 2023-01-25 DIAGNOSIS — M7052 Other bursitis of knee, left knee: Secondary | ICD-10-CM | POA: Diagnosis not present

## 2023-01-25 DIAGNOSIS — M7051 Other bursitis of knee, right knee: Secondary | ICD-10-CM | POA: Diagnosis not present

## 2023-02-02 DIAGNOSIS — R3915 Urgency of urination: Secondary | ICD-10-CM | POA: Diagnosis not present

## 2023-02-02 DIAGNOSIS — R351 Nocturia: Secondary | ICD-10-CM | POA: Diagnosis not present

## 2023-02-02 DIAGNOSIS — R350 Frequency of micturition: Secondary | ICD-10-CM | POA: Diagnosis not present

## 2023-02-02 DIAGNOSIS — R197 Diarrhea, unspecified: Secondary | ICD-10-CM | POA: Diagnosis not present

## 2023-02-08 DIAGNOSIS — G43709 Chronic migraine without aura, not intractable, without status migrainosus: Secondary | ICD-10-CM | POA: Diagnosis not present

## 2023-03-08 DIAGNOSIS — M7912 Myalgia of auxiliary muscles, head and neck: Secondary | ICD-10-CM | POA: Diagnosis not present

## 2023-03-17 DIAGNOSIS — M5459 Other low back pain: Secondary | ICD-10-CM | POA: Diagnosis not present

## 2023-03-17 DIAGNOSIS — G894 Chronic pain syndrome: Secondary | ICD-10-CM | POA: Diagnosis not present

## 2023-03-17 DIAGNOSIS — Z79899 Other long term (current) drug therapy: Secondary | ICD-10-CM | POA: Diagnosis not present

## 2023-03-17 DIAGNOSIS — M5412 Radiculopathy, cervical region: Secondary | ICD-10-CM | POA: Diagnosis not present

## 2023-03-17 DIAGNOSIS — Z5181 Encounter for therapeutic drug level monitoring: Secondary | ICD-10-CM | POA: Diagnosis not present

## 2023-03-22 DIAGNOSIS — G47411 Narcolepsy with cataplexy: Secondary | ICD-10-CM | POA: Diagnosis not present

## 2023-03-22 DIAGNOSIS — G43709 Chronic migraine without aura, not intractable, without status migrainosus: Secondary | ICD-10-CM | POA: Diagnosis not present

## 2023-03-30 DIAGNOSIS — S161XXA Strain of muscle, fascia and tendon at neck level, initial encounter: Secondary | ICD-10-CM | POA: Diagnosis not present

## 2023-03-30 DIAGNOSIS — M542 Cervicalgia: Secondary | ICD-10-CM | POA: Diagnosis not present

## 2023-03-30 DIAGNOSIS — M5412 Radiculopathy, cervical region: Secondary | ICD-10-CM | POA: Diagnosis not present

## 2023-04-27 DIAGNOSIS — N6042 Mammary duct ectasia of left breast: Secondary | ICD-10-CM | POA: Diagnosis not present

## 2023-04-27 DIAGNOSIS — R922 Inconclusive mammogram: Secondary | ICD-10-CM | POA: Diagnosis not present

## 2023-04-28 DIAGNOSIS — M542 Cervicalgia: Secondary | ICD-10-CM | POA: Diagnosis not present

## 2023-05-11 DIAGNOSIS — G43719 Chronic migraine without aura, intractable, without status migrainosus: Secondary | ICD-10-CM | POA: Diagnosis not present

## 2023-06-01 DIAGNOSIS — J45909 Unspecified asthma, uncomplicated: Secondary | ICD-10-CM | POA: Diagnosis not present

## 2023-06-01 DIAGNOSIS — K219 Gastro-esophageal reflux disease without esophagitis: Secondary | ICD-10-CM | POA: Diagnosis not present

## 2023-06-01 DIAGNOSIS — Z23 Encounter for immunization: Secondary | ICD-10-CM | POA: Diagnosis not present

## 2023-06-01 DIAGNOSIS — E7800 Pure hypercholesterolemia, unspecified: Secondary | ICD-10-CM | POA: Diagnosis not present

## 2023-06-01 DIAGNOSIS — I10 Essential (primary) hypertension: Secondary | ICD-10-CM | POA: Diagnosis not present

## 2023-06-22 DIAGNOSIS — Z79891 Long term (current) use of opiate analgesic: Secondary | ICD-10-CM | POA: Diagnosis not present

## 2023-06-22 DIAGNOSIS — M1711 Unilateral primary osteoarthritis, right knee: Secondary | ICD-10-CM | POA: Diagnosis not present

## 2023-06-22 DIAGNOSIS — M5412 Radiculopathy, cervical region: Secondary | ICD-10-CM | POA: Diagnosis not present

## 2023-06-22 DIAGNOSIS — M5030 Other cervical disc degeneration, unspecified cervical region: Secondary | ICD-10-CM | POA: Diagnosis not present

## 2023-06-22 DIAGNOSIS — M5459 Other low back pain: Secondary | ICD-10-CM | POA: Diagnosis not present

## 2023-07-12 ENCOUNTER — Other Ambulatory Visit: Payer: Self-pay

## 2023-07-12 ENCOUNTER — Ambulatory Visit (INDEPENDENT_AMBULATORY_CARE_PROVIDER_SITE_OTHER): Payer: Federal, State, Local not specified - PPO | Admitting: Internal Medicine

## 2023-07-12 ENCOUNTER — Encounter: Payer: Self-pay | Admitting: Internal Medicine

## 2023-07-12 VITALS — BP 130/82 | HR 90 | Temp 97.9°F | Resp 17 | Ht 63.0 in | Wt 209.0 lb

## 2023-07-12 DIAGNOSIS — J4530 Mild persistent asthma, uncomplicated: Secondary | ICD-10-CM

## 2023-07-12 DIAGNOSIS — G4733 Obstructive sleep apnea (adult) (pediatric): Secondary | ICD-10-CM

## 2023-07-12 DIAGNOSIS — J3089 Other allergic rhinitis: Secondary | ICD-10-CM

## 2023-07-12 MED ORDER — MONTELUKAST SODIUM 10 MG PO TABS: 10.0000 mg | ORAL_TABLET | Freq: Every day | ORAL | 5 refills | Status: DC

## 2023-07-12 MED ORDER — ALBUTEROL SULFATE HFA 108 (90 BASE) MCG/ACT IN AERS: 1.0000 | INHALATION_SPRAY | Freq: Four times a day (QID) | RESPIRATORY_TRACT | 1 refills | Status: DC | PRN

## 2023-07-12 MED ORDER — BUDESONIDE-FORMOTEROL FUMARATE 160-4.5 MCG/ACT IN AERO: 2.0000 | INHALATION_SPRAY | Freq: Every day | RESPIRATORY_TRACT | 5 refills | Status: DC

## 2023-07-12 MED ORDER — AZELASTINE HCL 0.1 % NA SOLN: 2.0000 | Freq: Two times a day (BID) | NASAL | 5 refills | Status: DC | PRN

## 2023-07-12 NOTE — Addendum Note (Signed)
Addended by: Dub Mikes on: 07/12/2023 10:30 AM   Modules accepted: Orders

## 2023-07-12 NOTE — Patient Instructions (Addendum)
Other Allergic Rhinitis: - Use nasal saline rinses before nose sprays such as with Neilmed Sinus Rinse.  Use distilled water.   - Use Azelastine 1-2 sprays each nostril twice daily as needed for runny nose, drainage, sneezing, congestion. Aim upward and outward. - Use Xyzal 5mg  and Claritin 10mg  daily.  - Use Singulair 10mg  daily.  Stop if there are any mood/behavioral changes. - Hold all anti histamines 3 days prior to next visit (Azelastine, Xyzal, Claritin, Pepcid, Benadryl, Allegra, Zyrtec etc).   Mild Persistent Asthma: - Maintenance inhaler: start Symbicort 160-4.76mcg 2 puffs daily rather than as needed. Continue Singulair 10mg  daily.   - Rescue inhaler: Albuterol 2 puffs via spacer or 1 vial via nebulizer every 4-6 hours as needed for respiratory symptoms of cough, shortness of breath, or wheezing Asthma control goals:  Full participation in all desired activities (may need albuterol before activity) Albuterol use two times or less a week on average (not counting use with activity) Cough interfering with sleep two times or less a month Oral steroids no more than once a year No hospitalizations   OSA on CPAP - Wear CPAP anytime you sleep.  Use a humidifier.    Follow up: 11/13 at 8:30 AM for skin testing

## 2023-07-12 NOTE — Progress Notes (Signed)
NEW PATIENT  Date of Service/Encounter:  07/12/23  Consult requested by: Clovis Riley, L.August Saucer, MD (Inactive)   Subjective:   [name] (DOB: 1971/01/30) is a 52 y.o. female who presents to the clinic on 07/12/2023 with a chief complaint of Allergic Rhinitis  .    History obtained from: chart review and patient.   Asthma:  Started in adulthood.   It has done well over the years but has noted some worsening the last year.  Previously used to see Pulm Dr Vassie Loll but now mostly PCP has been able to manage it.    Has noted worsening symptoms recently with flare up of allergies that cause wheezing and shortness of breath requiring use of her inhalers  1-3 times a week daytime symptoms in past month, sometimes  nighttime awakenings in past month Using rescue inhaler: 2-3x/ in week in Fall, less during other season. Limitations to daily activity: none 1 ED visits/UC visits and 0 oral steroids in the past year 0 number of lifetime hospitalizations, 0 number of lifetime intubations.  Identified Triggers:  allergies and respiratory illness Prior PFTs or spirometry: not available for review  Previously used therapies: not sure   Current regimen:  Maintenance: Symbicort PRN Rescue: Albuterol 2 puffs q4-6 hrs PRN  Rhinitis:  Started since she was little. It has worsened since last year.   Symptoms include: nasal congestion, rhinorrhea, post nasal drainage, sneezing, and itchy eyes, cough Occurs seasonally-Spring  but now even year around   Potential triggers: ragweed, mold, dust mites   Treatments tried:  OTC anti histamines - Xyzal, Claritin Pepcid  Singulair Azelastine daily Flonase in the past did not help; tried it for several months   Previous allergy testing: yes when she was little, can't recall how long it was. History of sinus surgery: no Nonallergic triggers: none   Past Medical History: Past Medical History:  Diagnosis Date   Allergy    Anxiety    Asthma     Bipolar 1 disorder (HCC)    Chronic lower back pain 02/07/2014   Depression    excessive daytime sleepiness-MSLT confirmed pathological degree of hypersomnia-12/13   Eczema    Esophageal reflux    Gastroparesis    Hypersomnia    Hypertension    Medication monitoring encounter 02/07/2014   Migraine    Migraine    Migraines    Narcolepsy    OCD (obsessive compulsive disorder)    Opiate use 07/02/2015   Osteoarthritis    knees, hands, shoulder, lower back   Other iron deficiency anemias 08/28/2015   Overactive bladder    Psychosis (HCC)    Restless leg syndrome    S/P lumbar fusion 09/06/2009   Schizophrenia (HCC)    Schizophrenia, schizo-affective (HCC)    Seasonal allergies    Sleep apnea    uses CPAP every night   Torn rotator cuff it:2012,rt:2013    Past Surgical History: Past Surgical History:  Procedure Laterality Date   ABDOMINAL HYSTERECTOMY  2001   ANKLE FUSION     right   CHOLECYSTECTOMY N/A 07/20/2013   Procedure: LAPAROSCOPIC CHOLECYSTECTOMY;  Surgeon: Shelly Rubenstein, MD;  Location: [facility] OR;  Service: General;  Laterality: N/A;   COLONOSCOPY WITH PROPOFOL N/A 10/02/2013   Procedure: COLONOSCOPY WITH PROPOFOL;  Surgeon: Charolett Bumpers, MD;  Location: [facility];  Service: Endoscopy;  Laterality: N/A;   ESOPHAGOGASTRODUODENOSCOPY (EGD) WITH PROPOFOL N/A 10/02/2013   Procedure: ESOPHAGOGASTRODUODENOSCOPY (EGD) WITH PROPOFOL;  Surgeon: Charolett Bumpers, MD;  Location: [facility];  Service: Endoscopy;  Laterality: N/A;   FLEXIBLE SIGMOIDOSCOPY Left 04/07/2015   Procedure: FLEXIBLE SIGMOIDOSCOPY;  Surgeon: Bernette Redbird, MD;  Location: [facility];  Service: Endoscopy;  Laterality: Left;   FRACTURE SURGERY     LAPAROSCOPIC SALPINGO OOPHERECTOMY Left 12/05/2013   Procedure: LAPAROSCOPIC SALPINGO OOPHORECTOMY;  Surgeon: Geryl Rankins, MD;  Location: [facility] ORS;  Service: Gynecology;  Laterality: Left;   LUMBAR FUSION  2011   L4-L5   OOPHORECTOMY   2004   right    ROTATOR CUFF REPAIR  2012,2013   torn    SHOULDER SURGERY     left and right , bone spurs, torn muscle   SPINE SURGERY     spurs,ruptured disc bone  2009   TUBAL LIGATION  2004   VESICOVAGINAL FISTULA CLOSURE W/ TAH  2001   WISDOM TOOTH EXTRACTION      Family History: Family History  Problem Relation Age of Onset   Cancer Mother        breast   Breast cancer Mother        in 66's   Cancer Father        prostate,colon   Hypertension Father    Thyroid disease Father    High Cholesterol Father    Eczema Sister    Asthma Sister    Thyroid disease Sister    Breast cancer Maternal Aunt    Cancer Paternal Aunt        colon   Cancer Paternal Aunt        lung   Cancer Maternal Grandmother        kidney   Asthma Maternal Grandfather    Cancer Paternal Grandmother        colon   Breast cancer Cousin    Narcolepsy Neg Hx     Social History:  Flooring in bedroom: Engineer, civil (consulting) Pets: dog Tobacco use/exposure: none  Job: none  Medication List:  Allergies as of 07/12/2023       Reactions   Tramadol    Naproxen Sodium    Adhesive [tape] Rash   Glue from steri strips   Aspirin Nausea Only   Benadryl [diphenhydramine Hcl] Other (See Comments)   Makes her hyper, jittery   Codeine Nausea And Vomiting   Dust Mite Extract Other (See Comments)   Cough, sneeze, eyes runny   Metoclopramide    nervous   Mold Extract [trichophyton] Other (See Comments)   Cough, sneeze, eyes/nose runny   Molds & Smuts    Cough, sneeze, eyes/nose runny   Pollen Extract Other (See Comments)   Cough, sneeze, eyes/nose runny   Prednisone Other (See Comments)   Makes her hyper, jittery   Toradol [ketorolac Tromethamine] Anxiety   Zofran [ondansetron] Nausea And Vomiting        Medication List        Accurate as of July 12, 2023 10:05 AM. If you have any questions, ask your nurse or doctor.          albuterol 108 (90 Base) MCG/ACT inhaler Commonly known as: VENTOLIN HFA Inhale 2  puffs into the lungs every 6 (six) hours as needed for wheezing or shortness of breath.   amphetamine-dextroamphetamine 10 MG tablet Commonly known as: Adderall 2 tablets PO in AM and one tab at lunch.   atorvastatin 40 MG tablet Commonly known as: LIPITOR 1 tablet Orally Once a day   BOTOX IJ Inject as directed every 3 (three) months.   budesonide-formoterol 160-4.5 MCG/ACT inhaler  Commonly known as: SYMBICORT Inhale 2 puffs into the lungs 2 (two) times daily.   Clobex 0.05 % shampoo Generic drug: Clobetasol Propionate Apply 1 application topically every Wednesday.   clotrimazole-betamethasone lotion Commonly known as: LOTRISONE 1 application to affected area Externally Twice a day as needed   colestipol 1 g tablet Commonly known as: COLESTID 1 tablets Orally Once a day   cyclobenzaprine 10 MG tablet Commonly known as: FLEXERIL Oral for 10 Days   cycloSPORINE 0.05 % ophthalmic emulsion Commonly known as: RESTASIS Place 1 drop into both eyes every 12 (twelve) hours.   dexamethasone 2 MG tablet Commonly known as: DECADRON 1 tablet Orally every 12 hrs for 5 days   diclofenac 50 MG EC tablet Commonly known as: VOLTAREN Take 1 tablet (50 mg total) by mouth 2 (two) times daily.   dicyclomine 10 MG capsule Commonly known as: BENTYL Take 10 mg by mouth 4 (four) times daily as needed for spasms.   dronabinol 5 MG capsule Commonly known as: MARINOL TK 1 C PO BID   esomeprazole 40 MG capsule Commonly known as: NEXIUM Take 40 mg by mouth daily at 12 noon.   estradiol 1 MG tablet Commonly known as: ESTRACE 1 tablet Orally Once a day   famotidine 20 MG tablet Commonly known as: PEPCID Take 1 tablet (20 mg total) by mouth 2 (two) times daily.   Fluocinolone Acetonide Scalp 0.01 % Oil Apply 1 application topically daily.   fluocinonide cream 0.05 % Commonly known as: LIDEX Apply 1 application topically 3 (three) times a week.   gabapentin 300 MG  capsule Commonly known as: Neurontin Take 1 capsule (300 mg total) by mouth 3 (three) times daily.   hydrochlorothiazide 25 MG tablet Commonly known as: HYDRODIURIL Take 25 mg by mouth every morning.   hydrocortisone 1 % Crea Commonly known as: PROCTOCORT Apply topically.   ibuprofen 600 MG tablet Commonly known as: ADVIL Take 1 tablet (600 mg total) by mouth every 6 (six) hours as needed.   lidocaine 2 % jelly Commonly known as: XYLOCAINE 1 application by Other route as needed (rectally).   LORazepam 0.5 MG tablet Commonly known as: ATIVAN Take 0.5 mg by mouth every 6 (six) hours as needed for anxiety.   loteprednol 0.2 % Susp Commonly known as: LOTEMAX Place 1 drop into both eyes daily.   lurasidone 40 MG Tabs tablet Commonly known as: LATUDA Take by mouth.   montelukast 10 MG tablet Commonly known as: SINGULAIR Take 1 tablet by mouth daily.   Myrbetriq 25 MG Tb24 tablet Generic drug: mirabegron ER TK 1 T PO QD   nitroGLYCERIN 2 % ointment Commonly known as: NITROGLYN Apply 1 inch topically at bedtime.   Nurtec 75 MG Tbdp Generic drug: Rimegepant Sulfate Take by mouth.   Oxycodone HCl 10 MG Tabs Take 10 mg by mouth 3 (three) times daily as needed.   Pataday 0.2 % Soln Generic drug: Olopatadine HCl Place 1 drop into both eyes 2 (two) times daily as needed (for allergies).   pramipexole 1 MG tablet Commonly known as: MIRAPEX Take 1 mg by mouth at bedtime.   predniSONE 20 MG tablet Commonly known as: DELTASONE Take 0.5 tablets (10 mg total) by mouth daily.   Premarin 0.3 MG tablet Generic drug: estrogens (conjugated) Take 0.3 mg by mouth daily.   promethazine 25 MG tablet Commonly known as: PHENERGAN Take 50 mg by mouth every 6 (six) hours as needed for nausea or vomiting.   Promethegan  25 MG suppository Generic drug: promethazine PLACE 1 SUPPOSITORY RECTALLY Q 6 H PRN N FOR UP TO 7 DAYS   Rectiv 0.4 % Oint Generic drug: Nitroglycerin APPLY  PEA-SIZED AMOUNT TO RECTAL AREA NIGHTLY FOR 14 DAYS FOR FISSURE TREATMENT.   Relpax 40 MG tablet Generic drug: eletriptan Take 40 mg by mouth every 2 (two) hours as needed for migraine or headache.   saccharomyces boulardii 250 MG capsule Commonly known as: Florastor Take 1 capsule (250 mg total) by mouth 2 (two) times daily.   tiZANidine 4 MG tablet Commonly known as: ZANAFLEX Take 1 tablet (4 mg total) by mouth every 8 (eight) hours as needed for muscle spasms.   Topamax 100 MG tablet Generic drug: topiramate Take 100-200 mg by mouth 2 (two) times daily. 1 tab every AM and 2 at night   triamcinolone cream 0.1 % Commonly known as: KENALOG Apply 1 application topically daily as needed (to affected area).   valACYclovir 1000 MG tablet Commonly known as: VALTREX 1 tablet Orally Once a day   verapamil 240 MG 24 hr capsule Commonly known as: VERELAN PM Take 240 mg by mouth at bedtime.   Vitamin D (Ergocalciferol) 1.25 MG (50000 UNIT) Caps capsule Commonly known as: DRISDOL Take 50,000 Units by mouth every 7 (seven) days.         REVIEW OF SYSTEMS: Pertinent positives and negatives discussed in HPI.   Objective:   Physical Exam: BP 130/82 (BP Location: Left Arm, Patient Position: Sitting, Cuff Size: Large)   Pulse 90   Temp 97.9 F (36.6 C) (Temporal)   Resp 17   Ht 5\' 3"  (1.6 m)   Wt 209 lb (94.8 kg)   SpO2 96%   BMI 37.02 kg/m  Body mass index is 37.02 kg/m. GEN: alert, well developed HEENT: clear conjunctiva, TM grey and translucent, nose with + moderate inferior turbinate hypertrophy, pink nasal mucosa, slight clear rhinorrhea,no cobblestoning HEART: regular rate and rhythm, no murmur LUNGS: clear to auscultation bilaterally, no coughing, unlabored respiration ABDOMEN: soft, non distended  SKIN: no rashes or lesions  Reviewed:  06/01/2023: seen by Jacqulyn Bath MD for allergic rhinitis with nasal congestion, cough, sneezing, itchy eyes. Referred to Allergy.  Also with hx of asthma and OSA. Has PRN Albuterol, Symbicort, Singulair, Azelastine, Flonase.   03/22/2023: seen by Neurology for migraines undergoing botox. Also with narcolepsy on adderall.  Nurtec started every other day PRN.  01/04/2023: seen by Dr Ernestene Kiel ENT [facility] for sinus issues, congestion, rhinorrhea, fluid behind ears, scratchy throat.  Takes Flonase, Claritin/Xyzal, Singulair. One course of antibiotics in the last year.  Discussed CT sinus as scope was negative.   Nasal endoscopy 01/04/2023: Septum straight. Inferior turbinates 2+. Nasal airways patent. Middle turbinate, middle meatus appear normal without polyps, purulence, or mucosal edema.   06/09/2011: self interpreted spirometry No obstruction by ratio.  Normal FEV1 and FVC.  No restriction/obstruction. Overall normal spirometry.    Spirometry:  Tracings reviewed. Her effort: It was hard to get consistent efforts and there is a question as to whether this reflects a maximal maneuver. FVC: 1.85L, 67% FEV1: 1.42L, 64% predicted FEV1/FVC ratio: 77% Interpretation: Spirometry consistent with possible restrictive disease.  Please see scanned spirometry results for details.    Assessment:   1. Other allergic rhinitis   2. OSA on CPAP   3. Mild persistent asthma without complication     Plan/Recommendations:  Other Allergic Rhinitis: - Due to turbinate hypertrophy, seasonal symptoms and unresponsive to over the counter  meds, will perform skin testing to identify aeroallergen triggers.   - Use nasal saline rinses before nose sprays such as with Neilmed Sinus Rinse.  Use distilled water.   - Use Azelastine 1-2 sprays each nostril twice daily as needed for runny nose, drainage, sneezing, congestion. Aim upward and outward. - Use Xyzal 5mg  and Claritin 10mg  daily.  - Use Singulair 10mg  daily.  Stop if there are any mood/behavioral changes. - Hold all anti histamines 3 days prior to next visit (Azelastine, Xyzal, Claritin, Pepcid,  Benadryl)    Mild Persistent Asthma: - MDI technique discussed.  Spirometry without obstruction, questionable restriction possible related to BMI or suboptimal effort.  - Maintenance inhaler: start Symbicort 160-4.58mcg 2 puffs daily. Continue Singulair 10mg  daily.  - Rescue inhaler: Albuterol 2 puffs via spacer or 1 vial via nebulizer every 4-6 hours as needed for respiratory symptoms of cough, shortness of breath, or wheezing Asthma control goals:  Full participation in all desired activities (may need albuterol before activity) Albuterol use two times or less a week on average (not counting use with activity) Cough interfering with sleep two times or less a month Oral steroids no more than once a year No hospitalizations   OSA on CPAP - Wear CPAP anytime you sleep.  Use a humidifier.    Follow up: 11/13 at 8:30 AM for skin testing 1-55     No follow-ups on file.  Alesia Morin, MD [facility]

## 2023-07-20 ENCOUNTER — Ambulatory Visit (INDEPENDENT_AMBULATORY_CARE_PROVIDER_SITE_OTHER): Payer: Federal, State, Local not specified - PPO | Admitting: Internal Medicine

## 2023-07-20 DIAGNOSIS — J301 Allergic rhinitis due to pollen: Secondary | ICD-10-CM

## 2023-07-20 DIAGNOSIS — J3089 Other allergic rhinitis: Secondary | ICD-10-CM | POA: Diagnosis not present

## 2023-07-20 DIAGNOSIS — J3081 Allergic rhinitis due to animal (cat) (dog) hair and dander: Secondary | ICD-10-CM

## 2023-07-20 MED ORDER — LEVOCETIRIZINE DIHYDROCHLORIDE 5 MG PO TABS: 5.0000 mg | ORAL_TABLET | Freq: Every evening | ORAL | 5 refills | Status: DC

## 2023-07-20 NOTE — Patient Instructions (Addendum)
Allergic Rhinitis: - SPT 07/2023: trees, grasses, weeds, cats, dogs, cockroach, dust mites, molds  - Use nasal saline rinses before nose sprays such as with Neilmed Sinus Rinse.  Use distilled water.   - Use Nasonex or Nasacort 2 sprays each nostril daily.  Aim upward and outward.  - Use Azelastine 1-2 sprays each nostril twice daily as needed for runny nose, drainage, sneezing, congestion. Aim upward and outward. - Use Xyzal 5mg  and Claritin 10mg  daily.  - Use Singulair 10mg  daily.  Stop if there are any mood/behavioral changes. - Consider allergy shots for long term control.   Mild Persistent Asthma: - Maintenance inhaler: continue Symbicort 160-4.52mcg 2 puffs daily rather than as needed. Continue Singulair 10mg  daily.   - Rescue inhaler: Albuterol 2 puffs via spacer or 1 vial via nebulizer every 4-6 hours as needed for respiratory symptoms of cough, shortness of breath, or wheezing Asthma control goals:  Full participation in all desired activities (may need albuterol before activity) Albuterol use two times or less a week on average (not counting use with activity) Cough interfering with sleep two times or less a month Oral steroids no more than once a year No hospitalizations   OSA on CPAP - Wear CPAP anytime you sleep.  Use a humidifier.      ALLERGEN AVOIDANCE MEASURES   Dust Mites Use central air conditioning and heat; and change the filter monthly.  Pleated filters work better than mesh filters.  Electrostatic filters may also be used; wash the filter monthly.  Window air conditioners may be used, but do not clean the air as well as a central air conditioner.  Change or wash the filter monthly. Keep windows closed.  Do not use attic fans.   Encase the mattress, box springs and pillows with zippered, dust proof covers. Wash the bed linens in hot water weekly.   Remove carpet, especially from the bedroom. Remove stuffed animals, throw pillows, dust ruffles, heavy drapes and  other items that collect dust from the bedroom. Do not use a humidifier.   Use wood, vinyl or leather furniture instead of cloth furniture in the bedroom. Keep the indoor humidity at 30 - 40%.  Monitor with a humidity gauge.  Molds - Indoor avoidance Use air conditioning to reduce indoor humidity.  Do not use a humidifier. Keep indoor humidity at 30 - 40%.  Use a dehumidifier if needed. In the bathroom use an exhaust fan or open a window after showering.  Wipe down damp surfaces after showering.  Clean bathrooms with a mold-killing solution (diluted bleach, or products like Tilex, etc) at least once a month. In the kitchen use an exhaust fan to remove steam from cooking.  Throw away spoiled foods immediately, and empty garbage daily.  Empty water pans below self-defrosting refrigerators frequently. Vent the clothes dryer to the outside. Limit indoor houseplants; mold grows in the dirt.  No houseplants in the bedroom. Remove carpet from the bedroom. Encase the mattress and box springs with a zippered encasing.  Molds - Outdoor avoidance Avoid being outside when the grass is being mowed, or the ground is tilled. Avoid playing in leaves, pine straw, hay, etc.  Dead plant materials contain mold. Avoid going into barns or grain storage areas. Remove leaves, clippings and compost from around the home.  Cockroach Limit spread of food around the house; especially keep food out of bedrooms. Keep food and garbage in closed containers with a tight lid.  Never leave food out in the kitchen.  Do not leave out pet food or dirty food bowls. Mop the kitchen floor and wash countertops at least once a week. Repair leaky pipes and faucets so there is no standing water to attract roaches. Plug up cracks in the house through which cockroaches can enter. Use bait stations and approved pesticides to reduce cockroach infestation. Pollen Avoidance Pollen levels are highest during the mid-day and afternoon.   Consider this when planning outdoor activities. Avoid being outside when the grass is being mowed, or wear a mask if the pollen-allergic person must be the one to mow the grass. Keep the windows closed to keep pollen outside of the home. Use an air conditioner to filter the air. Take a shower, wash hair, and change clothing after working or playing outdoors during pollen season. Pet Dander Keep the pet out of your bedroom and restrict it to only a few rooms. Be advised that keeping the pet in only one room will not limit the allergens to that room. Don't pet, hug or kiss the pet; if you do, wash your hands with soap and water. High-efficiency particulate air (HEPA) cleaners run continuously in a bedroom or living room can reduce allergen levels over time. Regular use of a high-efficiency vacuum cleaner or a central vacuum can reduce allergen levels. Giving your pet a bath at least once a week can reduce airborne allergen.

## 2023-07-20 NOTE — Progress Notes (Signed)
FOLLOW UP Date of Service/Encounter:  07/20/23   Subjective:  [name] (DOB: 12-Sep-1970) is a 52 y.o. female [facility] for follow up for skin testing.   History obtained from: chart review and patient.  Anti histamines held.   Past Medical History: Past Medical History:  Diagnosis Date   Allergy    Anxiety    Asthma    Bipolar 1 disorder (HCC)    Chronic lower back pain 02/07/2014   Depression    excessive daytime sleepiness-MSLT confirmed pathological degree of hypersomnia-12/13   Eczema    Esophageal reflux    Gastroparesis    Hypersomnia    Hypertension    Medication monitoring encounter 02/07/2014   Migraine    Migraine    Migraines    Narcolepsy    OCD (obsessive compulsive disorder)    Opiate use 07/02/2015   Osteoarthritis    knees, hands, shoulder, lower back   Other iron deficiency anemias 08/28/2015   Overactive bladder    Psychosis (HCC)    Restless leg syndrome    S/P lumbar fusion 09/06/2009   Schizophrenia (HCC)    Schizophrenia, schizo-affective (HCC)    Seasonal allergies    Sleep apnea    uses CPAP every night   Torn rotator cuff it:2012,rt:2013    Objective:  There were no vitals taken for this visit. There is no height or weight on file to calculate BMI. Physical Exam: GEN: alert, well developed HEENT: clear conjunctiva, MMM LUNGS: unlabored respiration   Skin Testing:  Skin prick testing was placed, which includes aeroallergens/foods, histamine control, and saline control.  Verbal consent was obtained prior to placing test.  Patient tolerated procedure well.  Allergy testing results were read and interpreted by myself, documented by clinical staff. Adequate positive and negative control.  Positive results to:  Results discussed with patient/family.  Airborne Adult Perc - 07/20/23 0911     Time Antigen Placed 0911    Allergen Manufacturer Waynette Buttery    Location Back     Number of Test 55    Panel 1 Select    2. Control-Histamine 3+    3. Bahia Negative    4. French Southern Territories Negative    5. Johnson Negative    6. Kentucky Blue 3+    7. Meadow Fescue 2+    8. Perennial Rye 3+    9. Timothy 3+    10. Ragweed Mix 3+    11. Cocklebur 2+    12. Plantain,  English Negative    13. Baccharis Negative    14. Dog Fennel Negative    15. Russian Thistle Negative    16. Lamb's Quarters 2+    17. Sheep Sorrell 2+    18. Rough Pigweed 2+    19. Marsh Elder, Rough Negative    20. Mugwort, Common Negative    21. Box, Elder Negative    22. Cedar, red 3+    23. Sweet Gum Negative    24. Pecan Pollen 3+    25. Pine Mix 3+    26. Walnut, Black Pollen 3+    27. Red Mulberry Negative    28. Ash Mix Negative    29. Birch Mix Negative    30. Beech American Negative    31. Cottonwood, Guinea[place]iss[place] Negative    32. Hickory, White 3+    33. Maple Mix 3+    34. Oak, [place] Mix 3+    35. Sycamore  Eastern Negative    36. Alternaria Alternata Negative    37. Cladosporium Herbarum Negative    38. Aspergillus Mix Negative    39. Penicillium Mix Negative    40. Bipolaris Sorokiniana (Helminthosporium) Negative    41. Drechslera Spicifera (Curvularia) Negative    42. Mucor Plumbeus Negative    43. Fusarium Moniliforme Negative    44. Aureobasidium Pullulans (pullulara) Negative    45. Rhizopus Oryzae Negative    46. Botrytis Cinera Negative    47. Epicoccum Nigrum Negative    48. Phoma Betae Negative    49. Dust Mite Mix Negative    50. Cat Hair 10,000 BAU/ml 3+    51.  Dog Epithelia 3+    52. Mixed Feathers Negative    53. Horse Epithelia Negative    54. Cockroach, German 2+    55. Tobacco Leaf Negative             Intradermal - 07/20/23 0912     Time Antigen Placed 0912    Allergen Manufacturer Waynette Buttery    Location Arm    Number of Test 9    Control Negative    Bahia Negative    French Southern Territories Negative    Johnson Negative    Mold 1 2+    Mold 2 2+    Mold 3 2+     Mold 4 Negative    Mite Mix 3+              Assessment:   1. Seasonal allergic rhinitis due to pollen   2. Allergic rhinitis due to animal hair or dander   3. Allergic rhinitis due to insect   4. Allergic rhinitis caused by mold   5. Allergic rhinitis due to dust mite     Plan/Recommendations:  Allergic Rhinitis: - Due to turbinate hypertrophy, seasonal symptoms, asthma flare ups related to allergic rhinitis and unresponsive to over the counter meds, will perform skin testing to identify aeroallergen triggers.  - SPT 07/2023: trees, grasses, weeds, cats, dogs, cockroach, dust mites, molds  - Use nasal saline rinses before nose sprays such as with Neilmed Sinus Rinse.  Use distilled water.   - Use Nasonex or Nasacort 2 sprays each nostril daily.  Aim upward and outward.  - Use Azelastine 1-2 sprays each nostril twice daily as needed for runny nose, drainage, sneezing, congestion. Aim upward and outward. - Use Xyzal 5mg  and Claritin 10mg  daily.  - Use Singulair 10mg  daily.  Stop if there are any mood/behavioral changes.  Mild Persistent Asthma: - Maintenance inhaler: continue Symbicort 160-4.9mcg 2 puffs daily rather than as needed. Continue Singulair 10mg  daily.   - Rescue inhaler: Albuterol 2 puffs via spacer or 1 vial via nebulizer every 4-6 hours as needed for respiratory symptoms of cough, shortness of breath, or wheezing Asthma control goals:  Full participation in all desired activities (may need albuterol before activity) Albuterol use two times or less a week on average (not counting use with activity) Cough interfering with sleep two times or less a month Oral steroids no more than once a year No hospitalizations   OSA on CPAP - Wear CPAP anytime you sleep.  Use a humidifier.      ALLERGEN AVOIDANCE MEASURES   Dust Mites Use central air conditioning and heat; and change the filter monthly.  Pleated filters work better than mesh filters.  Electrostatic filters  may also be used; wash the filter monthly.  Window air conditioners may be used, but do not  clean the air as well as a central air conditioner.  Change or wash the filter monthly. Keep windows closed.  Do not use attic fans.   Encase the mattress, box springs and pillows with zippered, dust proof covers. Wash the bed linens in hot water weekly.   Remove carpet, especially from the bedroom. Remove stuffed animals, throw pillows, dust ruffles, heavy drapes and other items that collect dust from the bedroom. Do not use a humidifier.   Use wood, vinyl or leather furniture instead of cloth furniture in the bedroom. Keep the indoor humidity at 30 - 40%.  Monitor with a humidity gauge.  Molds - Indoor avoidance Use air conditioning to reduce indoor humidity.  Do not use a humidifier. Keep indoor humidity at 30 - 40%.  Use a dehumidifier if needed. In the bathroom use an exhaust fan or open a window after showering.  Wipe down damp surfaces after showering.  Clean bathrooms with a mold-killing solution (diluted bleach, or products like Tilex, etc) at least once a month. In the kitchen use an exhaust fan to remove steam from cooking.  Throw away spoiled foods immediately, and empty garbage daily.  Empty water pans below self-defrosting refrigerators frequently. Vent the clothes dryer to the outside. Limit indoor houseplants; mold grows in the dirt.  No houseplants in the bedroom. Remove carpet from the bedroom. Encase the mattress and box springs with a zippered encasing.  Molds - Outdoor avoidance Avoid being outside when the grass is being mowed, or the ground is tilled. Avoid playing in leaves, pine straw, hay, etc.  Dead plant materials contain mold. Avoid going into barns or grain storage areas. Remove leaves, clippings and compost from around the home.  Cockroach Limit spread of food around the house; especially keep food out of bedrooms. Keep food and garbage in closed containers with a  tight lid.  Never leave food out in the kitchen.  Do not leave out pet food or dirty food bowls. Mop the kitchen floor and wash countertops at least once a week. Repair leaky pipes and faucets so there is no standing water to attract roaches. Plug up cracks in the house through which cockroaches can enter. Use bait stations and approved pesticides to reduce cockroach infestation. Pollen Avoidance Pollen levels are highest during the mid-day and afternoon.  Consider this when planning outdoor activities. Avoid being outside when the grass is being mowed, or wear a mask if the pollen-allergic person must be the one to mow the grass. Keep the windows closed to keep pollen outside of the home. Use an air conditioner to filter the air. Take a shower, wash hair, and change clothing after working or playing outdoors during pollen season. Pet Dander Keep the pet out of your bedroom and restrict it to only a few rooms. Be advised that keeping the pet in only one room will not limit the allergens to that room. Don't pet, hug or kiss the pet; if you do, wash your hands with soap and water. High-efficiency particulate air (HEPA) cleaners run continuously in a bedroom or living room can reduce allergen levels over time. Regular use of a high-efficiency vacuum cleaner or a central vacuum can reduce allergen levels. Giving your pet a bath at least once a week can reduce airborne allergen.    Return in about 6 weeks (around 08/31/2023).  Alesia Morin, MD [facility]

## 2023-08-08 DIAGNOSIS — R1030 Lower abdominal pain, unspecified: Secondary | ICD-10-CM | POA: Diagnosis not present

## 2023-08-08 DIAGNOSIS — K2100 Gastro-esophageal reflux disease with esophagitis, without bleeding: Secondary | ICD-10-CM | POA: Diagnosis not present

## 2023-08-08 DIAGNOSIS — K9089 Other intestinal malabsorption: Secondary | ICD-10-CM | POA: Diagnosis not present

## 2023-08-08 DIAGNOSIS — R110 Nausea: Secondary | ICD-10-CM | POA: Diagnosis not present

## 2023-08-10 DIAGNOSIS — G43719 Chronic migraine without aura, intractable, without status migrainosus: Secondary | ICD-10-CM | POA: Diagnosis not present

## 2023-08-30 ENCOUNTER — Other Ambulatory Visit: Payer: Self-pay

## 2023-08-30 ENCOUNTER — Encounter: Payer: Self-pay | Admitting: Internal Medicine

## 2023-08-30 ENCOUNTER — Ambulatory Visit (INDEPENDENT_AMBULATORY_CARE_PROVIDER_SITE_OTHER): Payer: Federal, State, Local not specified - PPO | Admitting: Internal Medicine

## 2023-08-30 VITALS — BP 136/88 | HR 82 | Temp 98.1°F | Wt 210.7 lb

## 2023-08-30 DIAGNOSIS — J4530 Mild persistent asthma, uncomplicated: Secondary | ICD-10-CM

## 2023-08-30 DIAGNOSIS — J302 Other seasonal allergic rhinitis: Secondary | ICD-10-CM | POA: Diagnosis not present

## 2023-08-30 DIAGNOSIS — J3089 Other allergic rhinitis: Secondary | ICD-10-CM | POA: Diagnosis not present

## 2023-08-30 MED ORDER — IPRATROPIUM BROMIDE 0.06 % NA SOLN: 2.0000 | Freq: Three times a day (TID) | NASAL | 5 refills | Status: DC | PRN

## 2023-08-30 MED ORDER — LEVOCETIRIZINE DIHYDROCHLORIDE 5 MG PO TABS: 5.0000 mg | ORAL_TABLET | Freq: Every evening | ORAL | 5 refills | Status: DC

## 2023-08-30 MED ORDER — AZELASTINE HCL 0.1 % NA SOLN: 2.0000 | Freq: Two times a day (BID) | NASAL | 5 refills | Status: DC | PRN

## 2023-08-30 MED ORDER — MONTELUKAST SODIUM 10 MG PO TABS: 10.0000 mg | ORAL_TABLET | Freq: Every day | ORAL | 5 refills | Status: DC

## 2023-08-30 NOTE — Progress Notes (Signed)
FOLLOW UP Date of Service/Encounter:  08/30/23   Subjective:  [name] (DOB: [birth year]) is a 52 y.o. female who [facility] for follow up for asthma and allergic rhinitis.   History obtained from: chart review and patient. Last visit was on 07/20/2023 and at the time, we discussed starting INCS/INAH/OAH/Singulair for allergies.  Also to use Symbicort daily, not PRN.  Since last visit, reports asthma is doing better.  Has noticed reduced wheezing episodes.  She does note coughing but that is related to her allergies as she gets lots of drainage. Sometimes feels so congested, she can't breathe through her nose.  Taking Symbicort 2 puffs daily and Singulair daily. Rarely needing Albuterol now.  No ER visits/oral prednisone since last visit.   Having a lot of trouble with her allergies though.  Notes frequent congestion, drainage, runny nose, sneezing.  Taking intranasal steroid, can't recall of nasonex or nasacort daily.  Also on Azelastine, Xyzal, Claritin, Singulair. Does sinus rinse first.  Even with all of these medications, she has not noticed much improvement.   Past Medical History: Past Medical History:  Diagnosis Date   Allergy    Anxiety    Asthma    Bipolar 1 disorder (HCC)    Chronic lower back pain 02/07/2014   Depression    excessive daytime sleepiness-MSLT confirmed pathological degree of hypersomnia-12/13   Eczema    Esophageal reflux    Gastroparesis    Hypersomnia    Hypertension    Medication monitoring encounter 02/07/2014   Migraine    Migraine    Migraines    Narcolepsy    OCD (obsessive compulsive disorder)    Opiate use 07/02/2015   Osteoarthritis    knees, hands, shoulder, lower back   Other iron deficiency anemias 08/28/2015   Overactive bladder    Psychosis (HCC)    Restless leg syndrome    S/P lumbar fusion 09/06/2009   Schizophrenia (HCC)    Schizophrenia, schizo-affective (HCC)     Seasonal allergies    Sleep apnea    uses CPAP every night   Torn rotator cuff it:2012,rt:2013    Objective:  BP 136/88 (BP Location: Right Arm, Patient Position: Sitting, Cuff Size: Large)   Pulse 82   Temp 98.1 F (36.7 C) (Temporal)   Wt 210 lb 11.2 oz (95.6 kg)   SpO2 96%   BMI 37.32 kg/m  Body mass index is 37.32 kg/m. Physical Exam: GEN: alert, well developed HEENT: clear conjunctiva, nose with moderate inferior turbinate hypertrophy, pink nasal mucosa, +clear rhinorrhea, + cobblestoning HEART: regular rate and rhythm, no murmur LUNGS: clear to auscultation bilaterally, no coughing, unlabored respiration SKIN: no rashes or lesions  Spirometry:  Tracings reviewed. Her effort: It was hard to get consistent efforts and there is a question as to whether this reflects a maximal maneuver. FVC: 2.07L, 75% predicted  FEV1: 1.49L, 67% predicted FEV1/FVC ratio: 72% Interpretation: Spirometry consistent with possible restrictive disease.  Please see scanned spirometry results for details.  Assessment:   1. Seasonal and perennial allergic rhinitis   2. Mild persistent asthma without complication     Plan/Recommendations:  Allergic Rhinitis: - Uncontrolled, discussed  strongly considering AIT.  - SPT 07/2023: trees, grasses, weeds, cats, dogs, cockroach, dust mites, molds  - Use nasal saline rinses before nose sprays such as with Neilmed Sinus Rinse.  Use distilled water.   - Use Nasonex or Nasacort 2 sprays each nostril daily.  Aim  upward and outward.  - Use Azelastine 2 sprays each nostril twice daily. Aim upward and outward. - Use Ipratroprium 1-2 sprays up to three times daily as needed for runny nose/drainage. Aim upward and outward. - Use Xyzal 5mg  and Claritin 10mg  daily.  - Use Singulair 10mg  daily.  Stop if there are any mood/behavioral changes. - Consider allergy shots for long term control. Discuss with insurance company regarding cost estimate and coverage and if  you would like to start them, call us back and we will get the vials mixed.    Mild Persistent Asthma: - Controlled, spirometry today without obstruction.   - Maintenance inhaler: continue Symbicort 160-4.11mcg 2 puffs daily. Continue Singulair 10mg  daily.  If symptoms worsen, increase Symbicort to 2 puffs twice daily.   - Rescue inhaler: Albuterol 2 puffs via spacer or 1 vial via nebulizer every 4-6 hours as needed for respiratory symptoms of cough, shortness of breath, or wheezing Asthma control goals:  Full participation in all desired activities (may need albuterol before activity) Albuterol use two times or less a week on average (not counting use with activity) Cough interfering with sleep two times or less a month Oral steroids no more than once a year No hospitalizations  Return in about 3 months (around 11/28/2023).  Alesia Morin, MD [facility]

## 2023-08-30 NOTE — Patient Instructions (Addendum)
Allergic Rhinitis: - SPT 07/2023: trees, grasses, weeds, cats, dogs, cockroach, dust mites, molds  - Use nasal saline rinses before nose sprays such as with Neilmed Sinus Rinse.  Use distilled water.   - Use Nasonex or Nasacort 2 sprays each nostril daily.  Aim upward and outward.  - Use Azelastine 2 sprays each nostril twice daily. Aim upward and outward. - Use Ipratroprium 1-2 sprays up to three times daily as needed for runny nose/drainage. Aim upward and outward. - Use Xyzal 5mg  and Claritin 10mg  daily.  - Use Singulair 10mg  daily.  Stop if there are any mood/behavioral changes. - Consider allergy shots for long term control. Discuss with insurance company regarding cost estimate and coverage and if you would like to start them, call us back and we will get the vials mixed.    Mild Persistent Asthma: - Maintenance inhaler: continue Symbicort 160-4.55mcg 2 puffs daily. Continue Singulair 10mg  daily.  If symptoms worsen, increase Symbicort to 2 puffs twice daily.   - Rescue inhaler: Albuterol 2 puffs via spacer or 1 vial via nebulizer every 4-6 hours as needed for respiratory symptoms of cough, shortness of breath, or wheezing Asthma control goals:  Full participation in all desired activities (may need albuterol before activity) Albuterol use two times or less a week on average (not counting use with activity) Cough interfering with sleep two times or less a month Oral steroids no more than once a year No hospitalizations   OSA on CPAP - Wear CPAP anytime you sleep.  Use a humidifier.

## 2023-09-15 ENCOUNTER — Other Ambulatory Visit: Payer: Self-pay

## 2023-09-15 DIAGNOSIS — Z131 Encounter for screening for diabetes mellitus: Secondary | ICD-10-CM | POA: Diagnosis not present

## 2023-09-15 DIAGNOSIS — E7800 Pure hypercholesterolemia, unspecified: Secondary | ICD-10-CM | POA: Diagnosis not present

## 2023-09-15 DIAGNOSIS — Z0000 Encounter for general adult medical examination without abnormal findings: Secondary | ICD-10-CM | POA: Diagnosis not present

## 2023-09-15 DIAGNOSIS — I10 Essential (primary) hypertension: Secondary | ICD-10-CM | POA: Diagnosis not present

## 2023-09-15 MED ORDER — EPINEPHRINE 0.3 MG/0.3ML IJ SOAJ: 0.3000 mg | INTRAMUSCULAR | 2 refills | Status: DC | PRN

## 2023-09-15 NOTE — Progress Notes (Unsigned)
 Patient is schedule to start allergy injections. Epipen has been sent in.

## 2023-09-16 ENCOUNTER — Other Ambulatory Visit: Payer: Self-pay | Admitting: Internal Medicine

## 2023-09-16 DIAGNOSIS — J301 Allergic rhinitis due to pollen: Secondary | ICD-10-CM

## 2023-09-16 DIAGNOSIS — J3089 Other allergic rhinitis: Secondary | ICD-10-CM

## 2023-09-16 DIAGNOSIS — J3081 Allergic rhinitis due to animal (cat) (dog) hair and dander: Secondary | ICD-10-CM

## 2023-09-16 NOTE — Progress Notes (Signed)
AIT Rx signed. Epipen sent.

## 2023-09-19 DIAGNOSIS — J3081 Allergic rhinitis due to animal (cat) (dog) hair and dander: Secondary | ICD-10-CM | POA: Diagnosis not present

## 2023-09-19 NOTE — Progress Notes (Signed)
 Aeroallergen Immunotherapy   Ordering Provider: Arleta Blanch   Patient Details  Name: [name]  MRN: [id number]  Date of Birth: 01/19/1971   Order 1 of 2   Vial Label: T,G,W,DN,C,D,CR   0.3 ml (Volume)  BAU Concentration -- 7 Grass Mix* 100,000 ([place]  Blue, [place], Orchard, Perennial Rye, RedTop, Sweet Vernal, Timothy)  0.3 ml (Volume)  1:20 Concentration -- Ragweed Mix  0.2 ml (Volume)  1:20 Concentration -- Cocklebur  0.5 ml (Volume)  1:20 Concentration -- Weed Mix*  0.5 ml (Volume)  1:20 Concentration -- Eastern 10 Tree Mix (also Sweet Gum)  0.2 ml (Volume)  1:10 Concentration -- Cedar, red  0.2 ml (Volume)  1:10 Concentration -- Pecan Pollen  0.2 ml (Volume)  1:10 Concentration -- Pine Mix  0.2 ml (Volume)  1:20 Concentration -- Walnut, Black Pollen  0.5 ml (Volume)  1:10 Concentration -- Cat Hair  0.3 ml (Volume)  1:20 Concentration -- Cockroach, German  0.5 ml (Volume)  1:10 Concentration -- Dog Epithelia  0.5 ml (Volume)   AU Concentration -- Mite Mix (DF 5,000 & DP 5,000)    4.4  ml Extract Subtotal  0.6  ml Diluent   5.0  ml Maintenance Total   Schedule:  C  Silver Vial (1:1,000,000): Schedule C (5 doses)  Blue Vial (1:100,000): Schedule C (5 doses)  Yellow Vial (1:10,000): Schedule C (5 doses)  Green Vial (1:1,000): Schedule C (5 doses)  Red Vial (1:100): Schedule A (10 doses)   Special Instructions: sign consent

## 2023-09-19 NOTE — Progress Notes (Signed)
 Aeroallergen Immunotherapy  Ordering Provider: Arleta Blanch  Patient Details Name: [name] MRN: [id number] Date of Birth: 07-05-1971  Order 2 of 2  Vial Label: molds  0.2 ml (Volume)  1:20 Concentration -- Alternaria alternata 0.2 ml (Volume)  1:20 Concentration -- Cladosporium herbarum 0.2 ml (Volume)  1:10 Concentration -- Aspergillus mix 0.2 ml (Volume)  1:10 Concentration -- Penicillium mix 0.2 ml (Volume)  1:20 Concentration -- Bipolaris sorokiniana 0.2 ml (Volume)  1:20 Concentration -- Drechslera spicifera 0.2 ml (Volume)  1:10 Concentration -- Mucor plumbeus   1.4  ml Extract Subtotal 3.6  ml Diluent  5.0  ml Maintenance Total  Schedule:  C Silver Vial (1:1,000,000): Schedule C (5 doses) Blue Vial (1:100,000): Schedule C (5 doses) Yellow Vial (1:10,000): Schedule C (5 doses) Green Vial (1:1,000): Schedule C (5 doses) Red Vial (1:100): Schedule A (10 doses)  Special Instructions: Sign consent

## 2023-09-19 NOTE — Progress Notes (Signed)
 VIALS EXP 09-19-23

## 2023-09-20 DIAGNOSIS — J302 Other seasonal allergic rhinitis: Secondary | ICD-10-CM | POA: Diagnosis not present

## 2023-10-04 DIAGNOSIS — R42 Dizziness and giddiness: Secondary | ICD-10-CM | POA: Diagnosis not present

## 2023-10-05 DIAGNOSIS — D72829 Elevated white blood cell count, unspecified: Secondary | ICD-10-CM | POA: Diagnosis not present

## 2023-10-05 DIAGNOSIS — Z79899 Other long term (current) drug therapy: Secondary | ICD-10-CM | POA: Diagnosis not present

## 2023-10-05 DIAGNOSIS — R55 Syncope and collapse: Secondary | ICD-10-CM | POA: Diagnosis not present

## 2023-10-05 DIAGNOSIS — R413 Other amnesia: Secondary | ICD-10-CM | POA: Diagnosis not present

## 2023-10-05 DIAGNOSIS — R42 Dizziness and giddiness: Secondary | ICD-10-CM | POA: Diagnosis not present

## 2023-10-06 ENCOUNTER — Ambulatory Visit: Payer: Federal, State, Local not specified - PPO | Admitting: *Deleted

## 2023-10-06 DIAGNOSIS — R413 Other amnesia: Secondary | ICD-10-CM | POA: Diagnosis not present

## 2023-10-06 DIAGNOSIS — R4189 Other symptoms and signs involving cognitive functions and awareness: Secondary | ICD-10-CM | POA: Diagnosis not present

## 2023-10-06 DIAGNOSIS — J309 Allergic rhinitis, unspecified: Secondary | ICD-10-CM

## 2023-10-06 NOTE — Progress Notes (Signed)
Immunotherapy   Patient Details  Name: [name] MRN: [id number] Date of Birth: 1970/10/21  10/06/2023  [name] started injections for  POLLEN-DM-PET-CR, MOLDS Following schedule: C  Frequency:1 time per week Epi-Pen:Epi-Pen Available  Consent signed and patient instructions given. Patient started allergy injections and received .10mL of POLLEN-DM-PET-CR in the RUA and .10mL of MOLDS in the LUA. Patient waited 30 minutes in office and did not experience any issues.   [name] 10/06/2023, 9:07 AM

## 2023-10-07 ENCOUNTER — Other Ambulatory Visit: Payer: Self-pay

## 2023-10-07 MED ORDER — ALBUTEROL SULFATE HFA 108 (90 BASE) MCG/ACT IN AERS: 1.0000 | INHALATION_SPRAY | Freq: Four times a day (QID) | RESPIRATORY_TRACT | 1 refills | Status: DC | PRN

## 2023-10-11 ENCOUNTER — Ambulatory Visit (INDEPENDENT_AMBULATORY_CARE_PROVIDER_SITE_OTHER): Payer: Self-pay

## 2023-10-11 DIAGNOSIS — J309 Allergic rhinitis, unspecified: Secondary | ICD-10-CM | POA: Diagnosis not present

## 2023-10-15 DIAGNOSIS — R4189 Other symptoms and signs involving cognitive functions and awareness: Secondary | ICD-10-CM | POA: Diagnosis not present

## 2023-10-15 DIAGNOSIS — R413 Other amnesia: Secondary | ICD-10-CM | POA: Diagnosis not present

## 2023-10-18 ENCOUNTER — Ambulatory Visit (INDEPENDENT_AMBULATORY_CARE_PROVIDER_SITE_OTHER): Payer: Federal, State, Local not specified - PPO | Admitting: *Deleted

## 2023-10-18 DIAGNOSIS — J309 Allergic rhinitis, unspecified: Secondary | ICD-10-CM

## 2023-10-21 DIAGNOSIS — Z01419 Encounter for gynecological examination (general) (routine) without abnormal findings: Secondary | ICD-10-CM | POA: Diagnosis not present

## 2023-10-24 ENCOUNTER — Ambulatory Visit (INDEPENDENT_AMBULATORY_CARE_PROVIDER_SITE_OTHER): Payer: Self-pay

## 2023-10-24 ENCOUNTER — Encounter: Payer: Self-pay | Admitting: Internal Medicine

## 2023-10-24 DIAGNOSIS — Z1231 Encounter for screening mammogram for malignant neoplasm of breast: Secondary | ICD-10-CM | POA: Diagnosis not present

## 2023-10-24 DIAGNOSIS — J309 Allergic rhinitis, unspecified: Secondary | ICD-10-CM

## 2023-10-24 DIAGNOSIS — M5412 Radiculopathy, cervical region: Secondary | ICD-10-CM | POA: Diagnosis not present

## 2023-10-24 DIAGNOSIS — M5459 Other low back pain: Secondary | ICD-10-CM | POA: Diagnosis not present

## 2023-10-24 DIAGNOSIS — M170 Bilateral primary osteoarthritis of knee: Secondary | ICD-10-CM | POA: Diagnosis not present

## 2023-10-26 DIAGNOSIS — G4733 Obstructive sleep apnea (adult) (pediatric): Secondary | ICD-10-CM | POA: Diagnosis not present

## 2023-10-26 DIAGNOSIS — G43909 Migraine, unspecified, not intractable, without status migrainosus: Secondary | ICD-10-CM | POA: Diagnosis not present

## 2023-10-26 DIAGNOSIS — I10 Essential (primary) hypertension: Secondary | ICD-10-CM | POA: Diagnosis not present

## 2023-10-26 DIAGNOSIS — R000 Tachycardia, unspecified: Secondary | ICD-10-CM | POA: Diagnosis not present

## 2023-10-27 DIAGNOSIS — K122 Cellulitis and abscess of mouth: Secondary | ICD-10-CM | POA: Diagnosis not present

## 2023-10-27 DIAGNOSIS — R52 Pain, unspecified: Secondary | ICD-10-CM | POA: Diagnosis not present

## 2023-10-27 DIAGNOSIS — K0823 Severe atrophy of the mandible: Secondary | ICD-10-CM | POA: Diagnosis not present

## 2023-10-27 DIAGNOSIS — R1310 Dysphagia, unspecified: Secondary | ICD-10-CM | POA: Diagnosis not present

## 2023-11-03 ENCOUNTER — Ambulatory Visit (INDEPENDENT_AMBULATORY_CARE_PROVIDER_SITE_OTHER): Payer: Self-pay

## 2023-11-03 DIAGNOSIS — J309 Allergic rhinitis, unspecified: Secondary | ICD-10-CM

## 2023-11-08 ENCOUNTER — Telehealth: Payer: Self-pay

## 2023-11-08 ENCOUNTER — Ambulatory Visit (INDEPENDENT_AMBULATORY_CARE_PROVIDER_SITE_OTHER): Payer: Self-pay

## 2023-11-08 DIAGNOSIS — J309 Allergic rhinitis, unspecified: Secondary | ICD-10-CM

## 2023-11-08 NOTE — Telephone Encounter (Signed)
 Patient came in to get her allergy injections and asked if we could please send her immunotherapy records to her Primary Care Provider. She was informed that hey do not have any notes regarding her immunotherapy and they are the ones who referred her to our practice.

## 2023-11-17 ENCOUNTER — Ambulatory Visit (INDEPENDENT_AMBULATORY_CARE_PROVIDER_SITE_OTHER)

## 2023-11-17 DIAGNOSIS — J309 Allergic rhinitis, unspecified: Secondary | ICD-10-CM

## 2023-11-22 ENCOUNTER — Ambulatory Visit (INDEPENDENT_AMBULATORY_CARE_PROVIDER_SITE_OTHER): Payer: Self-pay | Admitting: *Deleted

## 2023-11-22 DIAGNOSIS — J309 Allergic rhinitis, unspecified: Secondary | ICD-10-CM | POA: Diagnosis not present

## 2023-11-22 DIAGNOSIS — K219 Gastro-esophageal reflux disease without esophagitis: Secondary | ICD-10-CM | POA: Diagnosis not present

## 2023-11-22 DIAGNOSIS — I10 Essential (primary) hypertension: Secondary | ICD-10-CM | POA: Diagnosis not present

## 2023-11-22 DIAGNOSIS — R1319 Other dysphagia: Secondary | ICD-10-CM | POA: Diagnosis not present

## 2023-11-23 DIAGNOSIS — G43709 Chronic migraine without aura, not intractable, without status migrainosus: Secondary | ICD-10-CM | POA: Diagnosis not present

## 2023-11-23 DIAGNOSIS — R110 Nausea: Secondary | ICD-10-CM | POA: Diagnosis not present

## 2023-11-26 ENCOUNTER — Telehealth: Payer: Self-pay | Admitting: Hematology and Oncology

## 2023-11-26 NOTE — Telephone Encounter (Signed)
 Patient called left vm, checking the status of a referral recieved, return call made to patient no answer. Left message advising we haven't recvd referral, Referral fax number was provided.

## 2023-11-29 ENCOUNTER — Other Ambulatory Visit: Payer: Self-pay

## 2023-11-29 ENCOUNTER — Ambulatory Visit (INDEPENDENT_AMBULATORY_CARE_PROVIDER_SITE_OTHER): Payer: Federal, State, Local not specified - PPO | Admitting: Internal Medicine

## 2023-11-29 ENCOUNTER — Encounter: Payer: Self-pay | Admitting: Internal Medicine

## 2023-11-29 VITALS — BP 128/72 | HR 86 | Temp 97.7°F | Resp 19

## 2023-11-29 DIAGNOSIS — J3089 Other allergic rhinitis: Secondary | ICD-10-CM

## 2023-11-29 DIAGNOSIS — M1711 Unilateral primary osteoarthritis, right knee: Secondary | ICD-10-CM | POA: Diagnosis not present

## 2023-11-29 DIAGNOSIS — J302 Other seasonal allergic rhinitis: Secondary | ICD-10-CM

## 2023-11-29 DIAGNOSIS — J4530 Mild persistent asthma, uncomplicated: Secondary | ICD-10-CM | POA: Diagnosis not present

## 2023-11-29 DIAGNOSIS — M25461 Effusion, right knee: Secondary | ICD-10-CM | POA: Diagnosis not present

## 2023-11-29 MED ORDER — ALBUTEROL SULFATE HFA 108 (90 BASE) MCG/ACT IN AERS
1.0000 | INHALATION_SPRAY | Freq: Four times a day (QID) | RESPIRATORY_TRACT | 1 refills | Status: DC | PRN
Start: 2023-11-29 — End: 2024-06-01

## 2023-11-29 MED ORDER — IPRATROPIUM BROMIDE 0.06 % NA SOLN: 2.0000 | Freq: Three times a day (TID) | NASAL | 5 refills | Status: DC | PRN

## 2023-11-29 MED ORDER — LEVOCETIRIZINE DIHYDROCHLORIDE 5 MG PO TABS: 5.0000 mg | ORAL_TABLET | Freq: Every evening | ORAL | 5 refills | Status: DC

## 2023-11-29 MED ORDER — MONTELUKAST SODIUM 10 MG PO TABS: 10.0000 mg | ORAL_TABLET | Freq: Every day | ORAL | 5 refills | Status: DC

## 2023-11-29 MED ORDER — AZELASTINE HCL 0.1 % NA SOLN: 2.0000 | Freq: Two times a day (BID) | NASAL | 5 refills | Status: DC | PRN

## 2023-11-29 MED ORDER — BUDESONIDE-FORMOTEROL FUMARATE 160-4.5 MCG/ACT IN AERO: INHALATION_SPRAY | RESPIRATORY_TRACT | 5 refills | Status: DC

## 2023-11-29 NOTE — Progress Notes (Signed)
 FOLLOW UP Date of Service/Encounter:  11/29/23   Subjective:  [name] (DOB: Sep 14, 1970) is a 53 y.o. female who [facility] for follow up for asthma and allergic rhinitis.   History obtained from: chart review and patient. Last visit was with me on 08/30/2023 and at the time, asthma was doing well on Symbicort 2 puffs daily, Singulair and allergies were uncontrolled so we discussed AIT as she was on INCS, INAH, Singulair, OAH.   Since last visit, she started AIT 09/2023.  Reports noticing slight improvement with this in terms of mucous production and drainage.  Also notes improved eye symptoms with less itchy watery eyes and feels like she can see clearer.  Still has congestion, runny nose, itching though.  Using INCS daily and Azelastine/Ipratropium once or twice daily. Also taking Xyzal, Claritin, Singulair daily.  No reactions with allergy shots.  Has an Epipen. Coming once weekly   Asthma is doing well.  Not much shortness of breath, wheezing, cough.  Uses Symbicort PRN, rather than daily.  Also on Singulair daily.  No ER/urgent care or oral prednisone use for asthma since last visit.    Past Medical History: Past Medical History:  Diagnosis Date   Allergy    Anxiety    Asthma    Bipolar 1 disorder (HCC)    Chronic lower back pain 02/07/2014   Depression    excessive daytime sleepiness-MSLT confirmed pathological degree of hypersomnia-12/13   Eczema    Esophageal reflux    Gastroparesis    Hypersomnia    Hypertension    Medication monitoring encounter 02/07/2014   Migraine    Migraine    Migraines    Narcolepsy    OCD (obsessive compulsive disorder)    Opiate use 07/02/2015   Osteoarthritis    knees, hands, shoulder, lower back   Other iron deficiency anemias 08/28/2015   Overactive bladder    Psychosis (HCC)    Restless leg syndrome    S/P lumbar fusion 09/06/2009   Schizophrenia (HCC)    Schizophrenia,  schizo-affective (HCC)    Seasonal allergies    Sleep apnea    uses CPAP every night   Torn rotator cuff it:2012,rt:2013    Objective:  BP 128/72 (BP Location: Right Arm, Patient Position: Sitting, Cuff Size: Normal)   Pulse 86   Temp 97.7 F (36.5 C) (Temporal)   Resp 19   SpO2 97%  There is no height or weight on file to calculate BMI. Physical Exam: GEN: alert, well developed HEENT: clear conjunctiva, nose with mild inferior turbinate hypertrophy, pink nasal mucosa, + slight clear rhinorrhea, no cobblestoning HEART: regular rate and rhythm, no murmur LUNGS: clear to auscultation bilaterally, no coughing, unlabored respiration SKIN: no rashes or lesions  Spirometry:  Tracings reviewed. Her effort: Good reproducible efforts. FVC: 2.55L, 92% predicted  FEV1: 1.85L, 84% predicted FEV1/FVC ratio: 73% Interpretation: Spirometry consistent with normal pattern.  Please see scanned spirometry results for details.  Assessment:   1. Seasonal and perennial allergic rhinitis   2. Mild persistent asthma without complication     Plan/Recommendations:  Allergic Rhinitis: - Improved, continue building on AIT.  - SPT 07/2023: trees, grasses, weeds, cats, dogs, cockroach, dust mites, molds  - Use nasal saline rinses before nose sprays such as with Neilmed Sinus Rinse.  Use distilled water.   - Use Nasonex or Nasacort 2 sprays each nostril daily.  Aim upward and outward.  - Use Azelastine 2  sprays each nostril twice daily as needed for congestion, drainage, runny nose. Aim upward and outward. - Use Ipratroprium 1-2 sprays up to three times daily as needed for runny nose/drainage. Aim upward and outward. - Use Xyzal 5mg  and Claritin 10mg  daily.  - Use Singulair 10mg  daily.  Stop if there are any mood/behavioral changes. - Continue allergy shots on schedule.  Keep Epipen.  Initiated 09/2023.   Mild Persistent Asthma: - Well controlled, will switch to ICS/LABA as PRN as she has been doing  that already.  Could even do SMART therapy at next visit. MDI technique discussed.  - Maintenance inhaler: Continue Singulair 10mg  daily.   - With respiratory illness or asthma flare ups, start Symbicort 160-4.51mcg 2 puffs twice daily for 1-2 weeks.   - Rescue inhaler: Albuterol 2 puffs via spacer or 1 vial via nebulizer every 4-6 hours as needed for respiratory symptoms of cough, shortness of breath, or wheezing Asthma control goals:  Full participation in all desired activities (may need albuterol before activity) Albuterol use two times or less a week on average (not counting use with activity) Cough interfering with sleep two times or less a month Oral steroids no more than once a year No hospitalizations    Return in about 6 months (around 05/31/2024).  Alesia Morin, MD [facility]

## 2023-11-29 NOTE — Patient Instructions (Addendum)
 Allergic Rhinitis: - SPT 07/2023: trees, grasses, weeds, cats, dogs, cockroach, dust mites, molds  - Use nasal saline rinses before nose sprays such as with Neilmed Sinus Rinse.  Use distilled water.   - Use Nasonex or Nasacort 2 sprays each nostril daily.  Aim upward and outward.  - Use Azelastine 2 sprays each nostril twice daily as needed for congestion, drainage, runny nose. Aim upward and outward. - Use Ipratroprium 1-2 sprays up to three times daily as needed for runny nose/drainage. Aim upward and outward. - Use Xyzal 5mg  and Claritin 10mg  daily.  - Use Singulair 10mg  daily.  Stop if there are any mood/behavioral changes. - Continue allergy shots on schedule.  Keep Epipen.  Initiated 09/2023.   Mild Persistent Asthma: - Maintenance inhaler: Continue Singulair 10mg  daily.   - With respiratory illness or asthma flare ups, start Symbicort 160-4.4mcg 2 puffs twice daily for 1-2 weeks.   - Rescue inhaler: Albuterol 2 puffs via spacer or 1 vial via nebulizer every 4-6 hours as needed for respiratory symptoms of cough, shortness of breath, or wheezing Asthma control goals:  Full participation in all desired activities (may need albuterol before activity) Albuterol use two times or less a week on average (not counting use with activity) Cough interfering with sleep two times or less a month Oral steroids no more than once a year No hospitalizations

## 2023-12-02 DIAGNOSIS — F332 Major depressive disorder, recurrent severe without psychotic features: Secondary | ICD-10-CM | POA: Diagnosis not present

## 2023-12-02 DIAGNOSIS — F4312 Post-traumatic stress disorder, chronic: Secondary | ICD-10-CM | POA: Diagnosis not present

## 2023-12-02 DIAGNOSIS — F411 Generalized anxiety disorder: Secondary | ICD-10-CM | POA: Diagnosis not present

## 2023-12-06 ENCOUNTER — Ambulatory Visit (INDEPENDENT_AMBULATORY_CARE_PROVIDER_SITE_OTHER): Payer: Self-pay | Admitting: *Deleted

## 2023-12-06 DIAGNOSIS — M25561 Pain in right knee: Secondary | ICD-10-CM | POA: Diagnosis not present

## 2023-12-06 DIAGNOSIS — J309 Allergic rhinitis, unspecified: Secondary | ICD-10-CM

## 2023-12-07 ENCOUNTER — Encounter: Admitting: Hematology and Oncology

## 2023-12-07 ENCOUNTER — Other Ambulatory Visit

## 2023-12-08 DIAGNOSIS — R946 Abnormal results of thyroid function studies: Secondary | ICD-10-CM | POA: Diagnosis not present

## 2023-12-08 DIAGNOSIS — E039 Hypothyroidism, unspecified: Secondary | ICD-10-CM | POA: Diagnosis not present

## 2023-12-08 DIAGNOSIS — D72829 Elevated white blood cell count, unspecified: Secondary | ICD-10-CM | POA: Diagnosis not present

## 2023-12-12 ENCOUNTER — Ambulatory Visit (INDEPENDENT_AMBULATORY_CARE_PROVIDER_SITE_OTHER): Payer: Self-pay

## 2023-12-12 DIAGNOSIS — J309 Allergic rhinitis, unspecified: Secondary | ICD-10-CM

## 2023-12-20 ENCOUNTER — Inpatient Hospital Stay

## 2023-12-20 ENCOUNTER — Inpatient Hospital Stay: Attending: Hematology and Oncology | Admitting: Hematology and Oncology

## 2023-12-20 ENCOUNTER — Ambulatory Visit (INDEPENDENT_AMBULATORY_CARE_PROVIDER_SITE_OTHER): Payer: Self-pay

## 2023-12-20 VITALS — BP 146/99 | HR 101 | Temp 98.4°F | Resp 17 | Wt 201.2 lb

## 2023-12-20 DIAGNOSIS — J309 Allergic rhinitis, unspecified: Secondary | ICD-10-CM

## 2023-12-20 DIAGNOSIS — Z9189 Other specified personal risk factors, not elsewhere classified: Secondary | ICD-10-CM | POA: Insufficient documentation

## 2023-12-20 DIAGNOSIS — R634 Abnormal weight loss: Secondary | ICD-10-CM | POA: Diagnosis not present

## 2023-12-20 DIAGNOSIS — Z803 Family history of malignant neoplasm of breast: Secondary | ICD-10-CM | POA: Diagnosis not present

## 2023-12-20 NOTE — Progress Notes (Signed)
 [facility] CONSULT NOTE  Patient Care Team: Ollen Bowl, MD as PCP - General (Internal Medicine)  CHIEF COMPLAINTS/PURPOSE OF CONSULTATION:  At high risk for Breast cancer  ASSESSMENT & PLAN:   Assessment and Plan Assessment & Plan Increased risk of breast cancer Significant family history and prolonged HRT use elevate her breast cancer risk. Lifetime risk is 15% based on TC model, five-year risk is 3.5% based on Gail's model. - Recommend regular mammograms due to increased risk and B density breasts. -Since her life time risk is less than 20%, there is no def role for MRI screening - Advise tapering off hormone replacement therapy and discuss non-hormonal methods with gynecologist for vasomotor symptoms. OR for HRT use more than 5 yrs is approximately 1.2. - Consider genetic testing for BRCA1 and BRCA2 mutations. Will discuss with genetics team if she qualifies. - Discuss potential future use of tamoxifen after discontinuation of HRT. -Briefly discussed tamoxifen's mechanism of action and risk reduction in patients who are at high risk of BC  She will RTC in 6 months.  HISTORY OF PRESENTING ILLNESS:  [name] 53 y.o. female is here because of increased risk for breast cancer  Discussed the use of AI scribe software for clinical note transcription with the patient, who gave verbal consent to proceed.  History of Present Illness [name] is a 53 year old female who presents for evaluation of her breast cancer risk. She was referred by [facility] mammography for evaluation of her breast cancer risk.  She has a significant family history of breast cancer, including her mother, sister, and three maternal aunts. Her mother was diagnosed in her fifties and her sister in her forties. Her mother is currently 63 years old and had a mastectomy. Her sister, who is now 55, tested negative for BRCA mutations. She has not undergone genetic testing for BRCA1 and  BRCA2.  She had her first menstrual cycle at age 78 and her first child at age 61. She is postmenopausal following a total hysterectomy and bilateral oophorectomy, which occurred in her thirties and forties, respectively. She has been on hormone replacement therapy (HRT) for approximately ten years to manage menopausal symptoms such as hot flashes.  She has a history of two back surgeries and is currently on several medications, including blood pressure medication, hormone replacement therapy, allergy medication, gabapentin, diclofenac, and oxycodone for pain management. She does not take prednisone regularly.  She has experienced unintentional weight loss of about 15 pounds and notes that her left nipple is chronically inverted. She breastfed her children.  Her family history includes a father with prostate cancer and a maternal grandmother who had kidney cancer. She has three sisters, with only the oldest having had breast cancer. She also has a son and a surviving daughter, with another daughter deceased due to suicide.  All other systems were reviewed with the patient and are negative.  MEDICAL HISTORY:  Past Medical History:  Diagnosis Date   Allergy    Anxiety    Asthma    Bipolar 1 disorder (HCC)    Chronic lower back pain 02/07/2014   Depression    excessive daytime sleepiness-MSLT confirmed pathological degree of hypersomnia-12/13   Eczema    Esophageal reflux    Gastroparesis    Hypersomnia    Hypertension    Medication monitoring encounter 02/07/2014   Migraine    Migraine    Migraines    Narcolepsy    OCD (obsessive compulsive disorder)  Opiate use 07/02/2015   Osteoarthritis    knees, hands, shoulder, lower back   Other iron deficiency anemias 08/28/2015   Overactive bladder    Psychosis (HCC)    Restless leg syndrome    S/P lumbar fusion 09/06/2009   Schizophrenia (HCC)    Schizophrenia, schizo-affective (HCC)    Seasonal allergies    Sleep apnea    uses  CPAP every night   Torn rotator cuff it:2012,rt:2013    SURGICAL HISTORY: Past Surgical History:  Procedure Laterality Date   ABDOMINAL HYSTERECTOMY  2001   ANKLE FUSION     right   CHOLECYSTECTOMY N/A 07/20/2013   Procedure: LAPAROSCOPIC CHOLECYSTECTOMY;  Surgeon: Shelly Rubenstein, MD;  Location: [facility] OR;  Service: General;  Laterality: N/A;   COLONOSCOPY WITH PROPOFOL N/A 10/02/2013   Procedure: COLONOSCOPY WITH PROPOFOL;  Surgeon: Charolett Bumpers, MD;  Location: [facility] ENDOSCOPY;  Service: Endoscopy;  Laterality: N/A;   ESOPHAGOGASTRODUODENOSCOPY (EGD) WITH PROPOFOL N/A 10/02/2013   Procedure: ESOPHAGOGASTRODUODENOSCOPY (EGD) WITH PROPOFOL;  Surgeon: Charolett Bumpers, MD;  Location: [facility] ENDOSCOPY;  Service: Endoscopy;  Laterality: N/A;   FLEXIBLE SIGMOIDOSCOPY Left 04/07/2015   Procedure: FLEXIBLE SIGMOIDOSCOPY;  Surgeon: Bernette Redbird, MD;  Location: [facility] ENDOSCOPY;  Service: Endoscopy;  Laterality: Left;   FRACTURE SURGERY     LAPAROSCOPIC SALPINGO OOPHERECTOMY Left 12/05/2013   Procedure: LAPAROSCOPIC SALPINGO OOPHORECTOMY;  Surgeon: Geryl Rankins, MD;  Location: [facility] ORS;  Service: Gynecology;  Laterality: Left;   LUMBAR FUSION  2011   L4-L5   OOPHORECTOMY   2004   right   ROTATOR CUFF REPAIR  2012,2013   torn    SHOULDER SURGERY     left and right , bone spurs, torn muscle   SPINE SURGERY     spurs,ruptured disc bone  2009   TUBAL LIGATION  2004   VESICOVAGINAL FISTULA CLOSURE W/ TAH  2001   WISDOM TOOTH EXTRACTION      SOCIAL HISTORY: Social History   Socioeconomic History   Marital status: Married    Spouse name: Ethelene Browns   Number of children: 3   Years of education: Master's   Highest education level: Not on file  Occupational History   Occupation: unemployed    Associate Professor: UNEMPLOYED  Tobacco Use   Smoking status: Never    Passive exposure: Never   Smokeless tobacco: Never  Vaping Use   Vaping status: Never Used  Substance and Sexual Activity   Alcohol use: No    Drug use: No   Sexual activity: Yes    Birth control/protection: Surgical  Other Topics Concern   Not on file  Social History Narrative    This patient has an RDI of 12 and AHI of 4.8,  titrated to 6 cm water after PSG, she did qualify for MSLT, Epworth 22 today on 6 cm water.      Based on her MSLT  of 3.6 minutes without SREM and Epworth of 22 on CPAP with residual AHI of 1.8, she should be treated  in a trial base for narcolepsy. She is on many REM supressant medications, and may not be able to  express the narcolepsy in an MSLT.   Patient is married Ethelene Browns) and lives at home with her husband and three children.   Patient is currently unemployed.   Patient has a Master's degree.   Patient is right-handed.   Patient drinks two cups of coffee on average but none lately for 6 months, 3 cups of tea daily.  Social Drivers of Corporate investment banker Strain: Not on file  Food Insecurity: Medium Risk (10/05/2023)   Received from [facility]   Hunger Vital Sign    Worried About Running Out of Food in the Last Year: Sometimes true    Ran Out of Food in the Last Year: Sometimes true  Transportation Needs: No Transportation Needs (10/05/2023)   Received from [facility]    In the past 12 months, has lack of reliable transportation kept you from medical appointments, meetings, work or from getting things needed for daily living? : No  Physical Activity: Not on file  Stress: Not on file  Social Connections: Not on file  Intimate Partner Violence: Not on file    FAMILY HISTORY: Family History  Problem Relation Age of Onset   Cancer Mother        breast   Breast cancer Mother        in 53's   Cancer Father        prostate,colon   Hypertension Father    Thyroid disease Father    High Cholesterol Father    Eczema Sister    Asthma Sister    Thyroid disease Sister    Breast cancer Maternal Aunt    Cancer Paternal Aunt        colon   Cancer Paternal Aunt         lung   Cancer Maternal Grandmother        kidney   Asthma Maternal Grandfather    Cancer Paternal Grandmother        colon   Breast cancer Cousin    Narcolepsy Neg Hx     ALLERGIES:  is allergic to tramadol, naproxen sodium, adhesive [tape], aspirin, benadryl [diphenhydramine hcl], codeine, dust mite extract, metoclopramide, mold extract [trichophyton], molds & smuts, pollen extract, prednisone, toradol [ketorolac tromethamine], and zofran [ondansetron].  MEDICATIONS:  Current Outpatient Medications  Medication Sig Dispense Refill   acyclovir ointment (ZOVIRAX) 5 % 1 application every 3 hours Externally as needed for 7 days     albuterol (VENTOLIN HFA) 108 (90 Base) MCG/ACT inhaler Inhale 1-2 puffs into the lungs every 6 (six) hours as needed for wheezing or shortness of breath. 18 g 1   amphetamine-dextroamphetamine (ADDERALL) 10 MG tablet 2 tablets PO in AM and one tab at lunch. 90 tablet 0   atorvastatin (LIPITOR) 40 MG tablet 1 tablet Orally Once a day     azelastine (ASTELIN) 0.1 % nasal spray Place 2 sprays into both nostrils 2 (two) times daily as needed for rhinitis. Use in each nostril as directed 30 mL 5   budesonide-formoterol (SYMBICORT) 160-4.5 MCG/ACT inhaler With respiratory illness or flare ups, start 2 puffs twice daily for 1-2 weeks. 1 each 5   Clobetasol Propionate (CLOBEX) 0.05 % shampoo Apply 1 application topically every Wednesday.      clotrimazole-betamethasone (LOTRISONE) cream 1 application to affected area Externally Twice a day     clotrimazole-betamethasone (LOTRISONE) lotion 1 application to affected area Externally Twice a day as needed     colestipol (COLESTID) 1 g tablet 1 tablets Orally Once a day     cyclobenzaprine (FLEXERIL) 10 MG tablet Oral for 10 Days     cycloSPORINE (RESTASIS) 0.05 % ophthalmic emulsion Place 1 drop into both eyes every 12 (twelve) hours.       dexamethasone (DECADRON) 2 MG tablet 1 tablet Orally every 12 hrs for 5 days  diclofenac (VOLTAREN) 50 MG EC tablet Take 1 tablet (50 mg total) by mouth 2 (two) times daily. 60 tablet 0   dicyclomine (BENTYL) 10 MG capsule Take 10 mg by mouth 4 (four) times daily as needed for spasms.     dronabinol (MARINOL) 5 MG capsule TK 1 C PO BID  3   EPINEPHrine 0.3 mg/0.3 mL IJ SOAJ injection Inject 0.3 mg into the muscle as needed for anaphylaxis. 0.3 mL 2   esomeprazole (NEXIUM) 40 MG capsule Take 40 mg by mouth daily at 12 noon.     estradiol (ESTRACE) 1 MG tablet 1 tablet Orally Once a day     famotidine (PEPCID) 20 MG tablet Take 1 tablet (20 mg total) by mouth 2 (two) times daily. 30 tablet 0   FLUOCINOLONE ACETONIDE SCALP 0.01 % OIL Apply 1 application  topically daily.  0   fluocinonide (LIDEX) 0.05 % cream Apply 1 application topically 3 (three) times a week.      gabapentin (NEURONTIN) 300 MG capsule Take 1 capsule (300 mg total) by mouth 3 (three) times daily. 90 capsule 2   hydrochlorothiazide (HYDRODIURIL) 25 MG tablet Take 25 mg by mouth every morning.      hydrocortisone (PROCTOCORT) 1 % CREA Apply topically.     ibuprofen (ADVIL) 600 MG tablet Take 1 tablet (600 mg total) by mouth every 6 (six) hours as needed. 30 tablet 0   ipratropium (ATROVENT) 0.06 % nasal spray Place 2 sprays into both nostrils 3 (three) times daily as needed for rhinitis. 15 mL 5   levocetirizine (XYZAL) 5 MG tablet Take 1 tablet (5 mg total) by mouth every evening. 30 tablet 5   lidocaine (XYLOCAINE) 2 % jelly 1 application by Other route as needed (rectally).     LORazepam (ATIVAN) 0.5 MG tablet Take 0.5 mg by mouth every 6 (six) hours as needed for anxiety.     loteprednol (LOTEMAX) 0.2 % SUSP Place 1 drop into both eyes daily.     lurasidone (LATUDA) 40 MG TABS tablet Take by mouth.     montelukast (SINGULAIR) 10 MG tablet Take 1 tablet (10 mg total) by mouth daily. 30 tablet 5   MYRBETRIQ 25 MG TB24 tablet TK 1 T PO QD  3   nitroGLYCERIN (NITROGLYN) 2 % ointment Apply 1 inch topically at  bedtime.     NURTEC 75 MG TBDP Take by mouth.     Olopatadine HCl (PATADAY) 0.2 % SOLN Place 1 drop into both eyes 2 (two) times daily as needed (for allergies).     OnabotulinumtoxinA (BOTOX IJ) Inject as directed every 3 (three) months.     Oxycodone HCl 10 MG TABS Take 10 mg by mouth 3 (three) times daily as needed.     pantoprazole (PROTONIX) 40 MG tablet Take 40 mg by mouth daily.     pramipexole (MIRAPEX) 1 MG tablet Take 1 mg by mouth at bedtime.  0   predniSONE (DELTASONE) 20 MG tablet Take 0.5 tablets (10 mg total) by mouth daily. 3 tablet 0   PREMARIN 0.3 MG tablet Take 0.3 mg by mouth daily.     promethazine (PHENERGAN) 25 MG tablet Take 50 mg by mouth every 6 (six) hours as needed for nausea or vomiting.  3   PROMETHEGAN 25 MG suppository   11   RECTIV 0.4 % OINT   0   RELPAX 40 MG tablet Take 40 mg by mouth every 2 (two) hours as needed for migraine or  headache.      saccharomyces boulardii (FLORASTOR) 250 MG capsule Take 1 capsule (250 mg total) by mouth 2 (two) times daily. 60 capsule 1   scopolamine (TRANSDERM-SCOP) 1 MG/3DAYS APPLY 1 PATCH TOPICALLY TO THE SKIN EVERY 72 HOURS Transdermal for 24 Days     sucralfate (CARAFATE) 1 g tablet Take 1 g by mouth.     tiZANidine (ZANAFLEX) 4 MG tablet Take 1 tablet (4 mg total) by mouth every 8 (eight) hours as needed for muscle spasms. 90 tablet 2   topiramate (TOPAMAX) 100 MG tablet Take 100-200 mg by mouth 2 (two) times daily. 1 tab every AM and 2 at night     traZODone (DESYREL) 50 MG tablet Take 50 mg by mouth at bedtime as needed for sleep.     triamcinolone cream (KENALOG) 0.1 % Apply 1 application topically daily as needed (to affected area).      valACYclovir (VALTREX) 1000 MG tablet 1 tablet Orally Once a day     verapamil (VERELAN PM) 240 MG 24 hr capsule Take 240 mg by mouth at bedtime.     Vitamin D, Ergocalciferol, (DRISDOL) 50000 UNITS CAPS capsule Take 50,000 Units by mouth every 7 (seven) days.     No current  facility-administered medications for this visit.     PHYSICAL EXAMINATION: ECOG PERFORMANCE STATUS: 0 - Asymptomatic  Vitals:   12/20/23 1434  BP: (!) 146/99  Pulse: (!) 101  Resp: 17  Temp: 98.4 F (36.9 C)  SpO2: 97%   Filed Weights   12/20/23 1434  Weight: 201 lb 3.2 oz (91.3 kg)    GENERAL:alert, no distress and comfortable Bilateral breast exam, no concerns, no masses or regional adenopathy PSYCH: alert & oriented x 3 with fluent speech NEURO: no focal motor/sensory deficits  LABORATORY DATA:  I have reviewed the data as listed Lab Results  Component Value Date   WBC 13.1 (H) 11/14/2016   HGB 13.5 11/14/2016   HCT 39.4 11/14/2016   MCV 78.0 11/14/2016   PLT 286 11/14/2016     Chemistry      Component Value Date/Time   NA 137 11/14/2016 1542   NA 143 08/28/2015 1557   K 4.7 11/14/2016 1542   CL 99 (L) 11/14/2016 1542   CO2 28 11/14/2016 1542   BUN 8 11/14/2016 1542   BUN 7 08/28/2015 1557   CREATININE 0.79 11/14/2016 1542      Component Value Date/Time   CALCIUM 10.0 11/14/2016 1542   ALKPHOS 61 11/14/2016 1542   AST 21 11/14/2016 1542   ALT 19 11/14/2016 1542   BILITOT 0.7 11/14/2016 1542   BILITOT 0.2 08/28/2015 1557       RADIOGRAPHIC STUDIES: I have personally reviewed the radiological images as listed and agreed with the findings in the report. No results found.  All questions were answered. The patient knows to call the clinic with any problems, questions or concerns. I spent 45 minutes in the care of this patient including H and P, review of records, counseling and coordination of care.     Murleen Arms, MD 12/20/2023 2:49 PM

## 2023-12-21 ENCOUNTER — Other Ambulatory Visit: Payer: Self-pay | Admitting: Hematology and Oncology

## 2023-12-21 DIAGNOSIS — Z9189 Other specified personal risk factors, not elsewhere classified: Secondary | ICD-10-CM

## 2023-12-21 NOTE — Progress Notes (Signed)
 Referral to genetics placed. Pt was agreeable to go if she qualified.

## 2023-12-23 ENCOUNTER — Other Ambulatory Visit: Payer: Self-pay | Admitting: Physician Assistant

## 2023-12-23 DIAGNOSIS — M25561 Pain in right knee: Secondary | ICD-10-CM

## 2023-12-28 ENCOUNTER — Ambulatory Visit (INDEPENDENT_AMBULATORY_CARE_PROVIDER_SITE_OTHER): Payer: Self-pay

## 2023-12-28 DIAGNOSIS — J309 Allergic rhinitis, unspecified: Secondary | ICD-10-CM

## 2023-12-31 ENCOUNTER — Other Ambulatory Visit

## 2023-12-31 ENCOUNTER — Ambulatory Visit
Admission: RE | Admit: 2023-12-31 | Discharge: 2023-12-31 | Disposition: A | Discharge status: Home / Self Care | Source: Ambulatory Visit | Attending: Physician Assistant | Admitting: Physician Assistant

## 2023-12-31 DIAGNOSIS — M25561 Pain in right knee: Secondary | ICD-10-CM

## 2023-12-31 DIAGNOSIS — M25461 Effusion, right knee: Secondary | ICD-10-CM | POA: Diagnosis not present

## 2023-12-31 DIAGNOSIS — M23361 Other meniscus derangements, other lateral meniscus, right knee: Secondary | ICD-10-CM | POA: Diagnosis not present

## 2023-12-31 DIAGNOSIS — M1711 Unilateral primary osteoarthritis, right knee: Secondary | ICD-10-CM | POA: Diagnosis not present

## 2023-12-31 DIAGNOSIS — M7121 Synovial cyst of popliteal space [Baker], right knee: Secondary | ICD-10-CM | POA: Diagnosis not present

## 2024-01-03 DIAGNOSIS — F4312 Post-traumatic stress disorder, chronic: Secondary | ICD-10-CM | POA: Diagnosis not present

## 2024-01-03 DIAGNOSIS — F332 Major depressive disorder, recurrent severe without psychotic features: Secondary | ICD-10-CM | POA: Diagnosis not present

## 2024-01-03 DIAGNOSIS — F411 Generalized anxiety disorder: Secondary | ICD-10-CM | POA: Diagnosis not present

## 2024-01-04 ENCOUNTER — Ambulatory Visit (INDEPENDENT_AMBULATORY_CARE_PROVIDER_SITE_OTHER)

## 2024-01-04 DIAGNOSIS — J309 Allergic rhinitis, unspecified: Secondary | ICD-10-CM | POA: Diagnosis not present

## 2024-01-04 DIAGNOSIS — M1711 Unilateral primary osteoarthritis, right knee: Secondary | ICD-10-CM | POA: Diagnosis not present

## 2024-01-09 DIAGNOSIS — R946 Abnormal results of thyroid function studies: Secondary | ICD-10-CM | POA: Diagnosis not present

## 2024-01-09 DIAGNOSIS — Z8349 Family history of other endocrine, nutritional and metabolic diseases: Secondary | ICD-10-CM | POA: Diagnosis not present

## 2024-01-11 ENCOUNTER — Ambulatory Visit (INDEPENDENT_AMBULATORY_CARE_PROVIDER_SITE_OTHER): Payer: Self-pay

## 2024-01-11 DIAGNOSIS — J309 Allergic rhinitis, unspecified: Secondary | ICD-10-CM

## 2024-01-18 ENCOUNTER — Ambulatory Visit (INDEPENDENT_AMBULATORY_CARE_PROVIDER_SITE_OTHER)

## 2024-01-18 DIAGNOSIS — J309 Allergic rhinitis, unspecified: Secondary | ICD-10-CM

## 2024-01-25 ENCOUNTER — Ambulatory Visit (INDEPENDENT_AMBULATORY_CARE_PROVIDER_SITE_OTHER)

## 2024-01-25 ENCOUNTER — Encounter: Payer: Self-pay | Admitting: Genetic Counselor

## 2024-01-25 DIAGNOSIS — J309 Allergic rhinitis, unspecified: Secondary | ICD-10-CM

## 2024-01-26 ENCOUNTER — Encounter: Payer: Self-pay | Admitting: Genetic Counselor

## 2024-01-26 ENCOUNTER — Other Ambulatory Visit: Payer: Self-pay | Admitting: Genetic Counselor

## 2024-01-26 ENCOUNTER — Inpatient Hospital Stay: Attending: Hematology and Oncology | Admitting: Genetic Counselor

## 2024-01-26 ENCOUNTER — Inpatient Hospital Stay

## 2024-01-26 DIAGNOSIS — Z803 Family history of malignant neoplasm of breast: Secondary | ICD-10-CM | POA: Diagnosis not present

## 2024-01-26 DIAGNOSIS — Z8042 Family history of malignant neoplasm of prostate: Secondary | ICD-10-CM | POA: Diagnosis not present

## 2024-01-26 DIAGNOSIS — Z800 Family history of malignant neoplasm of digestive organs: Secondary | ICD-10-CM

## 2024-01-26 DIAGNOSIS — Z1379 Encounter for other screening for genetic and chromosomal anomalies: Secondary | ICD-10-CM | POA: Diagnosis not present

## 2024-01-26 LAB — GENETIC SCREENING ORDER

## 2024-01-26 NOTE — Progress Notes (Signed)
 REFERRING PROVIDER: Murleen Arms, MD  PRIMARY PROVIDER:  Elester Grim, MD  PRIMARY REASON FOR VISIT:  1. Family history of breast cancer   2. Family history of colon cancer   3. Family history of prostate cancer    HISTORY OF PRESENT ILLNESS:   [name], a 53 y.o. female, was seen for a [facility] cancer genetics consultation at the request of Dr. Arno Bibles due to a family history of cancer.  [name] presents to clinic today to discuss the possibility of a hereditary predisposition to cancer, to discuss genetic testing, and to further clarify her future cancer risks, as well as potential cancer risks for family members.   [name] is a 53 y.o. female with no personal history of cancer.    RISK FACTORS:  Menarche was at age 77.  First live birth at age 17.  OCP use for approximately 14 years.  Ovaries intact: no.  Uterus intact: no.  Menopausal status: postmenopausal, menopause around age 55-38  HRT use: 12 years, she recently stopped (<1 year ago) Colonoscopy: yes; she reports a history of colon polyps. Mammogram within the last year: yes. Number of breast biopsies: 0. Any excessive radiation exposure in the past: no  Past Medical History:  Diagnosis Date   Allergy     Anxiety    Asthma    Bipolar 1 disorder (HCC)    Chronic lower back pain 02/07/2014   Depression    excessive daytime sleepiness-MSLT confirmed pathological degree of hypersomnia-12/13   Eczema    Esophageal reflux    Gastroparesis    Hypersomnia    Hypertension    Medication monitoring encounter 02/07/2014   Migraine    Migraine    Migraines    Narcolepsy    OCD (obsessive compulsive disorder)    Opiate use 07/02/2015   Osteoarthritis    knees, hands, shoulder, lower back   Other iron deficiency anemias 08/28/2015   Overactive bladder    Psychosis (HCC)    Restless leg syndrome    S/P lumbar fusion 09/06/2009   Schizophrenia (HCC)    Schizophrenia, schizo-affective (HCC)    Seasonal  allergies    Sleep apnea    uses CPAP every night   Torn rotator cuff it:2012,rt:2013    Past Surgical History:  Procedure Laterality Date   ABDOMINAL HYSTERECTOMY  2001   ANKLE FUSION     right   CHOLECYSTECTOMY N/A 07/20/2013   Procedure: LAPAROSCOPIC CHOLECYSTECTOMY;  Surgeon: Rogena Class, MD;  Location: [facility] OR;  Service: General;  Laterality: N/A;   COLONOSCOPY WITH PROPOFOL  N/A 10/02/2013   Procedure: COLONOSCOPY WITH PROPOFOL ;  Surgeon: Garrett Kallman, MD;  Location: [facility] ENDOSCOPY;  Service: Endoscopy;  Laterality: N/A;   ESOPHAGOGASTRODUODENOSCOPY (EGD) WITH PROPOFOL  N/A 10/02/2013   Procedure: ESOPHAGOGASTRODUODENOSCOPY (EGD) WITH PROPOFOL ;  Surgeon: Garrett Kallman, MD;  Location: [facility] ENDOSCOPY;  Service: Endoscopy;  Laterality: N/A;   FLEXIBLE SIGMOIDOSCOPY Left 04/07/2015   Procedure: FLEXIBLE SIGMOIDOSCOPY;  Surgeon: Lanita Pitman, MD;  Location: [facility] ENDOSCOPY;  Service: Endoscopy;  Laterality: Left;   FRACTURE SURGERY     LAPAROSCOPIC SALPINGO OOPHERECTOMY Left 12/05/2013   Procedure: LAPAROSCOPIC SALPINGO OOPHORECTOMY;  Surgeon: Johnn Najjar, MD;  Location: [facility] ORS;  Service: Gynecology;  Laterality: Left;   LUMBAR FUSION  2011   L4-L5   OOPHORECTOMY   2004   right   ROTATOR CUFF REPAIR  2012,2013   torn    SHOULDER SURGERY     left and right , bone  spurs, torn muscle   SPINE SURGERY     spurs,ruptured disc bone  2009   TUBAL LIGATION  2004   VESICOVAGINAL FISTULA CLOSURE W/ TAH  2001   WISDOM TOOTH EXTRACTION      Social History   Socioeconomic History   Marital status: Married    Spouse name: Arnetta Lank   Number of children: 3   Years of education: Master's   Highest education level: Not on file  Occupational History   Occupation: unemployed    Associate Professor: UNEMPLOYED  Tobacco Use   Smoking status: Never    Passive exposure: Never   Smokeless tobacco: Never  Vaping Use   Vaping status: Never Used  Substance and Sexual Activity   Alcohol use: No    Drug use: No   Sexual activity: Yes    Birth control/protection: Surgical  Other Topics Concern   Not on file  Social History Narrative    This patient has an RDI of 12 and AHI of 4.8,  titrated to 6 cm water after PSG, she did qualify for MSLT, Epworth 22 today on 6 cm water.      Based on her MSLT  of 3.6 minutes without SREM and Epworth of 22 on CPAP with residual AHI of 1.8, she should be treated  in a trial base for narcolepsy. She is on many REM supressant medications, and may not be able to  express the narcolepsy in an MSLT.   Patient is married Arnetta Lank) and lives at home with her husband and three children.   Patient is currently unemployed.   Patient has a Master's degree.   Patient is right-handed.   Patient drinks two cups of coffee on average but none lately for 6 months, 3 cups of tea daily.   Social Drivers of Corporate investment banker Strain: Not on file  Food Insecurity: Medium Risk (10/05/2023)   Received from [facility]   Hunger Vital Sign    Worried About Running Out of Food in the Last Year: Sometimes true    Ran Out of Food in the Last Year: Sometimes true  Transportation Needs: No Transportation Needs (10/05/2023)   Received from [facility]    In the past 12 months, has lack of reliable transportation kept you from medical appointments, meetings, work or from getting things needed for daily living? : No  Physical Activity: Not on file  Stress: Not on file  Social Connections: Not on file     FAMILY HISTORY:  We obtained a detailed, 4-generation family history.  Significant diagnoses are listed below: Family History  Problem Relation Age of Onset   Breast cancer Mother 76 - 52   Prostate cancer Father 59 - 26       metastatic   Colon cancer Father 33 - 61   Breast cancer Sister 42   Breast cancer Maternal Aunt 30 - 39   Breast cancer Maternal Aunt 48 - 53   Breast cancer Maternal Aunt 48 - 53   Lung cancer Maternal Aunt         smoked   Colon cancer Paternal Aunt 9 - 71   Lung cancer Paternal Aunt        did not smoke   Kidney cancer Maternal Grandmother 68 - 59   Colon cancer Paternal Grandmother 72 - 36   Breast cancer Cousin        3 maternal first cousins   Ovarian cancer Cousin  maternal first cousin   Breast cancer Cousin        paternal first cousin     [name] is unaware of previous family history of genetic testing for hereditary cancer risks. There is no reported Ashkenazi Jewish ancestry.   GENETIC COUNSELING ASSESSMENT: [name] is a 53 y.o. female with a family history of cancer which is somewhat suggestive of a hereditary predisposition to cancer. We, therefore, discussed and recommended the following at today's visit.   DISCUSSION: We discussed that 5 - 10% of cancer is hereditary, with most cases of hereditary breast cancer associated with BRCA1/2.  There are other genes that can be associated with hereditary breast cancer syndromes.  We discussed that testing is beneficial for several reasons, including knowing about other cancer risks, identifying potential screening and risk-reduction options that may be appropriate, and to understanding if other family members could be at risk for cancer and allowing them to undergo genetic testing.  We reviewed the characteristics, features and inheritance patterns of hereditary cancer syndromes. We also discussed genetic testing, including the appropriate family members to test, the process of testing, insurance coverage and turn-around-time for results. We discussed the implications of a negative, positive, carrier and/or variant of uncertain significant result. We discussed that negative results would be uninformative given that [name] does not have a personal history of cancer. We recommended [name] pursue genetic testing for a panel that contains genes associated with breast, colon, ovarian, prostate, and kidney cancer.  Ms. Yerian was offered  a common hereditary cancer panel (40 genes+kidney cancer genes) and an expanded pan-cancer panel (77 genes). [name] was informed of the benefits and limitations of each panel, including that expanded pan-cancer panels contain several genes that do not have clear management guidelines at this point in time.  We also discussed that as the number of genes included on a panel increases, the chances of variants of uncertain significance increases.  After considering the benefits and limitations of each gene panel, [name] elected to have [facility] CancerNext-Expanded Panel.  The CancerNext-Expanded gene panel offered by [facility] [place] [facility] [place] [facility] and includes sequencing, rearrangement, and RNA analysis for the following 77 genes: AIP, ALK, APC, ATM, AXIN2, BAP1, BARD1, BMPR1A, BRCA1, BRCA2, BRIP1, CDC73, CDH1, CDK4, CDKN1B, CDKN2A, CEBPA, CHEK2, CTNNA1, DDX41, DICER1, ETV6, FH, FLCN, GATA2, LZTR1, MAX, MBD4, MEN1, MET, MLH1, MSH2, MSH3, MSH6, MUTYH, NF1, NF2, NTHL1, PALB2, PHOX2B, PMS2, POT1, PRKAR1A, PTCH1, PTEN, RAD51C, RAD51D, RB1, RET, RPS20, RUNX1, SDHA, SDHAF2, SDHB, SDHC, SDHD, SMAD4, SMARCA4, SMARCB1, SMARCE1, STK11, SUFU, TMEM127, TP53, TSC1, TSC2, VHL, and WT1 (sequencing and deletion/duplication); EGFR, HOXB13, KIT, MITF, PDGFRA, POLD1, and POLE (sequencing only); EPCAM and GREM1 (deletion/duplication only).     Based on [name] family history of cancer, she meets medical criteria for genetic testing. Though [name] is not personally affected, there are no affected family members that are willing/able to undergo hereditary cancer testing. Despite that she meets criteria, she may still have an out of pocket cost. We discussed that if her out of pocket cost for testing is over $100, the laboratory should contact them to discuss self-pay prices, patient pay assistance programs, if applicable, and other billing options.  We discussed that some people do not want to undergo genetic testing due to fear of genetic  discrimination.  A federal law called the Genetic Information Non-Discrimination Act (GINA) of 2008 helps protect individuals against genetic discrimination based on their genetic test results.  It impacts both health insurance and  employment.  With health insurance, it protects against increased premiums, being kicked off insurance or being forced to take a test in order to be insured.  For employment it protects against hiring, firing and promoting decisions based on genetic test results.  GINA does not apply to those in the [employer], those who work for companies with less than 15 employees, and new life insurance or long-term disability insurance policies.  Health status due to a cancer diagnosis is not protected under GINA.  PLAN: After considering the risks, benefits, and limitations, [name] provided informed consent to pursue genetic testing and the blood sample was sent to [facility] for analysis of the CancerNext-Expanded Panel. Results should be available within approximately 2-3 weeks' time, at which point they will be disclosed by telephone to [name], as will any additional recommendations warranted by these results. [name] will receive a summary of her genetic counseling visit and a copy of her results once available. This information will also be available in Epic.   [name] questions were answered to her satisfaction today. Our contact information was provided should additional questions or concerns arise. Thank you for the referral and allowing us  to share in the care of your patient.   Jyaire Koudelka, MS, [facility] Genetic Counselor [email or web address] (P) [phone]  60 minutes were spent on the date of the encounter in service to the patient including preparation, face-to-face consultation, documentation and care coordination. The patient was seen alone.  Drs. Gudena and/or Maryalice Smaller were available to discuss this case as needed.   _______________________________________________________________________ For Office Staff:  Number of people involved in session: 1 Was an Intern/ student involved with case: no

## 2024-02-01 ENCOUNTER — Ambulatory Visit (INDEPENDENT_AMBULATORY_CARE_PROVIDER_SITE_OTHER): Payer: Self-pay

## 2024-02-01 DIAGNOSIS — J309 Allergic rhinitis, unspecified: Secondary | ICD-10-CM | POA: Diagnosis not present

## 2024-02-02 ENCOUNTER — Telehealth: Payer: Self-pay | Admitting: Hematology and Oncology

## 2024-02-02 NOTE — Telephone Encounter (Signed)
 I contacted [name] per a new referral received. I informed [name] that she is currently established with Dr. Arno Bibles. [name] scheduled for next week for her new diagnosis.

## 2024-02-06 ENCOUNTER — Telehealth: Payer: Self-pay

## 2024-02-06 NOTE — Telephone Encounter (Signed)
 Verbally confirmed appt for 6/3

## 2024-02-07 ENCOUNTER — Inpatient Hospital Stay: Attending: Hematology and Oncology | Admitting: Hematology and Oncology

## 2024-02-07 ENCOUNTER — Ambulatory Visit (INDEPENDENT_AMBULATORY_CARE_PROVIDER_SITE_OTHER)

## 2024-02-07 ENCOUNTER — Ambulatory Visit

## 2024-02-07 ENCOUNTER — Encounter: Payer: Self-pay | Admitting: Hematology and Oncology

## 2024-02-07 ENCOUNTER — Inpatient Hospital Stay

## 2024-02-07 VITALS — BP 148/88 | HR 88 | Temp 98.4°F | Resp 18 | Wt 199.2 lb

## 2024-02-07 DIAGNOSIS — D72829 Elevated white blood cell count, unspecified: Secondary | ICD-10-CM | POA: Insufficient documentation

## 2024-02-07 DIAGNOSIS — D72825 Bandemia: Secondary | ICD-10-CM | POA: Diagnosis not present

## 2024-02-07 DIAGNOSIS — Z803 Family history of malignant neoplasm of breast: Secondary | ICD-10-CM | POA: Insufficient documentation

## 2024-02-07 DIAGNOSIS — Z79890 Hormone replacement therapy (postmenopausal): Secondary | ICD-10-CM | POA: Insufficient documentation

## 2024-02-07 DIAGNOSIS — J309 Allergic rhinitis, unspecified: Secondary | ICD-10-CM | POA: Diagnosis not present

## 2024-02-07 DIAGNOSIS — Z9189 Other specified personal risk factors, not elsewhere classified: Secondary | ICD-10-CM | POA: Diagnosis not present

## 2024-02-07 LAB — CBC WITH DIFFERENTIAL/PLATELET
Abs Immature Granulocytes: 0.02 10*3/uL (ref 0.00–0.07)
Basophils Absolute: 0.1 10*3/uL (ref 0.0–0.1)
Basophils Relative: 1 %
Eosinophils Absolute: 0.6 10*3/uL — ABNORMAL HIGH (ref 0.0–0.5)
Eosinophils Relative: 8 %
HCT: 38.3 % (ref 36.0–46.0)
Hemoglobin: 13.7 g/dL (ref 12.0–15.0)
Immature Granulocytes: 0 %
Lymphocytes Relative: 37 %
Lymphs Abs: 3.1 10*3/uL (ref 0.7–4.0)
MCH: 28.2 pg (ref 26.0–34.0)
MCHC: 35.8 g/dL (ref 30.0–36.0)
MCV: 78.8 fL — ABNORMAL LOW (ref 80.0–100.0)
Monocytes Absolute: 0.4 10*3/uL (ref 0.1–1.0)
Monocytes Relative: 5 %
Neutro Abs: 4.1 10*3/uL (ref 1.7–7.7)
Neutrophils Relative %: 49 %
Platelets: 320 10*3/uL (ref 150–400)
RBC: 4.86 MIL/uL (ref 3.87–5.11)
RDW: 13.5 % (ref 11.5–15.5)
WBC: 8.3 10*3/uL (ref 4.0–10.5)
nRBC: 0 % (ref 0.0–0.2)

## 2024-02-07 NOTE — Progress Notes (Signed)
 [facility] CONSULT NOTE  Patient Care Team: Elester Grim, MD as PCP - General (Internal Medicine)  CHIEF COMPLAINTS/PURPOSE OF CONSULTATION:  At high risk for Breast cancer  ASSESSMENT & PLAN:   Assessment and Plan Assessment & Plan She was previously seen for increased risk of BC, recommendation was to continue SBE and annual mammogram. She was also on HRT, we recommended this be tapered. She is here for follow up because of leukocytosis.  We have discussed about common causes of leukocytosis steroid use, cigarette smoking, stress, and occasionally abdominal disorders and other genetic/inherited disorders. I have recommended a repeat CBC, repeat CBC today showed normalization of the white blood cell count.  I do not believe she requires any additional investigation at this time.  Will see her as scheduled  Murleen Arms MD   HISTORY OF PRESENTING ILLNESS:  [name] 53 y.o. female is here because of increased risk for breast cancer  Discussed the use of AI scribe software for clinical note transcription with the patient, who gave verbal consent to proceed.  History of Present Illness [name] is a 53 year old female who presents for evaluation of her breast cancer risk. She was referred by [facility] for evaluation of her breast cancer risk.  She has a significant family history of breast cancer, including her mother, sister, and three maternal aunts. Her mother was diagnosed in her fifties and her sister in her forties. Her mother is currently 13 years old and had a mastectomy. Her sister, who is now 63, tested negative for BRCA mutations. She has not undergone genetic testing for BRCA1 and BRCA2.  Her family history includes a father with prostate cancer and a maternal grandmother who had kidney cancer. She has three sisters, with only the oldest having had breast cancer. She also has a son and a surviving daughter, with another  daughter deceased due to suicide.  Interval History  Patient is here for follow-up because of new onset leukocytosis which was diagnosed by previously seen her history of breast cancer.  Besides the latter finding, she denies any new breast, recurrent infections, steroid use, no autoimmune diseases.  She feels fatigued but otherwise has no new complaints.  She is now off of hormone replacement therapy and has noticed a bit more hot flashes. All other systems were reviewed with the patient and are negative.  MEDICAL HISTORY:  Past Medical History:  Diagnosis Date   Allergy     Anxiety    Asthma    Bipolar 1 disorder (HCC)    Chronic lower back pain 02/07/2014   Depression    excessive daytime sleepiness-MSLT confirmed pathological degree of hypersomnia-12/13   Eczema    Esophageal reflux    Gastroparesis    Hypersomnia    Hypertension    Medication monitoring encounter 02/07/2014   Migraine    Migraine    Migraines    Narcolepsy    OCD (obsessive compulsive disorder)    Opiate use 07/02/2015   Osteoarthritis    knees, hands, shoulder, lower back   Other iron deficiency anemias 08/28/2015   Overactive bladder    Psychosis (HCC)    Restless leg syndrome    S/P lumbar fusion 09/06/2009   Schizophrenia (HCC)    Schizophrenia, schizo-affective (HCC)    Seasonal allergies    Sleep apnea    uses CPAP every night   Torn rotator cuff it:2012,rt:2013    SURGICAL HISTORY: Past Surgical History:  Procedure Laterality Date  ABDOMINAL HYSTERECTOMY  2001   ANKLE FUSION     right   CHOLECYSTECTOMY N/A 07/20/2013   Procedure: LAPAROSCOPIC CHOLECYSTECTOMY;  Surgeon: Rogena Class, MD;  Location: [facility] OR;  Service: General;  Laterality: N/A;   COLONOSCOPY WITH PROPOFOL  N/A 10/02/2013   Procedure: COLONOSCOPY WITH PROPOFOL ;  Surgeon: Garrett Kallman, MD;  Location: [facility] ENDOSCOPY;  Service: Endoscopy;  Laterality: N/A;   ESOPHAGOGASTRODUODENOSCOPY (EGD) WITH PROPOFOL  N/A 10/02/2013    Procedure: ESOPHAGOGASTRODUODENOSCOPY (EGD) WITH PROPOFOL ;  Surgeon: Garrett Kallman, MD;  Location: [facility] ENDOSCOPY;  Service: Endoscopy;  Laterality: N/A;   FLEXIBLE SIGMOIDOSCOPY Left 04/07/2015   Procedure: FLEXIBLE SIGMOIDOSCOPY;  Surgeon: Lanita Pitman, MD;  Location: [facility] ENDOSCOPY;  Service: Endoscopy;  Laterality: Left;   FRACTURE SURGERY     LAPAROSCOPIC SALPINGO OOPHERECTOMY Left 12/05/2013   Procedure: LAPAROSCOPIC SALPINGO OOPHORECTOMY;  Surgeon: Johnn Najjar, MD;  Location: [facility] ORS;  Service: Gynecology;  Laterality: Left;   LUMBAR FUSION  2011   L4-L5   OOPHORECTOMY   2004   right   ROTATOR CUFF REPAIR  2012,2013   torn    SHOULDER SURGERY     left and right , bone spurs, torn muscle   SPINE SURGERY     spurs,ruptured disc bone  2009   TUBAL LIGATION  2004   VESICOVAGINAL FISTULA CLOSURE W/ TAH  2001   WISDOM TOOTH EXTRACTION      SOCIAL HISTORY: Social History   Socioeconomic History   Marital status: Married    Spouse name: Arnetta Lank   Number of children: 3   Years of education: Master's   Highest education level: Not on file  Occupational History   Occupation: unemployed    Associate Professor: UNEMPLOYED  Tobacco Use   Smoking status: Never    Passive exposure: Never   Smokeless tobacco: Never  Vaping Use   Vaping status: Never Used  Substance and Sexual Activity   Alcohol use: No   Drug use: No   Sexual activity: Yes    Birth control/protection: Surgical  Other Topics Concern   Not on file  Social History Narrative    This patient has an RDI of 12 and AHI of 4.8,  titrated to 6 cm water after PSG, she did qualify for MSLT, Epworth 22 today on 6 cm water.      Based on her MSLT  of 3.6 minutes without SREM and Epworth of 22 on CPAP with residual AHI of 1.8, she should be treated  in a trial base for narcolepsy. She is on many REM supressant medications, and may not be able to  express the narcolepsy in an MSLT.   Patient is married Arnetta Lank) and lives at home with  her husband and three children.   Patient is currently unemployed.   Patient has a Master's degree.   Patient is right-handed.   Patient drinks two cups of coffee on average but none lately for 6 months, 3 cups of tea daily.   Social Drivers of Corporate investment banker Strain: Not on file  Food Insecurity: Medium Risk (10/05/2023)   Received from [facility]   Hunger Vital Sign    Worried About Running Out of Food in the Last Year: Sometimes true    Ran Out of Food in the Last Year: Sometimes true  Transportation Needs: No Transportation Needs (10/05/2023)   Received from [facility]    In the past 12 months, has lack of reliable transportation kept you from medical  appointments, meetings, work or from getting things needed for daily living? : No  Physical Activity: Not on file  Stress: Not on file  Social Connections: Not on file  Intimate Partner Violence: Not on file    FAMILY HISTORY: Family History  Problem Relation Age of Onset   Breast cancer Mother 74 - 78   Prostate cancer Father 61 - 66       metastatic   Hypertension Father    Thyroid  disease Father    High Cholesterol Father    Colon cancer Father 58 - 55   Eczema Sister    Asthma Sister    Thyroid  disease Sister    Breast cancer Sister 77   Breast cancer Maternal Aunt 30 - 39   Breast cancer Maternal Aunt 40 - 53   Breast cancer Maternal Aunt 58 - 53   Lung cancer Maternal Aunt        smoked   Colon cancer Paternal Aunt 33 - 78   Lung cancer Paternal Aunt        did not smoke   Kidney cancer Maternal Grandmother 65 - 49   Asthma Maternal Grandfather    Colon cancer Paternal Grandmother 75 - 10   Breast cancer Cousin        3 maternal first cousins   Ovarian cancer Cousin        maternal first cousin   Breast cancer Cousin        paternal first cousin   Narcolepsy Neg Hx     ALLERGIES:  is allergic to tramadol , naproxen sodium, adhesive [tape], aspirin, benadryl  [diphenhydramine hcl], codeine, dust mite extract, metoclopramide , mold extract [trichophyton], molds & smuts, pollen extract, prednisone , toradol  [ketorolac  tromethamine ], and zofran  [ondansetron ].  MEDICATIONS:  Current Outpatient Medications  Medication Sig Dispense Refill   acyclovir ointment (ZOVIRAX) 5 % 1 application every 3 hours Externally as needed for 7 days     albuterol  (VENTOLIN  HFA) 108 (90 Base) MCG/ACT inhaler Inhale 1-2 puffs into the lungs every 6 (six) hours as needed for wheezing or shortness of breath. 18 g 1   amphetamine -dextroamphetamine  (ADDERALL) 10 MG tablet 2 tablets PO in AM and one tab at lunch. 90 tablet 0   atorvastatin (LIPITOR) 40 MG tablet 1 tablet Orally Once a day     azelastine  (ASTELIN ) 0.1 % nasal spray Place 2 sprays into both nostrils 2 (two) times daily as needed for rhinitis. Use in each nostril as directed 30 mL 5   budesonide -formoterol  (SYMBICORT ) 160-4.5 MCG/ACT inhaler With respiratory illness or flare ups, start 2 puffs twice daily for 1-2 weeks. 1 each 5   Clobetasol  Propionate (CLOBEX ) 0.05 % shampoo Apply 1 application topically every Wednesday.      clotrimazole-betamethasone (LOTRISONE) cream 1 application to affected area Externally Twice a day     clotrimazole-betamethasone (LOTRISONE) lotion 1 application to affected area Externally Twice a day as needed     colestipol (COLESTID) 1 g tablet 1 tablets Orally Once a day     cyclobenzaprine (FLEXERIL) 10 MG tablet Oral for 10 Days     cycloSPORINE  (RESTASIS ) 0.05 % ophthalmic emulsion Place 1 drop into both eyes every 12 (twelve) hours.       dexamethasone  (DECADRON ) 2 MG tablet 1 tablet Orally every 12 hrs for 5 days     diclofenac  (VOLTAREN ) 50 MG EC tablet Take 1 tablet (50 mg total) by mouth 2 (two) times daily. 60 tablet 0   dicyclomine  (BENTYL ) 10 MG  capsule Take 10 mg by mouth 4 (four) times daily as needed for spasms.     dronabinol  (MARINOL ) 5 MG capsule TK 1 C PO BID  3    EPINEPHrine  0.3 mg/0.3 mL IJ SOAJ injection Inject 0.3 mg into the muscle as needed for anaphylaxis. 0.3 mL 2   esomeprazole (NEXIUM) 40 MG capsule Take 40 mg by mouth daily at 12 noon.     estradiol  (ESTRACE ) 1 MG tablet 1 tablet Orally Once a day     famotidine  (PEPCID ) 20 MG tablet Take 1 tablet (20 mg total) by mouth 2 (two) times daily. 30 tablet 0   FLUOCINOLONE  ACETONIDE SCALP 0.01 % OIL Apply 1 application  topically daily.  0   fluocinonide (LIDEX) 0.05 % cream Apply 1 application topically 3 (three) times a week.      gabapentin  (NEURONTIN ) 300 MG capsule Take 1 capsule (300 mg total) by mouth 3 (three) times daily. 90 capsule 2   hydrochlorothiazide  (HYDRODIURIL ) 25 MG tablet Take 25 mg by mouth every morning.      hydrocortisone (PROCTOCORT) 1 % CREA Apply topically.     ibuprofen  (ADVIL ) 600 MG tablet Take 1 tablet (600 mg total) by mouth every 6 (six) hours as needed. 30 tablet 0   ipratropium (ATROVENT ) 0.06 % nasal spray Place 2 sprays into both nostrils 3 (three) times daily as needed for rhinitis. 15 mL 5   levocetirizine (XYZAL ) 5 MG tablet Take 1 tablet (5 mg total) by mouth every evening. 30 tablet 5   lidocaine  (XYLOCAINE ) 2 % jelly 1 application by Other route as needed (rectally).     LORazepam  (ATIVAN ) 0.5 MG tablet Take 0.5 mg by mouth every 6 (six) hours as needed for anxiety.     loteprednol  (LOTEMAX ) 0.2 % SUSP Place 1 drop into both eyes daily.     lurasidone  (LATUDA ) 40 MG TABS tablet Take by mouth.     montelukast  (SINGULAIR ) 10 MG tablet Take 1 tablet (10 mg total) by mouth daily. 30 tablet 5   MYRBETRIQ 25 MG TB24 tablet TK 1 T PO QD  3   nitroGLYCERIN (NITROGLYN) 2 % ointment Apply 1 inch topically at bedtime.     NURTEC 75 MG TBDP Take by mouth.     Olopatadine HCl (PATADAY) 0.2 % SOLN Place 1 drop into both eyes 2 (two) times daily as needed (for allergies).     OnabotulinumtoxinA (BOTOX IJ) Inject as directed every 3 (three) months.     Oxycodone  HCl 10 MG  TABS Take 10 mg by mouth 3 (three) times daily as needed.     pantoprazole  (PROTONIX ) 40 MG tablet Take 40 mg by mouth daily.     pramipexole  (MIRAPEX ) 1 MG tablet Take 1 mg by mouth at bedtime.  0   predniSONE  (DELTASONE ) 20 MG tablet Take 0.5 tablets (10 mg total) by mouth daily. 3 tablet 0   PREMARIN  0.3 MG tablet Take 0.3 mg by mouth daily.     promethazine  (PHENERGAN ) 25 MG tablet Take 50 mg by mouth every 6 (six) hours as needed for nausea or vomiting.  3   PROMETHEGAN 25 MG suppository   11   RECTIV 0.4 % OINT   0   RELPAX  40 MG tablet Take 40 mg by mouth every 2 (two) hours as needed for migraine or headache.      saccharomyces boulardii (FLORASTOR) 250 MG capsule Take 1 capsule (250 mg total) by mouth 2 (two) times daily. 60 capsule  1   scopolamine (TRANSDERM-SCOP) 1 MG/3DAYS APPLY 1 PATCH TOPICALLY TO THE SKIN EVERY 72 HOURS Transdermal for 24 Days     sucralfate (CARAFATE) 1 g tablet Take 1 g by mouth.     tiZANidine  (ZANAFLEX ) 4 MG tablet Take 1 tablet (4 mg total) by mouth every 8 (eight) hours as needed for muscle spasms. 90 tablet 2   topiramate  (TOPAMAX ) 100 MG tablet Take 100-200 mg by mouth 2 (two) times daily. 1 tab every AM and 2 at night     traZODone (DESYREL) 50 MG tablet Take 50 mg by mouth at bedtime as needed for sleep.     triamcinolone  cream (KENALOG) 0.1 % Apply 1 application topically daily as needed (to affected area).      valACYclovir (VALTREX) 1000 MG tablet 1 tablet Orally Once a day     verapamil  (VERELAN  PM) 240 MG 24 hr capsule Take 240 mg by mouth at bedtime.     Vitamin D , Ergocalciferol , (DRISDOL) 50000 UNITS CAPS capsule Take 50,000 Units by mouth every 7 (seven) days.     No current facility-administered medications for this visit.     PHYSICAL EXAMINATION: ECOG PERFORMANCE STATUS: 0 - Asymptomatic  Vitals:   02/07/24 1452  BP: (!) 148/88  Pulse: 88  Resp: 18  Temp: 98.4 F (36.9 C)  SpO2: 99%   Filed Weights   02/07/24 1452  Weight:  199 lb 3.2 oz (90.4 kg)    GENERAL:alert, no distress and comfortable Bilateral breast exam, no concerns, no masses or regional adenopathy PSYCH: alert & oriented x 3 with fluent speech NEURO: no focal motor/sensory deficits  LABORATORY DATA:  I have reviewed the data as listed Lab Results  Component Value Date   WBC 13.1 (H) 11/14/2016   HGB 13.5 11/14/2016   HCT 39.4 11/14/2016   MCV 78.0 11/14/2016   PLT 286 11/14/2016     Chemistry      Component Value Date/Time   NA 137 11/14/2016 1542   NA 143 08/28/2015 1557   K 4.7 11/14/2016 1542   CL 99 (L) 11/14/2016 1542   CO2 28 11/14/2016 1542   BUN 8 11/14/2016 1542   BUN 7 08/28/2015 1557   CREATININE 0.79 11/14/2016 1542      Component Value Date/Time   CALCIUM 10.0 11/14/2016 1542   ALKPHOS 61 11/14/2016 1542   AST 21 11/14/2016 1542   ALT 19 11/14/2016 1542   BILITOT 0.7 11/14/2016 1542   BILITOT 0.2 08/28/2015 1557       RADIOGRAPHIC STUDIES: I have personally reviewed the radiological images as listed and agreed with the findings in the report. No results found.  All questions were answered. The patient knows to call the clinic with any problems, questions or concerns. I spent 20 minutes in the care of this patient including H and P, review of records, counseling and coordination of care.     Murleen Arms, MD 02/07/2024 2:55 PM

## 2024-02-09 DIAGNOSIS — F332 Major depressive disorder, recurrent severe without psychotic features: Secondary | ICD-10-CM | POA: Diagnosis not present

## 2024-02-09 DIAGNOSIS — F411 Generalized anxiety disorder: Secondary | ICD-10-CM | POA: Diagnosis not present

## 2024-02-09 DIAGNOSIS — F4312 Post-traumatic stress disorder, chronic: Secondary | ICD-10-CM | POA: Diagnosis not present

## 2024-02-10 ENCOUNTER — Encounter: Payer: Self-pay | Admitting: Genetic Counselor

## 2024-02-10 ENCOUNTER — Ambulatory Visit: Payer: Self-pay | Admitting: Genetic Counselor

## 2024-02-10 ENCOUNTER — Telehealth: Payer: Self-pay | Admitting: Genetic Counselor

## 2024-02-10 DIAGNOSIS — Z1379 Encounter for other screening for genetic and chromosomal anomalies: Secondary | ICD-10-CM | POA: Insufficient documentation

## 2024-02-10 NOTE — Telephone Encounter (Addendum)
I contacted Ms. Bendall to discuss her genetic testing results. No pathogenic variants were identified in the 77 genes analyzed. Detailed clinic note to follow.  The test report has been scanned into EPIC and is located under the Molecular Pathology section of the Results Review tab.  A portion of the result report is included below for reference.   Arless Vineyard Sylvio Weatherall, MS, LCGC Genetic Counselor [email or web address] (P) [phone]   

## 2024-02-10 NOTE — Progress Notes (Signed)
 HPI:   [name] was previously seen in the [facility] due to a family history of cancer and concerns regarding a hereditary predisposition to cancer. Please refer to our prior cancer genetics clinic note for more information regarding our discussion, assessment and recommendations, at the time. [name] recent genetic test results were disclosed to her, as were recommendations warranted by these results. These results and recommendations are discussed in more detail below.  CANCER HISTORY:  Oncology History   No history exists.    FAMILY HISTORY:  We obtained a detailed, 4-generation family history.  Significant diagnoses are listed below:      Family History  Problem Relation Age of Onset   Breast cancer Mother 52 - 23   Prostate cancer Father 7 - 26        metastatic   Colon cancer Father 84 - 51   Breast cancer Sister 64   Breast cancer Maternal Aunt 30 - 39   Breast cancer Maternal Aunt 48 - 53   Breast cancer Maternal Aunt 48 - 53   Lung cancer Maternal Aunt          smoked   Colon cancer Paternal Aunt 36 - 4   Lung cancer Paternal Aunt          did not smoke   Kidney cancer Maternal Grandmother 6 - 59   Colon cancer Paternal Grandmother 35 - 20   Breast cancer Cousin          3 maternal first cousins   Ovarian cancer Cousin          maternal first cousin   Breast cancer Cousin          paternal first cousin       [name] is unaware of previous family history of genetic testing for hereditary cancer risks. There is no reported Ashkenazi Jewish ancestry.   GENETIC TEST RESULTS:  The [facility] CancerNext-Expanded Panel found no pathogenic mutations.   The CancerNext-Expanded gene panel offered by [facility] and includes sequencing, rearrangement, and RNA analysis for the following 77 genes: AIP, ALK, APC, ATM, AXIN2, BAP1, BARD1, BMPR1A, BRCA1, BRCA2, BRIP1, CDC73, CDH1, CDK4, CDKN1B, CDKN2A, CEBPA, CHEK2, CTNNA1, DDX41, DICER1, ETV6, FH,  FLCN, GATA2, LZTR1, MAX, MBD4, MEN1, MET, MLH1, MSH2, MSH3, MSH6, MUTYH, NF1, NF2, NTHL1, PALB2, PHOX2B, PMS2, POT1, PRKAR1A, PTCH1, PTEN, RAD51C, RAD51D, RB1, RET, RPS20, RUNX1, SDHA, SDHAF2, SDHB, SDHC, SDHD, SMAD4, SMARCA4, SMARCB1, SMARCE1, STK11, SUFU, TMEM127, TP53, TSC1, TSC2, VHL, and WT1 (sequencing and deletion/duplication); EGFR, HOXB13, KIT, MITF, PDGFRA, POLD1, and POLE (sequencing only); EPCAM and GREM1 (deletion/duplication only).    The test report has been scanned into EPIC and is located under the Molecular Pathology section of the Results Review tab.  A portion of the result report is included below for reference. Genetic testing reported out on 02/06/2024.       Even though a pathogenic variant was not identified, possible explanations for the cancer in the family may include: There may be no hereditary risk for cancer in the family. The cancers in her family may be due to other genetic or environmental factors. There may be a gene mutation in one of these genes that current testing methods cannot detect, but that chance is small. There could be another gene that has not yet been discovered, or that we have not yet tested, that is responsible for the cancer diagnoses in the family.  It is also possible there is a  hereditary cause for the cancer in the family that Ms. Tangonan did not inherit.  Therefore, it is important to remain in touch with cancer genetics in the future so that we can continue to offer Ms. Lukens the most up to date genetic testing.   ADDITIONAL GENETIC TESTING:  We discussed with [name] that her genetic testing was fairly extensive.  If there are genes identified to increase cancer risk that can be analyzed in the future, we would be happy to discuss and coordinate this testing at that time.    CANCER SCREENING RECOMMENDATIONS:  [name] test result is considered negative (normal).  This means that we have not identified a hereditary cause for her family  history of cancer at this time.   An individual's cancer risk and medical management are not determined by genetic test results alone. Overall cancer risk assessment incorporates additional factors, including personal medical history, family history, and any available genetic information that may result in a personalized plan for cancer prevention and surveillance. Therefore, it is recommended she continue to follow the cancer management and screening guidelines provided by her primary healthcare provider.  Based on the reported personal and family history, specific cancer screenings for [name] and her family include:  Breast Cancer Screening:  The Tyrer-Cuzick model is one of multiple prediction models developed to estimate an individual's lifetime risk of developing breast cancer. The Tyrer-Cuzick model is endorsed by the [employer] (NCCN). This model includes many risk factors such as family history, endogenous estrogen exposure, and benign breast disease. The calculation is highly-dependent on the accuracy of clinical data provided by the patient and can change over time. The Tyrer-Cuzick model may be repeated to reflect new information in her personal or family history in the future.   [name] Tyrer-Cuzick risk score is 15%.  This is above the general population risk of 12%, therefore, she is encouraged to continue to be mindful of her family history and be diligent with general population breast screening, including annual mammograms beginning 10 years prior to the youngest diagnosis in her family or by age 53.  She is encouraged to contact us  regarding any changes to her personal or family history, as her recommendations for screening would be altered significantly if her lifetime risk is determined to be greater than 20% based on updated information.      Colon Cancer Screening: Due to [name]'s father's history of colon cancer, she is  recommended to repeat colonoscopies every 5 years. More frequent colonoscopies may be recommended if polyps are identified.  RECOMMENDATIONS FOR FAMILY MEMBERS:   Since she did not inherit a mutation in a cancer predisposition gene included on this panel, her children could not have inherited a mutation from her in one of these genes. Individuals in this family might be at some increased risk of developing cancer, over the general population risk, due to the family history of cancer. We recommend women in this family have a yearly mammogram beginning at age 30, or 65 years younger than the earliest onset of cancer, an annual clinical breast exam, and perform monthly breast self-exams.  Other members of the family may still carry a pathogenic variant in one of these genes that Ms. Nussbaumer did not inherit. Based on the family history, we recommend her mother and sister who was diagnosed with breast cancer have genetic counseling and testing.   FOLLOW-UP:  Cancer genetics is a rapidly advancing field and it is  possible that new genetic tests will be appropriate for her and/or her family members in the future. We encouraged her to remain in contact with cancer genetics on an annual basis so we can update her personal and family histories and let her know of advances in cancer genetics that may benefit this family.   Our contact number was provided. Ms. Moline questions were answered to her satisfaction, and she knows she is welcome to call us  at anytime with additional questions or concerns.   Jeffrey Voth, MS, [facility] Genetic Counselor [email or web address] (P) [phone]

## 2024-02-14 ENCOUNTER — Ambulatory Visit (INDEPENDENT_AMBULATORY_CARE_PROVIDER_SITE_OTHER): Payer: Self-pay

## 2024-02-14 DIAGNOSIS — J309 Allergic rhinitis, unspecified: Secondary | ICD-10-CM

## 2024-02-16 DIAGNOSIS — M5412 Radiculopathy, cervical region: Secondary | ICD-10-CM | POA: Diagnosis not present

## 2024-02-16 DIAGNOSIS — Z79899 Other long term (current) drug therapy: Secondary | ICD-10-CM | POA: Diagnosis not present

## 2024-02-16 DIAGNOSIS — Z79891 Long term (current) use of opiate analgesic: Secondary | ICD-10-CM | POA: Diagnosis not present

## 2024-02-16 DIAGNOSIS — M5459 Other low back pain: Secondary | ICD-10-CM | POA: Diagnosis not present

## 2024-02-22 ENCOUNTER — Ambulatory Visit (INDEPENDENT_AMBULATORY_CARE_PROVIDER_SITE_OTHER): Payer: Self-pay

## 2024-02-22 DIAGNOSIS — J309 Allergic rhinitis, unspecified: Secondary | ICD-10-CM | POA: Diagnosis not present

## 2024-02-28 ENCOUNTER — Ambulatory Visit (INDEPENDENT_AMBULATORY_CARE_PROVIDER_SITE_OTHER)

## 2024-02-28 DIAGNOSIS — J309 Allergic rhinitis, unspecified: Secondary | ICD-10-CM | POA: Diagnosis not present

## 2024-03-06 DIAGNOSIS — M25661 Stiffness of right knee, not elsewhere classified: Secondary | ICD-10-CM | POA: Diagnosis not present

## 2024-03-06 DIAGNOSIS — M1711 Unilateral primary osteoarthritis, right knee: Secondary | ICD-10-CM | POA: Diagnosis not present

## 2024-03-06 DIAGNOSIS — G8929 Other chronic pain: Secondary | ICD-10-CM | POA: Diagnosis not present

## 2024-03-06 DIAGNOSIS — M25561 Pain in right knee: Secondary | ICD-10-CM | POA: Diagnosis not present

## 2024-03-07 ENCOUNTER — Ambulatory Visit (INDEPENDENT_AMBULATORY_CARE_PROVIDER_SITE_OTHER)

## 2024-03-07 DIAGNOSIS — Z5181 Encounter for therapeutic drug level monitoring: Secondary | ICD-10-CM | POA: Diagnosis not present

## 2024-03-07 DIAGNOSIS — J309 Allergic rhinitis, unspecified: Secondary | ICD-10-CM

## 2024-03-07 DIAGNOSIS — Z79891 Long term (current) use of opiate analgesic: Secondary | ICD-10-CM | POA: Diagnosis not present

## 2024-03-12 DIAGNOSIS — F4312 Post-traumatic stress disorder, chronic: Secondary | ICD-10-CM | POA: Diagnosis not present

## 2024-03-12 DIAGNOSIS — F411 Generalized anxiety disorder: Secondary | ICD-10-CM | POA: Diagnosis not present

## 2024-03-12 DIAGNOSIS — F332 Major depressive disorder, recurrent severe without psychotic features: Secondary | ICD-10-CM | POA: Diagnosis not present

## 2024-03-14 ENCOUNTER — Ambulatory Visit (INDEPENDENT_AMBULATORY_CARE_PROVIDER_SITE_OTHER)

## 2024-03-14 DIAGNOSIS — K2289 Other specified disease of esophagus: Secondary | ICD-10-CM | POA: Diagnosis not present

## 2024-03-14 DIAGNOSIS — R42 Dizziness and giddiness: Secondary | ICD-10-CM | POA: Diagnosis not present

## 2024-03-14 DIAGNOSIS — R1013 Epigastric pain: Secondary | ICD-10-CM | POA: Diagnosis not present

## 2024-03-14 DIAGNOSIS — J309 Allergic rhinitis, unspecified: Secondary | ICD-10-CM | POA: Diagnosis not present

## 2024-03-14 DIAGNOSIS — R110 Nausea: Secondary | ICD-10-CM | POA: Diagnosis not present

## 2024-03-20 NOTE — Progress Notes (Signed)
 Sent message, via epic in basket, requesting orders in epic from Careers adviser.

## 2024-03-21 NOTE — H&P (Signed)
 Patient's anticipated LOS is less than 2 midnights, meeting these requirements: - Younger than 33 - Lives within 1 hour of care - Has a competent adult at home to recover with post-op recover - NO history of  - Chronic pain requiring opiods  - Diabetes  - Coronary Artery Disease  - Heart failure  - Heart attack  - Stroke  - DVT/VTE  - Cardiac arrhythmia  - Respiratory Failure/COPD  - Renal failure  - Anemia  - Advanced Liver disease     [name] is an 53 y.o. female.    Chief Complaint: right knee pain  HPI: Pt is a 53 y.o. female complaining of right knee pain for multiple years. Pain had continually increased since the beginning. X-rays in the clinic show end-stage arthritic changes of the right knee. Pt has tried various conservative treatments which have failed to alleviate their symptoms, including injections and therapy. Various options are discussed with the patient. Risks, benefits and expectations were discussed with the patient. Patient understand the risks, benefits and expectations and wishes to proceed with surgery.   PCP:  Vernon Velna SAUNDERS, MD  D/C Plans: Home  PMH: Past Medical History:  Diagnosis Date   Allergy     Anxiety    Asthma    Bipolar 1 disorder (HCC)    Chronic lower back pain 02/07/2014   Depression    excessive daytime sleepiness-MSLT confirmed pathological degree of hypersomnia-12/13   Eczema    Esophageal reflux    Gastroparesis    Hypersomnia    Hypertension    Medication monitoring encounter 02/07/2014   Migraine    Migraine    Migraines    Narcolepsy    OCD (obsessive compulsive disorder)    Opiate use 07/02/2015   Osteoarthritis    knees, hands, shoulder, lower back   Other iron deficiency anemias 08/28/2015   Overactive bladder    Psychosis (HCC)    Restless leg syndrome    S/P lumbar fusion 09/06/2009   Schizophrenia (HCC)    Schizophrenia, schizo-affective (HCC)    Seasonal allergies    Sleep apnea    uses  CPAP every night   Torn rotator cuff it:2012,rt:2013    PSH: Past Surgical History:  Procedure Laterality Date   ABDOMINAL HYSTERECTOMY  2001   ANKLE FUSION     right   CHOLECYSTECTOMY N/A 07/20/2013   Procedure: LAPAROSCOPIC CHOLECYSTECTOMY;  Surgeon: Vicenta DELENA Poli, MD;  Location: [facility] OR;  Service: General;  Laterality: N/A;   COLONOSCOPY WITH PROPOFOL  N/A 10/02/2013   Procedure: COLONOSCOPY WITH PROPOFOL ;  Surgeon: Gladis MARLA Louder, MD;  Location: [facility];  Service: Endoscopy;  Laterality: N/A;   ESOPHAGOGASTRODUODENOSCOPY (EGD) WITH PROPOFOL  N/A 10/02/2013   Procedure: ESOPHAGOGASTRODUODENOSCOPY (EGD) WITH PROPOFOL ;  Surgeon: Gladis MARLA Louder, MD;  Location: [facility];  Service: Endoscopy;  Laterality: N/A;   FLEXIBLE SIGMOIDOSCOPY Left 04/07/2015   Procedure: FLEXIBLE SIGMOIDOSCOPY;  Surgeon: Lamar Bunk, MD;  Location: [facility] ENDOSCOPY;  Service: Endoscopy;  Laterality: Left;   FRACTURE SURGERY     LAPAROSCOPIC SALPINGO OOPHERECTOMY Left 12/05/2013   Procedure: LAPAROSCOPIC SALPINGO OOPHORECTOMY;  Surgeon: Hargis Paradise, MD;  Location: [facility];  Service: Gynecology;  Laterality: Left;   LUMBAR FUSION  2011   L4-L5   OOPHORECTOMY   2004   right   ROTATOR CUFF REPAIR  2012,2013   torn    SHOULDER SURGERY     left and right , bone spurs, torn muscle   SPINE SURGERY  spurs,ruptured disc bone  2009   TUBAL LIGATION  2004   VESICOVAGINAL FISTULA CLOSURE W/ TAH  2001   WISDOM TOOTH EXTRACTION      Social History:  reports that she has never smoked. She has never been exposed to tobacco smoke. She has never used smokeless tobacco. She reports that she does not drink alcohol and does not use drugs. BMI: Estimated body mass index is 35.29 kg/m as calculated from the following:   Height as of 07/12/23: 5' 3 (1.6 m).   Weight as of 02/07/24: 90.4 kg.  Lab Results  Component Value Date   ALBUMIN 4.7 11/14/2016   Diabetes:   Patient has a diagnosis of diabetes, No  results found for: HGBA1C Smoking Status:   reports that she has never smoked. She has never been exposed to tobacco smoke. She has never used smokeless tobacco.    Allergies:  Allergies  Allergen Reactions   Tramadol     Naproxen Sodium    Adhesive [Tape] Rash    Glue from steri strips   Aspirin Nausea Only   Benadryl [Diphenhydramine Hcl] Other (See Comments)    Makes her hyper, jittery   Codeine Nausea And Vomiting   Dust Mite Extract Other (See Comments)    Cough, sneeze, eyes runny   Metoclopramide      nervous   Mold Extract [Trichophyton] Other (See Comments)    Cough, sneeze, eyes/nose runny   Molds & Smuts     Cough, sneeze, eyes/nose runny   Pollen Extract Other (See Comments)    Cough, sneeze, eyes/nose runny   Prednisone  Other (See Comments)    Makes her hyper, jittery    Toradol  [Ketorolac  Tromethamine ] Anxiety   Zofran  [Ondansetron ] Nausea And Vomiting    Medications: No current facility-administered medications for this encounter.   Current Outpatient Medications  Medication Sig Dispense Refill   acyclovir ointment (ZOVIRAX) 5 % 1 application every 3 hours Externally as needed for 7 days     albuterol  (VENTOLIN  HFA) 108 (90 Base) MCG/ACT inhaler Inhale 1-2 puffs into the lungs every 6 (six) hours as needed for wheezing or shortness of breath. 18 g 1   amphetamine -dextroamphetamine  (ADDERALL) 10 MG tablet 2 tablets PO in AM and one tab at lunch. 90 tablet 0   atorvastatin (LIPITOR) 40 MG tablet 1 tablet Orally Once a day     azelastine  (ASTELIN ) 0.1 % nasal spray Place 2 sprays into both nostrils 2 (two) times daily as needed for rhinitis. Use in each nostril as directed 30 mL 5   budesonide -formoterol  (SYMBICORT ) 160-4.5 MCG/ACT inhaler With respiratory illness or flare ups, start 2 puffs twice daily for 1-2 weeks. 1 each 5   Clobetasol  Propionate (CLOBEX ) 0.05 % shampoo Apply 1 application topically every Wednesday.      clotrimazole-betamethasone  (LOTRISONE) cream 1 application to affected area Externally Twice a day     clotrimazole-betamethasone (LOTRISONE) lotion 1 application to affected area Externally Twice a day as needed     colestipol (COLESTID) 1 g tablet 1 tablets Orally Once a day     cyclobenzaprine (FLEXERIL) 10 MG tablet Oral for 10 Days     cycloSPORINE  (RESTASIS ) 0.05 % ophthalmic emulsion Place 1 drop into both eyes every 12 (twelve) hours.       dexamethasone  (DECADRON ) 2 MG tablet 1 tablet Orally every 12 hrs for 5 days     diclofenac  (VOLTAREN ) 50 MG EC tablet Take 1 tablet (50 mg total) by mouth 2 (two) times daily.  60 tablet 0   dicyclomine  (BENTYL ) 10 MG capsule Take 10 mg by mouth 4 (four) times daily as needed for spasms.     dronabinol  (MARINOL ) 5 MG capsule TK 1 C PO BID  3   EPINEPHrine  0.3 mg/0.3 mL IJ SOAJ injection Inject 0.3 mg into the muscle as needed for anaphylaxis. 0.3 mL 2   esomeprazole (NEXIUM) 40 MG capsule Take 40 mg by mouth daily at 12 noon.     estradiol  (ESTRACE ) 1 MG tablet 1 tablet Orally Once a day     famotidine  (PEPCID ) 20 MG tablet Take 1 tablet (20 mg total) by mouth 2 (two) times daily. 30 tablet 0   FLUOCINOLONE  ACETONIDE SCALP 0.01 % OIL Apply 1 application  topically daily.  0   fluocinonide (LIDEX) 0.05 % cream Apply 1 application topically 3 (three) times a week.      gabapentin  (NEURONTIN ) 300 MG capsule Take 1 capsule (300 mg total) by mouth 3 (three) times daily. 90 capsule 2   hydrochlorothiazide  (HYDRODIURIL ) 25 MG tablet Take 25 mg by mouth every morning.      hydrocortisone (PROCTOCORT) 1 % CREA Apply topically.     ibuprofen  (ADVIL ) 600 MG tablet Take 1 tablet (600 mg total) by mouth every 6 (six) hours as needed. 30 tablet 0   ipratropium (ATROVENT ) 0.06 % nasal spray Place 2 sprays into both nostrils 3 (three) times daily as needed for rhinitis. 15 mL 5   levocetirizine (XYZAL ) 5 MG tablet Take 1 tablet (5 mg total) by mouth every evening. 30 tablet 5   lidocaine   (XYLOCAINE ) 2 % jelly 1 application by Other route as needed (rectally).     LORazepam  (ATIVAN ) 0.5 MG tablet Take 0.5 mg by mouth every 6 (six) hours as needed for anxiety.     loteprednol  (LOTEMAX ) 0.2 % SUSP Place 1 drop into both eyes daily.     lurasidone  (LATUDA ) 40 MG TABS tablet Take by mouth.     montelukast  (SINGULAIR ) 10 MG tablet Take 1 tablet (10 mg total) by mouth daily. 30 tablet 5   MYRBETRIQ 25 MG TB24 tablet TK 1 T PO QD  3   nitroGLYCERIN (NITROGLYN) 2 % ointment Apply 1 inch topically at bedtime.     NURTEC 75 MG TBDP Take by mouth.     Olopatadine HCl (PATADAY) 0.2 % SOLN Place 1 drop into both eyes 2 (two) times daily as needed (for allergies).     OnabotulinumtoxinA (BOTOX IJ) Inject as directed every 3 (three) months.     Oxycodone  HCl 10 MG TABS Take 10 mg by mouth 3 (three) times daily as needed.     pantoprazole  (PROTONIX ) 40 MG tablet Take 40 mg by mouth daily.     pramipexole  (MIRAPEX ) 1 MG tablet Take 1 mg by mouth at bedtime.  0   predniSONE  (DELTASONE ) 20 MG tablet Take 0.5 tablets (10 mg total) by mouth daily. 3 tablet 0   PREMARIN  0.3 MG tablet Take 0.3 mg by mouth daily.     promethazine  (PHENERGAN ) 25 MG tablet Take 50 mg by mouth every 6 (six) hours as needed for nausea or vomiting.  3   PROMETHEGAN 25 MG suppository   11   RECTIV 0.4 % OINT   0   RELPAX  40 MG tablet Take 40 mg by mouth every 2 (two) hours as needed for migraine or headache.      saccharomyces boulardii (FLORASTOR) 250 MG capsule Take 1 capsule (250 mg total)  by mouth 2 (two) times daily. 60 capsule 1   scopolamine (TRANSDERM-SCOP) 1 MG/3DAYS APPLY 1 PATCH TOPICALLY TO THE SKIN EVERY 72 HOURS Transdermal for 24 Days     sucralfate (CARAFATE) 1 g tablet Take 1 g by mouth.     tiZANidine  (ZANAFLEX ) 4 MG tablet Take 1 tablet (4 mg total) by mouth every 8 (eight) hours as needed for muscle spasms. 90 tablet 2   topiramate  (TOPAMAX ) 100 MG tablet Take 100-200 mg by mouth 2 (two) times daily. 1  tab every AM and 2 at night     traZODone (DESYREL) 50 MG tablet Take 50 mg by mouth at bedtime as needed for sleep.     triamcinolone  cream (KENALOG) 0.1 % Apply 1 application topically daily as needed (to affected area).      valACYclovir (VALTREX) 1000 MG tablet 1 tablet Orally Once a day     verapamil  (VERELAN  PM) 240 MG 24 hr capsule Take 240 mg by mouth at bedtime.     Vitamin D , Ergocalciferol , (DRISDOL) 50000 UNITS CAPS capsule Take 50,000 Units by mouth every 7 (seven) days.      No results found for this or any previous visit (from the past 48 hours). No results found.  ROS: Pain with rom of the right lower extremity  Physical Exam: Alert and oriented 53 y.o. female in no acute distress Cranial nerves 2-12 intact Cervical spine: full rom with no tenderness, nv intact distally Chest: active breath sounds bilaterally, no wheeze rhonchi or rales Heart: regular rate and rhythm, no murmur Abd: non tender non distended with active bowel sounds Hip is stable with rom  Right knee painful rom with crepitus Nv intact distally No rashes or edema distally  Assessment/Plan Assessment: right knee end stage osteoarthritis  Plan:  Patient will undergo a right total knee by Dr. Kay at [facility] Risks benefits and expectations were discussed with the patient. Patient understand risks, benefits and expectations and wishes to proceed. Preoperative templating of the joint replacement has been completed, documented, and submitted to the Operating Room personnel in order to optimize intra-operative equipment management.   Arvella Fireman PA-C, MPAS [facility] is now [employer] [address], [address], [place], [place] [address] Phone: [phone] [email or web address] Facebook  [employer]

## 2024-03-23 ENCOUNTER — Ambulatory Visit

## 2024-03-23 DIAGNOSIS — J309 Allergic rhinitis, unspecified: Secondary | ICD-10-CM | POA: Diagnosis not present

## 2024-03-26 NOTE — Patient Instructions (Signed)
 SURGICAL WAITING ROOM VISITATION  Patients having surgery or a procedure may have no more than 2 support people in the waiting area - these visitors may rotate.    Children under the age of 62 must have an adult with them who is not the patient.  Visitors with respiratory illnesses are discouraged from visiting and should remain at home.  If the patient needs to stay at the hospital during part of their recovery, the visitor guidelines for inpatient rooms apply. Pre-op nurse will coordinate an appropriate time for 1 support person to accompany patient in pre-op.  This support person may not rotate.    Please refer to the [facility] website for the visitor guidelines for Inpatients (after your surgery is over and you are in a regular room).       Your procedure is scheduled on: 04/06/24   Report to [facility] Main Entrance    Report to admitting at : 6:30 AM   Call this number if you have problems the morning of surgery [phone]   Do not eat food :After Midnight.   After Midnight you may have the following liquids until : 6:00 AM DAY OF SURGERY  Water Non-Citrus Juices (without pulp, NO RED-Apple, White grape, White cranberry) Black Coffee (NO MILK/CREAM OR CREAMERS, sugar ok)  Clear Tea (NO MILK/CREAM OR CREAMERS, sugar ok) regular and decaf                             Plain Jell-O (NO RED)                                           Fruit ices (not with fruit pulp, NO RED)                                     Popsicles (NO RED)                                                               Sports drinks like Gatorade (NO RED)   The day of surgery:  Drink ONE (1) Pre-Surgery Clear Ensure at : 6:00 AM the morning of surgery. Drink in one sitting. Do not sip.  This drink was given to you during your hospital  pre-op appointment visit. Nothing else to drink after completing the  Pre-Surgery Clear Ensure or G2.          If you have questions, please contact your  surgeon's office.  FOLLOW ANY ADDITIONAL PRE OP INSTRUCTIONS YOU RECEIVED FROM YOUR SURGEON'S OFFICE!!!   Oral Hygiene is also important to reduce your risk of infection.                                    Remember - BRUSH YOUR TEETH THE MORNING OF SURGERY WITH YOUR REGULAR TOOTHPASTE  DENTURES WILL BE REMOVED PRIOR TO SURGERY PLEASE DO NOT APPLY Poly grip OR ADHESIVES!!!   Do NOT smoke after Midnight   Stop all  vitamins and herbal supplements 7 days before surgery.   Take these medicines the morning of surgery with A SIP OF WATER: buspirone,montelukast ,gabapentin ,Cymbalta,dronabinol ,valacyclovir,verapamil ,estradiol ,nurtec,dicyclomine ,famotidine .Meclizine as needed.   Bring CPAP mask and tubing day of surgery.                              You may not have any metal on your body including hair pins, jewelry, and body piercing             Do not wear make-up, lotions, powders, perfumes/cologne, or deodorant  Do not wear nail polish including gel and S&S, artificial/acrylic nails, or any other type of covering on natural nails including finger and toenails. If you have artificial nails, gel coating, etc. that needs to be removed by a nail salon please have this removed prior to surgery or surgery may need to be canceled/ delayed if the surgeon/ anesthesia feels like they are unable to be safely monitored.   Do not shave  48 hours prior to surgery.               Men may shave face and neck.   Do not bring valuables to the hospital. [facility] IS NOT             RESPONSIBLE   FOR VALUABLES.   Contacts, glasses, dentures or bridgework may not be worn into surgery.   Bring small overnight bag day of surgery.   DO NOT BRING YOUR HOME MEDICATIONS TO THE HOSPITAL. PHARMACY WILL DISPENSE MEDICATIONS LISTED ON YOUR MEDICATION LIST TO YOU DURING YOUR ADMISSION IN THE HOSPITAL!    Patients discharged on the day of surgery will not be allowed to drive home.  Someone NEEDS to stay with you  for the first 24 hours after anesthesia.   Special Instructions: Bring a copy of your healthcare power of attorney and living will documents the day of surgery if you haven't scanned them before.              Please read over the following fact sheets you were given: IF YOU HAVE QUESTIONS ABOUT YOUR PRE-OP INSTRUCTIONS PLEASE CALL [phone].   If you received a COVID test during your pre-op visit  it is requested that you wear a mask when out in public, stay away from anyone that may not be feeling well and notify your surgeon if you develop symptoms. If you test positive for Covid or have been in contact with anyone that has tested positive in the last 10 days please notify you surgeon.      Pre-operative 5 CHG Bath Instructions   You can play a key role in reducing the risk of infection after surgery. Your skin needs to be as free of germs as possible. You can reduce the number of germs on your skin by washing with CHG (chlorhexidine gluconate) soap before surgery. CHG is an antiseptic soap that kills germs and continues to kill germs even after washing.   DO NOT use if you have an allergy  to chlorhexidine/CHG or antibacterial soaps. If your skin becomes reddened or irritated, stop using the CHG and notify one of our RNs at [phone].   Please shower with the CHG soap starting 4 days before surgery using the following schedule:     Please keep in mind the following:  DO NOT shave, including legs and underarms, starting the day of your first shower.   You may shave your face at  any point before/day of surgery.  Place clean sheets on your bed the day you start using CHG soap. Use a clean washcloth (not used since being washed) for each shower. DO NOT sleep with pets once you start using the CHG.   CHG Shower Instructions:  If you choose to wash your hair and private area, wash first with your normal shampoo/soap.  After you use shampoo/soap, rinse your hair and body thoroughly to  remove shampoo/soap residue.  Turn the water OFF and apply about 3 tablespoons (45 ml) of CHG soap to a CLEAN washcloth.  Apply CHG soap ONLY FROM YOUR NECK DOWN TO YOUR TOES (washing for 3-5 minutes)  DO NOT use CHG soap on face, private areas, open wounds, or sores.  Pay special attention to the area where your surgery is being performed.  If you are having back surgery, having someone wash your back for you may be helpful. Wait 2 minutes after CHG soap is applied, then you may rinse off the CHG soap.  Pat dry with a clean towel  Put on clean clothes/pajamas   If you choose to wear lotion, please use ONLY the CHG-compatible lotions on the back of this paper.     Additional instructions for the day of surgery: DO NOT APPLY any lotions, deodorants, cologne, or perfumes.   Put on clean/comfortable clothes.  Brush your teeth.  Ask your nurse before applying any prescription medications to the skin.   CHG Compatible Lotions   Aveeno Moisturizing lotion  Cetaphil Moisturizing Cream  Cetaphil Moisturizing Lotion  Clairol Herbal Essence Moisturizing Lotion, Dry Skin  Clairol Herbal Essence Moisturizing Lotion, Extra Dry Skin  Clairol Herbal Essence Moisturizing Lotion, Normal Skin  Curel Age Defying Therapeutic Moisturizing Lotion with Alpha Hydroxy  Curel Extreme Care Body Lotion  Curel Soothing Hands Moisturizing Hand Lotion  Curel Therapeutic Moisturizing Cream, Fragrance-Free  Curel Therapeutic Moisturizing Lotion, Fragrance-Free  Curel Therapeutic Moisturizing Lotion, Original Formula  Eucerin Daily Replenishing Lotion  Eucerin Dry Skin Therapy Plus Alpha Hydroxy Crme  Eucerin Dry Skin Therapy Plus Alpha Hydroxy Lotion  Eucerin Original Crme  Eucerin Original Lotion  Eucerin Plus Crme Eucerin Plus Lotion  Eucerin TriLipid Replenishing Lotion  Keri Anti-Bacterial Hand Lotion  Keri Deep Conditioning Original Lotion Dry Skin Formula Softly Scented  Keri Deep Conditioning  Original Lotion, Fragrance Free Sensitive Skin Formula  Keri Lotion Fast Absorbing Fragrance Free Sensitive Skin Formula  Keri Lotion Fast Absorbing Softly Scented Dry Skin Formula  Keri Original Lotion  Keri Skin Renewal Lotion Keri Silky Smooth Lotion  Keri Silky Smooth Sensitive Skin Lotion  Nivea Body Creamy Conditioning Oil  Nivea Body Extra Enriched Lotion  Nivea Body Original Lotion  Nivea Body Sheer Moisturizing Lotion Nivea Crme  Nivea Skin Firming Lotion  NutraDerm 30 Skin Lotion  NutraDerm Skin Lotion  NutraDerm Therapeutic Skin Cream  NutraDerm Therapeutic Skin Lotion  ProShield Protective Hand Cream  Provon moisturizing lotion   Incentive Spirometer  An incentive spirometer is a tool that can help keep your lungs clear and active. This tool measures how well you are filling your lungs with each breath. Taking long deep breaths may help reverse or decrease the chance of developing breathing (pulmonary) problems (especially infection) following: A long period of time when you are unable to move or be active. BEFORE THE PROCEDURE  If the spirometer includes an indicator to show your best effort, your nurse or respiratory therapist will set it to a desired goal. If possible, sit up  straight or lean slightly forward. Try not to slouch. Hold the incentive spirometer in an upright position. INSTRUCTIONS FOR USE  Sit on the edge of your bed if possible, or sit up as far as you can in bed or on a chair. Hold the incentive spirometer in an upright position. Breathe out normally. Place the mouthpiece in your mouth and seal your lips tightly around it. Breathe in slowly and as deeply as possible, raising the piston or the ball toward the top of the column. Hold your breath for 3-5 seconds or for as long as possible. Allow the piston or ball to fall to the bottom of the column. Remove the mouthpiece from your mouth and breathe out normally. Rest for a few seconds and repeat Steps 1  through 7 at least 10 times every 1-2 hours when you are awake. Take your time and take a few normal breaths between deep breaths. The spirometer may include an indicator to show your best effort. Use the indicator as a goal to work toward during each repetition. After each set of 10 deep breaths, practice coughing to be sure your lungs are clear. If you have an incision (the cut made at the time of surgery), support your incision when coughing by placing a pillow or rolled up towels firmly against it. Once you are able to get out of bed, walk around indoors and cough well. You may stop using the incentive spirometer when instructed by your caregiver.  RISKS AND COMPLICATIONS Take your time so you do not get dizzy or light-headed. If you are in pain, you may need to take or ask for pain medication before doing incentive spirometry. It is harder to take a deep breath if you are having pain. AFTER USE Rest and breathe slowly and easily. It can be helpful to keep track of a log of your progress. Your caregiver can provide you with a simple table to help with this. If you are using the spirometer at home, follow these instructions: SEEK MEDICAL CARE IF:  You are having difficultly using the spirometer. You have trouble using the spirometer as often as instructed. Your pain medication is not giving enough relief while using the spirometer. You develop fever of 100.5 F (38.1 C) or higher. SEEK IMMEDIATE MEDICAL CARE IF:  You cough up bloody sputum that had not been present before. You develop fever of 102 F (38.9 C) or greater. You develop worsening pain at or near the incision site. MAKE SURE YOU:  Understand these instructions. Will watch your condition. Will get help right away if you are not doing well or get worse. Document Released: 01/03/2007 Document Revised: 11/15/2011 Document Reviewed: 03/06/2007 [facility] Patient Information 2014 [place],  [place].   ________________________________________________________________________

## 2024-03-27 ENCOUNTER — Encounter (HOSPITAL_COMMUNITY)
Admission: RE | Admit: 2024-03-27 | Discharge: 2024-03-27 | Disposition: A | Discharge status: Home / Self Care | Source: Ambulatory Visit | Attending: Orthopedic Surgery | Admitting: Orthopedic Surgery

## 2024-03-27 ENCOUNTER — Ambulatory Visit (INDEPENDENT_AMBULATORY_CARE_PROVIDER_SITE_OTHER)

## 2024-03-27 ENCOUNTER — Encounter (HOSPITAL_COMMUNITY): Payer: Self-pay

## 2024-03-27 ENCOUNTER — Other Ambulatory Visit: Payer: Self-pay

## 2024-03-27 VITALS — BP 140/98 | HR 88 | Temp 98.3°F | Ht 62.0 in | Wt 196.0 lb

## 2024-03-27 DIAGNOSIS — J309 Allergic rhinitis, unspecified: Secondary | ICD-10-CM | POA: Diagnosis not present

## 2024-03-27 DIAGNOSIS — Z01818 Encounter for other preprocedural examination: Secondary | ICD-10-CM | POA: Insufficient documentation

## 2024-03-27 DIAGNOSIS — I10 Essential (primary) hypertension: Secondary | ICD-10-CM | POA: Insufficient documentation

## 2024-03-27 LAB — BASIC METABOLIC PANEL WITH GFR
Anion gap: 10 (ref 5–15)
BUN: 18 mg/dL (ref 6–20)
CO2: 25 mmol/L (ref 22–32)
Calcium: 9.1 mg/dL (ref 8.9–10.3)
Chloride: 101 mmol/L (ref 98–111)
Creatinine, Ser: 0.95 mg/dL (ref 0.44–1.00)
GFR, Estimated: >60 mL/min (ref >60)
Glucose, Bld: 86 mg/dL (ref 70–99)
Potassium: 3.7 mmol/L (ref 3.5–5.1)
Sodium: 136 mmol/L (ref 135–145)

## 2024-03-27 LAB — CBC
HCT: 39.9 % (ref 36.0–46.0)
Hemoglobin: 13.7 g/dL (ref 12.0–15.0)
MCH: 28 pg (ref 26.0–34.0)
MCHC: 34.3 g/dL (ref 30.0–36.0)
MCV: 81.4 fL (ref 80.0–100.0)
Platelets: 388 K/uL (ref 150–400)
RBC: 4.9 MIL/uL (ref 3.87–5.11)
RDW: 13.8 % (ref 11.5–15.5)
WBC: 9.8 K/uL (ref 4.0–10.5)
nRBC: 0 % (ref 0.0–0.2)

## 2024-03-27 LAB — SURGICAL PCR SCREEN
MRSA, PCR: NEGATIVE
Staphylococcus aureus: NEGATIVE

## 2024-03-27 NOTE — Progress Notes (Signed)
 For Anesthesia: PCP - Vernon Velna SAUNDERS, MD  Cardiologist - N/A  Bowel Prep reminder:  Chest x-ray -  EKG - 03/27/24 Stress Test -  ECHO -  Cardiac Cath -  Pacemaker/ICD device last checked: Pacemaker orders received: Device Rep notified:  Spinal Cord Stimulator:N/A  Sleep Study - Yes CPAP - Yes  Fasting Blood Sugar - N/A Checks Blood Sugar _____ times a day Date and result of last Hgb A1c-  Last dose of GLP1 agonist- N/A GLP1 instructions:   Last dose of SGLT-2 inhibitors- N/A SGLT-2 instructions:   Blood Thinner Instructions:N/A Aspirin Instructions: Last Dose:  Activity level: Can go up a flight of stairs and activities of daily living without stopping and without chest pain and/or shortness of breath   Able to exercise without chest pain and/or shortness of breath  Anesthesia review: Hx: HTN,OSA(CPAP).  Patient denies shortness of breath, fever, cough and chest pain at PAT appointment   Patient verbalized understanding of instructions that were reviewed over the telephone.

## 2024-04-03 ENCOUNTER — Ambulatory Visit (INDEPENDENT_AMBULATORY_CARE_PROVIDER_SITE_OTHER)

## 2024-04-03 DIAGNOSIS — J309 Allergic rhinitis, unspecified: Secondary | ICD-10-CM | POA: Diagnosis not present

## 2024-04-06 ENCOUNTER — Ambulatory Visit (HOSPITAL_COMMUNITY): Payer: Self-pay | Admitting: Anesthesiology

## 2024-04-06 ENCOUNTER — Other Ambulatory Visit: Payer: Self-pay

## 2024-04-06 ENCOUNTER — Encounter (HOSPITAL_COMMUNITY)
Admission: RE | Disposition: A | Discharge status: Home / Self Care | Payer: Self-pay | Source: Home / Self Care | Attending: Orthopedic Surgery

## 2024-04-06 ENCOUNTER — Ambulatory Visit (HOSPITAL_COMMUNITY)
Admission: RE | Admit: 2024-04-06 | Discharge: 2024-04-06 | Disposition: A | Discharge status: Home / Self Care | Attending: Orthopedic Surgery | Admitting: Orthopedic Surgery

## 2024-04-06 ENCOUNTER — Encounter (HOSPITAL_COMMUNITY): Payer: Self-pay | Admitting: Orthopedic Surgery

## 2024-04-06 DIAGNOSIS — Z79899 Other long term (current) drug therapy: Secondary | ICD-10-CM | POA: Diagnosis not present

## 2024-04-06 DIAGNOSIS — F209 Schizophrenia, unspecified: Secondary | ICD-10-CM | POA: Diagnosis not present

## 2024-04-06 DIAGNOSIS — M1711 Unilateral primary osteoarthritis, right knee: Secondary | ICD-10-CM | POA: Insufficient documentation

## 2024-04-06 DIAGNOSIS — F429 Obsessive-compulsive disorder, unspecified: Secondary | ICD-10-CM | POA: Insufficient documentation

## 2024-04-06 DIAGNOSIS — E1143 Type 2 diabetes mellitus with diabetic autonomic (poly)neuropathy: Secondary | ICD-10-CM | POA: Insufficient documentation

## 2024-04-06 DIAGNOSIS — F419 Anxiety disorder, unspecified: Secondary | ICD-10-CM | POA: Diagnosis not present

## 2024-04-06 DIAGNOSIS — I10 Essential (primary) hypertension: Secondary | ICD-10-CM | POA: Insufficient documentation

## 2024-04-06 DIAGNOSIS — G8929 Other chronic pain: Secondary | ICD-10-CM

## 2024-04-06 DIAGNOSIS — J45909 Unspecified asthma, uncomplicated: Secondary | ICD-10-CM | POA: Diagnosis not present

## 2024-04-06 DIAGNOSIS — K3184 Gastroparesis: Secondary | ICD-10-CM | POA: Insufficient documentation

## 2024-04-06 DIAGNOSIS — K219 Gastro-esophageal reflux disease without esophagitis: Secondary | ICD-10-CM | POA: Insufficient documentation

## 2024-04-06 DIAGNOSIS — G4730 Sleep apnea, unspecified: Secondary | ICD-10-CM | POA: Diagnosis not present

## 2024-04-06 DIAGNOSIS — F319 Bipolar disorder, unspecified: Secondary | ICD-10-CM | POA: Insufficient documentation

## 2024-04-06 DIAGNOSIS — Z7951 Long term (current) use of inhaled steroids: Secondary | ICD-10-CM | POA: Diagnosis not present

## 2024-04-06 DIAGNOSIS — G8918 Other acute postprocedural pain: Secondary | ICD-10-CM | POA: Diagnosis not present

## 2024-04-06 LAB — GLUCOSE, CAPILLARY: Glucose-Capillary: 90 mg/dL (ref 70–99)

## 2024-04-06 SURGERY — ARTHROPLASTY, KNEE, TOTAL
Anesthesia: Spinal | Site: Knee | Laterality: Right

## 2024-04-06 MED ORDER — PROPOFOL 500 MG/50ML IV EMUL
INTRAVENOUS | Status: DC | PRN
Start: 2024-04-06 — End: 2024-04-06
  Administered 2024-04-06: 75 ug/kg/min via INTRAVENOUS

## 2024-04-06 MED ORDER — TRANEXAMIC ACID-NACL 1000-0.7 MG/100ML-% IV SOLN
1000.0000 mg | Freq: Once | INTRAVENOUS | Status: AC
  Administered 2024-04-06: 1000 mg via INTRAVENOUS

## 2024-04-06 MED ORDER — TRANEXAMIC ACID-NACL 1000-0.7 MG/100ML-% IV SOLN
1000.0000 mg | INTRAVENOUS | Status: AC
  Administered 2024-04-06: 1000 mg via INTRAVENOUS
  Filled 2024-04-06: qty 100

## 2024-04-06 MED ORDER — SODIUM CHLORIDE (PF) 0.9 % IJ SOLN
INTRAMUSCULAR | Status: DC | PRN
  Administered 2024-04-06: 80 mL

## 2024-04-06 MED ORDER — ONDANSETRON HCL 4 MG/2ML IJ SOLN
INTRAMUSCULAR | Status: AC
Start: 2024-04-06 — End: 2024-04-06
  Filled 2024-04-06: qty 2

## 2024-04-06 MED ORDER — MIDAZOLAM HCL 2 MG/2ML IJ SOLN
1.0000 mg | INTRAMUSCULAR | Status: DC
  Administered 2024-04-06: 2 mg via INTRAVENOUS
  Filled 2024-04-06: qty 2

## 2024-04-06 MED ORDER — ROPIVACAINE HCL 5 MG/ML IJ SOLN
INTRAMUSCULAR | Status: DC | PRN
Start: 2024-04-06 — End: 2024-04-06
  Administered 2024-04-06: 20 mL via PERINEURAL

## 2024-04-06 MED ORDER — KETAMINE HCL 50 MG/5ML IJ SOSY
PREFILLED_SYRINGE | INTRAMUSCULAR | Status: AC
  Filled 2024-04-06: qty 5

## 2024-04-06 MED ORDER — BUPIVACAINE-EPINEPHRINE (PF) 0.25% -1:200000 IJ SOLN
INTRAMUSCULAR | Status: AC
  Filled 2024-04-06: qty 30

## 2024-04-06 MED ORDER — DEXAMETHASONE SODIUM PHOSPHATE 10 MG/ML IJ SOLN
INTRAMUSCULAR | Status: DC | PRN
Start: 2024-04-06 — End: 2024-04-06
  Administered 2024-04-06: 4 mg via INTRAVENOUS

## 2024-04-06 MED ORDER — TIZANIDINE HCL 4 MG PO TABS: 4.0000 mg | ORAL_TABLET | Freq: Three times a day (TID) | ORAL | 0 refills | Status: AC | PRN

## 2024-04-06 MED ORDER — DEXAMETHASONE SODIUM PHOSPHATE 10 MG/ML IJ SOLN
INTRAMUSCULAR | Status: AC
  Filled 2024-04-06: qty 1

## 2024-04-06 MED ORDER — LACTATED RINGERS IV SOLN: INTRAVENOUS | Status: DC

## 2024-04-06 MED ORDER — FENTANYL CITRATE (PF) 100 MCG/2ML IJ SOLN
INTRAMUSCULAR | Status: DC | PRN
  Administered 2024-04-06 (×2): 50 ug via INTRAVENOUS

## 2024-04-06 MED ORDER — SODIUM CHLORIDE 0.9 % IR SOLN
Status: DC | PRN
  Administered 2024-04-06: 1000 mL

## 2024-04-06 MED ORDER — POVIDONE-IODINE 10 % EX SWAB: 2.0000 | Freq: Once | CUTANEOUS | Status: DC

## 2024-04-06 MED ORDER — DEXMEDETOMIDINE HCL IN NACL 80 MCG/20ML IV SOLN
INTRAVENOUS | Status: DC | PRN
  Administered 2024-04-06: 8 ug via INTRAVENOUS

## 2024-04-06 MED ORDER — SODIUM CHLORIDE (PF) 0.9 % IJ SOLN
INTRAMUSCULAR | Status: AC
  Filled 2024-04-06: qty 30

## 2024-04-06 MED ORDER — CLONIDINE HCL (ANALGESIA) 100 MCG/ML EP SOLN
EPIDURAL | Status: DC | PRN
  Administered 2024-04-06: 50 ug

## 2024-04-06 MED ORDER — BUPIVACAINE-EPINEPHRINE (PF) 0.25% -1:200000 IJ SOLN
INTRAMUSCULAR | Status: AC
Start: 2024-04-06 — End: 2024-04-06
  Filled 2024-04-06: qty 30

## 2024-04-06 MED ORDER — TRANEXAMIC ACID-NACL 1000-0.7 MG/100ML-% IV SOLN
INTRAVENOUS | Status: AC
  Filled 2024-04-06: qty 100

## 2024-04-06 MED ORDER — PROPOFOL 1000 MG/100ML IV EMUL
INTRAVENOUS | Status: AC
  Filled 2024-04-06: qty 100

## 2024-04-06 MED ORDER — HYDROMORPHONE HCL 2 MG PO TABS: 2.0000 mg | ORAL_TABLET | Freq: Two times a day (BID) | ORAL | 0 refills | Status: AC | PRN

## 2024-04-06 MED ORDER — CEFAZOLIN SODIUM-DEXTROSE 2-4 GM/100ML-% IV SOLN
2.0000 g | INTRAVENOUS | Status: AC
Start: 2024-04-06 — End: 2024-04-06
  Administered 2024-04-06: 2 g via INTRAVENOUS
  Filled 2024-04-06: qty 100

## 2024-04-06 MED ORDER — LIDOCAINE HCL (PF) 2 % IJ SOLN
INTRAMUSCULAR | Status: DC | PRN
Start: 2024-04-06 — End: 2024-04-06
  Administered 2024-04-06: 40 mg via INTRADERMAL

## 2024-04-06 MED ORDER — ONDANSETRON HCL 4 MG/2ML IJ SOLN
INTRAMUSCULAR | Status: DC | PRN
  Administered 2024-04-06: 4 mg via INTRAVENOUS

## 2024-04-06 MED ORDER — PHENYLEPHRINE HCL-NACL 20-0.9 MG/250ML-% IV SOLN
INTRAVENOUS | Status: DC | PRN
  Administered 2024-04-06: 30 ug/min via INTRAVENOUS

## 2024-04-06 MED ORDER — BUPIVACAINE LIPOSOME 1.3 % IJ SUSP
INTRAMUSCULAR | Status: AC
  Filled 2024-04-06: qty 20

## 2024-04-06 MED ORDER — ORAL CARE MOUTH RINSE: 15.0000 mL | Freq: Once | OROMUCOSAL | Status: AC

## 2024-04-06 MED ORDER — FENTANYL CITRATE (PF) 100 MCG/2ML IJ SOLN
INTRAMUSCULAR | Status: AC
Start: 2024-04-06 — End: 2024-04-06
  Filled 2024-04-06: qty 2

## 2024-04-06 MED ORDER — OXYCODONE HCL 5 MG PO TABS
ORAL_TABLET | ORAL | Status: AC
Start: 2024-04-06 — End: 2024-04-06
  Filled 2024-04-06: qty 1

## 2024-04-06 MED ORDER — BUPIVACAINE LIPOSOME 1.3 % IJ SUSP: 20.0000 mL | Freq: Once | INTRAMUSCULAR | Status: DC

## 2024-04-06 MED ORDER — LIDOCAINE HCL (PF) 2 % IJ SOLN
INTRAMUSCULAR | Status: AC
  Filled 2024-04-06: qty 5

## 2024-04-06 MED ORDER — ACETAMINOPHEN 500 MG PO TABS
1000.0000 mg | ORAL_TABLET | Freq: Once | ORAL | Status: AC
  Administered 2024-04-06: 1000 mg via ORAL
  Filled 2024-04-06: qty 2

## 2024-04-06 MED ORDER — AMISULPRIDE (ANTIEMETIC) 5 MG/2ML IV SOLN: 10.0000 mg | Freq: Once | INTRAVENOUS | Status: DC | PRN

## 2024-04-06 MED ORDER — CHLORHEXIDINE GLUCONATE 0.12 % MT SOLN
15.0000 mL | Freq: Once | OROMUCOSAL | Status: AC
  Administered 2024-04-06: 15 mL via OROMUCOSAL

## 2024-04-06 MED ORDER — KETAMINE HCL 10 MG/ML IJ SOLN
INTRAMUSCULAR | Status: DC | PRN
Start: 2024-04-06 — End: 2024-04-06
  Administered 2024-04-06: 20 mg via INTRAVENOUS
  Administered 2024-04-06: 10 mg via INTRAVENOUS

## 2024-04-06 MED ORDER — PHENYLEPHRINE HCL-NACL 20-0.9 MG/250ML-% IV SOLN
INTRAVENOUS | Status: AC
  Filled 2024-04-06: qty 250

## 2024-04-06 MED ORDER — OXYCODONE HCL 5 MG PO TABS
10.0000 mg | ORAL_TABLET | Freq: Three times a day (TID) | ORAL | Status: DC | PRN
  Administered 2024-04-06: 10 mg via ORAL

## 2024-04-06 MED ORDER — BUPIVACAINE IN DEXTROSE 0.75-8.25 % IT SOLN
INTRATHECAL | Status: DC | PRN
  Administered 2024-04-06: 1.6 mL via INTRATHECAL

## 2024-04-06 MED ORDER — FENTANYL CITRATE PF 50 MCG/ML IJ SOSY
50.0000 ug | PREFILLED_SYRINGE | INTRAMUSCULAR | Status: DC
  Administered 2024-04-06: 50 ug via INTRAVENOUS
  Filled 2024-04-06: qty 2

## 2024-04-06 MED ORDER — 0.9 % SODIUM CHLORIDE (POUR BTL) OPTIME
TOPICAL | Status: DC | PRN
  Administered 2024-04-06: 1000 mL

## 2024-04-06 MED ORDER — ASPIRIN 81 MG PO TBEC: 81.0000 mg | DELAYED_RELEASE_TABLET | Freq: Two times a day (BID) | ORAL | 0 refills | Status: AC

## 2024-04-06 MED ORDER — FENTANYL CITRATE PF 50 MCG/ML IJ SOSY: 25.0000 ug | PREFILLED_SYRINGE | INTRAMUSCULAR | Status: DC | PRN

## 2024-04-06 SURGICAL SUPPLY — 47 items
ATTUNE PS FEM RT SZ 4 CEM KNEE (Femur) IMPLANT
ATTUNE PSRP INSR SZ4 8 KNEE (Insert) IMPLANT
BAG COUNTER SPONGE SURGICOUNT (BAG) IMPLANT
BAG ZIPLOCK 12X15 (MISCELLANEOUS) ×1 IMPLANT
BASEPLATE TIBIAL ROTATING SZ 4 (Knees) IMPLANT
BLADE SAG 18X100X1.27 (BLADE) ×1 IMPLANT
BLADE SAW SGTL 13X75X1.27 (BLADE) ×1 IMPLANT
BNDG ELASTIC 6X10 VLCR STRL LF (GAUZE/BANDAGES/DRESSINGS) ×1 IMPLANT
BNDG GAUZE DERMACEA FLUFF 4 (GAUZE/BANDAGES/DRESSINGS) ×1 IMPLANT
BOWL SMART MIX CTS (DISPOSABLE) ×1 IMPLANT
CEMENT HV SMART SET (Cement) ×2 IMPLANT
COVER SURGICAL LIGHT HANDLE (MISCELLANEOUS) ×1 IMPLANT
CUFF TRNQT CYL 34X4.125X (TOURNIQUET CUFF) ×1 IMPLANT
DRAPE SHEET LG 3/4 BI-LAMINATE (DRAPES) ×1 IMPLANT
DRAPE U-SHAPE 47X51 STRL (DRAPES) ×1 IMPLANT
DRSG ADAPTIC 3X8 NADH LF (GAUZE/BANDAGES/DRESSINGS) ×1 IMPLANT
DURAPREP 26ML APPLICATOR (WOUND CARE) ×1 IMPLANT
ELECT PENCIL ROCKER SW 15FT (MISCELLANEOUS) ×1 IMPLANT
ELECT REM PT RETURN 15FT ADLT (MISCELLANEOUS) ×1 IMPLANT
GAUZE PAD ABD 8X10 STRL (GAUZE/BANDAGES/DRESSINGS) ×1 IMPLANT
GAUZE SPONGE 4X4 12PLY STRL (GAUZE/BANDAGES/DRESSINGS) ×1 IMPLANT
GLOVE BIOGEL PI IND STRL 7.5 (GLOVE) ×1 IMPLANT
GLOVE BIOGEL PI IND STRL 8.5 (GLOVE) ×1 IMPLANT
GLOVE ORTHO TXT STRL SZ7.5 (GLOVE) ×1 IMPLANT
GLOVE SURG ORTHO 8.5 STRL (GLOVE) ×1 IMPLANT
GOWN STRL REUS W/ TWL XL LVL3 (GOWN DISPOSABLE) ×2 IMPLANT
HOOD PEEL AWAY T7 (MISCELLANEOUS) IMPLANT
IMMOBILIZER KNEE 20 THIGH 36 (SOFTGOODS) ×1 IMPLANT
KIT TURNOVER KIT A (KITS) ×1 IMPLANT
MANIFOLD NEPTUNE II (INSTRUMENTS) ×1 IMPLANT
NS IRRIG 1000ML POUR BTL (IV SOLUTION) ×1 IMPLANT
PACK TOTAL KNEE CUSTOM (KITS) ×1 IMPLANT
PATELLA MEDIAL ATTUN 35MM KNEE (Knees) IMPLANT
PIN STEINMAN FIXATION KNEE (PIN) IMPLANT
PROTECTOR NERVE ULNAR (MISCELLANEOUS) ×1 IMPLANT
SET HNDPC FAN SPRY TIP SCT (DISPOSABLE) ×1 IMPLANT
SHIELD FACE FULL FLUID (MISCELLANEOUS) IMPLANT
STAPLER SKIN PROX 35W (STAPLE) IMPLANT
STRIP CLOSURE SKIN 1/2X4 (GAUZE/BANDAGES/DRESSINGS) ×2 IMPLANT
SUT MNCRL AB 3-0 PS2 18 (SUTURE) ×1 IMPLANT
SUT VIC AB 0 CT1 36 (SUTURE) ×1 IMPLANT
SUT VIC AB 1 CT1 36 (SUTURE) ×2 IMPLANT
SUT VIC AB 2-0 CT1 TAPERPNT 27 (SUTURE) ×1 IMPLANT
TOWEL GREEN STERILE FF (TOWEL DISPOSABLE) ×1 IMPLANT
TRAY CATH INTERMITTENT SS 16FR (CATHETERS) ×1 IMPLANT
WATER STERILE IRR 1000ML POUR (IV SOLUTION) ×2 IMPLANT
YANKAUER SUCT BULB TIP NO VENT (SUCTIONS) ×1 IMPLANT

## 2024-04-06 NOTE — Discharge Instructions (Signed)
 Ice to the knee constantly.  Keep the dressing on through the weekend, changing to the Aquacel on Monday. You may shower after that gel bandage goes on as long as it seals well and is water proof.  Remove that gel bandage after one week from surgery and then you may leave the incision open to air.   Do exercise as instructed every hour, please to prevent stiffness.    DO NOT prop anything under the knee, it will make your knee stiff.  Prop under the ankle to encourage your knee to go straight.   Use the walker while you are up and around for balance.  Wear your support stockings 24/7 to prevent blood clots and take baby aspirin(coated) twice daily for 30 days also to prevent blood clots. Start the aspirin tonight!   Follow up with Dr Kay in two weeks in the office, call [phone] for appt  Please call Dr Kay (cell) [phone] with any questions or concerns  INSTRUCTIONS AFTER JOINT REPLACEMENT   Remove items at home which could result in a fall. This includes throw rugs or furniture in walking pathways ICE to the affected joint every three hours while awake for 30 minutes at a time, for at least the first 3-5 days, and then as needed for pain and swelling.  Continue to use ice for pain and swelling. You may notice swelling that will progress down to the foot and ankle.  This is normal after surgery.  Elevate your leg when you are not up walking on it.   Continue to use the breathing machine you got in the hospital (incentive spirometer) which will help keep your temperature down.  It is common for your temperature to cycle up and down following surgery, especially at night when you are not up moving around and exerting yourself.  The breathing machine keeps your lungs expanded and your temperature down.   DIET:  As you were doing prior to hospitalization, we recommend a well-balanced diet.  DRESSING / WOUND CARE / SHOWERING  You may shower 3 days after surgery, but keep the wounds  dry during showering.  You may use an occlusive plastic wrap (Press'n Seal for example), NO SOAKING/SUBMERGING IN THE BATHTUB.  If the bandage gets wet, change with a clean dry gauze.  If the incision gets wet, pat the wound dry with a clean towel. Make sure the gel bandage is on before showering.   ACTIVITY  Increase activity slowly as tolerated, but follow the weight bearing instructions below.   No driving for 6 weeks or until further direction given by your physician.  You cannot drive while taking narcotics.  No lifting or carrying greater than 10 lbs. until further directed by your surgeon. Avoid periods of inactivity such as sitting longer than an hour when not asleep. This helps prevent blood clots.  You may return to work once you are authorized by your doctor.     WEIGHT BEARING   Weight bearing as tolerated with assist device (walker, cane, etc) as directed, use it as long as suggested by your surgeon or therapist, typically at least 4-6 weeks.   EXERCISES  Results after joint replacement surgery are often greatly improved when you follow the exercise, range of motion and muscle strengthening exercises prescribed by your doctor. Safety measures are also important to protect the joint from further injury. Any time any of these exercises cause you to have increased pain or swelling, decrease what you are doing  until you are comfortable again and then slowly increase them. If you have problems or questions, call your caregiver or physical therapist for advice.   Rehabilitation is important following a joint replacement. After just a few days of immobilization, the muscles of the leg can become weakened and shrink (atrophy).  These exercises are designed to build up the tone and strength of the thigh and leg muscles and to improve motion. Often times heat used for twenty to thirty minutes before working out will loosen up your tissues and help with improving the range of motion but do not  use heat for the first two weeks following surgery (sometimes heat can increase post-operative swelling).   These exercises can be done on a training (exercise) mat, on the floor, on a table or on a bed. Use whatever works the best and is most comfortable for you.    Use music or television while you are exercising so that the exercises are a pleasant break in your day. This will make your life better with the exercises acting as a break in your routine that you can look forward to.   Perform all exercises about fifteen times, three times per day or as directed.  You should exercise both the operative leg and the other leg as well.  Exercises include:   Quad Sets - Tighten up the muscle on the front of the thigh (Quad) and hold for 5-10 seconds.   Straight Leg Raises - With your knee straight (if you were given a brace, keep it on), lift the leg to 60 degrees, hold for 3 seconds, and slowly lower the leg.  Perform this exercise against resistance later as your leg gets stronger.  Leg Slides: Lying on your back, slowly slide your foot toward your buttocks, bending your knee up off the floor (only go as far as is comfortable). Then slowly slide your foot back down until your leg is flat on the floor again.  Angel Wings: Lying on your back spread your legs to the side as far apart as you can without causing discomfort.  Hamstring Strength:  Lying on your back, push your heel against the floor with your leg straight by tightening up the muscles of your buttocks.  Repeat, but this time bend your knee to a comfortable angle, and push your heel against the floor.  You may put a pillow under the heel to make it more comfortable if necessary.   A rehabilitation program following joint replacement surgery can speed recovery and prevent re-injury in the future due to weakened muscles. Contact your doctor or a physical therapist for more information on knee rehabilitation.    CONSTIPATION  Constipation is  defined medically as fewer than three stools per week and severe constipation as less than one stool per week.  Even if you have a regular bowel pattern at home, your normal regimen is likely to be disrupted due to multiple reasons following surgery.  Combination of anesthesia, postoperative narcotics, change in appetite and fluid intake all can affect your bowels.   YOU MUST use at least one of the following options; they are listed in order of increasing strength to get the job done.  They are all available over the counter, and you may need to use some, POSSIBLY even all of these options:    Drink plenty of fluids (prune juice may be helpful) and high fiber foods Colace 100 mg by mouth twice a day  Senokot for constipation as directed  and as needed Dulcolax (bisacodyl), take with full glass of water  Miralax (polyethylene glycol) once or twice a day as needed.  If you have tried all these things and are unable to have a bowel movement in the first 3-4 days after surgery call either your surgeon or your primary doctor.    If you experience loose stools or diarrhea, hold the medications until you stool forms back up.  If your symptoms do not get better within 1 week or if they get worse, check with your doctor.  If you experience the worst abdominal pain ever or develop nausea or vomiting, please contact the office immediately for further recommendations for treatment.   ITCHING:  If you experience itching with your medications, try taking only a single pain pill, or even half a pain pill at a time.  You can also use Benadryl over the counter for itching or also to help with sleep.   TED HOSE STOCKINGS:  Use stockings on both legs until for at least 2 weeks or as directed by physician office. They may be removed at night for sleeping.  MEDICATIONS:  See your medication summary on the "After Visit Summary" that nursing will review with you.  You may have some home medications which will be placed  on hold until you complete the course of blood thinner medication.  It is important for you to complete the blood thinner medication as prescribed.  PRECAUTIONS:  If you experience chest pain or shortness of breath - call 911 immediately for transfer to the hospital emergency department.   If you develop a fever greater that 101 F, purulent drainage from wound, increased redness or drainage from wound, foul odor from the wound/dressing, or calf pain - CONTACT YOUR SURGEON.                                                   FOLLOW-UP APPOINTMENTS:  If you do not already have a post-op appointment, please call the office for an appointment to be seen by your surgeon.  Guidelines for how soon to be seen are listed in your "After Visit Summary", but are typically between 1-4 weeks after surgery.  OTHER INSTRUCTIONS:   Knee Replacement:  Do not place pillow under knee, focus on keeping the knee straight while resting. CPM instructions: 0-90 degrees, 2 hours in the morning, 2 hours in the afternoon, and 2 hours in the evening. Place foam block, curve side up under heel at all times except when in CPM or when walking.  DO NOT modify, tear, cut, or change the foam block in any way.  POST-OPERATIVE OPIOID TAPER INSTRUCTIONS: It is important to wean off of your opioid medication as soon as possible. If you do not need pain medication after your surgery it is ok to stop day one. Opioids include: Codeine, Hydrocodone (Norco, Vicodin), Oxycodone (Percocet, oxycontin ) and hydromorphone  amongst others.  Long term and even short term use of opiods can cause: Increased pain response Dependence Constipation Depression Respiratory depression And more.  Withdrawal symptoms can include Flu like symptoms Nausea, vomiting And more Techniques to manage these symptoms Hydrate well Eat regular healthy meals Stay active Use relaxation techniques(deep breathing, meditating, yoga) Do Not substitute Alcohol to help  with tapering If you have been on opioids for less than two weeks and do not have pain than  it is ok to stop all together.  Plan to wean off of opioids This plan should start within one week post op of your joint replacement. Maintain the same interval or time between taking each dose and first decrease the dose.  Cut the total daily intake of opioids by one tablet each day Next start to increase the time between doses. The last dose that should be eliminated is the evening dose.   MAKE SURE YOU:  Understand these instructions.  Get help right away if you are not doing well or get worse.    Thank you for letting us  be a part of your medical care team.  It is a privilege we respect greatly.  We hope these instructions will help you stay on track for a fast and full recovery!

## 2024-04-06 NOTE — Interval H&P Note (Signed)
 History and Physical Interval Note:  04/06/2024 7:23 AM  [name]  has presented today for surgery, with the diagnosis of Arthritis of right knee.  The various methods of treatment have been discussed with the patient and family. After consideration of risks, benefits and other options for treatment, the patient has consented to  Procedure(s): ARTHROPLASTY, KNEE, TOTAL (Right) as a surgical intervention.  The patient's history has been reviewed, patient examined, no change in status, stable for surgery.  I have reviewed the patient's chart and labs.  Questions were answered to the patient's satisfaction.     Elspeth JONELLE Her

## 2024-04-06 NOTE — Op Note (Signed)
 NAME: [name], [name] MEDICAL RECORD NO: [id number] ACCOUNT NO: [id number] DATE OF BIRTH: Jun 15, 1971 FACILITY: [facility] LOCATION: [facility] PHYSICIAN: Elspeth SAUNDERS. Kay, MD  Operative Report   DATE OF PROCEDURE: 04/06/2024  PREOPERATIVE DIAGNOSIS:  Right knee end-stage arthritis.  POSTOPERATIVE DIAGNOSIS:  Right knee end-stage arthritis.  PROCEDURE PERFORMED:  Right total knee arthroplasty using Depuy Attune prosthesis.  ATTENDING SURGEON:  Elspeth SAUNDERS. Kay, MD  ASSISTANT:  Debby Crock [place], [place], who was scrubbed during the entire procedure, and necessary for satisfactory completion of surgery.  ANESTHESIA:  Spinal anesthesia plus adductor canal block was utilized.  ESTIMATED BLOOD LOSS:  Minimal.  FLUID REPLACEMENT:  1000 mL of crystalloid.  COUNTS:  Instrument count was correct.  COMPLICATIONS:  There were no complications.  ANTIBIOTICS:  Perioperative antibiotics were given.  TOURNIQUET TIME:  67 minutes at 300 mmHg.  INDICATIONS:  The patient is a 53 year old female with worsening right knee pain due to end-stage arthritis.  The patient has had a failure of conservative management over an extended period of time and presents for operative treatment to eliminate pain  and to restore function.  Informed consent was obtained.  DESCRIPTION OF PROCEDURE: After an adequate level of spinal anesthesia was achieved plus the adductor canal block, the patient was positioned supine on the operating room table.  A nonsterile tourniquet was placed on the proximal thigh.  Sterile prep and  drape of the right leg performed.  Timeout called verifying correct patient and correct site.  We elevated the leg and exsanguinated with an Esmarch bandage, inflating the tourniquet to 300 mmHg.  We then placed the knee in flexion and performed a  longitudinal midline incision with a 10 blade scalpel.  Dissection down through subcutaneous tissues with the blade, we then used a fresh 10 blade for the medial  parapatellar arthrotomy.  We divided the lateral patellofemoral ligaments, everting the  patella and exposing the distal femur.  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  We entered the distal femur with a step-cut drill.  We then placed an intramedullary guide and resected 9 mm off the  distal femur, set on 5 degrees of valgus.  The patient did have noted probably 7 to 10 degrees of hyperextension on the table.  With her distal femoral cut done, we sized her femur to size 4 anterior down, performing anterior and posterior chamfer cuts  with a 4-in-1 block.  Next, we removed ACL/PCL meniscal tissue, subluxing the tibia anteriorly.  We then performed our tibia cut with the external jig resecting 2 mm off the affected medial side and we were perpendicular to the long axis of the tibia  with minimal posterior slope.  Once we had our tibial cut done, we used a lamina spreader and removed posterior chondral osteophytes.  We injected the posterior capsule with a combination of Marcaine , Exparel  and saline, a total of 10 mg.  Next, we went  ahead and checked our gaps, which were symmetric at 6 mm.  We then removed our tibia pins and completed our tibia preparation for the 4 tibia.  We placed the mid portion of the tibial component over the medial one-third of the tibial tubercle for  patellar tracking purposes.  Once we had our tibial preparation done with the modular drill and keel punch and tibial trial in place, we cut our box for 4 right femur.  We then drilled our lug holes.  We placed a 6 mm poly trial  and reduced the knee.  We  were happy with our soft tissue balancing and our ability to get to full extension.  We then went ahead and resurfaced the patella going from 24 mm thickness down to 14 mm thickness with the oscillating saw using a patellar cutting jig.  Next, we  drilled lug holes for the 35 patellar button and trialed with a 35 trial.  We ranged the  knee and had excellent patellar tracking and no-touch technique.  We removed all trial components, irrigated thoroughly, dried the bone well, and vacuum mixed high  viscosity cement on the back table.  We cemented the components into place, 4 tibia, 4 right femur with a 6-mm poly trial in place, placed the knee in extension for compression of the cement while the cement set up.  We also had the patellar clamp  holding the patellar compressed while the cement set up.  Once all cement was hardened on the back table, we removed excess cement with a quarter inch curved osteotome inspecting throughout the knee.  We then selected the size 4 8 mm trial and trialed  that and we were able to get the extension and very stable in flexion.  So, we selected the real size 4 8-mm poly.  After we pulse irrigated the knee, we placed that on the tibial tray and reduced the knee.  A nice little pop as the medial condyle were  reduced.  We were able to get full extension and excellent patellar tracking.  We then injected the anterior capsule with a combination of Marcaine , Exparel  and saline.  Again, a total of 10 mg of Exparel  was utilized.  We then repaired the parapatellar  arthrotomy with #1 Vicryl suture followed by 2-0 Vicryl for subcutaneous closure and 4-0 Monocryl for skin.  Steri-Strips were applied followed by a sterile dressing.  The patient tolerated the surgery well.  Closure and staples for skin as the patient  was allergic to Steri-Strips.  Once we had the staple closure done, we applied a sterile bandage and compression from the toes to the thigh.  The patient was transported to the recovery room in stable condition having tolerated surgery well.   PUS D: 04/06/2024 11:34:02 am T: 04/06/2024 1:47:00 pm  JOB: [id number]/ [id number]

## 2024-04-06 NOTE — Transfer of Care (Signed)
 22Immediate Anesthesia Transfer of Care Note  Patient: [name]  Procedure(s) Performed: ARTHROPLASTY, KNEE, TOTAL (Right: Knee)  Patient Location: PACU  Anesthesia Type:Spinal  Level of Consciousness: awake, alert , oriented, and patient cooperative  Airway & Oxygen Therapy: Patient Spontanous Breathing and Patient connected to face mask oxygen  Post-op Assessment: Report given to RN and Post -op Vital signs reviewed and stable  Post vital signs: Reviewed and stable  Last Vitals:  Vitals Value Taken Time  BP 102/64 04/06/24 11:37  Temp    Pulse 85 04/06/24 11:40  Resp 17 04/06/24 11:40  SpO2 100 % 04/06/24 11:40  Vitals shown include unfiled device data.  Last Pain:  Vitals:   04/06/24 0734  TempSrc:   PainSc: 9          Complications: No notable events documented.

## 2024-04-06 NOTE — Anesthesia Procedure Notes (Signed)
 Spinal  Staffing Performed by: Kathern Rollene LABOR, CRNA Authorized by: Corinne Garnette BRAVO, MD

## 2024-04-06 NOTE — Progress Notes (Signed)
 Orthopedic Tech Progress Note Patient Details:  [name] 1970/12/23 [id number]  Ortho Devices Type of Ortho Device: Bone foam zero knee Ortho Device/Splint Location: RLE Ortho Device/Splint Interventions: Application   Post Interventions Patient Tolerated: Well  Massie BRAVO [name] 04/06/2024, 12:14 PM

## 2024-04-06 NOTE — Anesthesia Procedure Notes (Signed)
 Spinal  Patient location during procedure: OR Start time: 04/06/2024 9:41 AM End time: 04/06/2024 9:46 AM Reason for block: surgical anesthesia Staffing Performed: resident/CRNA  Resident/CRNA: Kathern Rollene LABOR, CRNA Performed by: Kathern Rollene LABOR, CRNA Authorized by: Corinne Garnette BRAVO, MD   Preanesthetic Checklist Completed: patient identified, IV checked, site marked, risks and benefits discussed, surgical consent, monitors and equipment checked, pre-op evaluation and timeout performed Spinal Block Patient position: sitting Prep: DuraPrep Patient monitoring: heart rate, continuous pulse ox and blood pressure Location: L3-4 Injection technique: single-shot Needle Needle type: Pencan  Needle gauge: 24 G Needle length: 9 cm Needle insertion depth: 5 cm Assessment Sensory level: T8 Events: CSF return Additional Notes

## 2024-04-06 NOTE — Evaluation (Signed)
 Physical Therapy Evaluation Patient Details Name: [name] MRN: [id number] DOB: [birth year] Today's Date: 04/06/2024  History of Present Illness  53 yo female presents to therapy s/p R TKA on 04/06/2024 due to failure of conservative measures. Pt PMH includes but is not limited to: anxiety, asthma, bipolar disorder, schizophrenia, OSA on CPAP, LBP s/p fusion, depression, eczema, HTN, reflux, HA, OCD, narcolepsy, OA, anemia, RLS, B shoulder surgery, and R ankle fusion.  Clinical Impression   [name] is a 53 y.o. female POD 0 s/p R TKA. Patient reports IND with mobility at baseline. Patient is now limited by functional impairments (see PT problem list below) and requires CGA and cues for transfers and gait with RW. Patient was able to ambulate 55 feet with RW and CGA progressing to close S and cues for safe walker management. Patient educated on safe sequencing for stair mobility with L handrail and SPC, fall risk prevention, slowly increasing activity, pain management and goal, use of ice man machine, positioning with towel roll under R ankle when seated in recliner to facilitate extension and SUV transfers with running board pt and sister verbalized safe guarding position for people assisting with mobility. Patient instructed in exercises to facilitate ROM and circulation reviewed and HO provided. Patient will benefit from continued skilled PT interventions to address impairments and progress towards PLOF. Patient has met mobility goals at adequate level for discharge home with family support and OPPT services scheduled for 8/4; will continue to follow if pt continues acute stay to progress towards Mod I goals.       If plan is discharge home, recommend the following: A little help with walking and/or transfers;A little help with bathing/dressing/bathroom;Assistance with cooking/housework;Assist for transportation;Help with stairs or ramp for entrance   Can travel by private  vehicle        Equipment Recommendations Rolling walker (2 wheels)  Recommendations for Other Services       Functional Status Assessment Patient has had a recent decline in their functional status and demonstrates the ability to make significant improvements in function in a reasonable and predictable amount of time.     Precautions / Restrictions Precautions Precautions: Fall;Knee Restrictions Weight Bearing Restrictions Per Provider Order: No      Mobility  Bed Mobility Overal bed mobility: Needs Assistance Bed Mobility: Supine to Sit     Supine to sit: Supervision     General bed mobility comments: min cues    Transfers Overall transfer level: Needs assistance Equipment used: Rolling walker (2 wheels) Transfers: Sit to/from Stand, Bed to chair/wheelchair/BSC Sit to Stand: Contact guard assist Stand pivot transfers: Contact guard assist         General transfer comment: min cues for proper UE placement with transfer tasks sit to stand from bed and recliner and SPT bed to [facility]    Ambulation/Gait Ambulation/Gait assistance: Contact guard assist, Supervision Gait Distance (Feet): 55 Feet Assistive device: Rolling walker (2 wheels) Gait Pattern/deviations: Step-to pattern, Decreased stance time - right, Antalgic, Trunk flexed Gait velocity: decreased     General Gait Details: slight trunk flexion with B UE support at RW to offload R LE in stance phase,  min cues for RW management , step to pattern  Stairs Stairs: Yes Stairs assistance: Contact guard assist Stair Management: One rail Left, Two rails Number of Stairs: 3 General stair comments: step navigation instruction initiated with B handrails 2 steps and cues for safety, sequencing and technique pt able to progress to 3  steps with L handrail and SPC with CGA and demonstrating appropriate step to pattern  Wheelchair Mobility     Tilt Bed    Modified Rankin (Stroke Patients Only)       Balance  Overall balance assessment: Needs assistance Sitting-balance support: Feet supported Sitting balance-Leahy Scale: Good     Standing balance support: Bilateral upper extremity supported, During functional activity, Reliant on assistive device for balance Standing balance-Leahy Scale: Fair Standing balance comment: static standing no UE support                             Pertinent Vitals/Pain Pain Assessment Pain Assessment: 0-10 Pain Score: 7  Pain Location: R knee, incision line and LE Pain Descriptors / Indicators: Burning, Discomfort, Grimacing, Operative site guarding Pain Intervention(s): Limited activity within patient's tolerance, Monitored during session, Premedicated before session, Repositioned, Ice applied    Home Living Family/patient expects to be discharged to:: Private residence Living Arrangements: Children Available Help at Discharge: Family Type of Home: House Home Access: Level entry     Alternate Level Stairs-Number of Steps: flight/15 Home Layout: Two level;Bed/bath upstairs Home Equipment: Cane - single point      Prior Function Prior Level of Function : Independent/Modified Independent             Mobility Comments: IND no AD for all ADLs, self care tasks and IADL       Extremity/Trunk Assessment        Lower Extremity Assessment Lower Extremity Assessment: RLE deficits/detail RLE Deficits / Details: ankle DF/PF 5/5; SLR < 10 degree lag RLE Sensation: WNL    Cervical / Trunk Assessment Cervical / Trunk Assessment: Back Surgery  Communication   Communication Communication: No apparent difficulties    Cognition Arousal: Alert Behavior During Therapy: WFL for tasks assessed/performed   PT - Cognitive impairments: No apparent impairments                         Following commands: Intact       Cueing       General Comments      Exercises Total Joint Exercises Ankle Circles/Pumps: AROM, Both, 10  reps Quad Sets: AROM, Right, 5 reps Short Arc Quad: AROM, Right, 5 reps Heel Slides: AROM, Right, 5 reps Hip ABduction/ADduction: AROM, Right, 5 reps Straight Leg Raises: AROM, Right, 5 reps Knee Flexion: AROM, Right, 5 reps, Seated   Assessment/Plan    PT Assessment Patient needs continued PT services  PT Problem List Decreased strength;Decreased range of motion;Decreased activity tolerance;Decreased balance;Decreased mobility;Decreased coordination;Pain       PT Treatment Interventions DME instruction;Gait training;Stair training;Functional mobility training;Therapeutic activities;Therapeutic exercise;Balance training;Neuromuscular re-education;Patient/family education;Modalities    PT Goals (Current goals can be found in the Care Plan section)  Acute Rehab PT Goals Patient Stated Goal: go to the grocery store, play with my grandchild and garden PT Goal Formulation: With patient Time For Goal Achievement: 04/20/24 Potential to Achieve Goals: Good    Frequency 7X/week     Co-evaluation               AM-PAC PT 6 Clicks Mobility  Outcome Measure Help needed turning from your back to your side while in a flat bed without using bedrails?: None Help needed moving from lying on your back to sitting on the side of a flat bed without using bedrails?: A Little Help needed moving to and from a  bed to a chair (including a wheelchair)?: A Little Help needed standing up from a chair using your arms (e.g., wheelchair or bedside chair)?: A Little Help needed to walk in hospital room?: A Little Help needed climbing 3-5 steps with a railing? : A Little 6 Click Score: 19    End of Session Equipment Utilized During Treatment: Gait belt Activity Tolerance: Patient tolerated treatment well;No increased pain Patient left: in chair;with call bell/phone within reach;with family/visitor present Nurse Communication: Mobility status;Other (comment) (pt readiness for d/c from PT  standpoint) PT Visit Diagnosis: Unsteadiness on feet (R26.81);Other abnormalities of gait and mobility (R26.89);Muscle weakness (generalized) (M62.81);Pain;Difficulty in walking, not elsewhere classified (R26.2) Pain - Right/Left: Right Pain - part of body: Knee;Leg    Time: 1450-1544 PT Time Calculation (min) (ACUTE ONLY): 54 min   Charges:   PT Evaluation $PT Eval Low Complexity: 1 Low PT Treatments $Gait Training: 8-22 mins $Therapeutic Exercise: 8-22 mins $Therapeutic Activity: 8-22 mins PT General Charges $$ ACUTE PT VISIT: 1 Visit         [place], PT Acute Rehab   [place] VEAR Drone 04/06/2024, 5:19 PM

## 2024-04-06 NOTE — Anesthesia Procedure Notes (Signed)
 Anesthesia Regional Block: Adductor canal block   Pre-Anesthetic Checklist: , timeout performed,  Correct Patient, Correct Site, Correct Laterality,  Correct Procedure, Correct Position, site marked,  Risks and benefits discussed,  Surgical consent,  Pre-op evaluation,  At surgeon's request and post-op pain management  Laterality: Right  Prep: chloraprep       Needles:  Injection technique: Single-shot  Needle Type: Echogenic Needle     Needle Length: 9cm  Needle Gauge: 21     Additional Needles:   Procedures:,,,, ultrasound used (permanent image in chart),,    Narrative:  Start time: 04/06/2024 8:45 AM End time: 04/06/2024 8:52 AM Injection made incrementally with aspirations every 5 mL.  Performed by: Personally  Anesthesiologist: Corinne Garnette BRAVO, MD  Additional Notes: No pain on injection. No increased resistance to injection. Injection made in 5cc increments.  Good needle visualization.  Patient tolerated procedure well.

## 2024-04-06 NOTE — Anesthesia Preprocedure Evaluation (Addendum)
 Anesthesia Evaluation  Patient identified by MRN, date of birth, ID band Patient awake    Reviewed: Allergy  & Precautions, NPO status , Patient's Chart, lab work & pertinent test results  Airway Mallampati: II  TM Distance: >3 FB Neck ROM: Full    Dental  (+) Dental Advisory Given, Missing   Pulmonary asthma , sleep apnea and Continuous Positive Airway Pressure Ventilation    Pulmonary exam normal breath sounds clear to auscultation       Cardiovascular hypertension, Pt. on medications (-) angina (-) Past MI Normal cardiovascular exam Rhythm:Regular Rate:Normal     Neuro/Psych  Headaches PSYCHIATRIC DISORDERS Anxiety Depression Bipolar Disorder Schizophrenia  S/p L4-5 fusion    GI/Hepatic Neg liver ROS,GERD  Medicated and Controlled,,Gastroparesis    Endo/Other  diabetes, Type 2  Obesity   Renal/GU negative Renal ROS Bladder dysfunction      Musculoskeletal  (+) Arthritis , Osteoarthritis,    Abdominal   Peds  Hematology negative hematology ROS (+) Plt 388k   Anesthesia Other Findings Day of surgery medications reviewed with the patient.  Reproductive/Obstetrics                              Anesthesia Physical Anesthesia Plan  ASA: 3  Anesthesia Plan: Spinal   Post-op Pain Management: Tylenol  PO (pre-op)* and Toradol  IV (intra-op)*   Induction: Intravenous  PONV Risk Score and Plan: 2 and Midazolam , TIVA, Dexamethasone  and Ondansetron   Airway Management Planned: Natural Airway and Simple Face Mask  Additional Equipment:   Intra-op Plan:   Post-operative Plan:   Informed Consent: I have reviewed the patients History and Physical, chart, labs and discussed the procedure including the risks, benefits and alternatives for the proposed anesthesia with the patient or authorized representative who has indicated his/her understanding and acceptance.     Dental advisory  given  Plan Discussed with: CRNA  Anesthesia Plan Comments:          Anesthesia Quick Evaluation

## 2024-04-06 NOTE — Anesthesia Postprocedure Evaluation (Signed)
 Anesthesia Post Note  Patient: [name]  Procedure(s) Performed: ARTHROPLASTY, KNEE, TOTAL (Right: Knee)     Patient location during evaluation: PACU Anesthesia Type: Spinal Level of consciousness: awake, awake and alert and oriented Pain management: pain level controlled Vital Signs Assessment: post-procedure vital signs reviewed and stable Respiratory status: spontaneous breathing, nonlabored ventilation and respiratory function stable Cardiovascular status: blood pressure returned to baseline and stable Postop Assessment: no headache, no backache, spinal receding and no apparent nausea or vomiting Anesthetic complications: no   No notable events documented.  Last Vitals:  Vitals:   04/06/24 1430 04/06/24 1530  BP: 131/84 (!) 127/95  Pulse: 78 78  Resp:  14  Temp:  36.5 C  SpO2: 96% 97%    Last Pain:  Vitals:   04/06/24 1530  TempSrc:   PainSc: 0-No pain                 Garnette FORBES Skillern

## 2024-04-06 NOTE — Brief Op Note (Signed)
 04/06/2024  11:28 AM  PATIENT:  [name]  53 y.o. female  PRE-OPERATIVE DIAGNOSIS:  Arthritis of right knee, end stage  POST-OPERATIVE DIAGNOSIS:  Arthritis of right knee, end stage  PROCEDURE:  Procedure(s): ARTHROPLASTY, KNEE, TOTAL (Right) DePuy Attune   SURGEON:  Surgeons and Role:    DEWAINE Kay Kemps, MD - Primary  PHYSICIAN ASSISTANT:   ASSISTANTS: Debby KATHEE Fireman, PA-C   ANESTHESIA:   regional and spinal  EBL:  30 mL   BLOOD ADMINISTERED:none  DRAINS: none   LOCAL MEDICATIONS USED:  MARCAINE      SPECIMEN:  No Specimen  DISPOSITION OF SPECIMEN:  N/A  COUNTS:  YES  TOURNIQUET:   Total Tourniquet Time Documented: Thigh (Right) - 73 minutes Total: Thigh (Right) - 73 minutes   DICTATION: .Other Dictation: Dictation Number [id number]  PLAN OF CARE: Discharge to home after PACU  PATIENT DISPOSITION:  PACU - hemodynamically stable.   Delay start of Pharmacological VTE agent (>24hrs) due to surgical blood loss or risk of bleeding: no

## 2024-04-09 ENCOUNTER — Encounter (HOSPITAL_COMMUNITY): Payer: Self-pay | Admitting: Orthopedic Surgery

## 2024-04-09 DIAGNOSIS — R269 Unspecified abnormalities of gait and mobility: Secondary | ICD-10-CM | POA: Diagnosis not present

## 2024-04-09 DIAGNOSIS — G8929 Other chronic pain: Secondary | ICD-10-CM | POA: Diagnosis not present

## 2024-04-09 DIAGNOSIS — M25661 Stiffness of right knee, not elsewhere classified: Secondary | ICD-10-CM | POA: Diagnosis not present

## 2024-04-09 DIAGNOSIS — M25561 Pain in right knee: Secondary | ICD-10-CM | POA: Diagnosis not present

## 2024-04-11 DIAGNOSIS — R269 Unspecified abnormalities of gait and mobility: Secondary | ICD-10-CM | POA: Diagnosis not present

## 2024-04-11 DIAGNOSIS — M25661 Stiffness of right knee, not elsewhere classified: Secondary | ICD-10-CM | POA: Diagnosis not present

## 2024-04-11 DIAGNOSIS — M5459 Other low back pain: Secondary | ICD-10-CM | POA: Diagnosis not present

## 2024-04-12 ENCOUNTER — Ambulatory Visit (INDEPENDENT_AMBULATORY_CARE_PROVIDER_SITE_OTHER)

## 2024-04-12 ENCOUNTER — Encounter (HOSPITAL_BASED_OUTPATIENT_CLINIC_OR_DEPARTMENT_OTHER)

## 2024-04-12 DIAGNOSIS — J309 Allergic rhinitis, unspecified: Secondary | ICD-10-CM | POA: Diagnosis not present

## 2024-04-17 DIAGNOSIS — R269 Unspecified abnormalities of gait and mobility: Secondary | ICD-10-CM | POA: Diagnosis not present

## 2024-04-17 DIAGNOSIS — M25661 Stiffness of right knee, not elsewhere classified: Secondary | ICD-10-CM | POA: Diagnosis not present

## 2024-04-17 DIAGNOSIS — M5459 Other low back pain: Secondary | ICD-10-CM | POA: Diagnosis not present

## 2024-04-19 DIAGNOSIS — M5459 Other low back pain: Secondary | ICD-10-CM | POA: Diagnosis not present

## 2024-04-19 DIAGNOSIS — R269 Unspecified abnormalities of gait and mobility: Secondary | ICD-10-CM | POA: Diagnosis not present

## 2024-04-19 DIAGNOSIS — M25661 Stiffness of right knee, not elsewhere classified: Secondary | ICD-10-CM | POA: Diagnosis not present

## 2024-04-19 DIAGNOSIS — Z4789 Encounter for other orthopedic aftercare: Secondary | ICD-10-CM | POA: Diagnosis not present

## 2024-04-25 DIAGNOSIS — M25661 Stiffness of right knee, not elsewhere classified: Secondary | ICD-10-CM | POA: Diagnosis not present

## 2024-04-25 DIAGNOSIS — G8929 Other chronic pain: Secondary | ICD-10-CM | POA: Diagnosis not present

## 2024-04-25 DIAGNOSIS — M25561 Pain in right knee: Secondary | ICD-10-CM | POA: Diagnosis not present

## 2024-04-25 DIAGNOSIS — M1711 Unilateral primary osteoarthritis, right knee: Secondary | ICD-10-CM | POA: Diagnosis not present

## 2024-04-27 ENCOUNTER — Ambulatory Visit (INDEPENDENT_AMBULATORY_CARE_PROVIDER_SITE_OTHER)

## 2024-04-27 DIAGNOSIS — M25661 Stiffness of right knee, not elsewhere classified: Secondary | ICD-10-CM | POA: Diagnosis not present

## 2024-04-27 DIAGNOSIS — J309 Allergic rhinitis, unspecified: Secondary | ICD-10-CM | POA: Diagnosis not present

## 2024-04-27 DIAGNOSIS — M1711 Unilateral primary osteoarthritis, right knee: Secondary | ICD-10-CM | POA: Diagnosis not present

## 2024-04-27 DIAGNOSIS — M25561 Pain in right knee: Secondary | ICD-10-CM | POA: Diagnosis not present

## 2024-04-27 DIAGNOSIS — G8929 Other chronic pain: Secondary | ICD-10-CM | POA: Diagnosis not present

## 2024-05-03 ENCOUNTER — Ambulatory Visit (INDEPENDENT_AMBULATORY_CARE_PROVIDER_SITE_OTHER)

## 2024-05-03 DIAGNOSIS — M25561 Pain in right knee: Secondary | ICD-10-CM | POA: Diagnosis not present

## 2024-05-03 DIAGNOSIS — J309 Allergic rhinitis, unspecified: Secondary | ICD-10-CM

## 2024-05-03 DIAGNOSIS — M25661 Stiffness of right knee, not elsewhere classified: Secondary | ICD-10-CM | POA: Diagnosis not present

## 2024-05-03 DIAGNOSIS — M1711 Unilateral primary osteoarthritis, right knee: Secondary | ICD-10-CM | POA: Diagnosis not present

## 2024-05-03 DIAGNOSIS — G8929 Other chronic pain: Secondary | ICD-10-CM | POA: Diagnosis not present

## 2024-05-04 DIAGNOSIS — F332 Major depressive disorder, recurrent severe without psychotic features: Secondary | ICD-10-CM | POA: Diagnosis not present

## 2024-05-04 DIAGNOSIS — F411 Generalized anxiety disorder: Secondary | ICD-10-CM | POA: Diagnosis not present

## 2024-05-04 DIAGNOSIS — F4312 Post-traumatic stress disorder, chronic: Secondary | ICD-10-CM | POA: Diagnosis not present

## 2024-05-09 DIAGNOSIS — M25561 Pain in right knee: Secondary | ICD-10-CM | POA: Diagnosis not present

## 2024-05-09 DIAGNOSIS — G8929 Other chronic pain: Secondary | ICD-10-CM | POA: Diagnosis not present

## 2024-05-09 DIAGNOSIS — M1711 Unilateral primary osteoarthritis, right knee: Secondary | ICD-10-CM | POA: Diagnosis not present

## 2024-05-09 DIAGNOSIS — M25661 Stiffness of right knee, not elsewhere classified: Secondary | ICD-10-CM | POA: Diagnosis not present

## 2024-05-11 ENCOUNTER — Ambulatory Visit (INDEPENDENT_AMBULATORY_CARE_PROVIDER_SITE_OTHER)

## 2024-05-11 DIAGNOSIS — J309 Allergic rhinitis, unspecified: Secondary | ICD-10-CM

## 2024-05-11 DIAGNOSIS — G8929 Other chronic pain: Secondary | ICD-10-CM | POA: Diagnosis not present

## 2024-05-11 DIAGNOSIS — M25561 Pain in right knee: Secondary | ICD-10-CM | POA: Diagnosis not present

## 2024-05-11 DIAGNOSIS — M25661 Stiffness of right knee, not elsewhere classified: Secondary | ICD-10-CM | POA: Diagnosis not present

## 2024-05-11 DIAGNOSIS — M1711 Unilateral primary osteoarthritis, right knee: Secondary | ICD-10-CM | POA: Diagnosis not present

## 2024-05-22 ENCOUNTER — Ambulatory Visit (INDEPENDENT_AMBULATORY_CARE_PROVIDER_SITE_OTHER): Admitting: *Deleted

## 2024-05-22 DIAGNOSIS — J309 Allergic rhinitis, unspecified: Secondary | ICD-10-CM

## 2024-06-01 ENCOUNTER — Encounter: Payer: Self-pay | Admitting: Internal Medicine

## 2024-06-01 ENCOUNTER — Other Ambulatory Visit: Payer: Self-pay

## 2024-06-01 ENCOUNTER — Ambulatory Visit: Admitting: Internal Medicine

## 2024-06-01 VITALS — BP 122/78 | HR 93 | Temp 98.0°F | Ht 64.0 in | Wt 195.2 lb

## 2024-06-01 DIAGNOSIS — J4530 Mild persistent asthma, uncomplicated: Secondary | ICD-10-CM | POA: Diagnosis not present

## 2024-06-01 DIAGNOSIS — J3089 Other allergic rhinitis: Secondary | ICD-10-CM | POA: Diagnosis not present

## 2024-06-01 DIAGNOSIS — J302 Other seasonal allergic rhinitis: Secondary | ICD-10-CM | POA: Diagnosis not present

## 2024-06-01 MED ORDER — MONTELUKAST SODIUM 10 MG PO TABS: 10.0000 mg | ORAL_TABLET | Freq: Every day | ORAL | 5 refills | Status: AC

## 2024-06-01 MED ORDER — BUDESONIDE-FORMOTEROL FUMARATE 80-4.5 MCG/ACT IN AERO: INHALATION_SPRAY | RESPIRATORY_TRACT | 5 refills | Status: AC

## 2024-06-01 MED ORDER — IPRATROPIUM BROMIDE 0.06 % NA SOLN: 2.0000 | Freq: Three times a day (TID) | NASAL | 5 refills | Status: AC | PRN

## 2024-06-01 MED ORDER — LEVOCETIRIZINE DIHYDROCHLORIDE 5 MG PO TABS: 5.0000 mg | ORAL_TABLET | Freq: Every evening | ORAL | 5 refills | Status: AC

## 2024-06-01 MED ORDER — AZELASTINE HCL 0.1 % NA SOLN: 2.0000 | Freq: Two times a day (BID) | NASAL | 5 refills | Status: AC | PRN

## 2024-06-01 MED ORDER — EPINEPHRINE 0.3 MG/0.3ML IJ SOAJ: 0.3000 mg | INTRAMUSCULAR | 2 refills | Status: AC | PRN

## 2024-06-01 NOTE — Addendum Note (Signed)
 Addended by: AZALEA, Anvita Hirata on: 06/01/2024 02:22 PM   Modules accepted: Orders

## 2024-06-01 NOTE — Progress Notes (Signed)
 FOLLOW UP Date of Service/Encounter:  06/01/24   Subjective:  [name] (DOB: 01-02-1971) is a 53 y.o. female who [facility] for follow up for asthma and allergic rhinitis.   History obtained from: chart review and patient. Last visit was on 11/29/2023 with improvement in AR since starting AIT.  Asthma doing well on Singulair  and PRN Symbicort .   Asthma has done well since last visit. Denies much trouble with chronic cough/dyspnea.  Only has had to use Albuterol  a few times since last visit and no Symbicort  use. No ER/urgent care visits/oral prednisone  since last visit.  Allergies are better too but still with some congestion/drainage.  No issues with AIT.  Has an Epipen .  Using Azelastine /Ipratropium PRN, not much of Nasonex/Nasacort.  Also taking Singulair  and Xyzal  daily.   Past Medical History: Past Medical History:  Diagnosis Date   Allergy     Anxiety    Asthma    Bipolar 1 disorder (HCC)    Chronic lower back pain 02/07/2014   Depression    excessive daytime sleepiness-MSLT confirmed pathological degree of hypersomnia-12/13   Eczema    Esophageal reflux    Gastroparesis    Hypersomnia    Hypertension    Medication monitoring encounter 02/07/2014   Migraine    Migraine    Migraines    Narcolepsy    OCD (obsessive compulsive disorder)    Opiate use 07/02/2015   Osteoarthritis    knees, hands, shoulder, lower back   Other iron deficiency anemias 08/28/2015   Overactive bladder    Psychosis (HCC)    Restless leg syndrome    S/P lumbar fusion 09/06/2009   Schizophrenia (HCC)    Schizophrenia, schizo-affective (HCC)    Seasonal allergies    Sleep apnea    uses CPAP every night   Torn rotator cuff it:2012,rt:2013    Objective:  BP 122/78 (BP Location: Right Arm, Patient Position: Sitting, Cuff Size: Normal)   Pulse 93   Temp 98 F (36.7 C) (Temporal)   Ht 5' 4 (1.626 m)   Wt 195 lb 3.2 oz (88.5 kg)    SpO2 99%   BMI 33.51 kg/m  Body mass index is 33.51 kg/m. Physical Exam: GEN: alert, well developed HEENT: clear conjunctiva, nose with mild inferior turbinate hypertrophy, pink nasal mucosa, slight clear rhinorrhea, + cobblestoning HEART: regular rate and rhythm, no murmur LUNGS: clear to auscultation bilaterally, no coughing, unlabored respiration SKIN: no rashes or lesions  Spirometry:  Tracings reviewed. Her effort: Good reproducible efforts. FVC: 2.73L, 99% predicted  FEV1: 2.01L, 91% predicted FEV1/FVC ratio: 74% Interpretation: Spirometry consistent with normal pattern.  Please see scanned spirometry results for details.  Assessment:   1. Seasonal and perennial allergic rhinitis   2. Mild persistent asthma without complication     Plan/Recommendations:  Allergic Rhinitis: - Improved, continue AIT.  Will do nasal sprays PRN.   - SPT 07/2023: trees, grasses, weeds, cats, dogs, cockroach, dust mites, molds  - Use nasal saline rinses before nose sprays such as with Neilmed Sinus Rinse.  Use distilled water.   - Use Azelastine  2 sprays each nostril twice daily as needed for congestion, drainage, runny nose. Aim upward and outward. - Use Ipratroprium 1-2 sprays up to three times daily as needed for runny nose/drainage. Aim upward and outward. - Use Xyzal  5mg  daily.  - Use Singulair  10mg  daily.  Stop if there are any mood/behavioral changes. - Continue allergy  shots on schedule.  Keep Epipen .  Initiated 09/2023. Red vial 02/2024.    Mild Persistent Asthma: - Well controlled with normal spirometry. MDI technique discussed.  - Maintenance inhaler: Continue Singulair  10mg  daily.   - Use Symbicort  80-4.5mcg 2 puffs every 4-6 hours as needed for respiratory symptoms of cough, shortness of breath, or wheezing. Maximum 12 puffs.   Asthma control goals:  Full participation in all desired activities (may need albuterol  before activity) Albuterol  use two times or less a week on average  (not counting use with activity) Cough interfering with sleep two times or less a month Oral steroids no more than once a year No hospitalizations    Return in about 6 months (around 11/29/2024).  Arleta Blanch, MD [facility] 

## 2024-06-01 NOTE — Patient Instructions (Addendum)
 Allergic Rhinitis: - SPT 07/2023: trees, grasses, weeds, cats, dogs, cockroach, dust mites, molds  - Use nasal saline rinses before nose sprays such as with Neilmed Sinus Rinse.  Use distilled water.   - Use Azelastine  2 sprays each nostril twice daily as needed for congestion, drainage, runny nose. Aim upward and outward. - Use Ipratroprium 1-2 sprays up to three times daily as needed for runny nose/drainage. Aim upward and outward. - Use Xyzal  5mg  daily.  - Use Singulair  10mg  daily.  Stop if there are any mood/behavioral changes. - Continue allergy  shots on schedule.  Keep Epipen .  Initiated 09/2023. Red vial 02/2024.    Mild Persistent Asthma: - Maintenance inhaler: Continue Singulair  10mg  daily.   - Use Symbicort  80-4.62mcg 2 puffs every 4-6 hours as needed for respiratory symptoms of cough, shortness of breath, or wheezing. Maximum 12 puffs.   Asthma control goals:  Full participation in all desired activities (may need albuterol  before activity) Albuterol  use two times or less a week on average (not counting use with activity) Cough interfering with sleep two times or less a month Oral steroids no more than once a year No hospitalizations

## 2024-06-12 ENCOUNTER — Ambulatory Visit (INDEPENDENT_AMBULATORY_CARE_PROVIDER_SITE_OTHER)

## 2024-06-12 DIAGNOSIS — J309 Allergic rhinitis, unspecified: Secondary | ICD-10-CM

## 2024-06-13 DIAGNOSIS — J302 Other seasonal allergic rhinitis: Secondary | ICD-10-CM | POA: Diagnosis not present

## 2024-06-13 DIAGNOSIS — J3089 Other allergic rhinitis: Secondary | ICD-10-CM | POA: Diagnosis not present

## 2024-06-13 DIAGNOSIS — J301 Allergic rhinitis due to pollen: Secondary | ICD-10-CM | POA: Diagnosis not present

## 2024-06-13 DIAGNOSIS — J3081 Allergic rhinitis due to animal (cat) (dog) hair and dander: Secondary | ICD-10-CM | POA: Diagnosis not present

## 2024-06-13 NOTE — Progress Notes (Signed)
 VIAL MADE 06-13-24

## 2024-06-20 ENCOUNTER — Ambulatory Visit: Admitting: Hematology and Oncology

## 2024-06-21 ENCOUNTER — Telehealth: Payer: Self-pay

## 2024-06-21 NOTE — Telephone Encounter (Signed)
 Spoke with patient and confirmed appointment on 10/17

## 2024-06-22 ENCOUNTER — Inpatient Hospital Stay: Attending: Hematology and Oncology | Admitting: Hematology and Oncology

## 2024-06-22 VITALS — BP 150/101 | HR 83 | Temp 98.0°F | Resp 18 | Ht 64.0 in | Wt 196.2 lb

## 2024-06-22 DIAGNOSIS — Z9189 Other specified personal risk factors, not elsewhere classified: Secondary | ICD-10-CM | POA: Diagnosis not present

## 2024-06-22 DIAGNOSIS — Z1501 Genetic susceptibility to malignant neoplasm of breast: Secondary | ICD-10-CM | POA: Diagnosis present

## 2024-06-22 DIAGNOSIS — Z803 Family history of malignant neoplasm of breast: Secondary | ICD-10-CM | POA: Insufficient documentation

## 2024-06-22 NOTE — Progress Notes (Signed)
 [facility] CONSULT NOTE  Patient Care Team: Vernon Velna SAUNDERS, MD as PCP - General (Internal Medicine)  CHIEF COMPLAINTS/PURPOSE OF CONSULTATION:  At high risk for Breast cancer  ASSESSMENT & PLAN:   Assessment & Plan At high risk for breast cancer Genetic testing neg Life time risk of BC per TC model is 15%, not eligible for intensive screening. Ongoing surveillance for breast cancer risk  with annual mammograms No concerns on rbeast exam today  - Continue regular mammogram surveillance with next mammogram in February. - Instructed to report any new breast changes immediately.  Leukocytosis, resolved.  Courtney Stalls MD   HISTORY OF PRESENTING ILLNESS:  [name] 53 y.o. female is here because of increased risk for breast cancer  Discussed the use of AI scribe software for clinical note transcription with the patient, who gave verbal consent to proceed.  History of Present Illness She has a significant family history of breast cancer, including her mother, sister, and three maternal aunts. Her mother was diagnosed in her fifties and her sister in her forties. Her mother is currently 7 years old and had a mastectomy. Her sister, who is now 37, tested negative for BRCA mutations. She has not undergone genetic testing for BRCA1 and BRCA2.  Her family history includes a father with prostate cancer and a maternal grandmother who had kidney cancer. She has three sisters, with only the oldest having had breast cancer. She also has a son and a surviving daughter, with another daughter deceased due to suicide.   HPI  [name] is a 53 year old female who presents for follow-up after knee replacement surgery and breast cancer risk assessment.  She is currently in the recovery phase following knee replacement surgery. The initial months post-surgery were challenging, described as 'rough', but she is now experiencing improvement and feels she is on the  path to recovery. She uses a copper brace on her other knee, which is in poor condition, to manage symptoms. Significant pain was present initially, but it has changed in nature and is now more manageable.  She has been undergoing regular mammograms as part of her breast cancer risk assessment. Her last mammogram was in January at [facility], and she has been having scans every six months due to a previously identified mass that resolved before a biopsy could be performed. Her next mammogram is scheduled for February. No new breast masses or changes have been reported.  She was previously investigated for an increased white blood cell count, which had normalized by July. No new infections, changes in breathing, blood in stool, black stool, syncope episodes, or headaches have been noted.    MEDICAL HISTORY:  Past Medical History:  Diagnosis Date   Allergy     Anxiety    Asthma    Bipolar 1 disorder (HCC)    Chronic lower back pain 02/07/2014   Depression    excessive daytime sleepiness-MSLT confirmed pathological degree of hypersomnia-12/13   Eczema    Esophageal reflux    Gastroparesis    Hypersomnia    Hypertension    Medication monitoring encounter 02/07/2014   Migraine    Migraine    Migraines    Narcolepsy    OCD (obsessive compulsive disorder)    Opiate use 07/02/2015   Osteoarthritis    knees, hands, shoulder, lower back   Other iron deficiency anemias 08/28/2015   Overactive bladder    Psychosis (HCC)    Restless leg syndrome  S/P lumbar fusion 09/06/2009   Schizophrenia (HCC)    Schizophrenia, schizo-affective (HCC)    Seasonal allergies    Sleep apnea    uses CPAP every night   Torn rotator cuff it:2012,rt:2013    SURGICAL HISTORY: Past Surgical History:  Procedure Laterality Date   ABDOMINAL HYSTERECTOMY  09/07/1999   ANKLE FUSION Right 2000   right   CHOLECYSTECTOMY N/A 07/20/2013   Procedure: LAPAROSCOPIC CHOLECYSTECTOMY;  Surgeon: Vicenta DELENA Poli, MD;   Location: [facility] OR;  Service: General;  Laterality: N/A;   COLONOSCOPY WITH PROPOFOL  N/A 10/02/2013   Procedure: COLONOSCOPY WITH PROPOFOL ;  Surgeon: Gladis MARLA Louder, MD;  Location: [facility] ENDOSCOPY;  Service: Endoscopy;  Laterality: N/A;   ESOPHAGOGASTRODUODENOSCOPY (EGD) WITH PROPOFOL  N/A 10/02/2013   Procedure: ESOPHAGOGASTRODUODENOSCOPY (EGD) WITH PROPOFOL ;  Surgeon: Gladis MARLA Louder, MD;  Location: [facility] ENDOSCOPY;  Service: Endoscopy;  Laterality: N/A;   FLEXIBLE SIGMOIDOSCOPY Left 04/07/2015   Procedure: FLEXIBLE SIGMOIDOSCOPY;  Surgeon: Lamar Bunk, MD;  Location: [facility] ENDOSCOPY;  Service: Endoscopy;  Laterality: Left;   FRACTURE SURGERY     LAPAROSCOPIC SALPINGO OOPHERECTOMY Left 12/05/2013   Procedure: LAPAROSCOPIC SALPINGO OOPHORECTOMY;  Surgeon: Hargis Paradise, MD;  Location: [facility] ORS;  Service: Gynecology;  Laterality: Left;   LUMBAR FUSION  09/06/2009   L4-L5   OOPHORECTOMY  09/06/2002   right   ROTATOR CUFF REPAIR  2012,2013   torn    SHOULDER SURGERY     left and right , bone spurs, torn muscle   SPINE SURGERY     spurs,ruptured disc bone  09/07/2007   TOTAL KNEE ARTHROPLASTY Right 04/06/2024   Procedure: ARTHROPLASTY, KNEE, TOTAL;  Surgeon: Kay Kemps, MD;  Location: [facility] ORS;  Service: Orthopedics;  Laterality: Right;   TUBAL LIGATION  09/06/2002   VESICOVAGINAL FISTULA CLOSURE W/ TAH  09/07/1999   WISDOM TOOTH EXTRACTION      SOCIAL HISTORY: Social History   Socioeconomic History   Marital status: Married    Spouse name: Curtistine   Number of children: 3   Years of education: Master's   Highest education level: Not on file  Occupational History   Occupation: unemployed    Associate Professor: UNEMPLOYED  Tobacco Use   Smoking status: Never    Passive exposure: Never   Smokeless tobacco: Never  Vaping Use   Vaping status: Never Used  Substance and Sexual Activity   Alcohol use: No   Drug use: No   Sexual activity: Yes    Birth control/protection: Surgical  Other Topics  Concern   Not on file  Social History Narrative    This patient has an RDI of 12 and AHI of 4.8,  titrated to 6 cm water after PSG, she did qualify for MSLT, Epworth 22 today on 6 cm water.      Based on her MSLT  of 3.6 minutes without SREM and Epworth of 22 on CPAP with residual AHI of 1.8, she should be treated  in a trial base for narcolepsy. She is on many REM supressant medications, and may not be able to  express the narcolepsy in an MSLT.   Patient is married Rheba) and lives at home with her husband and three children.   Patient is currently unemployed.   Patient has a Master's degree.   Patient is right-handed.   Patient drinks two cups of coffee on average but none lately for 6 months, 3 cups of tea daily.   Social Drivers of Corporate investment banker Strain: Not on file  Food Insecurity: Medium Risk (10/05/2023)   Received from [facility]   Hunger Vital Sign    Within the past 12 months, you worried that your food would run out before you got money to buy more: Sometimes true    Within the past 12 months, the food you bought just didn't last and you didn't have money to get more. : Sometimes true  Transportation Needs: No Transportation Needs (10/05/2023)   Received from [facility]    In the past 12 months, has lack of reliable transportation kept you from medical appointments, meetings, work or from getting things needed for daily living? : No  Physical Activity: Not on file  Stress: Not on file  Social Connections: Not on file  Intimate Partner Violence: Not on file    FAMILY HISTORY: Family History  Problem Relation Age of Onset   Breast cancer Mother 44 - 2   Prostate cancer Father 79 - 34       metastatic   Hypertension Father    Thyroid  disease Father    High Cholesterol Father    Colon cancer Father 28 - 49   Eczema Sister    Asthma Sister    Thyroid  disease Sister    Breast cancer Sister 68   Breast cancer Maternal Aunt 30 -  39   Breast cancer Maternal Aunt 61 - 53   Breast cancer Maternal Aunt 69 - 53   Lung cancer Maternal Aunt        smoked   Colon cancer Paternal Aunt 44 - 65   Lung cancer Paternal Aunt        did not smoke   Kidney cancer Maternal Grandmother 23 - 71   Asthma Maternal Grandfather    Colon cancer Paternal Grandmother 63 - 57   Breast cancer Cousin        3 maternal first cousins   Ovarian cancer Cousin        maternal first cousin   Breast cancer Cousin        paternal first cousin   Narcolepsy Neg Hx     ALLERGIES:  is allergic to tramadol , naproxen sodium, adhesive [tape], aspirin , benadryl [diphenhydramine hcl], codeine, dust mite extract, metoclopramide , mold extract [trichophyton], molds & smuts, pollen extract, prednisone , toradol  [ketorolac  tromethamine ], and zofran  [ondansetron ].  MEDICATIONS:  Current Outpatient Medications  Medication Sig Dispense Refill   acyclovir ointment (ZOVIRAX) 5 % Apply 1 Application topically every 3 (three) hours as needed (fever blisters/cold sores.).     amphetamine -dextroamphetamine  (ADDERALL XR) 20 MG 24 hr capsule Take 20 mg by mouth in the morning and at bedtime.     atorvastatin (LIPITOR) 40 MG tablet Take 40 mg by mouth in the morning.     azelastine  (ASTELIN ) 0.1 % nasal spray Place 2 sprays into both nostrils 2 (two) times daily as needed for rhinitis. Use in each nostril as directed 30 mL 5   budesonide -formoterol  (SYMBICORT ) 80-4.5 MCG/ACT inhaler Use 2 puffs every 4-6 hours as needed for wheezing or shortness of breath.  Maximum 12 puffs in a day. 1 each 5   busPIRone (BUSPAR) 7.5 MG tablet Take 7.5 mg by mouth 2 (two) times daily.     Cholecalciferol (TRUE VITAMIN D3) 1.25 MG (50000 UT) capsule Take 50,000 Units by mouth every Saturday.     clotrimazole-betamethasone (LOTRISONE) cream Apply 1 Application topically 2 (two) times daily as needed (skin irritation.).     colestipol (COLESTID) 1 g tablet Take  1 g by mouth daily. Hold for  constipation.     CYMBALTA 60 MG capsule Take 60 mg by mouth in the morning.     diclofenac  (VOLTAREN ) 50 MG EC tablet Take 1 tablet (50 mg total) by mouth 2 (two) times daily. 60 tablet 0   dicyclomine  (BENTYL ) 10 MG capsule Take 10 mg by mouth 4 (four) times daily as needed for spasms.     dronabinol  (MARINOL ) 2.5 MG capsule Take 2.5 mg by mouth in the morning.     EPINEPHrine  0.3 mg/0.3 mL IJ SOAJ injection Inject 0.3 mg into the muscle as needed for anaphylaxis. 0.3 mL 2   estradiol  (ESTRACE ) 1 MG tablet Take 1 mg by mouth in the morning.     famotidine  (PEPCID ) 40 MG tablet Take 40 mg by mouth in the morning and at bedtime.     fluocinonide (LIDEX) 0.05 % cream Apply 1 application  topically daily as needed (skin irritation).     gabapentin  (NEURONTIN ) 300 MG capsule Take 1 capsule (300 mg total) by mouth 3 (three) times daily. 90 capsule 2   hydrochlorothiazide  (HYDRODIURIL ) 25 MG tablet Take 25 mg by mouth every morning.      ipratropium (ATROVENT ) 0.06 % nasal spray Place 2 sprays into both nostrils 3 (three) times daily as needed for rhinitis. 15 mL 5   levocetirizine (XYZAL ) 5 MG tablet Take 1 tablet (5 mg total) by mouth every evening. 30 tablet 5   meclizine (ANTIVERT) 12.5 MG tablet Take 12.5 mg by mouth 3 (three) times daily as needed for dizziness.     montelukast  (SINGULAIR ) 10 MG tablet Take 1 tablet (10 mg total) by mouth daily. 30 tablet 5   NON FORMULARY Allergy  shots weeks     NURTEC 75 MG TBDP Take 75 mg by mouth daily as needed (migraines).     OnabotulinumtoxinA (BOTOX IJ) Inject 1 Dose as directed every 3 (three) months.     Polyethylene Glycol 400 (BLINK TEARS) 0.25 % SOLN Place 1-2 drops into both eyes 3 (three) times daily as needed (dry/irritated eyes.).     pramipexole  (MIRAPEX ) 1 MG tablet Take 2-3 mg by mouth at bedtime.  0   promethazine  (PHENERGAN ) 25 MG tablet Take 25 mg by mouth every 6 (six) hours as needed for nausea or vomiting.  3   PROMETHEGAN 25 MG  suppository Place 25 mg rectally every 6 (six) hours as needed for nausea or vomiting.  11   scopolamine (TRANSDERM-SCOP) 1 MG/3DAYS Place 1 patch onto the skin every three (3) days as needed (dizziness/migraines.).     sucralfate (CARAFATE) 1 g tablet Take 1 g by mouth 4 (four) times daily as needed (stomach upset).     tiZANidine  (ZANAFLEX ) 4 MG tablet Take 1 tablet (4 mg total) by mouth every 8 (eight) hours as needed for muscle spasms. 60 tablet 0   topiramate  (TOPAMAX ) 100 MG tablet Take 100 mg by mouth in the morning and at bedtime.     traZODone (DESYREL) 50 MG tablet Take 100 mg by mouth at bedtime as needed for sleep.     triamcinolone  cream (KENALOG) 0.1 % Apply 1 application  topically daily as needed (skin irritation.).     valACYclovir (VALTREX) 1000 MG tablet Take 1,000 mg by mouth in the morning.     verapamil  (VERELAN  PM) 240 MG 24 hr capsule Take 240 mg by mouth in the morning.     No current facility-administered medications for this visit.  PHYSICAL EXAMINATION: ECOG PERFORMANCE STATUS: 0 - Asymptomatic  Vitals:   06/22/24 0918  BP: (!) 150/101  Pulse: 83  Resp: 18  Temp: 98 F (36.7 C)  SpO2: 100%    Filed Weights   06/22/24 0918  Weight: 196 lb 3.2 oz (89 kg)     GENERAL:alert, no distress and comfortable Bilateral breast exam, no concerns, no masses or regional adenopathy PSYCH: alert & oriented x 3 with fluent speech NEURO: no focal motor/sensory deficits  LABORATORY DATA:  I have reviewed the data as listed Lab Results  Component Value Date   WBC 9.8 03/27/2024   HGB 13.7 03/27/2024   HCT 39.9 03/27/2024   MCV 81.4 03/27/2024   PLT 388 03/27/2024     Chemistry      Component Value Date/Time   NA 136 03/27/2024 1330   NA 143 08/28/2015 1557   K 3.7 03/27/2024 1330   CL 101 03/27/2024 1330   CO2 25 03/27/2024 1330   BUN 18 03/27/2024 1330   BUN 7 08/28/2015 1557   CREATININE 0.95 03/27/2024 1330      Component Value Date/Time    CALCIUM 9.1 03/27/2024 1330   ALKPHOS 61 11/14/2016 1542   AST 21 11/14/2016 1542   ALT 19 11/14/2016 1542   BILITOT 0.7 11/14/2016 1542   BILITOT 0.2 08/28/2015 1557       RADIOGRAPHIC STUDIES: I have personally reviewed the radiological images as listed and agreed with the findings in the report. No results found.  All questions were answered. The patient knows to call the clinic with any problems, questions or concerns. I spent 20 minutes in the care of this patient including H and P, review of records, counseling and coordination of care.     Courtney Stalls, MD 06/22/2024 9:39 AM

## 2024-06-27 ENCOUNTER — Ambulatory Visit

## 2024-06-27 DIAGNOSIS — J309 Allergic rhinitis, unspecified: Secondary | ICD-10-CM

## 2024-06-29 NOTE — Progress Notes (Signed)
 [name]                                          MRN: [id number]   06/29/2024   The [facility] Quality Team Specialist reviewed this patient medical record for the purposes of chart review for care gap closure. The following were reviewed: chart review for care gap closure-controlling blood pressure.    [facility] Quality Team

## 2024-07-03 ENCOUNTER — Ambulatory Visit (INDEPENDENT_AMBULATORY_CARE_PROVIDER_SITE_OTHER)

## 2024-07-03 DIAGNOSIS — J309 Allergic rhinitis, unspecified: Secondary | ICD-10-CM

## 2024-07-10 ENCOUNTER — Ambulatory Visit

## 2024-07-10 DIAGNOSIS — J309 Allergic rhinitis, unspecified: Secondary | ICD-10-CM

## 2024-08-20 ENCOUNTER — Ambulatory Visit

## 2024-08-20 DIAGNOSIS — J309 Allergic rhinitis, unspecified: Secondary | ICD-10-CM

## 2024-09-20 ENCOUNTER — Ambulatory Visit

## 2024-09-20 DIAGNOSIS — J302 Other seasonal allergic rhinitis: Secondary | ICD-10-CM

## 2024-11-30 ENCOUNTER — Ambulatory Visit: Admitting: Allergy

## 2024-11-30 ENCOUNTER — Ambulatory Visit: Admitting: Internal Medicine

## 2025-06-21 ENCOUNTER — Inpatient Hospital Stay: Admitting: Hematology and Oncology
